# Patient Record
Sex: Male | Born: 1975 | Race: White | Hispanic: No | Marital: Married | Smoking: Current every day smoker
Health system: Southern US, Community
[De-identification: ages and names within clinical notes are randomized; demographics above are authoritative.]

## PROBLEM LIST (undated history)

## (undated) DIAGNOSIS — I509 Heart failure, unspecified: Secondary | ICD-10-CM

## (undated) DIAGNOSIS — G4730 Sleep apnea, unspecified: Secondary | ICD-10-CM

## (undated) DIAGNOSIS — I429 Cardiomyopathy, unspecified: Secondary | ICD-10-CM

## (undated) DIAGNOSIS — E890 Postprocedural hypothyroidism: Secondary | ICD-10-CM

## (undated) DIAGNOSIS — E7800 Pure hypercholesterolemia, unspecified: Secondary | ICD-10-CM

## (undated) DIAGNOSIS — I2510 Atherosclerotic heart disease of native coronary artery without angina pectoris: Secondary | ICD-10-CM

## (undated) DIAGNOSIS — E119 Type 2 diabetes mellitus without complications: Secondary | ICD-10-CM

## (undated) DIAGNOSIS — M109 Gout, unspecified: Secondary | ICD-10-CM

## (undated) DIAGNOSIS — E559 Vitamin D deficiency, unspecified: Secondary | ICD-10-CM

## (undated) DIAGNOSIS — J189 Pneumonia, unspecified organism: Secondary | ICD-10-CM

## (undated) DIAGNOSIS — T8859XA Other complications of anesthesia, initial encounter: Secondary | ICD-10-CM

## (undated) DIAGNOSIS — M1990 Unspecified osteoarthritis, unspecified site: Secondary | ICD-10-CM

## (undated) DIAGNOSIS — I10 Essential (primary) hypertension: Secondary | ICD-10-CM

## (undated) DIAGNOSIS — C801 Malignant (primary) neoplasm, unspecified: Secondary | ICD-10-CM

## (undated) DIAGNOSIS — E079 Disorder of thyroid, unspecified: Secondary | ICD-10-CM

## (undated) HISTORY — DX: Postprocedural hypothyroidism: E89.0

## (undated) HISTORY — DX: Cardiomyopathy, unspecified: I42.9

## (undated) HISTORY — DX: Disorder of thyroid, unspecified: E07.9

## (undated) HISTORY — PX: EYE SURGERY: SHX253

## (undated) HISTORY — DX: Vitamin D deficiency, unspecified: E55.9

## (undated) HISTORY — DX: Type 2 diabetes mellitus without complications: E11.9

## (undated) HISTORY — PX: TONSILLECTOMY: SUR1361

---
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
## 2012-11-25 ENCOUNTER — Ambulatory Visit: Payer: Self-pay | Admitting: Family Medicine

## 2012-11-29 ENCOUNTER — Other Ambulatory Visit: Payer: Self-pay | Admitting: *Deleted

## 2012-11-29 MED ORDER — ALLOPURINOL 300 MG PO TABS: 300.0000 mg | ORAL_TABLET | Freq: Every day | ORAL | Status: DC

## 2012-11-29 MED ORDER — ATORVASTATIN CALCIUM 20 MG PO TABS: 20.0000 mg | ORAL_TABLET | Freq: Every day | ORAL | Status: DC

## 2012-12-20 ENCOUNTER — Inpatient Hospital Stay (HOSPITAL_COMMUNITY): Payer: BC Managed Care – PPO

## 2012-12-20 ENCOUNTER — Inpatient Hospital Stay (HOSPITAL_COMMUNITY)
Admission: EM | Admit: 2012-12-20 | Discharge: 2012-12-22 | DRG: 316 | Disposition: A | Discharge status: Home / Self Care | Payer: BC Managed Care – PPO | Attending: Internal Medicine | Admitting: Internal Medicine

## 2012-12-20 ENCOUNTER — Encounter (HOSPITAL_COMMUNITY): Payer: Self-pay | Admitting: *Deleted

## 2012-12-20 DIAGNOSIS — J398 Other specified diseases of upper respiratory tract: Secondary | ICD-10-CM | POA: Diagnosis present

## 2012-12-20 DIAGNOSIS — R7309 Other abnormal glucose: Secondary | ICD-10-CM | POA: Diagnosis present

## 2012-12-20 DIAGNOSIS — Z6841 Body Mass Index (BMI) 40.0 and over, adult: Secondary | ICD-10-CM

## 2012-12-20 DIAGNOSIS — M109 Gout, unspecified: Secondary | ICD-10-CM | POA: Diagnosis present

## 2012-12-20 DIAGNOSIS — R739 Hyperglycemia, unspecified: Secondary | ICD-10-CM | POA: Diagnosis present

## 2012-12-20 DIAGNOSIS — E872 Acidosis, unspecified: Secondary | ICD-10-CM | POA: Diagnosis present

## 2012-12-20 DIAGNOSIS — IMO0001 Reserved for inherently not codable concepts without codable children: Secondary | ICD-10-CM | POA: Diagnosis present

## 2012-12-20 DIAGNOSIS — E041 Nontoxic single thyroid nodule: Secondary | ICD-10-CM | POA: Diagnosis present

## 2012-12-20 DIAGNOSIS — E0789 Other specified disorders of thyroid: Secondary | ICD-10-CM | POA: Diagnosis present

## 2012-12-20 DIAGNOSIS — E860 Dehydration: Secondary | ICD-10-CM | POA: Diagnosis present

## 2012-12-20 DIAGNOSIS — Z720 Tobacco use: Secondary | ICD-10-CM

## 2012-12-20 DIAGNOSIS — F17200 Nicotine dependence, unspecified, uncomplicated: Secondary | ICD-10-CM | POA: Diagnosis present

## 2012-12-20 DIAGNOSIS — I959 Hypotension, unspecified: Secondary | ICD-10-CM | POA: Diagnosis present

## 2012-12-20 DIAGNOSIS — D72829 Elevated white blood cell count, unspecified: Secondary | ICD-10-CM | POA: Diagnosis present

## 2012-12-20 DIAGNOSIS — N179 Acute kidney failure, unspecified: Principal | ICD-10-CM | POA: Diagnosis present

## 2012-12-20 DIAGNOSIS — E7800 Pure hypercholesterolemia, unspecified: Secondary | ICD-10-CM | POA: Diagnosis present

## 2012-12-20 DIAGNOSIS — E042 Nontoxic multinodular goiter: Secondary | ICD-10-CM

## 2012-12-20 DIAGNOSIS — Z808 Family history of malignant neoplasm of other organs or systems: Secondary | ICD-10-CM

## 2012-12-20 HISTORY — DX: Pure hypercholesterolemia, unspecified: E78.00

## 2012-12-20 HISTORY — DX: Gout, unspecified: M10.9

## 2012-12-20 LAB — COMPREHENSIVE METABOLIC PANEL
AST: 42 U/L — ABNORMAL HIGH (ref 0–37)
BUN: 21 mg/dL (ref 6–23)
Calcium: 13.4 mg/dL (ref 8.4–10.5)
Creatinine, Ser: 3.07 mg/dL — ABNORMAL HIGH (ref 0.50–1.35)
GFR calc Af Amer: 29 mL/min — ABNORMAL LOW (ref >90)
GFR calc non Af Amer: 25 mL/min — ABNORMAL LOW (ref >90)
Potassium: 4.2 mEq/L (ref 3.5–5.1)
Sodium: 138 mEq/L (ref 135–145)
Total Bilirubin: 1.1 mg/dL (ref 0.3–1.2)

## 2012-12-20 LAB — URINE MICROSCOPIC-ADD ON

## 2012-12-20 LAB — URINALYSIS, ROUTINE W REFLEX MICROSCOPIC
Bilirubin Urine: NEGATIVE
Nitrite: NEGATIVE
Protein, ur: 100 mg/dL — AB
pH: 5.5 (ref 5.0–8.0)

## 2012-12-20 LAB — CBC WITH DIFFERENTIAL/PLATELET
Basophils Relative: 0 % (ref 0–1)
Eosinophils Absolute: 0 10*3/uL (ref 0.0–0.7)
MCH: 33 pg (ref 26.0–34.0)
MCHC: 37 g/dL — ABNORMAL HIGH (ref 30.0–36.0)
Neutrophils Relative %: 78 % — ABNORMAL HIGH (ref 43–77)
Platelets: 316 10*3/uL (ref 150–400)

## 2012-12-20 LAB — CK: Total CK: 164 U/L (ref 7–232)

## 2012-12-20 LAB — PRO B NATRIURETIC PEPTIDE: Pro B Natriuretic peptide (BNP): 176.8 pg/mL — ABNORMAL HIGH (ref 0–125)

## 2012-12-20 IMAGING — CR DG CHEST 1V PORT
1 series · 1 of 1 positions shown · non-contrast
Comparison: None

CLINICAL DATA: Shortness of breath

PORTABLE CHEST - 1 VIEW

[portable]
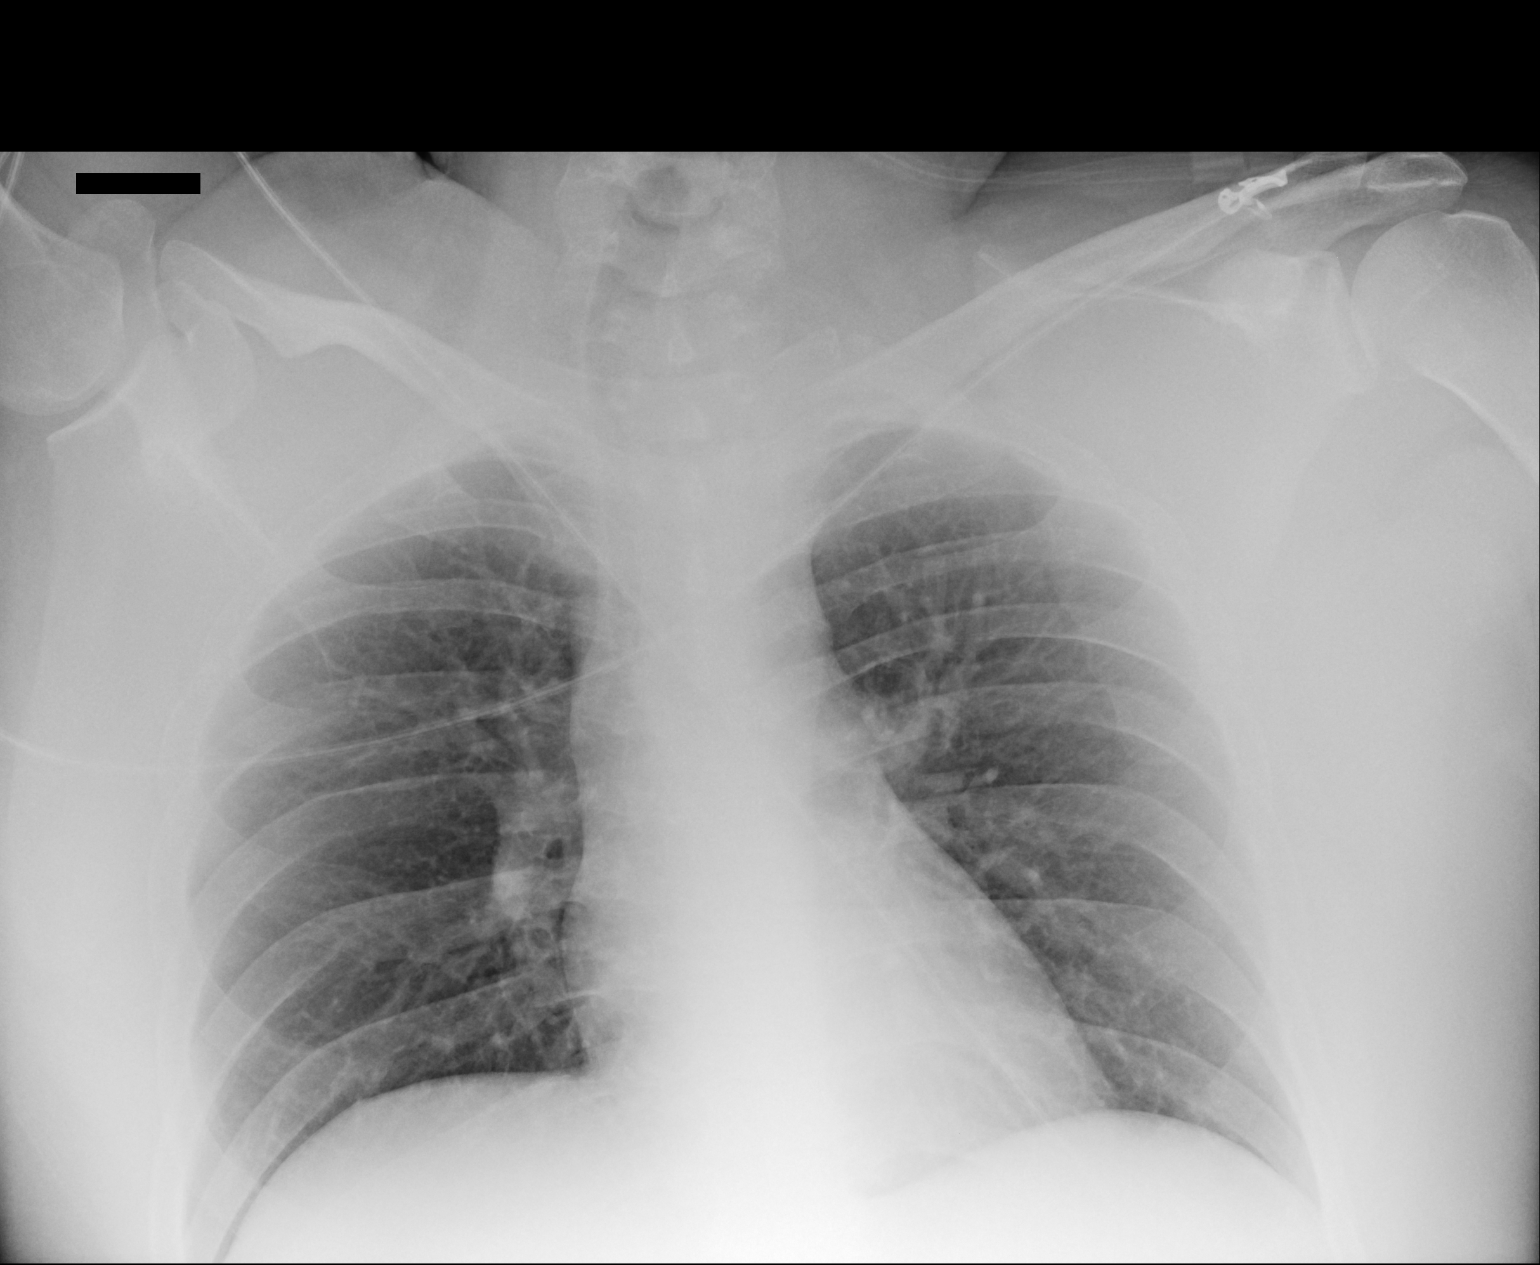

[1 of 1 positions shown; findings below may reference images not displayed]

FINDINGS: The heart, mediastinal, and hilar contours and pulmonary
vascularity are normal.  The right costophrenic angle is excluded
from the image.  The lungs are clear.  No visible pleural effusion.
Negative for pneumothorax.

The trachea is deviated to the right of midline.  Tracheal column
does not appear narrowed.
IMPRESSION: 1.  Deviation of the trachea to the right of midline.  There are no
prior studies for comparison.  Question the possibility of an
enlarged left thyroid lobe or other neck mass.
2.  No acute cardiopulmonary disease identified.

## 2012-12-20 MED ORDER — SODIUM CHLORIDE 0.9 % IV BOLUS (SEPSIS): 1000.0000 mL | Freq: Once | INTRAVENOUS | Status: DC

## 2012-12-20 MED ORDER — SODIUM CHLORIDE 0.9 % IV SOLN
1000.0000 mL | Freq: Once | INTRAVENOUS | Status: AC
  Administered 2012-12-20: 1000 mL via INTRAVENOUS

## 2012-12-20 MED ORDER — SODIUM CHLORIDE 0.9 % IV SOLN: INTRAVENOUS | Status: DC

## 2012-12-20 MED ORDER — SODIUM CHLORIDE 0.9 % IV BOLUS (SEPSIS)
1000.0000 mL | Freq: Once | INTRAVENOUS | Status: AC
  Administered 2012-12-20: 1000 mL via INTRAVENOUS

## 2012-12-20 MED ORDER — KETOROLAC TROMETHAMINE 30 MG/ML IJ SOLN
30.0000 mg | Freq: Once | INTRAMUSCULAR | Status: AC
  Administered 2012-12-20: 30 mg via INTRAVENOUS
  Filled 2012-12-20: qty 1

## 2012-12-20 MED ORDER — SODIUM BICARBONATE 8.4 % IV SOLN
INTRAVENOUS | Status: DC
  Administered 2012-12-20 – 2012-12-21 (×2): via INTRAVENOUS
  Filled 2012-12-20 (×9): qty 1000

## 2012-12-20 MED ORDER — SODIUM BICARBONATE 8.4 % IV SOLN
INTRAVENOUS | Status: AC
  Filled 2012-12-20: qty 150

## 2012-12-20 MED ORDER — SODIUM CHLORIDE 0.9 % IV SOLN
Freq: Once | INTRAVENOUS | Status: AC
  Administered 2012-12-20: 22:00:00 via INTRAVENOUS

## 2012-12-20 NOTE — ED Notes (Signed)
Pt c/o sob, cramping, emesis, and sob. Pt works for [employer]

## 2012-12-20 NOTE — ED Provider Notes (Signed)
History     This chart was scribed for Benny Lennert, MD, MD by Smitty Pluck, ED Scribe. The patient was seen in room APA06/APA06 and the patient's care was started at 6:07 PM.  CSN: [id number] Arrival date & time 12/20/12  1714    Chief Complaint  Patient presents with  . Shortness of Breath  . Abdominal Cramping  . Dizziness  . Emesis    Patient is a 37 y.o. male presenting with shortness of breath, cramps, and vomiting. The history is provided by the patient and medical records. No language interpreter was used.  Shortness of Breath Severity:  Moderate Duration:  3 hours Timing:  Constant Progression:  Unchanged Chronicity:  New Relieved by:  None tried Worsened by:  Nothing tried Ineffective treatments:  None tried Associated symptoms: vomiting   Associated symptoms: no abdominal pain, no chest pain, no cough, no headaches and no rash   Abdominal Cramping Associated symptoms include shortness of breath. Pertinent negatives include no chest pain, no abdominal pain and no headaches.  Emesis Associated symptoms: myalgias   Associated symptoms: no abdominal pain, no diarrhea and no headaches    HPI Comments: Tyler [name] is a 37 y.o. male who presents to the Emergency Department complaining of constant, moderate SOB with sudden onset 3 hours ago. He states that he was at work (working outside in 90 degree weather) when symptoms started. Pt reports having associated nausea, emesis and muscle cramping in right leg. Pt denies fever, chills, diarrhea, weakness, cough and any other pain.   PCP is Dr. Christell Constant   Past Medical History  Diagnosis Date  . High cholesterol   . Gout    Past Surgical History  Procedure Laterality Date  . Eye surgery     History reviewed. No pertinent family history. History  Substance Use Topics  . Smoking status: Current Every Day Smoker  . Smokeless tobacco: Not on file  . Alcohol Use: Yes    Review of Systems  Constitutional: Negative  for appetite change and fatigue.  HENT: Negative for congestion, sinus pressure and ear discharge.   Eyes: Negative for discharge.  Respiratory: Positive for shortness of breath. Negative for cough.   Cardiovascular: Negative for chest pain.  Gastrointestinal: Positive for nausea and vomiting. Negative for abdominal pain and diarrhea.  Genitourinary: Negative for frequency and hematuria.  Musculoskeletal: Positive for myalgias. Negative for back pain.  Skin: Negative for rash.  Neurological: Negative for seizures and headaches.  Psychiatric/Behavioral: Negative for hallucinations.    Allergies  Review of patient's allergies indicates no known allergies.  Home Medications   Current Outpatient Rx  Name  Route  Sig  Dispense  Refill  . allopurinol (ZYLOPRIM) 300 MG tablet   Oral   Take 1 tablet (300 mg total) by mouth daily.   30 tablet   0     Needs to be seen for 3 month follow up last seen 2 ...   . atorvastatin (LIPITOR) 20 MG tablet   Oral   Take 1 tablet (20 mg total) by mouth daily.   30 tablet   0     Needs to be seen for 3 month follow up last seen 2 ...    BP 79/49  Pulse 102  Temp(Src) 97.5 F (36.4 C)  Resp 21  Ht 5' 9.5" (1.765 m)  Wt 275 lb (124.739 kg)  BMI 40.04 kg/m2  SpO2 100% Physical Exam  Nursing note and vitals reviewed. Constitutional: He is oriented  to person, place, and time. He appears well-developed.  HENT:  Head: Normocephalic.  Eyes: Conjunctivae and EOM are normal. No scleral icterus.  Neck: Neck supple. No thyromegaly present.  Cardiovascular: Normal rate and regular rhythm.  Exam reveals no gallop and no friction rub.   No murmur heard. Pulmonary/Chest: No stridor. He has no wheezes. He has no rales. He exhibits no tenderness.  Abdominal: He exhibits no distension. There is no tenderness. There is no rebound.  Musculoskeletal: Normal range of motion. He exhibits no edema.  Lymphadenopathy:    He has no cervical adenopathy.   Neurological: He is oriented to person, place, and time. Coordination normal.  Skin: No rash noted. No erythema.  Psychiatric: He has a normal mood and affect. His behavior is normal.    ED Course  Procedures (including critical care time) DIAGNOSTIC STUDIES: Oxygen Saturation is 100% on [place], normal by my interpretation.    COORDINATION OF CARE: 6:10 PM Discussed ED treatment with pt and pt agrees.  Medications  0.9 %  sodium chloride infusion (0 mLs Intravenous Stopped 12/20/12 2013)    Followed by  0.9 %  sodium chloride infusion (0 mLs Intravenous Stopped 12/20/12 1935)  sodium chloride 0.9 % bolus 1,000 mL (0 mLs Intravenous Stopped 12/20/12 1935)  ketorolac (TORADOL) 30 MG/ML injection 30 mg (30 mg Intravenous Given 12/20/12 1819)    8:07 PM Recheck: Discussed lab results and treatment course with pt. Pt reports that he is feeling better. Will order CT neck w/o contrast due to chest xray results.    Labs Reviewed  CBC WITH DIFFERENTIAL - Abnormal; Notable for the following:    WBC 21.1 (*)    Hemoglobin 18.4 (*)    MCHC 37.0 (*)    Neutrophils Relative % 78 (*)    Neutro Abs 16.5 (*)    All other components within normal limits  COMPREHENSIVE METABOLIC PANEL - Abnormal; Notable for the following:    Chloride 91 (*)    Glucose, Bld 165 (*)    Creatinine, Ser 3.07 (*)    Calcium 13.4 (*)    Total Protein 10.6 (*)    Albumin 5.8 (*)    AST 42 (*)    ALT 74 (*)    Alkaline Phosphatase 121 (*)    GFR calc non Af Amer 25 (*)    GFR calc Af Amer 29 (*)    All other components within normal limits  PRO B NATRIURETIC PEPTIDE - Abnormal; Notable for the following:    Pro B Natriuretic peptide (BNP) 176.8 (*)    All other components within normal limits  GLUCOSE, CAPILLARY - Abnormal; Notable for the following:    Glucose-Capillary 160 (*)    All other components within normal limits  LACTIC ACID, PLASMA - Abnormal; Notable for the following:    Lactic Acid, Venous 4.6 (*)     All other components within normal limits  TROPONIN I  URINALYSIS, ROUTINE W REFLEX MICROSCOPIC   Ct Soft Tissue Neck Wo Contrast  12/20/2012   *RADIOLOGY REPORT*  Clinical Data: Shortness of breath.  Dehydration.  Deviation of the upper trachea to the right on a portable chest radiograph obtained earlier today, raising the possibility of an enlarged left thyroid lobe or other neck mass.  CT NECK WITHOUT CONTRAST  Technique:  Multidetector CT imaging of the neck was performed without intravenous contrast.  Comparison: Portable chest obtained earlier today.  Findings: Diffusely enlarged thyroid gland containing a small number of coarse calcifications.  The left lobe is significantly larger than the right lobe, measuring 7.0 x 5.3 cm on image number 77.  There is mild substernal extension on the left with tracheal deviation to the right.  There is also associated flattening of the trachea with mild airway narrowing.  No enlarged lymph nodes. Clear lung apices.  Unremarkable bones.  IMPRESSION: Large thyroid goiter with substernal extension on the left and tracheal deviation to the right with mild tracheal compression.   Original Report Authenticated By: Beckie Salts, M.D.   Dg Chest Portable 1 View  12/20/2012   *RADIOLOGY REPORT*  Clinical Data: Shortness of breath  PORTABLE CHEST - 1 VIEW  Comparison: None  Findings: The heart, mediastinal, and hilar contours and pulmonary vascularity are normal.  The right costophrenic angle is excluded from the image.  The lungs are clear.  No visible pleural effusion. Negative for pneumothorax.  The trachea is deviated to the right of midline.  Tracheal column does not appear narrowed.  IMPRESSION:  1.  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  There are no prior studies for comparison.  Question the possibility of an enlarged left thyroid lobe or other neck mass. 2.  No acute cardiopulmonary disease identified.   Original Report Authenticated By: Britta Mccreedy, M.D.    No diagnosis found.  Date: 12/20/2012  Rate:118  Rhythm: sinus tachycardia  QRS Axis: normal  Intervals: normal  ST/T Wave abnormalities: normal  Conduction Disutrbances:none  Narrative Interpretation:   Old EKG Reviewed: none available CRITICAL CARE Performed by: Shaka Zech L Total critical care time: 35  Critical care time was exclusive of separately billable procedures and treating other patients. Critical care was necessary to treat or prevent imminent or life-threatening deterioration. Critical care was time spent personally by me on the following activities: development of treatment plan with patient and/or surrogate as well as nursing, discussions with consultants, evaluation of patient's response to treatment, examination of patient, obtaining history from patient or surrogate, ordering and performing treatments and interventions, ordering and review of laboratory studies, ordering and review of radiographic studies, pulse oximetry and re-evaluation of patient's condition.    MDM   The chart was scribed for me under my direct supervision.  I personally performed the history, physical, and medical decision making and all procedures in the evaluation of this patient..'   Benny Lennert, MD 12/20/12 2153

## 2012-12-20 NOTE — ED Notes (Signed)
Lung fields clear prior to fluid administration.

## 2012-12-20 NOTE — H&P (Signed)
[facility] History and Physical  Tyler [name]  ZOX:[id number]  DOB: 1976/02/10   DOA: 12/20/2012   PCP:   [facility]  Chief Complaint:  Acute dizziness   HPI: Tyler [name] is a 37 y.o. male.  Obese Caucasian gentleman, works as a Copywriter, advertising for [employer], has been working out in the sun since 7 AM this morning at about 3 PM got suddenly very dizzy and had nausea and vomiting and was transported to the and the pain emergency room was found to be hypotensive extremely dehydrated and blood work confirmed extreme dehydration and acute renal failure. He received 4 L of IV fluids and the hospitalist service was called to assist with management   Rewiew of Systems:   All systems negative except as marked bold or noted in the HPI;  Constitutional:    malaise, fever and chills. ;  Eyes:   eye pain, redness and discharge. ;  ENMT:   ear pain, hoarseness, nasal congestion, sinus pressure and sore throat. ;  Cardiovascular:    chest pain, palpitations, diaphoresis, dyspnea and peripheral edema.  Respiratory:   cough, hemoptysis, wheezing and stridor. ;  Gastrointestinal:  nausea, vomiting, diarrhea, constipation, abdominal pain, melena, blood in stool, hematemesis, jaundice and rectal bleeding. unusual weight loss..   Genitourinary:    frequency, dysuria, incontinence,flank pain and hematuria; Musculoskeletal:   back pain and neck pain.  swelling and trauma.;  Skin: .  pruritus, rash, abrasions, bruising and skin lesion.; ulcerations Neuro:    headache, lightheadedness and neck stiffness.  weakness, altered level of consciousness, altered mental status, extremity weakness, burning feet, involuntary movement, seizure and syncope.  Psych:    anxiety, depression, insomnia, tearfulness, panic attacks, hallucinations, paranoia, suicidal or homicidal ideation    Past Medical History  Diagnosis Date  . High cholesterol   . Gout     Past Surgical History   Procedure Laterality Date  . Eye surgery      Medications:  HOME MEDS: Prior to Admission medications   Medication Sig Start Date End Date Taking? Authorizing Provider  allopurinol (ZYLOPRIM) 300 MG tablet Take 300 mg by mouth every morning. 11/29/12  Yes Ileana Ladd, MD  atorvastatin (LIPITOR) 20 MG tablet Take 20 mg by mouth every morning. 11/29/12  Yes Ileana Ladd, MD  fish oil-omega-3 fatty acids 1000 MG capsule Take 1 g by mouth daily.   Yes Historical Provider, MD     Allergies:  No Known Allergies  Social History:   reports that he has been smoking.  He does not have any smokeless tobacco history on file. He reports that  drinks alcohol. He reports that he does not use illicit drugs.  Family History: History reviewed. No pertinent family history.   Physical Exam: Filed Vitals:   12/20/12 2010 12/20/12 2015 12/20/12 2030 12/20/12 2045  BP: 136/97 133/90 130/78 141/93  Pulse: 86 88 85 89  Temp:      Resp: 16 14 17 17   Height:      Weight:      SpO2: 99% 99% 98% 98%   Blood pressure 141/93, pulse 89, temperature 97.5 F (36.4 C), resp. rate 17, height 5' 9.5" (1.765 m), weight 124.739 kg (275 lb), SpO2 98.00%. Body mass index is 40.04 kg/(m^2).   GEN:  Pleasant obese Caucasian gentleman lying bed in no acute distress; cooperative with exam PSYCH:  alert and oriented x4; ; affect is appropriate. HEENT: Mucous membranes pink and anicteric; PERRLA;  EOM intact;r large goiter thyromegaly Breasts:: Not examined CHEST WALL: No tenderness CHEST: Normal respiration, clear to auscultation bilaterally HEART: Regular rate and rhythm; no murmurs rubs or gallops BACK: Buffalo hump  no CVA tenderness ABDOMEN: Obese, soft non-tender; no masses, s; no intertriginous candida. Rectal Exam: Not done EXTREMITIES: No bone or joint deformity;  no edema; no ulcerations. Genitalia: not examined PULSES: 2+ and symmetric SKIN: Almost Sunburned skin CNS: Cranial nerves 2-12 grossly  intact no focal lateralizing neurologic deficit   Labs on Admission:  Basic Metabolic Panel:  Recent Labs Lab 12/20/12 1750  NA 138  K 4.2  CL 91*  CO2 19  GLUCOSE 165*  BUN 21  CREATININE 3.07*  CALCIUM 13.4*   Liver Function Tests:  Recent Labs Lab 12/20/12 1750  AST 42*  ALT 74*  ALKPHOS 121*  BILITOT 1.1  PROT 10.6*  ALBUMIN 5.8*   No results found for this basename: LIPASE, AMYLASE,  in the last 168 hours No results found for this basename: AMMONIA,  in the last 168 hours CBC:  Recent Labs Lab 12/20/12 1750  WBC 21.1*  NEUTROABS 16.5*  HGB 18.4*  HCT 49.7  MCV 89.1  PLT 316   Cardiac Enzymes:  Recent Labs Lab 12/20/12 1750  TROPONINI <0.30   BNP: No components found with this basename: POCBNP,  D-dimer: No components found with this basename: D-DIMER,  CBG:  Recent Labs Lab 12/20/12 1750  GLUCAP 160*    Radiological Exams on Admission: Ct Soft Tissue Neck Wo Contrast  12/20/2012   *RADIOLOGY REPORT*  Clinical Data: Shortness of breath.  Dehydration.  Deviation of the upper trachea to the right on a portable chest radiograph obtained earlier today, raising the possibility of an enlarged left thyroid lobe or other neck mass.  CT NECK WITHOUT CONTRAST  Technique:  Multidetector CT imaging of the neck was performed without intravenous contrast.  Comparison: Portable chest obtained earlier today.  Findings: Diffusely enlarged thyroid gland containing a small number of coarse calcifications.  The left lobe is significantly larger than the right lobe, measuring 7.0 x 5.3 cm on image number 77.  There is mild substernal extension on the left with tracheal deviation to the right.  There is also associated flattening of the trachea with mild airway narrowing.  No enlarged lymph nodes. Clear lung apices.  Unremarkable bones.  IMPRESSION: Large thyroid goiter with substernal extension on the left and tracheal deviation to the right with mild tracheal  compression.   Original Report Authenticated By: Beckie Salts, M.D.   Dg Chest Portable 1 View  12/20/2012   *RADIOLOGY REPORT*  Clinical Data: Shortness of breath  PORTABLE CHEST - 1 VIEW  Comparison: None  Findings: The heart, mediastinal, and hilar contours and pulmonary vascularity are normal.  The right costophrenic angle is excluded from the image.  The lungs are clear.  No visible pleural effusion. Negative for pneumothorax.  The trachea is deviated to the right of midline.  Tracheal column does not appear narrowed.  IMPRESSION:  1.  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  There are no prior studies for comparison.  Question the possibility of an enlarged left thyroid lobe or other neck mass. 2.  No acute cardiopulmonary disease identified.   Original Report Authenticated By: Britta Mccreedy, M.D.       Assessment/Plan  Principal Problem:   ARF (acute renal failure) Active Problems:   Dehydration   Hypotension   Severe obesity (BMI >= 40)  Hyperglycemia   Lactic acid acidosis   Tobacco abuse Thyroid nodules   Motivation and readiness for weight loss intervention Body mass index is 40.04 kg/(m^2). Not actively planning any intervention total lose weight; because you his work involves a lot of traveling he eats out a lot and says he therefore cannot diet; Exercise on weekend ; too tiredto exercise after the work day. We discussed with him the importance of preferring fruits and vegetables over protein and starch on the plate 40/98/11. We suggested like to exercise for 5 or 10 minutes each evening after work, which can be gradually increased to improve his exercise tolerance, and he may then not be so fatigued at the end of the day   PLAN: We'll admit this gentleman for vigorous hydration, but will initially start with a bicarbonate fluid, reevaluate in the morning. He did receive a dose of Toradol in the emergency room so we will be particularly aggressive with his  hydration Will monitor for hypo kalemia as we hydrate. Repeat lactic acid in a few hours  Other plans as per orders.  Code Status: Full Family Communication: Plans discuss with patient  Disposition Plan: Likely home in a day or 2    Victoriana Aziz Nocturnist [facility] Pager [phone]   12/20/2012, 9:53 PM

## 2012-12-20 NOTE — ED Notes (Signed)
Admission order error. Corrected by EDP.

## 2012-12-20 NOTE — ED Notes (Signed)
CRITICAL VALUE ALERT  Critical value received:  Calcium 13.4  Date of notification:  12/20/12  Time of notification:  1846  Critical value read back:yes  Nurse who received alert:  Lawernce Ion  MD notified (1st page):  [phone]  Time of first page:  1847  MD notified (2nd page):  Time of second page:  Responding MD:  Estell Harpin  Time MD responded:  [phone]

## 2012-12-20 NOTE — ED Notes (Signed)
Pt cold and clammy with elevated HR 120, BP 72/40, pt cramping all over, pt states he has been in the sun all day, has drank 3l water and 24 oz Gatorade and only urinated once.

## 2012-12-21 ENCOUNTER — Inpatient Hospital Stay (HOSPITAL_COMMUNITY): Payer: BC Managed Care – PPO

## 2012-12-21 ENCOUNTER — Encounter (HOSPITAL_COMMUNITY): Payer: Self-pay | Admitting: Internal Medicine

## 2012-12-21 DIAGNOSIS — F17200 Nicotine dependence, unspecified, uncomplicated: Secondary | ICD-10-CM | POA: Diagnosis present

## 2012-12-21 DIAGNOSIS — E872 Acidosis, unspecified: Secondary | ICD-10-CM | POA: Diagnosis present

## 2012-12-21 DIAGNOSIS — I959 Hypotension, unspecified: Secondary | ICD-10-CM

## 2012-12-21 DIAGNOSIS — Z720 Tobacco use: Secondary | ICD-10-CM | POA: Diagnosis present

## 2012-12-21 LAB — LACTIC ACID, PLASMA: Lactic Acid, Venous: 1.1 mmol/L (ref 0.5–2.2)

## 2012-12-21 LAB — T4, FREE: Free T4: 1.94 ng/dL — ABNORMAL HIGH (ref 0.80–1.80)

## 2012-12-21 LAB — CBC
MCH: 31.8 pg (ref 26.0–34.0)
MCV: 91.4 fL (ref 78.0–100.0)
Platelets: 209 10*3/uL (ref 150–400)
RDW: 13 % (ref 11.5–15.5)

## 2012-12-21 LAB — COMPREHENSIVE METABOLIC PANEL
Albumin: 3.4 g/dL — ABNORMAL LOW (ref 3.5–5.2)
BUN: 23 mg/dL (ref 6–23)
Creatinine, Ser: 1.65 mg/dL — ABNORMAL HIGH (ref 0.50–1.35)
GFR calc Af Amer: 61 mL/min — ABNORMAL LOW (ref >90)
Glucose, Bld: 121 mg/dL — ABNORMAL HIGH (ref 70–99)
Total Bilirubin: 0.6 mg/dL (ref 0.3–1.2)
Total Protein: 6.6 g/dL (ref 6.0–8.3)

## 2012-12-21 MED ORDER — ACETAMINOPHEN 650 MG RE SUPP: 650.0000 mg | Freq: Four times a day (QID) | RECTAL | Status: DC | PRN

## 2012-12-21 MED ORDER — BISACODYL 10 MG RE SUPP: 10.0000 mg | Freq: Every day | RECTAL | Status: DC | PRN

## 2012-12-21 MED ORDER — OXYCODONE HCL 5 MG PO TABS: 5.0000 mg | ORAL_TABLET | ORAL | Status: DC | PRN

## 2012-12-21 MED ORDER — POTASSIUM CHLORIDE IN NACL 20-0.9 MEQ/L-% IV SOLN
INTRAVENOUS | Status: DC
  Administered 2012-12-21 – 2012-12-22 (×4): via INTRAVENOUS

## 2012-12-21 MED ORDER — DOCUSATE SODIUM 100 MG PO CAPS
100.0000 mg | ORAL_CAPSULE | Freq: Two times a day (BID) | ORAL | Status: DC
  Administered 2012-12-21 – 2012-12-22 (×3): 100 mg via ORAL
  Filled 2012-12-21 (×3): qty 1

## 2012-12-21 MED ORDER — ATORVASTATIN CALCIUM 20 MG PO TABS
20.0000 mg | ORAL_TABLET | Freq: Every morning | ORAL | Status: DC
  Administered 2012-12-21 – 2012-12-22 (×2): 20 mg via ORAL
  Filled 2012-12-21 (×2): qty 1

## 2012-12-21 MED ORDER — ACETAMINOPHEN 325 MG PO TABS: 650.0000 mg | ORAL_TABLET | Freq: Four times a day (QID) | ORAL | Status: DC | PRN

## 2012-12-21 MED ORDER — SODIUM CHLORIDE 0.9 % IJ SOLN
3.0000 mL | Freq: Two times a day (BID) | INTRAMUSCULAR | Status: DC
  Administered 2012-12-21: 3 mL via INTRAVENOUS

## 2012-12-21 MED ORDER — OMEGA-3-ACID ETHYL ESTERS 1 G PO CAPS
1.0000 g | ORAL_CAPSULE | Freq: Every day | ORAL | Status: DC
  Administered 2012-12-21 – 2012-12-22 (×2): 1 g via ORAL
  Filled 2012-12-21 (×2): qty 1

## 2012-12-21 MED ORDER — POTASSIUM CHLORIDE IN NACL 20-0.9 MEQ/L-% IV SOLN: INTRAVENOUS | Status: DC

## 2012-12-21 MED ORDER — SODIUM BICARBONATE 8.4 % IV SOLN
INTRAVENOUS | Status: AC
  Filled 2012-12-21: qty 150

## 2012-12-21 MED ORDER — HEPARIN SODIUM (PORCINE) 5000 UNIT/ML IJ SOLN
5000.0000 [IU] | Freq: Three times a day (TID) | INTRAMUSCULAR | Status: DC
  Administered 2012-12-21 (×3): 5000 [IU] via SUBCUTANEOUS
  Filled 2012-12-21 (×3): qty 1

## 2012-12-21 MED ORDER — FLEET ENEMA 7-19 GM/118ML RE ENEM: 1.0000 | ENEMA | Freq: Once | RECTAL | Status: AC | PRN

## 2012-12-21 NOTE — Progress Notes (Signed)
UR chart review completed.  

## 2012-12-21 NOTE — Progress Notes (Signed)
Received a call from Dr. Orvan Falconer about midnight labs not being done.  Contacted the lab.  They stated they discontinued the labs thinking it was a duplicate order.  MD notified.

## 2012-12-21 NOTE — Consult Note (Signed)
Reason for Consult: Thyromegaly Referring Physician: [facility]  Tyler [name] is an 37 y.o. male.  HPI: Patient is a 37 year old white male who was admitted through the emergency room for treatment of renal insufficiency and dehydration who was found on physical examination to have thyromegaly. CT scan of the neck was performed which revealed a significantly enlarged thyroid gland, especially on the left side with tracheal deviation to the right and mild tracheal compression. The patient states he has had no voice changes. He denies any air hunger while lying flat. He denies any radiation to the neck. He denies any heart palpitations. He states he does have a grandmother who had thyroid cancer. No other family members have been diagnosed with thyroid disease.  Past Medical History  Diagnosis Date  . High cholesterol   . Gout     Past Surgical History  Procedure Laterality Date  . Eye surgery      History reviewed. No pertinent family history.  Social History:  reports that he has been smoking.  He does not have any smokeless tobacco history on file. He reports that  drinks alcohol. He reports that he does not use illicit drugs.  Allergies: No Known Allergies  Medications: I have reviewed the patient's current medications.  Results for orders placed during the hospital encounter of 12/20/12 (from the past 48 hour(s))  CBC WITH DIFFERENTIAL     Status: Abnormal   Collection Time    12/20/12  5:50 PM      Result Value Range   WBC 21.1 (*) 4.0 - 10.5 K/uL   RBC 5.58  4.22 - 5.81 MIL/uL   Hemoglobin 18.4 (*) 13.0 - 17.0 g/dL   HCT 13.0  86.5 - 78.4 %   MCV 89.1  78.0 - 100.0 fL   MCH 33.0  26.0 - 34.0 pg   MCHC 37.0 (*) 30.0 - 36.0 g/dL   RDW 69.6  29.5 - 28.4 %   Platelets 316  150 - 400 K/uL   Neutrophils Relative % 78 (*) 43 - 77 %   Neutro Abs 16.5 (*) 1.7 - 7.7 K/uL   Lymphocytes Relative 17  12 - 46 %   Lymphs Abs 3.5  0.7 - 4.0 K/uL   Monocytes Relative 5  3 -  12 %   Monocytes Absolute 1.0  0.1 - 1.0 K/uL   Eosinophils Relative 0  0 - 5 %   Eosinophils Absolute 0.0  0.0 - 0.7 K/uL   Basophils Relative 0  0 - 1 %   Basophils Absolute 0.0  0.0 - 0.1 K/uL  COMPREHENSIVE METABOLIC PANEL     Status: Abnormal   Collection Time    12/20/12  5:50 PM      Result Value Range   Sodium 138  135 - 145 mEq/L   Potassium 4.2  3.5 - 5.1 mEq/L   Chloride 91 (*) 96 - 112 mEq/L   CO2 19  19 - 32 mEq/L   Glucose, Bld 165 (*) 70 - 99 mg/dL   BUN 21  6 - 23 mg/dL   Creatinine, Ser 1.32 (*) 0.50 - 1.35 mg/dL   Calcium 44.0 (*) 8.4 - 10.5 mg/dL   Comment: CRITICAL RESULT CALLED TO, READ BACK BY AND VERIFIED WITH:     G. MEADOWS AT 1847 ON 12/20/12 BY S. VANHOORNE   Total Protein 10.6 (*) 6.0 - 8.3 g/dL   Albumin 5.8 (*) 3.5 - 5.2 g/dL   AST 42 (*) 0 -  37 U/L   ALT 74 (*) 0 - 53 U/L   Alkaline Phosphatase 121 (*) 39 - 117 U/L   Total Bilirubin 1.1  0.3 - 1.2 mg/dL   GFR calc non Af Amer 25 (*) >90 mL/min   GFR calc Af Amer 29 (*) >90 mL/min   Comment:            The eGFR has been calculated     using the CKD EPI equation.     This calculation has not been     validated in all clinical     situations.     eGFR's persistently     <90 mL/min signify     possible Chronic Kidney Disease.  TROPONIN I     Status: None   Collection Time    12/20/12  5:50 PM      Result Value Range   Troponin I <0.30  <0.30 ng/mL   Comment:            Due to the release kinetics of cTnI,     a negative result within the first hours     of the onset of symptoms does not rule out     myocardial infarction with certainty.     If myocardial infarction is still suspected,     repeat the test at appropriate intervals.  PRO B NATRIURETIC PEPTIDE     Status: Abnormal   Collection Time    12/20/12  5:50 PM      Result Value Range   Pro B Natriuretic peptide (BNP) 176.8 (*) 0 - 125 pg/mL  GLUCOSE, CAPILLARY     Status: Abnormal   Collection Time    12/20/12  5:50 PM      Result  Value Range   Glucose-Capillary 160 (*) 70 - 99 mg/dL  LACTIC ACID, PLASMA     Status: Abnormal   Collection Time    12/20/12  6:20 PM      Result Value Range   Lactic Acid, Venous 4.6 (*) 0.5 - 2.2 mmol/L  CK     Status: None   Collection Time    12/20/12  9:49 PM      Result Value Range   Total CK 164  7 - 232 U/L  URINALYSIS, ROUTINE W REFLEX MICROSCOPIC     Status: Abnormal   Collection Time    12/20/12  9:59 PM      Result Value Range   Color, Urine BROWN (*) YELLOW   Comment: BIOCHEMICALS MAY BE AFFECTED BY COLOR   APPearance HAZY (*) CLEAR   Specific Gravity, Urine >1.030 (*) 1.005 - 1.030   pH 5.5  5.0 - 8.0   Glucose, UA NEGATIVE  NEGATIVE mg/dL   Hgb urine dipstick NEGATIVE  NEGATIVE   Bilirubin Urine NEGATIVE  NEGATIVE   Ketones, ur TRACE (*) NEGATIVE mg/dL   Protein, ur 161 (*) NEGATIVE mg/dL   Urobilinogen, UA 0.2  0.0 - 1.0 mg/dL   Nitrite NEGATIVE  NEGATIVE   Leukocytes, UA NEGATIVE  NEGATIVE  URINE MICROSCOPIC-ADD ON     Status: Abnormal   Collection Time    12/20/12  9:59 PM      Result Value Range   WBC, UA 0-2  <3 WBC/hpf   RBC / HPF 0-2  <3 RBC/hpf   Bacteria, UA MANY (*) RARE   Casts HYALINE CASTS (*) NEGATIVE   Comment: WAXY CAST  LACTIC ACID, PLASMA     Status: None  Collection Time    12/21/12 12:22 AM      Result Value Range   Lactic Acid, Venous 1.1  0.5 - 2.2 mmol/L  CBC     Status: Abnormal   Collection Time    12/21/12  5:15 AM      Result Value Range   WBC 13.1 (*) 4.0 - 10.5 K/uL   RBC 4.09 (*) 4.22 - 5.81 MIL/uL   Hemoglobin 13.0  13.0 - 17.0 g/dL   Comment: DELTA CHECK NOTED     RESULT REPEATED AND VERIFIED   HCT 37.4 (*) 39.0 - 52.0 %   MCV 91.4  78.0 - 100.0 fL   MCH 31.8  26.0 - 34.0 pg   MCHC 34.8  30.0 - 36.0 g/dL   RDW 16.1  09.6 - 04.5 %   Platelets 209  150 - 400 K/uL   Comment: DELTA CHECK NOTED  COMPREHENSIVE METABOLIC PANEL     Status: Abnormal   Collection Time    12/21/12  5:15 AM      Result Value Range    Sodium 139  135 - 145 mEq/L   Potassium 3.5  3.5 - 5.1 mEq/L   Chloride 99  96 - 112 mEq/L   Comment: DELTA CHECK NOTED   CO2 30  19 - 32 mEq/L   Glucose, Bld 121 (*) 70 - 99 mg/dL   BUN 23  6 - 23 mg/dL   Creatinine, Ser 4.09 (*) 0.50 - 1.35 mg/dL   Calcium 9.0  8.4 - 81.1 mg/dL   Total Protein 6.6  6.0 - 8.3 g/dL   Albumin 3.4 (*) 3.5 - 5.2 g/dL   AST 24  0 - 37 U/L   ALT 41  0 - 53 U/L   Alkaline Phosphatase 68  39 - 117 U/L   Total Bilirubin 0.6  0.3 - 1.2 mg/dL   GFR calc non Af Amer 53 (*) >90 mL/min   GFR calc Af Amer 61 (*) >90 mL/min   Comment:            The eGFR has been calculated     using the CKD EPI equation.     This calculation has not been     validated in all clinical     situations.     eGFR's persistently     <90 mL/min signify     possible Chronic Kidney Disease.    Ct Soft Tissue Neck Wo Contrast  12/20/2012   *RADIOLOGY REPORT*  Clinical Data: Shortness of breath.  Dehydration.  Deviation of the upper trachea to the right on a portable chest radiograph obtained earlier today, raising the possibility of an enlarged left thyroid lobe or other neck mass.  CT NECK WITHOUT CONTRAST  Technique:  Multidetector CT imaging of the neck was performed without intravenous contrast.  Comparison: Portable chest obtained earlier today.  Findings: Diffusely enlarged thyroid gland containing a small number of coarse calcifications.  The left lobe is significantly larger than the right lobe, measuring 7.0 x 5.3 cm on image number 77.  There is mild substernal extension on the left with tracheal deviation to the right.  There is also associated flattening of the trachea with mild airway narrowing.  No enlarged lymph nodes. Clear lung apices.  Unremarkable bones.  IMPRESSION: Large thyroid goiter with substernal extension on the left and tracheal deviation to the right with mild tracheal compression.   Original Report Authenticated By: Beckie Salts, M.D.   Dg Chest Portable  1  View  12/20/2012   *RADIOLOGY REPORT*  Clinical Data: Shortness of breath  PORTABLE CHEST - 1 VIEW  Comparison: None  Findings: The heart, mediastinal, and hilar contours and pulmonary vascularity are normal.  The right costophrenic angle is excluded from the image.  The lungs are clear.  No visible pleural effusion. Negative for pneumothorax.  The trachea is deviated to the right of midline.  Tracheal column does not appear narrowed.  IMPRESSION:  1.  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  There are no prior studies for comparison.  Question the possibility of an enlarged left thyroid lobe or other neck mass. 2.  No acute cardiopulmonary disease identified.   Original Report Authenticated By: Britta Mccreedy, M.D.    ROS: See chart Blood pressure 116/72, pulse 70, temperature 98.1 F (36.7 C), temperature source Oral, resp. rate 18, height 5\' 10"  (1.778 m), weight 121.428 kg (267 lb 11.2 oz), SpO2 95.00%. Physical Exam: Pleasant white male no acute distress. Neck: Easily palpable thyroid gland with left lobe much larger than right lobe. It was difficult to a certain a dominant mass in the left thyroid gland. Tracheal deviation to the right is noted. I was able to feel the inferior pole of the left thyroid gland. No lymphadenopathy was noted.  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 Patient has been otherwise asymptomatic. Plan: Thyroid function test pending. Will get ultrasound of thyroid gland. Should a specific nodule is seen, a fine-needle aspiration will be performed. He will definitely need his thyroid gland removed, the extent of which is not known at this time. This can be done as an outpatient.  Dondre Catalfamo A 12/21/2012, 11:33 AM

## 2012-12-21 NOTE — Progress Notes (Signed)
[facility] PROGRESS NOTE  Tyler [name] HQI:[id number] DOB: 04-14-1976 DOA: 12/20/2012 PCP: No PCP Per Patient  HPI: Tyler [name] is a 37 y.o. male. Obese Caucasian gentleman, works as a Copywriter, advertising for [employer], has been working out in the sun since 7 AM this morning at about 3 PM got suddenly very dizzy and had nausea and vomiting and was transported to the and the pain emergency room was found to be hypotensive extremely dehydrated and blood work confirmed extreme dehydration and acute renal failure. He received 4 L of IV fluids and the hospitalist service was called to assist with management  Assessment/Plan:  Dehydration Acute renal failure Obesity Leukocytosis Lactic acidosis Thyroid mass  Multiple lab abnormalities due to severe dehydration. Lactic acid improved, AKI improved, still not normal. CT neck with significant thyroid enlargement in need of surgical management, surgery has been consulted and appreciate their input. Continue IV hydration today, obtain US thyroid, TFTs and HBA1C. Likely home tomorrow.   Code Status: Full Family Communication: none  Disposition Plan: home 1 day  Consultants:  Surgery  Procedures:  none  Antibiotics:  Anti-infectives   None     Antibiotics Given (last 72 hours)   None      HPI/Subjective: - feels well, at baseline. No shortness of breath; he was able to eat this morning without problems.   Objective: Filed Vitals:   12/21/12 0202 12/21/12 0409 12/21/12 0500 12/21/12 0519  BP: 112/67   116/72  Pulse: 78   70  Temp: 97.9 F (36.6 C)   98.1 F (36.7 C)  TempSrc: Oral   Oral  Resp: 18   18  Height:      Weight:  121.428 kg (267 lb 11.2 oz) 121.428 kg (267 lb 11.2 oz)   SpO2: 97%   95%    Intake/Output Summary (Last 24 hours) at 12/21/12 1136 Last data filed at 12/21/12 1029  Gross per 24 hour  Intake   1580 ml  Output    380 ml  Net   1200 ml   Filed Weights   12/20/12 2304 12/21/12 0409  12/21/12 0500  Weight: 120.158 kg (264 lb 14.4 oz) 121.428 kg (267 lb 11.2 oz) 121.428 kg (267 lb 11.2 oz)    Exam:   General:  NAD, obese caucasian male  ENT: significant bilateral thyroid enlargement easily palpable, L>R. Non tender.   Cardiovascular: regular rate and rhythm, without MRG  Respiratory: good air movement, clear to auscultation throughout, no wheezing, ronchi or rales  Abdomen: soft, not tender to palpation, positive bowel sounds  MSK: no peripheral edema  Neuro: CN 2-12 grossly intact, MS 5/5 in all 4  Data Reviewed: Basic Metabolic Panel:  Recent Labs Lab 12/20/12 1750 12/21/12 0515  NA 138 139  K 4.2 3.5  CL 91* 99  CO2 19 30  GLUCOSE 165* 121*  BUN 21 23  CREATININE 3.07* 1.65*  CALCIUM 13.4* 9.0   Liver Function Tests:  Recent Labs Lab 12/20/12 1750 12/21/12 0515  AST 42* 24  ALT 74* 41  ALKPHOS 121* 68  BILITOT 1.1 0.6  PROT 10.6* 6.6  ALBUMIN 5.8* 3.4*   CBC:  Recent Labs Lab 12/20/12 1750 12/21/12 0515  WBC 21.1* 13.1*  NEUTROABS 16.5*  --   HGB 18.4* 13.0  HCT 49.7 37.4*  MCV 89.1 91.4  PLT 316 209   Cardiac Enzymes:  Recent Labs Lab 12/20/12 1750 12/20/12 2149  CKTOTAL  --  164  TROPONINI <0.30  --  BNP (last 3 results)  Recent Labs  12/20/12 1750  PROBNP 176.8*   CBG:  Recent Labs Lab 12/20/12 1750  GLUCAP 160*   Studies: Ct Soft Tissue Neck Wo Contrast  12/20/2012   *RADIOLOGY REPORT*  Clinical Data: Shortness of breath.  Dehydration.  Deviation of the upper trachea to the right on a portable chest radiograph obtained earlier today, raising the possibility of an enlarged left thyroid lobe or other neck mass.  CT NECK WITHOUT CONTRAST  Technique:  Multidetector CT imaging of the neck was performed without intravenous contrast.  Comparison: Portable chest obtained earlier today.  Findings: Diffusely enlarged thyroid gland containing a small number of coarse calcifications.  The left lobe is  significantly larger than the right lobe, measuring 7.0 x 5.3 cm on image number 77.  There is mild substernal extension on the left with tracheal deviation to the right.  There is also associated flattening of the trachea with mild airway narrowing.  No enlarged lymph nodes. Clear lung apices.  Unremarkable bones.  IMPRESSION: Large thyroid goiter with substernal extension on the left and tracheal deviation to the right with mild tracheal compression.   Original Report Authenticated By: Beckie Salts, M.D.   Dg Chest Portable 1 View  12/20/2012   *RADIOLOGY REPORT*  Clinical Data: Shortness of breath  PORTABLE CHEST - 1 VIEW  Comparison: None  Findings: The heart, mediastinal, and hilar contours and pulmonary vascularity are normal.  The right costophrenic angle is excluded from the image.  The lungs are clear.  No visible pleural effusion. Negative for pneumothorax.  The trachea is deviated to the right of midline.  Tracheal column does not appear narrowed.  IMPRESSION:  1.  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  There are no prior studies for comparison.  Question the possibility of an enlarged left thyroid lobe or other neck mass. 2.  No acute cardiopulmonary disease identified.   Original Report Authenticated By: Britta Mccreedy, M.D.    Scheduled Meds: . atorvastatin  20 mg Oral q morning - 10a  . docusate sodium  100 mg Oral BID  . heparin  5,000 Units Subcutaneous Q8H  . omega-3 acid ethyl esters  1 g Oral Daily  . sodium chloride  3 mL Intravenous Q12H   Continuous Infusions: . 0.9 % NaCl with KCl 20 mEq / L 150 mL/hr at 12/21/12 9629    Principal Problem:   ARF (acute renal failure) Active Problems:   Dehydration   Hypotension   Severe obesity (BMI >= 40)   Hyperglycemia   Lactic acid acidosis   Tobacco abuse   Time spent: 52  Pamella Pert, MD [facility] Pager [phone]. If 7 PM - 7 AM, please contact night-coverage at [email or web address], password [facility] 12/21/2012,  11:36 AM  LOS: 1 day

## 2012-12-21 NOTE — Care Management Note (Signed)
    Page 1 of 1   12/21/2012     1:41:42 PM   CARE MANAGEMENT NOTE 12/21/2012  Patient:  Tyler [name], Tyler [name]   Account Number:  [id number]  Date Initiated:  12/21/2012  Documentation initiated by:  Sharrie Rothman  Subjective/Objective Assessment:   Pt admitted from home with dehydration. Pt lives with his grandmother and will return home at discharge. Pt is independent with ADL's.     Action/Plan:   No CM needs noted.   Anticipated DC Date:  12/22/2012   Anticipated DC Plan:  HOME/SELF CARE      DC Planning Services  CM consult      Choice offered to / List presented to:             Status of service:  Completed, signed off Medicare Important Message given?   (If response is "NO", the following Medicare IM given date fields will be blank) Date Medicare IM given:   Date Additional Medicare IM given:    Discharge Disposition:  HOME/SELF CARE  Per UR Regulation:    If discussed at Long Length of Stay Meetings, dates discussed:    Comments:  12/21/12 1340 Arlyss Queen, RN BSN CM

## 2012-12-22 ENCOUNTER — Inpatient Hospital Stay (HOSPITAL_COMMUNITY): Payer: BC Managed Care – PPO

## 2012-12-22 DIAGNOSIS — E042 Nontoxic multinodular goiter: Secondary | ICD-10-CM

## 2012-12-22 LAB — URINE CULTURE

## 2012-12-22 LAB — CBC
MCH: 31.4 pg (ref 26.0–34.0)
MCHC: 33.8 g/dL (ref 30.0–36.0)
MCV: 92.9 fL (ref 78.0–100.0)
Platelets: 187 10*3/uL (ref 150–400)
RDW: 13.1 % (ref 11.5–15.5)
WBC: 8.6 10*3/uL (ref 4.0–10.5)

## 2012-12-22 LAB — BASIC METABOLIC PANEL
BUN: 14 mg/dL (ref 6–23)
Calcium: 8.9 mg/dL (ref 8.4–10.5)
Creatinine, Ser: 0.93 mg/dL (ref 0.50–1.35)
GFR calc Af Amer: >90 mL/min (ref >90)

## 2012-12-22 MED ORDER — LIDOCAINE HCL (PF) 2 % IJ SOLN
INTRAMUSCULAR | Status: AC
  Filled 2012-12-22: qty 10

## 2012-12-22 NOTE — Progress Notes (Signed)
Lidocaine 2%         4mL injected                 Bilateral thyroid biopsies performed

## 2012-12-22 NOTE — Progress Notes (Signed)
Pt. D/c instructions reviewed, IV removed, medications reviewed, follow ups reviewed, pt. Advised to follow up with Dr. Lovell Sheehan within one week.

## 2012-12-22 NOTE — Progress Notes (Signed)
Ultrasound of thyroid report reviewed. To undergo fine-needle aspiration of the thyroid gland today. It is okay for him to be discharged from my standpoint. He should follow up in my office on 12/29/2012 to go over the results.

## 2012-12-22 NOTE — Discharge Summary (Signed)
Physician Discharge Summary  ADAMS HINCH ZOX:[id number] DOB: [birth year] DOA: 12/20/2012  PCP: No PCP Per Patient  Admit date: 12/20/2012 Discharge date: 12/22/2012  Time spent: 35 minutes  Recommendations for Outpatient Follow-up:  1. PCP in 1-2 weeks 2. Surgery next week   Recommendations for primary care physician for things to follow:  CBC, BMP, free T4.  Discharge Diagnoses:  Principal Problem:   ARF (acute renal failure) Active Problems:   Dehydration   Hypotension   Severe obesity (BMI >= 40)   Hyperglycemia   Lactic acid acidosis   Tobacco abuse  Discharge Condition: stable  Diet recommendation: regular  Filed Weights   12/21/12 0409 12/21/12 0500 12/22/12 0418  Weight: 121.428 kg (267 lb 11.2 oz) 121.428 kg (267 lb 11.2 oz) 124.376 kg (274 lb 3.2 oz)    History of present illness:  AVNER STRODER is a 37 y.o. male. Obese Caucasian gentleman, works as a Copywriter, advertising for [employer], has been working out in the sun since 7 AM this morning at about 3 PM got suddenly very dizzy and had nausea and vomiting and was transported to the and the pain emergency room was found to be hypotensive extremely dehydrated and blood work confirmed extreme dehydration and acute renal failure. He received 4 L of IV fluids and the hospitalist service was called to assist with management.  Hospital Course:  Dehydration  Acute renal failure  Obesity  Leukocytosis  Lactic acidosis  Thyroid mass   Multiple lab abnormalities due to severe dehydration. Lactic acid improved, AKI improved and returned to normal prior to discharge. CT neck with significant thyroid enlargement in need of surgical management, surgery has been consulted and appreciate their input. US thyroid showed large heterogeneous nodules within both thyroid lobes and a biopsy was performed on 7/3 prior to discharge. Patient is to follow up with his PCP as well as surgery next week (information has been provided to patient) for  resection. Thyroid pathology is pending at the time of discharge and would recommend that it be followed at his next appointment.   Procedures:  US thyroid biopsy   Consultations:  Surgery  Discharge Exam: Filed Vitals:   12/21/12 1845 12/21/12 2113 12/22/12 0204 12/22/12 0418  BP: 118/77 133/76 118/64 114/62  Pulse: 70 78  65  Temp: 98 F (36.7 C) 98.4 F (36.9 C) 97.6 F (36.4 C) 97.9 F (36.6 C)  TempSrc: Oral Oral Oral Oral  Resp: 18 18 18 18   Height:      Weight:    124.376 kg (274 lb 3.2 oz)  SpO2: 98% 97% 100% 100%   General: NAD Cardiovascular: RRR Respiratory: CTA biL  Discharge Instructions     Medication List         allopurinol 300 MG tablet  Commonly known as:  ZYLOPRIM  Take 300 mg by mouth every morning.     atorvastatin 20 MG tablet  Commonly known as:  LIPITOR  Take 20 mg by mouth every morning.     fish oil-omega-3 fatty acids 1000 MG capsule  Take 1 g by mouth daily.           Follow-up Information   Follow up with Dalia Heading, MD. Schedule an appointment as soon as possible for a visit on 12/29/2012.   Contact information:   [address] [address] [phone]       The results of significant diagnostics from this hospitalization (including imaging, microbiology, ancillary and laboratory) are listed  below for reference.    Significant Diagnostic Studies: Ct Soft Tissue Neck Wo Contrast  12/20/2012   *RADIOLOGY REPORT*  Clinical Data: Shortness of breath.  Dehydration.  Deviation of the upper trachea to the right on a portable chest radiograph obtained earlier today, raising the possibility of an enlarged left thyroid lobe or other neck mass.  CT NECK WITHOUT CONTRAST  Technique:  Multidetector CT imaging of the neck was performed without intravenous contrast.  Comparison: Portable chest obtained earlier today.  Findings: Diffusely enlarged thyroid gland containing a small number of coarse calcifications.  The  left lobe is significantly larger than the right lobe, measuring 7.0 x 5.3 cm on image number 77.  There is mild substernal extension on the left with tracheal deviation to the right.  There is also associated flattening of the trachea with mild airway narrowing.  No enlarged lymph nodes. Clear lung apices.  Unremarkable bones.  IMPRESSION: Large thyroid goiter with substernal extension on the left and tracheal deviation to the right with mild tracheal compression.   Original Report Authenticated By: Beckie Salts, M.D.   US Soft Tissue Head/neck  12/21/2012   *RADIOLOGY REPORT*  Clinical Data: Enlarged thyroid.  Abnormal CT scan.  THYROID ULTRASOUND  Technique: Ultrasound examination of the thyroid gland and adjacent soft tissues was performed.  Comparison:  CT neck with contrast 12/20/2012.  Next the  Findings:  Right thyroid lobe:  The right lobe measures 6.0 x 2.4 x 2.4 cm. Left thyroid lobe:  The left lobe is expanded, measuring 7.8 x 4.2 x 5.0 cm. Isthmus:  4 mm.  Focal nodules:  A heterogeneous nodule in the right lobe measures 3.5 x 2.3 x 3.2 cm.  A similar heterogeneous nodule in the left lobe measures 7.6 x 4.2 x 6.9 cm.  No definite microcalcifications are evident.  Lymphadenopathy:  None visualized  IMPRESSION: Large heterogeneous nodules within both thyroid lobes, left greater than right.  Findings meet consensus criteria for biopsy.  Ultrasound-guided fine needle aspiration should be considered, as per the consensus statement: Management of Thyroid Nodules Detected at [place]:  Society of Radiologists in Ultrasound Consensus Conference Statement. Radiology 2005; [id number].   Original Report Authenticated By: Marin Roberts, M.D.   Dg Chest Portable 1 View  12/20/2012   *RADIOLOGY REPORT*  Clinical Data: Shortness of breath  PORTABLE CHEST - 1 VIEW  Comparison: None  Findings: The heart, mediastinal, and hilar contours and pulmonary vascularity are normal.  The right costophrenic angle is excluded  from the image.  The lungs are clear.  No visible pleural effusion. Negative for pneumothorax.  The trachea is deviated to the right of midline.  Tracheal column does not appear narrowed.  IMPRESSION:  1.  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  There are no prior studies for comparison.  Question the possibility of an enlarged left thyroid lobe or other neck mass. 2.  No acute cardiopulmonary disease identified.   Original Report Authenticated By: Britta Mccreedy, M.D.   Labs: Basic Metabolic Panel:  Recent Labs Lab 12/20/12 1750 12/21/12 0515 12/22/12 0548  NA 138 139 139  K 4.2 3.5 4.5  CL 91* 99 107  CO2 19 30 26   GLUCOSE 165* 121* 94  BUN 21 23 14   CREATININE 3.07* 1.65* 0.93  CALCIUM 13.4* 9.0 8.9   Liver Function Tests:  Recent Labs Lab 12/20/12 1750 12/21/12 0515  AST 42* 24  ALT 74* 41  ALKPHOS 121* 68  BILITOT 1.1 0.6  PROT 10.6* 6.6  ALBUMIN 5.8* 3.4*   CBC:  Recent Labs Lab 12/20/12 1750 12/21/12 0515 12/22/12 0548  WBC 21.1* 13.1* 8.6  NEUTROABS 16.5*  --   --   HGB 18.4* 13.0 12.3*  HCT 49.7 37.4* 36.4*  MCV 89.1 91.4 92.9  PLT 316 209 187   Cardiac Enzymes:  Recent Labs Lab 12/20/12 1750 12/20/12 2149  CKTOTAL  --  164  TROPONINI <0.30  --    BNP: BNP (last 3 results)  Recent Labs  12/20/12 1750  PROBNP 176.8*   CBG:  Recent Labs Lab 12/20/12 1750  GLUCAP 160*   Signed:  Jayma Volpi  [facility] 12/22/2012, 12:18 PM

## 2012-12-22 NOTE — Procedures (Signed)
PreOperative Dx: Bilateral thyroid masses Postoperative Dx: Bilateral thyroid masses Procedure:   US guided FNA of bilateral thyroid masses Radiologist:  Tyron Russell Anesthesia:  1.5 ml of 2% lidocaine Specimen:  FNA x 3 of RT thyroid mass, FNA x 3 of LT thyroid mass  EBL:   None Complications: None

## 2013-01-11 ENCOUNTER — Other Ambulatory Visit: Payer: Self-pay | Admitting: Family Medicine

## 2013-02-15 NOTE — H&P (Signed)
Tyler [name] is an 37 y.o. male.   Chief Complaint: Thyromegaly HPI: Patient is a 37 year old white male who presented to [facility] hospital earlier this summer with dehydration. He was noted to have significant thyromegaly on CT scan of the chest. Ultrasound of the thyroid gland revealed in multinodular goiter. Fine-needle aspiration of one of the dominant nodules was negative for carcinoma. The patient now presents for a total thyroidectomy given the significant size. He does occasionally have some air hunger while lying flat.   Past Medical History  Diagnosis Date  . High cholesterol   . Gout     Past Surgical History  Procedure Laterality Date  . Eye surgery      No family history on file. Social History:  reports that he has been smoking.  He does not have any smokeless tobacco history on file. He reports that  drinks alcohol. He reports that he does not use illicit drugs.  Allergies: No Known Allergies  No prescriptions prior to admission    No results found for this or any previous visit (from the past 48 hour(s)). No results found.  Review of Systems  All other systems reviewed and are negative.    There were no vitals taken for this visit. Physical Exam  Constitutional: He is oriented to person, place, and time. He appears well-developed and well-nourished.  HENT:  Head: Normocephalic and atraumatic.  Neck: Normal range of motion. Neck supple. Tracheal deviation present. Thyromegaly present.  Cardiovascular: Normal rate, regular rhythm and normal heart sounds.   Respiratory: Effort normal and breath sounds normal.  GI: Soft. Bowel sounds are normal.  Lymphadenopathy:    He has no cervical adenopathy.  Neurological: He is alert and oriented to person, place, and time.  Skin: Skin is warm and dry.     Assessment/Plan Impression: Thyroid goiter, thyromegaly Plan: Patient scheduled for a total thyroidectomy on 03/03/2013. The risks and benefits of the procedure  including bleeding, infection, the possibly malignancy, and the possibility of nerve injury were fully explained to the patient, who gave informed consent.  Fajr Fife A 02/15/2013, 10:03 AM

## 2013-02-27 ENCOUNTER — Encounter (HOSPITAL_COMMUNITY)
Admission: RE | Admit: 2013-02-27 | Discharge: 2013-02-27 | Disposition: A | Discharge status: Home / Self Care | Payer: BC Managed Care – PPO | Source: Ambulatory Visit

## 2013-02-27 NOTE — Patient Instructions (Addendum)
Tyler [name]  02/27/2013   Your procedure is scheduled on:   03/03/2013   Report to [facility] at  615 AM.  Call this number if you have problems the morning of surgery: [phone]   Remember:   Do not eat food or drink liquids after midnight.   Take these medicines the morning of surgery with A SIP OF WATER: allopurinol   Do not wear jewelry, make-up or nail polish.  Do not wear lotions, powders, or perfumes.   Do not shave 48 hours prior to surgery. Men may shave face and neck.  Do not bring valuables to the hospital.  [facility] is not responsible for any belongings or valuables.  Contacts, dentures or bridgework may not be worn into surgery.  Leave suitcase in the car. After surgery it may be brought to your room.  For patients admitted to the hospital, checkout time is 11:00 AM the day of discharge.   Patients discharged the day of surgery will not be allowed to drive home.  Name and phone number of your driver: family  Special Instructions: Shower using CHG 2 nights before surgery and the night before surgery.  If you shower the day of surgery use CHG.  Use special wash - you have one bottle of CHG for all showers.  You should use approximately 1/3 of the bottle for each shower.   Please read over the following fact sheets that you were given: Pain Booklet, Coughing and Deep Breathing, Surgical Site Infection Prevention, Anesthesia Post-op Instructions and Care and Recovery After Surgery Thyroidectomy Thyroidectomy is the removal of part or all of your thyroid gland. Your thyroid gland is a butterfly-shaped gland at the base of your neck. It produces a substance called thyroid hormone, which regulates the physical and chemical processes that keep your body functioning and make energy available to your body (metabolism). The amount of thyroid gland tissue that is removed during a thyroidectomy depends on the reason for the procedure. Typically, if only a part of your gland is  removed, enough thyroid gland tissue remains to maintain normal function. If your entire thyroid gland is removed or if the amount of thyroid gland tissue remaining is inadequate to maintain normal function, you will need life-long treatment with thyroid hormone on a daily basis. Thyroidectomy maybe performed when you have the following conditions:  Thyroid nodules. These are small, abnormal collections of tissue that form inside the thyroid gland. If these nodules begin to enlarge at a rapid rate, a sample of tissue from the nodule is taken through a needle and examined (needle biopsy). This is done to determine if the nodules are cancerous. Depending on the outcome of this exam, thyroidectomy may be necessary.  Thyroid cancer.  Goiter, which is an enlarged thyroid gland. All or part of the thyroid gland may be removed if the gland has become so large that it causes difficulty breathing or swallowing.  Hyperthyroidism. This is when the thyroid gland produces too much thyroid hormone. Hypothyroidism can cause symptoms of fluctuating weight, intolerance to heat, irritability, shortness of breath, and chest pain. LET YOUR CAREGIVER KNOW ABOUT:   Allergies to food or medicine.  Medicines that you are taking, including vitamins, herbs, eyedrops, over-the-counter medicines, and creams.  Previous problems you have had with anesthetics or numbing medicines.  History of bleeding problems or blood clots.  Previous surgeries you have had.  Other health problems, including diabetes and kidney problems, you have had.  Possibility of pregnancy, if this applies. BEFORE THE PROCEDURE   Do not eat or drink anything, including water, for at least 6 hours before the procedure.  Ask your caregiver whether you should stop taking certain medicines before the day of the procedure. PROCEDURE  There are different ways that thyroidectomy is performed. For each type, you will be given a medicine to make you  sleep (general anesthetic). The three main types of thyroidectomy are listed as follows:  Conventional thyroidectomy A cut (incision) in the center portion of your lower neck is made with a scalpel. Muscles below your skin are separated to gain access to your thyroid gland. Your thyroid gland is dissected from your windpipe (trachea). Often a drain is placed at the incision site to drain any blood that accumulates under the skin after the procedure. This drain will be removed before you go home. The wound from the incision should heal within 2 weeks.  Endoscopic thyroidectomy Small incisions are made in your lower neck. A small instrument (endoscope) is inserted under your skin at the incision sites. The endoscope used for thyroidectomy consists of 2 flexible tubes. Inside one of the tubes is a video camera that is used to guide the Careers adviser. Tools to remove the thyroid gland, including a tool to cut the gland (dissectors) and a suction device, are inserted through the other tube. The surgeon uses the dissectors to dissect the thyroid gland from the trachea and remove it.  Robotic thyroidectomy This procedure allows your thyroid gland to be removed through incisions in your armpit, your chest, or high in your neck. Instruments similar to endoscopes provide a 3-dimensional picture of the surgical site. Dissecting instruments are controlled by devices similar to joysticks. These devices allow more accurate manipulation of the instruments. After the blood supply to the gland is removed, the gland is cut into several pieces and removed through the incisions. RISKS AND COMPLICATIONS Complications associated with thyroidectomy are rare, but they can occur. Possible complications include:  A decrease in parathyroid hormone levels (hypoparathyroidism) Your parathyroid glands are located close behind your thyroid gland. They are responsible for maintaining calcium levels inthe body. If they are damaged or removed,  levels of calcium in the blood become low and nerves become irritable, which can cause muscle spasms. Medicines are available to treat this.  Bacterial infection This can often be treated with medicines that kill bacteria (antibiotics).  Damage to your voice box nerves This could cause hoarseness or complete loss of voice.  Bleeding or airway obstruction. AFTER THE PROCEDURE   You will rest in the recovery room as you wake up.  When you first wake up, your throat may feel slightly sore.  You will not be allowed to eat or drink until instructed otherwise.  You will be taken to your hospital room. You will usually stay at the hospital for 1 or 2 nights.  If a drain is placed during the procedure, it usually is removed the next day.  You may have some mild neck pain.  Your voice may be weak. This usually is temporary. Document Released: 12/02/2000 Document Revised: 08/31/2011 Document Reviewed: 09/10/2010 [facility] Patient Information 2014 [place], [place]. PATIENT INSTRUCTIONS POST-ANESTHESIA  IMMEDIATELY FOLLOWING SURGERY:  Do not drive or operate machinery for the first twenty four hours after surgery.  Do not make any important decisions for twenty four hours after surgery or while taking narcotic pain medications or sedatives.  If you develop intractable nausea and vomiting or a severe headache  please notify your doctor immediately.  FOLLOW-UP:  Please make an appointment with your surgeon as instructed. You do not need to follow up with anesthesia unless specifically instructed to do so.  WOUND CARE INSTRUCTIONS (if applicable):  Keep a dry clean dressing on the anesthesia/puncture wound site if there is drainage.  Once the wound has quit draining you may leave it open to air.  Generally you should leave the bandage intact for twenty four hours unless there is drainage.  If the epidural site drains for more than 36-48 hours please call the anesthesia department.  QUESTIONS?:  Please  feel free to call your physician or the hospital operator if you have any questions, and they will be happy to assist you.

## 2013-03-01 ENCOUNTER — Encounter (HOSPITAL_COMMUNITY): Payer: Self-pay

## 2013-03-01 ENCOUNTER — Encounter (HOSPITAL_COMMUNITY)
Admission: RE | Admit: 2013-03-01 | Discharge: 2013-03-01 | Disposition: A | Discharge status: Home / Self Care | Payer: BC Managed Care – PPO | Source: Ambulatory Visit | Attending: General Surgery | Admitting: General Surgery

## 2013-03-01 ENCOUNTER — Encounter (HOSPITAL_COMMUNITY): Payer: Self-pay | Admitting: Pharmacy Technician

## 2013-03-01 LAB — CBC WITH DIFFERENTIAL/PLATELET
Basophils Absolute: 0 10*3/uL (ref 0.0–0.1)
Basophils Relative: 0 % (ref 0–1)
Eosinophils Relative: 2 % (ref 0–5)
Lymphocytes Relative: 31 % (ref 12–46)
MCHC: 34.4 g/dL (ref 30.0–36.0)
Neutro Abs: 5 10*3/uL (ref 1.7–7.7)
Platelets: 195 10*3/uL (ref 150–400)
RDW: 12.5 % (ref 11.5–15.5)
WBC: 8.3 10*3/uL (ref 4.0–10.5)

## 2013-03-01 LAB — COMPREHENSIVE METABOLIC PANEL
ALT: 43 U/L (ref 0–53)
AST: 22 U/L (ref 0–37)
Albumin: 3.7 g/dL (ref 3.5–5.2)
CO2: 27 mEq/L (ref 19–32)
Calcium: 9.8 mg/dL (ref 8.4–10.5)
Chloride: 105 mEq/L (ref 96–112)
GFR calc non Af Amer: >90 mL/min (ref >90)
Sodium: 141 mEq/L (ref 135–145)

## 2013-03-01 MED ORDER — CHLORHEXIDINE GLUCONATE 4 % EX LIQD: 1.0000 "application " | Freq: Once | CUTANEOUS | Status: DC

## 2013-03-01 NOTE — Patient Instructions (Addendum)
Tyler [name]  03/01/2013   Your procedure is scheduled on:  03/03/2013  Report to [facility] at 6:15 AM.  Call this number if you have problems the morning of surgery: [phone]   Remember:   Do not eat food or drink liquids after midnight.   Take these medicines the morning of surgery with A SIP OF WATER: Allopurinol   Do not wear jewelry, make-up or nail polish.  Do not wear lotions, powders, or perfumes. You may wear deodorant.  Do not shave 48 hours prior to surgery. Men may shave face and neck.  Do not bring valuables to the hospital.  [facility] is not responsible                   for any belongings or valuables.  Contacts, dentures or bridgework may not be worn into surgery.  Leave suitcase in the car. After surgery it may be brought to your room.  For patients admitted to the hospital, checkout time is 11:00 AM the day of  discharge.   Patients discharged the day of surgery will not be allowed to drive  home.  Name and phone number of your driver: aunt  Special Instructions: Shower using CHG 2 nights before surgery and the night before surgery.  If you shower the day of surgery use CHG.  Use special wash - you have one bottle of CHG for all showers.  You should use approximately 1/3 of the bottle for each shower.   Please read over the following fact sheets that you were given: Care and Recovery After Surgery  Thyroidectomy Thyroidectomy is the removal of part or all of your thyroid gland. Your thyroid gland is a butterfly-shaped gland at the base of your neck. It produces a substance called thyroid hormone, which regulates the physical and chemical processes that keep your body functioning and make energy available to your body (metabolism). The amount of thyroid gland tissue that is removed during a thyroidectomy depends on the reason for the procedure. Typically, if only a part of your gland is removed, enough thyroid gland tissue remains to maintain normal function. If your  entire thyroid gland is removed or if the amount of thyroid gland tissue remaining is inadequate to maintain normal function, you will need life-long treatment with thyroid hormone on a daily basis. Thyroidectomy maybe performed when you have the following conditions:  Thyroid nodules. These are small, abnormal collections of tissue that form inside the thyroid gland. If these nodules begin to enlarge at a rapid rate, a sample of tissue from the nodule is taken through a needle and examined (needle biopsy). This is done to determine if the nodules are cancerous. Depending on the outcome of this exam, thyroidectomy may be necessary.  Thyroid cancer.  Goiter, which is an enlarged thyroid gland. All or part of the thyroid gland may be removed if the gland has become so large that it causes difficulty breathing or swallowing.  Hyperthyroidism. This is when the thyroid gland produces too much thyroid hormone. Hypothyroidism can cause symptoms of fluctuating weight, intolerance to heat, irritability, shortness of breath, and chest pain. LET YOUR CAREGIVER KNOW ABOUT:   Allergies to food or medicine.  Medicines that you are taking, including vitamins, herbs, eyedrops, over-the-counter medicines, and creams.  Previous problems you have had with anesthetics or numbing medicines.  History of bleeding problems or blood clots.  Previous surgeries you have had.  Other health problems, including diabetes and kidney problems, you  have had.  Possibility of pregnancy, if this applies. BEFORE THE PROCEDURE   Do not eat or drink anything, including water, for at least 6 hours before the procedure.  Ask your caregiver whether you should stop taking certain medicines before the day of the procedure. PROCEDURE  There are different ways that thyroidectomy is performed. For each type, you will be given a medicine to make you sleep (general anesthetic). The three main types of thyroidectomy are listed as  follows:  Conventional thyroidectomy A cut (incision) in the center portion of your lower neck is made with a scalpel. Muscles below your skin are separated to gain access to your thyroid gland. Your thyroid gland is dissected from your windpipe (trachea). Often a drain is placed at the incision site to drain any blood that accumulates under the skin after the procedure. This drain will be removed before you go home. The wound from the incision should heal within 2 weeks.  Endoscopic thyroidectomy Small incisions are made in your lower neck. A small instrument (endoscope) is inserted under your skin at the incision sites. The endoscope used for thyroidectomy consists of 2 flexible tubes. Inside one of the tubes is a video camera that is used to guide the Careers adviser. Tools to remove the thyroid gland, including a tool to cut the gland (dissectors) and a suction device, are inserted through the other tube. The surgeon uses the dissectors to dissect the thyroid gland from the trachea and remove it.  Robotic thyroidectomy This procedure allows your thyroid gland to be removed through incisions in your armpit, your chest, or high in your neck. Instruments similar to endoscopes provide a 3-dimensional picture of the surgical site. Dissecting instruments are controlled by devices similar to joysticks. These devices allow more accurate manipulation of the instruments. After the blood supply to the gland is removed, the gland is cut into several pieces and removed through the incisions. RISKS AND COMPLICATIONS Complications associated with thyroidectomy are rare, but they can occur. Possible complications include:  A decrease in parathyroid hormone levels (hypoparathyroidism) Your parathyroid glands are located close behind your thyroid gland. They are responsible for maintaining calcium levels inthe body. If they are damaged or removed, levels of calcium in the blood become low and nerves become irritable, which can  cause muscle spasms. Medicines are available to treat this.  Bacterial infection This can often be treated with medicines that kill bacteria (antibiotics).  Damage to your voice box nerves This could cause hoarseness or complete loss of voice.  Bleeding or airway obstruction. AFTER THE PROCEDURE   You will rest in the recovery room as you wake up.  When you first wake up, your throat may feel slightly sore.  You will not be allowed to eat or drink until instructed otherwise.  You will be taken to your hospital room. You will usually stay at the hospital for 1 or 2 nights.  If a drain is placed during the procedure, it usually is removed the next day.  You may have some mild neck pain.  Your voice may be weak. This usually is temporary. Document Released: 12/02/2000 Document Revised: 08/31/2011 Document Reviewed: 09/10/2010 [facility] Patient Information 2014 [place], [place].

## 2013-03-03 ENCOUNTER — Encounter (HOSPITAL_COMMUNITY): Payer: Self-pay | Admitting: Anesthesiology

## 2013-03-03 ENCOUNTER — Encounter (HOSPITAL_COMMUNITY)
Admission: RE | Disposition: A | Discharge status: Home / Self Care | Payer: Self-pay | Source: Ambulatory Visit | Attending: General Surgery

## 2013-03-03 ENCOUNTER — Observation Stay (HOSPITAL_COMMUNITY)
Admission: RE | Admit: 2013-03-03 | Discharge: 2013-03-04 | Disposition: A | Discharge status: Home / Self Care | Payer: BC Managed Care – PPO | Source: Ambulatory Visit | Attending: General Surgery | Admitting: General Surgery

## 2013-03-03 ENCOUNTER — Ambulatory Visit (HOSPITAL_COMMUNITY): Payer: BC Managed Care – PPO | Admitting: Anesthesiology

## 2013-03-03 ENCOUNTER — Encounter (HOSPITAL_COMMUNITY): Payer: Self-pay

## 2013-03-03 DIAGNOSIS — Z01812 Encounter for preprocedural laboratory examination: Secondary | ICD-10-CM | POA: Insufficient documentation

## 2013-03-03 DIAGNOSIS — E042 Nontoxic multinodular goiter: Principal | ICD-10-CM | POA: Insufficient documentation

## 2013-03-03 DIAGNOSIS — Z79899 Other long term (current) drug therapy: Secondary | ICD-10-CM | POA: Insufficient documentation

## 2013-03-03 HISTORY — PX: THYROIDECTOMY: SHX17

## 2013-03-03 SURGERY — THYROIDECTOMY
Anesthesia: General | Site: Neck | Wound class: Clean

## 2013-03-03 MED ORDER — ENOXAPARIN SODIUM 40 MG/0.4ML ~~LOC~~ SOLN
40.0000 mg | Freq: Once | SUBCUTANEOUS | Status: AC
  Administered 2013-03-03: 40 mg via SUBCUTANEOUS

## 2013-03-03 MED ORDER — FENTANYL CITRATE 0.05 MG/ML IJ SOLN
INTRAMUSCULAR | Status: AC
  Filled 2013-03-03: qty 5

## 2013-03-03 MED ORDER — MIDAZOLAM HCL 2 MG/2ML IJ SOLN
INTRAMUSCULAR | Status: AC
  Filled 2013-03-03: qty 2

## 2013-03-03 MED ORDER — SODIUM CHLORIDE 0.9 % IR SOLN
Status: DC | PRN
  Administered 2013-03-03: 800 mL

## 2013-03-03 MED ORDER — DEXAMETHASONE SODIUM PHOSPHATE 4 MG/ML IJ SOLN
INTRAMUSCULAR | Status: AC
  Filled 2013-03-03: qty 1

## 2013-03-03 MED ORDER — SUCCINYLCHOLINE CHLORIDE 20 MG/ML IJ SOLN
INTRAMUSCULAR | Status: AC
  Filled 2013-03-03: qty 1

## 2013-03-03 MED ORDER — GLYCOPYRROLATE 0.2 MG/ML IJ SOLN
INTRAMUSCULAR | Status: AC
  Filled 2013-03-03: qty 1

## 2013-03-03 MED ORDER — ONDANSETRON HCL 4 MG/2ML IJ SOLN
4.0000 mg | Freq: Once | INTRAMUSCULAR | Status: AC
  Administered 2013-03-03: 4 mg via INTRAVENOUS

## 2013-03-03 MED ORDER — GLYCOPYRROLATE 0.2 MG/ML IJ SOLN
INTRAMUSCULAR | Status: AC
  Filled 2013-03-03: qty 3

## 2013-03-03 MED ORDER — KETOROLAC TROMETHAMINE 30 MG/ML IJ SOLN
INTRAMUSCULAR | Status: AC
  Filled 2013-03-03: qty 1

## 2013-03-03 MED ORDER — FENTANYL CITRATE 0.05 MG/ML IJ SOLN
INTRAMUSCULAR | Status: AC
  Filled 2013-03-03: qty 2

## 2013-03-03 MED ORDER — LACTATED RINGERS IV SOLN
INTRAVENOUS | Status: DC | PRN
  Administered 2013-03-03 (×2): via INTRAVENOUS

## 2013-03-03 MED ORDER — PROPOFOL 10 MG/ML IV EMUL
INTRAVENOUS | Status: AC
  Filled 2013-03-03: qty 20

## 2013-03-03 MED ORDER — FENTANYL CITRATE 0.05 MG/ML IJ SOLN
INTRAMUSCULAR | Status: DC | PRN
  Administered 2013-03-03 (×5): 50 ug via INTRAVENOUS
  Administered 2013-03-03: 100 ug via INTRAVENOUS
  Administered 2013-03-03 (×2): 50 ug via INTRAVENOUS
  Administered 2013-03-03: 100 ug via INTRAVENOUS

## 2013-03-03 MED ORDER — LACTATED RINGERS IV SOLN
INTRAVENOUS | Status: DC
  Administered 2013-03-03: 07:00:00 via INTRAVENOUS

## 2013-03-03 MED ORDER — ROCURONIUM BROMIDE 50 MG/5ML IV SOLN
INTRAVENOUS | Status: AC
  Filled 2013-03-03: qty 1

## 2013-03-03 MED ORDER — GLYCOPYRROLATE 0.2 MG/ML IJ SOLN
0.2000 mg | Freq: Once | INTRAMUSCULAR | Status: AC
  Administered 2013-03-03: 0.2 mg via INTRAVENOUS

## 2013-03-03 MED ORDER — MIDAZOLAM HCL 2 MG/2ML IJ SOLN
1.0000 mg | INTRAMUSCULAR | Status: DC | PRN
  Administered 2013-03-03: 2 mg via INTRAVENOUS

## 2013-03-03 MED ORDER — LIDOCAINE HCL (PF) 1 % IJ SOLN
INTRAMUSCULAR | Status: AC
  Filled 2013-03-03: qty 5

## 2013-03-03 MED ORDER — LACTATED RINGERS IV SOLN: INTRAVENOUS | Status: DC

## 2013-03-03 MED ORDER — ONDANSETRON HCL 4 MG/2ML IJ SOLN: 4.0000 mg | Freq: Four times a day (QID) | INTRAMUSCULAR | Status: DC | PRN

## 2013-03-03 MED ORDER — ONDANSETRON HCL 4 MG/2ML IJ SOLN: 4.0000 mg | Freq: Once | INTRAMUSCULAR | Status: DC | PRN

## 2013-03-03 MED ORDER — HYDROMORPHONE HCL PF 1 MG/ML IJ SOLN: 1.0000 mg | INTRAMUSCULAR | Status: DC | PRN

## 2013-03-03 MED ORDER — DEXAMETHASONE SODIUM PHOSPHATE 4 MG/ML IJ SOLN
4.0000 mg | Freq: Once | INTRAMUSCULAR | Status: AC
  Administered 2013-03-03: 4 mg via INTRAVENOUS

## 2013-03-03 MED ORDER — PROPOFOL 10 MG/ML IV BOLUS
INTRAVENOUS | Status: DC | PRN
  Administered 2013-03-03: 150 mg via INTRAVENOUS
  Administered 2013-03-03: 50 mg via INTRAVENOUS

## 2013-03-03 MED ORDER — ROCURONIUM BROMIDE 100 MG/10ML IV SOLN
INTRAVENOUS | Status: DC | PRN
  Administered 2013-03-03 (×4): 10 mg via INTRAVENOUS

## 2013-03-03 MED ORDER — GLYCOPYRROLATE 0.2 MG/ML IJ SOLN
INTRAMUSCULAR | Status: DC | PRN
  Administered 2013-03-03: 0.6 mg via INTRAVENOUS

## 2013-03-03 MED ORDER — LIDOCAINE HCL (CARDIAC) 20 MG/ML IV SOLN
INTRAVENOUS | Status: DC | PRN
  Administered 2013-03-03: 50 mg via INTRAVENOUS

## 2013-03-03 MED ORDER — ONDANSETRON HCL 4 MG/2ML IJ SOLN
INTRAMUSCULAR | Status: AC
  Filled 2013-03-03: qty 2

## 2013-03-03 MED ORDER — LABETALOL HCL 5 MG/ML IV SOLN
INTRAVENOUS | Status: DC | PRN
  Administered 2013-03-03 (×3): 5 mg via INTRAVENOUS

## 2013-03-03 MED ORDER — FENTANYL CITRATE 0.05 MG/ML IJ SOLN: 25.0000 ug | INTRAMUSCULAR | Status: DC | PRN

## 2013-03-03 MED ORDER — MENTHOL 3 MG MT LOZG: 1.0000 | LOZENGE | OROMUCOSAL | Status: DC | PRN

## 2013-03-03 MED ORDER — NEOSTIGMINE METHYLSULFATE 1 MG/ML IJ SOLN
INTRAMUSCULAR | Status: DC | PRN
  Administered 2013-03-03: 4 mg via INTRAVENOUS

## 2013-03-03 MED ORDER — ENOXAPARIN SODIUM 40 MG/0.4ML ~~LOC~~ SOLN
SUBCUTANEOUS | Status: AC
  Filled 2013-03-03: qty 0.4

## 2013-03-03 MED ORDER — KETOROLAC TROMETHAMINE 30 MG/ML IJ SOLN
30.0000 mg | Freq: Once | INTRAMUSCULAR | Status: AC
  Administered 2013-03-03: 30 mg via INTRAVENOUS

## 2013-03-03 MED ORDER — ARTIFICIAL TEARS OP OINT
TOPICAL_OINTMENT | OPHTHALMIC | Status: AC
  Filled 2013-03-03: qty 3.5

## 2013-03-03 MED ORDER — ONDANSETRON HCL 4 MG PO TABS: 4.0000 mg | ORAL_TABLET | Freq: Four times a day (QID) | ORAL | Status: DC | PRN

## 2013-03-03 MED ORDER — ENOXAPARIN SODIUM 40 MG/0.4ML ~~LOC~~ SOLN
40.0000 mg | SUBCUTANEOUS | Status: DC
  Administered 2013-03-04: 40 mg via SUBCUTANEOUS
  Filled 2013-03-03: qty 0.4

## 2013-03-03 MED ORDER — OXYCODONE-ACETAMINOPHEN 5-325 MG PO TABS: 1.0000 | ORAL_TABLET | ORAL | Status: DC | PRN

## 2013-03-03 MED ORDER — BUPIVACAINE HCL (PF) 0.5 % IJ SOLN
INTRAMUSCULAR | Status: DC | PRN
  Administered 2013-03-03: 8 mL

## 2013-03-03 MED ORDER — MIDAZOLAM HCL 5 MG/5ML IJ SOLN
INTRAMUSCULAR | Status: DC | PRN
  Administered 2013-03-03: 2 mg via INTRAVENOUS

## 2013-03-03 MED ORDER — BUPIVACAINE HCL (PF) 0.5 % IJ SOLN
INTRAMUSCULAR | Status: AC
  Filled 2013-03-03: qty 30

## 2013-03-03 SURGICAL SUPPLY — 56 items
APPLIER CLIP 11 MED OPEN (CLIP)
APPLIER CLIP 9.375 SM OPEN (CLIP) ×6
ATTRACTOMAT 16X20 MAGNETIC DRP (DRAPES) ×2 IMPLANT
BAG HAMPER (MISCELLANEOUS) ×2 IMPLANT
BLADE SURG 15 STRL LF DISP TIS (BLADE) ×1 IMPLANT
BLADE SURG 15 STRL SS (BLADE) ×1
BLADE SURG SZ10 CARB STEEL (BLADE) ×2 IMPLANT
CLIP APPLIE 11 MED OPEN (CLIP) IMPLANT
CLIP APPLIE 9.375 SM OPEN (CLIP) ×3 IMPLANT
CLOTH BEACON ORANGE TIMEOUT ST (SAFETY) ×2 IMPLANT
COVER LIGHT HANDLE STERIS (MISCELLANEOUS) ×4 IMPLANT
DERMABOND ADVANCED (GAUZE/BANDAGES/DRESSINGS) ×1
DERMABOND ADVANCED .7 DNX12 (GAUZE/BANDAGES/DRESSINGS) ×1 IMPLANT
DRAPE PED LAPAROTOMY (DRAPES) ×2 IMPLANT
DRAPE PROXIMA HALF (DRAPES) ×4 IMPLANT
DURAPREP 26ML APPLICATOR (WOUND CARE) ×2 IMPLANT
ELECT NEEDLE TIP 2.8 STRL (NEEDLE) ×2 IMPLANT
ELECT REM PT RETURN 9FT ADLT (ELECTROSURGICAL) ×2
ELECTRODE REM PT RTRN 9FT ADLT (ELECTROSURGICAL) ×1 IMPLANT
FORMALIN 10 PREFIL 120ML (MISCELLANEOUS) ×4 IMPLANT
GAUZE SPONGE 4X4 16PLY XRAY LF (GAUZE/BANDAGES/DRESSINGS) ×8 IMPLANT
GLOVE BIO SURGEON STRL SZ7.5 (GLOVE) ×2 IMPLANT
GLOVE ECLIPSE 6.5 STRL STRAW (GLOVE) ×2 IMPLANT
GLOVE ECLIPSE 7.0 STRL STRAW (GLOVE) ×4 IMPLANT
GLOVE INDICATOR 7.0 STRL GRN (GLOVE) ×2 IMPLANT
GLOVE INDICATOR 7.5 STRL GRN (GLOVE) ×4 IMPLANT
GOWN STRL REIN XL XLG (GOWN DISPOSABLE) ×6 IMPLANT
HEMOSTAT SURGICEL 2X3 (HEMOSTASIS) IMPLANT
KIT BLADEGUARD II DBL (SET/KITS/TRAYS/PACK) ×2 IMPLANT
KIT ROOM TURNOVER APOR (KITS) ×2 IMPLANT
MANIFOLD NEPTUNE II (INSTRUMENTS) ×2 IMPLANT
MARKER SKIN DUAL TIP RULER LAB (MISCELLANEOUS) ×2 IMPLANT
NEEDLE HYPO 25X1 1.5 SAFETY (NEEDLE) ×2 IMPLANT
NS IRRIG 1000ML POUR BTL (IV SOLUTION) ×2 IMPLANT
PACK BASIC III (CUSTOM PROCEDURE TRAY) ×1
PACK SRG BSC III STRL LF ECLPS (CUSTOM PROCEDURE TRAY) ×1 IMPLANT
PAD ARMBOARD 7.5X6 YLW CONV (MISCELLANEOUS) ×2 IMPLANT
PENCIL HANDSWITCHING (ELECTRODE) ×2 IMPLANT
SET BASIN LINEN APH (SET/KITS/TRAYS/PACK) ×2 IMPLANT
SHEARS HARMONIC 9CM CVD (BLADE) IMPLANT
SPONGE INTESTINAL PEANUT (DISPOSABLE) ×28 IMPLANT
STAPLER VISISTAT (STAPLE) ×2 IMPLANT
SUT ETHILON 3 0 FSL (SUTURE) ×2 IMPLANT
SUT ETHILON 4 0 PS 2 18 (SUTURE) ×2 IMPLANT
SUT SILK 2 0 (SUTURE) ×1
SUT SILK 2-0 18XBRD TIE 12 (SUTURE) ×1 IMPLANT
SUT SILK 3 0 (SUTURE) ×1
SUT SILK 3-0 18XBRD TIE 12 (SUTURE) ×1 IMPLANT
SUT VIC AB 2-0 CT2 27 (SUTURE) ×2 IMPLANT
SUT VIC AB 3-0 SH 27 (SUTURE) ×2
SUT VIC AB 3-0 SH 27X BRD (SUTURE) ×2 IMPLANT
SUT VIC AB 4-0 PS2 27 (SUTURE) ×2 IMPLANT
SYR CONTROL 10ML LL (SYRINGE) ×2 IMPLANT
SYSTEM CHEST DRAIN TLS 7FR (DRAIN) ×2 IMPLANT
TOWEL OR 17X26 4PK STRL BLUE (TOWEL DISPOSABLE) IMPLANT
YANKAUER SUCT BULB TIP 10FT TU (MISCELLANEOUS) ×2 IMPLANT

## 2013-03-03 NOTE — Preoperative (Signed)
Beta Blockers   Reason not to administer Beta Blockers:Not Applicable 

## 2013-03-03 NOTE — Anesthesia Preprocedure Evaluation (Signed)
Anesthesia Evaluation  Patient identified by MRN, date of birth, ID band Patient awake    Reviewed: Allergy & Precautions, H&P , NPO status , Patient's Chart, lab work & pertinent test results  Airway Mallampati: III TM Distance: >3 FB Neck ROM: Full  Mouth opening: Limited Mouth Opening Comment: Tracheal deviation to Right Dental  (+) Teeth Intact   Pulmonary Current Smoker,  breath sounds clear to auscultation        Cardiovascular hypertension (borderline), Rhythm:Regular Rate:Normal     Neuro/Psych    GI/Hepatic negative GI ROS,   Endo/Other    Renal/GU      Musculoskeletal   Abdominal   Peds  Hematology   Anesthesia Other Findings   Reproductive/Obstetrics                           Anesthesia Physical Anesthesia Plan  ASA: II  Anesthesia Plan: General   Post-op Pain Management:    Induction: Intravenous, Rapid sequence and Cricoid pressure planned  Airway Management Planned: Video Laryngoscope Planned  Additional Equipment:   Intra-op Plan:   Post-operative Plan: Extubation in OR  Informed Consent: I have reviewed the patients History and Physical, chart, labs and discussed the procedure including the risks, benefits and alternatives for the proposed anesthesia with the patient or authorized representative who has indicated his/her understanding and acceptance.     Plan Discussed with:   Anesthesia Plan Comments:         Anesthesia Quick Evaluation

## 2013-03-03 NOTE — Interval H&P Note (Signed)
History and Physical Interval Note:  03/03/2013 7:23 AM  Tyler [name]  has presented today for surgery, with the diagnosis of goiter  The various methods of treatment have been discussed with the patient and family. After consideration of risks, benefits and other options for treatment, the patient has consented to  Procedure(s): TOTAL THYROIDECTOMY (N/A) as a surgical intervention .  The patient's history has been reviewed, patient examined, no change in status, stable for surgery.  I have reviewed the patient's chart and labs.  Questions were answered to the patient's satisfaction.     Franky Macho A

## 2013-03-03 NOTE — Transfer of Care (Signed)
Immediate Anesthesia Transfer of Care Note  Patient: Tyler [name]  Procedure(s) Performed: Procedure(s): TOTAL THYROIDECTOMY (N/A)  Patient Location: PACU  Anesthesia Type:General  Level of Consciousness: awake, oriented and patient cooperative  Airway & Oxygen Therapy: Patient Spontanous Breathing and aerosol face mask  Post-op Assessment: Report given to PACU RN and Post -op Vital signs reviewed and stable  Post vital signs: Reviewed and stable  Complications: No apparent anesthesia complications

## 2013-03-03 NOTE — Anesthesia Postprocedure Evaluation (Addendum)
  Anesthesia Post-op Note  Patient: Tyler [name]  Procedure(s) Performed: Procedure(s): TOTAL THYROIDECTOMY (N/A)  Patient Location: PACU  Anesthesia Type:General  Level of Consciousness: awake, oriented and patient cooperative  Airway and Oxygen Therapy: Patient Spontanous Breathing and aerosol face mask  Post-op Pain: mild  Post-op Assessment: Post-op Vital signs reviewed, Patient's Cardiovascular Status Stable, Patent Airway, No signs of Nausea or vomiting and Pain level controlled  Post-op Vital Signs: Reviewed and stable  Complications: No apparent anesthesia complications Pt. Discharged 03/04/13.

## 2013-03-03 NOTE — Anesthesia Procedure Notes (Signed)
Procedure Name: Intubation Date/Time: 03/03/2013 7:41 AM Performed by: Carolyne Littles, Meghan Tiemann L Pre-anesthesia Checklist: Patient identified, Patient being monitored, Timeout performed, Emergency Drugs available and Suction available Patient Re-evaluated:Patient Re-evaluated prior to inductionOxygen Delivery Method: Circle System Utilized Preoxygenation: Pre-oxygenation with 100% oxygen Intubation Type: IV induction and Rapid sequence Grade View: Grade I Tube type: Oral Tube size: 7.0 mm Number of attempts: 1 Airway Equipment and Method: stylet and Video-laryngoscopy Placement Confirmation: ETT inserted through vocal cords under direct vision,  positive ETCO2 and breath sounds checked- equal and bilateral Secured at: 21 cm Tube secured with: Tape Dental Injury: Teeth and Oropharynx as per pre-operative assessment  Difficulty Due To: Difficulty was anticipated

## 2013-03-03 NOTE — Op Note (Signed)
Patient:  Tyler [name]  DOB:  1976/03/18  MRN:  [id number]   Preop Diagnosis:  Multinodular goiter  Postop Diagnosis:  Same  Procedure:  Total thyroidectomy  Surgeon:  Franky Macho, M.D.  Anes:  General endotracheal  Indications:   Patient is a 37 year old white male was found recently to have an enlarging multinodular goiter. He had significant enlargement of left lobe of the thyroid gland. Needle aspiration was negative for malignancy. Given the significant size of the multinodular goiter, the patient now comes the operating room for total thyroidectomy. The risks and benefits of the procedure including bleeding, infection, nerve injury, the possibly of finding a malignancy were fully explained to the patient, who gave informed consent.  Procedure note:  The patient is placed the supine position. After induction of general endotracheal anesthesia, the neck was prepped and draped using usual sterile technique with DuraPrep. Surgical site confirmation was performed.  A transverse incision was made just above the jugular notch. The platysma was divided using the Bovie electrocautery without difficulty. The dissection was taken down to the strap muscle. The strap muscle was then divided longitudinally across the midline. The trachea was noted to be deviated to the right. The strap muscle was then freed away bluntly from the left lobe of the thyroid gland. The left lobe of thyroid gland is significantly enlarged. A hard nodule was noted in the superior pole left lobe of thyroid gland. The middle thyroidal artery and vein as well as the inferior thyroidal artery and vein were both identified and ligated. Care was taken to avoid the parathyroid glands. The suspensory ligament was divided and ligated using 3-0 silk ties. The gland was then divided at the tracheal level in order to provide additional exposure. Care was taken to avoid the left recurrent laryngeal nerve. The left lobe of thyroid gland  was then removed from the operative field and sent separately to pathology. A smaller right lobe was then identified. This, along with the isthmus, was removed without difficulty. The middle thyroidal artery and vein as well as inferior thyroidal artery and veins were ligated and divided using small clips. The suspensory ligament was also ligated using clips. Care was taken to avoid the parathyroid glands and the right recurrent laryngeal nerve. The right lobe and isthmus were then sent to pathology further examination. The thyroid beds were inspected and any bleeding controlled using small clips. Surgicel was placed in both thyroidal bed. A #5 round THC drain was then placed and brought through separate stab wound to the left of the incision. It was secured in at the skin level using a 4-0 nylon suture. The strap muscles were reapproximated using 3-0 Vicryl running suture. The platysma was reapproximated using a 3-0 Vicryl running suture. The skin was closed using a 4 Vicryl subcuticular suture. 0.5% Sensorcaine was instilled the surrounding wound. Dermabond was then applied.  All tape and needle counts were correct at the end of the procedure. Patient was extubated in the operating room and transferred to PACU in stable condition. The patient was able to phonate E without difficulty.  Complications:  None  EBL:  300 cc  Specimen:  Left lobe of thyroid, right lobe and isthmus of thyroid  Drain: Thyroid beds

## 2013-03-04 LAB — COMPREHENSIVE METABOLIC PANEL
Alkaline Phosphatase: 71 U/L (ref 39–117)
BUN: 11 mg/dL (ref 6–23)
Chloride: 104 mEq/L (ref 96–112)
Creatinine, Ser: 0.76 mg/dL (ref 0.50–1.35)
GFR calc Af Amer: >90 mL/min (ref >90)
GFR calc non Af Amer: >90 mL/min (ref >90)
Glucose, Bld: 135 mg/dL — ABNORMAL HIGH (ref 70–99)
Potassium: 3.7 mEq/L (ref 3.5–5.1)
Total Bilirubin: 0.2 mg/dL — ABNORMAL LOW (ref 0.3–1.2)

## 2013-03-04 LAB — CBC
Platelets: 199 10*3/uL (ref 150–400)
RBC: 3.85 MIL/uL — ABNORMAL LOW (ref 4.22–5.81)
RDW: 12.4 % (ref 11.5–15.5)
WBC: 13.7 10*3/uL — ABNORMAL HIGH (ref 4.0–10.5)

## 2013-03-04 MED ORDER — CALCIUM CARBONATE-VITAMIN D 500-200 MG-UNIT PO TABS: 2.0000 | ORAL_TABLET | Freq: Two times a day (BID) | ORAL | Status: DC

## 2013-03-04 MED ORDER — LEVOTHYROXINE SODIUM 150 MCG PO TABS: 150.0000 ug | ORAL_TABLET | Freq: Every day | ORAL | Status: DC

## 2013-03-04 MED ORDER — HYDROCODONE-ACETAMINOPHEN 5-325 MG PO TABS: 1.0000 | ORAL_TABLET | ORAL | Status: DC | PRN

## 2013-03-04 NOTE — Discharge Summary (Signed)
Physician Discharge Summary  Patient ID: Tyler [name] MRN: [id number] DOB/AGE: 08-04-1975 36 y.o.  Admit date: 03/03/2013 Discharge date: 03/04/2013  Admission Diagnoses: Multinodular goiter  Discharge Diagnoses: Same Active Problems:   * No active hospital problems. *   Discharged Condition: good  Hospital Course: Patient is a 37 year old white male who was found incidentally during an admission earlier this year to have an enlarged thyroid gland. He ultimately was found to have a multinodular goiter. Initial fine-needle aspiration was negative for malignancy. Due to tracheal deviation, he underwent a total thyroidectomy on 03/03/2013. Tolerated the procedure well. His postoperative course has been unremarkable. His calcium level has remained within normal limits. He was able to phonate e without difficulty. He has no voice changes. He is being discharged home in good improving condition.  Treatments: surgery: Total thyroidectomy on 03/03/2013  Discharge Exam: Blood pressure 134/79, pulse 80, temperature 99.1 F (37.3 C), temperature source Oral, resp. rate 20, height 5\' 9"  (1.753 m), weight 126.2 kg (278 lb 3.5 oz), SpO2 99.00%. General appearance: alert, cooperative and no distress Neck: Incision healing well. Minimal serosanguineous drainage from JP drain which was removed. Resp: clear to auscultation bilaterally Cardio: regular rate and rhythm, S1, S2 normal, no murmur, click, rub or gallop  Disposition: 01-Home or Self Care     Medication List         allopurinol 300 MG tablet  Commonly known as:  ZYLOPRIM  Take 300 mg by mouth every morning.     atorvastatin 20 MG tablet  Commonly known as:  LIPITOR  Take 20 mg by mouth every morning.     calcium-vitamin D 500-200 MG-UNIT per tablet  Commonly known as:  OSCAL WITH D  Take 2 tablets by mouth 2 (two) times daily.     fish oil-omega-3 fatty acids 1000 MG capsule  Take 1 g by mouth daily.     HYDROcodone-acetaminophen 5-325 MG per tablet  Commonly known as:  NORCO/VICODIN  Take 1-2 tablets by mouth every 4 (four) hours as needed for pain.     levothyroxine 150 MCG tablet  Commonly known as:  SYNTHROID  Take 1 tablet (150 mcg total) by mouth daily before breakfast.           Follow-up Information   Follow up with Dalia Heading, MD. Schedule an appointment as soon as possible for a visit on 03/14/2013.   Specialty:  General Surgery   Contact information:   [address] [place] [address] [phone]       Signed: Franky Macho A 03/04/2013, 9:07 AM

## 2013-03-04 NOTE — Progress Notes (Signed)
AVS reviewed with patient.  Pt verbalized understanding of d/c instructions, physician follow-up, incision care and new medications.  RN asked Dr. Lovell Sheehan about wound care since drain was removed this morning.  Stated that patient may keep area covered with bandaid if he chooses too.  He may shower with soap and water.  Pt educated. Teach back method used.  IV removed.  Site WNL.  Pt reports all belongings intact and in possession at time of discharge.  Pt chose to walk to main entrance for discharge.  Pt stable at time of discharge.

## 2013-03-06 ENCOUNTER — Encounter (HOSPITAL_COMMUNITY): Payer: Self-pay | Admitting: General Surgery

## 2013-04-08 ENCOUNTER — Other Ambulatory Visit: Payer: Self-pay | Admitting: Family Medicine

## 2013-04-10 NOTE — Telephone Encounter (Signed)
Last seen 2/14  FPW

## 2013-04-24 ENCOUNTER — Other Ambulatory Visit (HOSPITAL_COMMUNITY): Payer: Self-pay | Admitting: "Endocrinology

## 2013-04-24 DIAGNOSIS — C73 Malignant neoplasm of thyroid gland: Secondary | ICD-10-CM

## 2013-05-03 ENCOUNTER — Encounter (HOSPITAL_COMMUNITY): Payer: Self-pay

## 2013-05-03 ENCOUNTER — Encounter (HOSPITAL_COMMUNITY)
Admission: RE | Admit: 2013-05-03 | Discharge: 2013-05-03 | Disposition: A | Discharge status: Home / Self Care | Payer: BC Managed Care – PPO | Source: Ambulatory Visit | Attending: "Endocrinology | Admitting: "Endocrinology

## 2013-05-03 DIAGNOSIS — C73 Malignant neoplasm of thyroid gland: Secondary | ICD-10-CM

## 2013-05-03 HISTORY — DX: Essential (primary) hypertension: I10

## 2013-05-03 HISTORY — DX: Malignant (primary) neoplasm, unspecified: C80.1

## 2013-05-03 MED ORDER — THYROTROPIN ALFA 1.1 MG IM SOLR
INTRAMUSCULAR | Status: AC
  Administered 2013-05-03: 0.9 mg via INTRAMUSCULAR
  Filled 2013-05-03: qty 0.9

## 2013-05-03 MED ORDER — THYROTROPIN ALFA 1.1 MG IM SOLR
0.9000 mg | INTRAMUSCULAR | Status: AC
  Administered 2013-05-03: 0.9 mg via INTRAMUSCULAR

## 2013-05-04 ENCOUNTER — Other Ambulatory Visit: Payer: Self-pay | Admitting: Family Medicine

## 2013-05-04 ENCOUNTER — Ambulatory Visit (HOSPITAL_COMMUNITY)
Admission: RE | Admit: 2013-05-04 | Discharge: 2013-05-04 | Disposition: A | Discharge status: Home / Self Care | Payer: BC Managed Care – PPO | Source: Ambulatory Visit | Attending: "Endocrinology | Admitting: "Endocrinology

## 2013-05-04 DIAGNOSIS — C73 Malignant neoplasm of thyroid gland: Secondary | ICD-10-CM

## 2013-05-04 MED ORDER — THYROTROPIN ALFA 1.1 MG IM SOLR
0.9000 mg | INTRAMUSCULAR | Status: AC
  Administered 2013-05-04: 0.9 mg via INTRAMUSCULAR

## 2013-05-05 ENCOUNTER — Ambulatory Visit (HOSPITAL_COMMUNITY)
Admission: RE | Admit: 2013-05-05 | Discharge: 2013-05-05 | Disposition: A | Discharge status: Home / Self Care | Payer: BC Managed Care – PPO | Source: Ambulatory Visit | Attending: "Endocrinology | Admitting: "Endocrinology

## 2013-05-05 ENCOUNTER — Encounter (HOSPITAL_COMMUNITY): Payer: Self-pay

## 2013-05-05 MED ORDER — SODIUM IODIDE I 131 CAPSULE
100.0000 | Freq: Once | INTRAVENOUS | Status: AC | PRN
  Administered 2013-05-05: 100 via ORAL

## 2013-05-05 NOTE — Telephone Encounter (Signed)
nTBS-Dr. Modesto Charon

## 2013-05-05 NOTE — Telephone Encounter (Signed)
Last seen 08/19/12 by Modesto Charon

## 2013-05-15 ENCOUNTER — Telehealth: Payer: Self-pay | Admitting: Family Medicine

## 2013-05-15 ENCOUNTER — Encounter (HOSPITAL_COMMUNITY): Payer: Self-pay

## 2013-05-15 ENCOUNTER — Encounter (HOSPITAL_COMMUNITY)
Admission: RE | Admit: 2013-05-15 | Discharge: 2013-05-15 | Disposition: A | Discharge status: Home / Self Care | Payer: BC Managed Care – PPO | Source: Ambulatory Visit | Attending: "Endocrinology | Admitting: "Endocrinology

## 2013-05-15 DIAGNOSIS — Z9889 Other specified postprocedural states: Secondary | ICD-10-CM | POA: Insufficient documentation

## 2013-05-15 DIAGNOSIS — C73 Malignant neoplasm of thyroid gland: Secondary | ICD-10-CM | POA: Insufficient documentation

## 2013-05-15 NOTE — Telephone Encounter (Signed)
APPT MADE

## 2013-05-17 ENCOUNTER — Encounter (INDEPENDENT_AMBULATORY_CARE_PROVIDER_SITE_OTHER): Payer: Self-pay

## 2013-05-17 ENCOUNTER — Encounter: Payer: Self-pay | Admitting: Family Medicine

## 2013-05-17 ENCOUNTER — Ambulatory Visit (INDEPENDENT_AMBULATORY_CARE_PROVIDER_SITE_OTHER): Payer: BC Managed Care – PPO | Admitting: Family Medicine

## 2013-05-17 VITALS — BP 167/114 | HR 86 | Temp 98.6°F | Ht 69.0 in | Wt 279.0 lb

## 2013-05-17 DIAGNOSIS — E039 Hypothyroidism, unspecified: Secondary | ICD-10-CM

## 2013-05-17 DIAGNOSIS — M109 Gout, unspecified: Secondary | ICD-10-CM

## 2013-05-17 DIAGNOSIS — I10 Essential (primary) hypertension: Secondary | ICD-10-CM

## 2013-05-17 DIAGNOSIS — M542 Cervicalgia: Secondary | ICD-10-CM

## 2013-05-17 LAB — POCT CBC
Granulocyte percent: 68.1 %G (ref 37–80)
HCT, POC: 45.2 % (ref 43.5–53.7)
Hemoglobin: 14.7 g/dL (ref 14.1–18.1)
Lymph, poc: 3.3 (ref 0.6–3.4)
MCH, POC: 29.1 pg (ref 27–31.2)
MCHC: 32.5 g/dL (ref 31.8–35.4)
MCV: 89.7 fL (ref 80–97)
MPV: 7.7 fL (ref 0–99.8)
POC Granulocyte: 7.5 — AB (ref 2–6.9)
POC LYMPH PERCENT: 29.7 %L (ref 10–50)
Platelet Count, POC: 252 10*3/uL (ref 142–424)
RBC: 5.1 M/uL (ref 4.69–6.13)
RDW, POC: 13.2 %
WBC: 11 10*3/uL — AB (ref 4.6–10.2)

## 2013-05-17 MED ORDER — ALLOPURINOL 300 MG PO TABS: 300.0000 mg | ORAL_TABLET | Freq: Every day | ORAL | Status: DC

## 2013-05-17 MED ORDER — CYCLOBENZAPRINE HCL 10 MG PO TABS: 10.0000 mg | ORAL_TABLET | Freq: Three times a day (TID) | ORAL | Status: DC | PRN

## 2013-05-17 MED ORDER — NAPROXEN 500 MG PO TABS: 500.0000 mg | ORAL_TABLET | Freq: Two times a day (BID) | ORAL | Status: DC

## 2013-05-17 MED ORDER — LISINOPRIL 10 MG PO TABS: 10.0000 mg | ORAL_TABLET | Freq: Every day | ORAL | Status: DC

## 2013-05-17 NOTE — Progress Notes (Signed)
   Subjective:    Patient ID: Tyler [name], male    DOB: Nov 05, 1975, 37 y.o.   MRN: [id number]  HPI This 37 y.o. male presents for evaluation of follow up on gout, hypothyroidism, and elevated blood pressure. He has hx of hyperuricemia and needs refills on gout medicine.  He has been having left neck pain after surgery Due strained cervical spine muscles due to thyroidectomy surgery for thyroid cancer in 9/14.  He has been Having persistent neck pain and was taking oxycodone rx'd by ENT surgeon in 9/14.  He wants refills.  He Has been having elevated blood pressure..   Review of Systems C/o neck discomfort and elevated blood pressure.   No chest pain, SOB, HA, dizziness, vision change, N/V, diarrhea, constipation, dysuria, urinary urgency or frequency or rash.  Objective:   Physical Exam  Vital signs noted  Well developed well nourished male.  HEENT - Head atraumatic Normocephalic                Eyes - PERRLA, Conjuctiva - clear Sclera- Clear EOMI                Ears - EAC's Wnl TM's Wnl Gross Hearing WNL                Nose - Nares patent                 Throat - oropharanx wnl Respiratory - Lungs CTA bilateral Cardiac - RRR S1 and S2 without murmur GI - Abdomen soft Nontender and bowel sounds active x 4 Extremities - No edema. Neuro - Grossly intact. MS - TTP left cervical paraspinous muscle     Assessment & Plan:  Gout - Plan: allopurinol (ZYLOPRIM) 300 MG tablet, POCT CBC, CMP14+EGFR, Uric acid  Essential hypertension, benign - Plan: lisinopril (PRINIVIL,ZESTRIL) 10 MG tablet  Cervicalgia - Plan: cyclobenzaprine (FLEXERIL) 10 MG tablet, naproxen (NAPROSYN) 500 MG tablet. Explained doesn't need the narcotic pain medicine and need possibly xrays and PT if not better but Want to take naprosyn and flexeril for neck discomfort first.  Follow up in one week.  Unspecified hypothyroidism - Plan: Thyroid Panel With TSH  Deatra Canter FNP

## 2013-05-18 LAB — CMP14+EGFR
ALT: 27 IU/L (ref 0–44)
AST: 13 IU/L (ref 0–40)
Albumin/Globulin Ratio: 1.7 (ref 1.1–2.5)
Albumin: 4.3 g/dL (ref 3.5–5.5)
Alkaline Phosphatase: 88 IU/L (ref 39–117)
BUN/Creatinine Ratio: 13 (ref 8–19)
BUN: 12 mg/dL (ref 6–20)
CO2: 23 mmol/L (ref 18–29)
Calcium: 9.9 mg/dL (ref 8.7–10.2)
Chloride: 104 mmol/L (ref 97–108)
Creatinine, Ser: 0.94 mg/dL (ref 0.76–1.27)
GFR calc Af Amer: 121 mL/min/{1.73_m2} (ref >59)
GFR calc non Af Amer: 105 mL/min/{1.73_m2} (ref >59)
Globulin, Total: 2.6 g/dL (ref 1.5–4.5)
Glucose: 88 mg/dL (ref 65–99)
Potassium: 4.6 mmol/L (ref 3.5–5.2)
Sodium: 146 mmol/L — ABNORMAL HIGH (ref 134–144)
Total Bilirubin: 0.3 mg/dL (ref 0.0–1.2)
Total Protein: 6.9 g/dL (ref 6.0–8.5)

## 2013-05-18 LAB — THYROID PANEL WITH TSH
Free Thyroxine Index: 3 (ref 1.2–4.9)
T3 Uptake Ratio: 32 % (ref 24–39)
T4, Total: 9.5 ug/dL (ref 4.5–12.0)
TSH: 6.06 u[IU]/mL — ABNORMAL HIGH (ref 0.450–4.500)

## 2013-05-18 LAB — URIC ACID: Uric Acid: 10.7 mg/dL — ABNORMAL HIGH (ref 3.7–8.6)

## 2013-05-22 ENCOUNTER — Ambulatory Visit: Payer: Self-pay | Admitting: Family Medicine

## 2013-05-23 ENCOUNTER — Other Ambulatory Visit: Payer: Self-pay | Admitting: Family Medicine

## 2013-05-23 DIAGNOSIS — M109 Gout, unspecified: Secondary | ICD-10-CM

## 2013-05-23 DIAGNOSIS — E039 Hypothyroidism, unspecified: Secondary | ICD-10-CM

## 2013-05-23 MED ORDER — LEVOTHYROXINE SODIUM 175 MCG PO TABS: 175.0000 ug | ORAL_TABLET | Freq: Every day | ORAL | Status: DC

## 2013-05-23 MED ORDER — ALLOPURINOL 300 MG PO TABS: 300.0000 mg | ORAL_TABLET | Freq: Two times a day (BID) | ORAL | Status: DC

## 2013-05-23 NOTE — Telephone Encounter (Signed)
Message copied by Azalee Course on Tue May 23, 2013 10:19 AM ------      Message from: Deatra Canter      Created: Tue May 23, 2013  8:19 AM       Recommend low purine diet, increase allopurinol up to bid.  Recommend taking allopurinol 300mg  tablet in am and 1/2 tablet in evening.  After a month recommend increasing allopurinol to 300mg  po bid.  Recommend rechecking uric acid in 3 months.  Increase levothyroxine to po qd and recheck thyroid panel in 3 months.  TSH elevated and uric acid elevated.  Wbc's mildly elevated and recommend repeat cbc in 3 months. ------

## 2013-05-23 NOTE — Telephone Encounter (Signed)
LMTCB ON LABS

## 2013-06-13 ENCOUNTER — Telehealth: Payer: Self-pay | Admitting: *Deleted

## 2013-06-27 ENCOUNTER — Encounter: Payer: Self-pay | Admitting: *Deleted

## 2013-08-25 ENCOUNTER — Other Ambulatory Visit: Payer: Self-pay | Admitting: Family Medicine

## 2013-12-19 ENCOUNTER — Encounter: Payer: Self-pay | Admitting: Physician Assistant

## 2013-12-19 ENCOUNTER — Ambulatory Visit (INDEPENDENT_AMBULATORY_CARE_PROVIDER_SITE_OTHER): Payer: BC Managed Care – PPO | Admitting: Physician Assistant

## 2013-12-19 VITALS — BP 150/110 | HR 66 | Temp 98.2°F | Ht 69.0 in | Wt 280.8 lb

## 2013-12-19 DIAGNOSIS — M109 Gout, unspecified: Secondary | ICD-10-CM

## 2013-12-19 DIAGNOSIS — M1009 Idiopathic gout, multiple sites: Secondary | ICD-10-CM

## 2013-12-19 MED ORDER — ALLOPURINOL 300 MG PO TABS: 300.0000 mg | ORAL_TABLET | Freq: Two times a day (BID) | ORAL | Status: DC

## 2013-12-19 NOTE — Patient Instructions (Signed)

## 2013-12-19 NOTE — Progress Notes (Signed)
Subjective:     Patient ID: Tyler [name], male   DOB: 02/01/1976, 38 y.o.   MRN: [id number]  HPI Pt with hx of Gout Taking Allopurinol on a daily basis States he cannot remember his last flare Doing well with hydration and daily meds Pt also had recent appt with his Endocrin  Review of Systems  Constitutional: Negative.   Respiratory: Negative.   Cardiovascular: Negative.   Musculoskeletal: Negative.        Objective:   Physical Exam  Nursing note and vitals reviewed. Constitutional: He appears well-developed and well-nourished.  Neck: No thyromegaly present.  Cardiovascular: Normal rate, regular rhythm, normal heart sounds and intact distal pulses.   Pulmonary/Chest: Effort normal and breath sounds normal.  Lymphadenopathy:    He has no cervical adenopathy.       Assessment:     Gout    Plan:     Stressed importance of hydration Labs done today - will inform of lab results Rf of med Keep regular f/u regarding HTN

## 2013-12-19 NOTE — Addendum Note (Signed)
Addended by: Lodema Pilot on: 12/19/2013 12:30 PM   Modules accepted: Orders

## 2013-12-20 ENCOUNTER — Telehealth: Payer: Self-pay | Admitting: Family Medicine

## 2013-12-20 LAB — CMP14+EGFR
ALBUMIN: 4.2 g/dL (ref 3.5–5.5)
ALK PHOS: 71 IU/L (ref 39–117)
ALT: 33 IU/L (ref 0–44)
AST: 21 IU/L (ref 0–40)
Albumin/Globulin Ratio: 1.7 (ref 1.1–2.5)
BUN / CREAT RATIO: 7 — AB (ref 8–19)
BUN: 7 mg/dL (ref 6–20)
CHLORIDE: 104 mmol/L (ref 97–108)
CO2: 18 mmol/L (ref 18–29)
Calcium: 9.3 mg/dL (ref 8.7–10.2)
Creatinine, Ser: 0.95 mg/dL (ref 0.76–1.27)
GFR calc Af Amer: 119 mL/min/{1.73_m2} (ref >59)
GFR calc non Af Amer: 103 mL/min/{1.73_m2} (ref >59)
Globulin, Total: 2.5 g/dL (ref 1.5–4.5)
Glucose: 98 mg/dL (ref 65–99)
POTASSIUM: 4.8 mmol/L (ref 3.5–5.2)
Sodium: 142 mmol/L (ref 134–144)
Total Bilirubin: <0.2 mg/dL (ref 0.0–1.2)
Total Protein: 6.7 g/dL (ref 6.0–8.5)

## 2013-12-20 NOTE — Telephone Encounter (Signed)
Message copied by Waverly Ferrari on Wed Dec 20, 2013  3:02 PM ------      Message from: Lodema Pilot      Created: Wed Dec 20, 2013 10:04 AM       Labs ok       Continue same      [place] well ------

## 2014-04-23 ENCOUNTER — Other Ambulatory Visit (HOSPITAL_COMMUNITY): Payer: Self-pay | Admitting: "Endocrinology

## 2014-04-23 DIAGNOSIS — C73 Malignant neoplasm of thyroid gland: Secondary | ICD-10-CM

## 2014-07-09 ENCOUNTER — Ambulatory Visit (HOSPITAL_COMMUNITY): Payer: Self-pay

## 2014-07-13 ENCOUNTER — Ambulatory Visit (HOSPITAL_COMMUNITY): Payer: Self-pay

## 2014-07-27 ENCOUNTER — Ambulatory Visit (HOSPITAL_COMMUNITY)
Admission: RE | Admit: 2014-07-27 | Discharge: 2014-07-27 | Disposition: A | Discharge status: Home / Self Care | Payer: BLUE CROSS/BLUE SHIELD | Source: Ambulatory Visit | Attending: "Endocrinology | Admitting: "Endocrinology

## 2014-07-27 DIAGNOSIS — E890 Postprocedural hypothyroidism: Secondary | ICD-10-CM | POA: Diagnosis not present

## 2014-07-27 DIAGNOSIS — C73 Malignant neoplasm of thyroid gland: Secondary | ICD-10-CM | POA: Insufficient documentation

## 2014-07-30 LAB — HEMOGLOBIN A1C: Hgb A1c MFr Bld: 5.9 % (ref 4.0–6.0)

## 2014-11-01 ENCOUNTER — Emergency Department (HOSPITAL_COMMUNITY)
Admission: EM | Admit: 2014-11-01 | Discharge: 2014-11-01 | Disposition: A | Discharge status: Home / Self Care | Payer: BLUE CROSS/BLUE SHIELD | Attending: Emergency Medicine | Admitting: Emergency Medicine

## 2014-11-01 ENCOUNTER — Encounter (HOSPITAL_COMMUNITY): Payer: Self-pay | Admitting: *Deleted

## 2014-11-01 DIAGNOSIS — M109 Gout, unspecified: Secondary | ICD-10-CM | POA: Diagnosis not present

## 2014-11-01 DIAGNOSIS — I10 Essential (primary) hypertension: Secondary | ICD-10-CM | POA: Insufficient documentation

## 2014-11-01 DIAGNOSIS — Z85850 Personal history of malignant neoplasm of thyroid: Secondary | ICD-10-CM | POA: Insufficient documentation

## 2014-11-01 DIAGNOSIS — M25561 Pain in right knee: Secondary | ICD-10-CM | POA: Insufficient documentation

## 2014-11-01 DIAGNOSIS — E780 Pure hypercholesterolemia: Secondary | ICD-10-CM | POA: Diagnosis not present

## 2014-11-01 DIAGNOSIS — Z720 Tobacco use: Secondary | ICD-10-CM | POA: Insufficient documentation

## 2014-11-01 DIAGNOSIS — Z79899 Other long term (current) drug therapy: Secondary | ICD-10-CM | POA: Diagnosis not present

## 2014-11-01 MED ORDER — DEXAMETHASONE SODIUM PHOSPHATE 4 MG/ML IJ SOLN
8.0000 mg | Freq: Once | INTRAMUSCULAR | Status: AC
  Administered 2014-11-01: 8 mg via INTRAMUSCULAR
  Filled 2014-11-01: qty 2

## 2014-11-01 MED ORDER — KETOROLAC TROMETHAMINE 60 MG/2ML IM SOLN
60.0000 mg | Freq: Once | INTRAMUSCULAR | Status: AC
  Administered 2014-11-01: 60 mg via INTRAMUSCULAR
  Filled 2014-11-01: qty 2

## 2014-11-01 NOTE — Discharge Instructions (Signed)
Please usually knee immobilizer and walker until you see Dr. Aline Brochure, or the orthopedic specialist of your choice. Please elevate your knee above your waist is much as possible. Knee Pain The knee is the complex joint between your thigh and your lower leg. It is made up of bones, tendons, ligaments, and cartilage. The bones that make up the knee are:  The femur in the thigh.  The tibia and fibula in the lower leg.  The patella or kneecap riding in the groove on the lower femur. CAUSES  Knee pain is a common complaint with many causes. A few of these causes are:  Injury, such as:  A ruptured ligament or tendon injury.  Torn cartilage.  Medical conditions, such as:  Gout  Arthritis  Infections  Overuse, over training, or overdoing a physical activity. Knee pain can be minor or severe. Knee pain can accompany debilitating injury. Minor knee problems often respond well to self-care measures or get well on their own. More serious injuries may need medical intervention or even surgery. SYMPTOMS The knee is complex. Symptoms of knee problems can vary widely. Some of the problems are:  Pain with movement and weight bearing.  Swelling and tenderness.  Buckling of the knee.  Inability to straighten or extend your knee.  Your knee locks and you cannot straighten it.  Warmth and redness with pain and fever.  Deformity or dislocation of the kneecap. DIAGNOSIS  Determining what is wrong may be very straight forward such as when there is an injury. It can also be challenging because of the complexity of the knee. Tests to make a diagnosis may include:  Your caregiver taking a history and doing a physical exam.  Routine X-rays can be used to rule out other problems. X-rays will not reveal a cartilage tear. Some injuries of the knee can be diagnosed by:  Arthroscopy a surgical technique by which a small video camera is inserted through tiny incisions on the sides of the knee. This  procedure is used to examine and repair internal knee joint problems. Tiny instruments can be used during arthroscopy to repair the torn knee cartilage (meniscus).  Arthrography is a radiology technique. A contrast liquid is directly injected into the knee joint. Internal structures of the knee joint then become visible on X-ray film.  An MRI scan is a non X-ray radiology procedure in which magnetic fields and a computer produce two- or three-dimensional images of the inside of the knee. Cartilage tears are often visible using an MRI scanner. MRI scans have largely replaced arthrography in diagnosing cartilage tears of the knee.  Blood work.  Examination of the fluid that helps to lubricate the knee joint (synovial fluid). This is done by taking a sample out using a needle and a syringe. TREATMENT The treatment of knee problems depends on the cause. Some of these treatments are:  Depending on the injury, proper casting, splinting, surgery, or physical therapy care will be needed.  Give yourself adequate recovery time. Do not overuse your joints. If you begin to get sore during workout routines, back off. Slow down or do fewer repetitions.  For repetitive activities such as cycling or running, maintain your strength and nutrition.  Alternate muscle groups. For example, if you are a weight lifter, work the upper body on one day and the lower body the next.  Either tight or weak muscles do not give the proper support for your knee. Tight or weak muscles do not absorb the stress placed  on the knee joint. Keep the muscles surrounding the knee strong.  Take care of mechanical problems.  If you have flat feet, orthotics or special shoes may help. See your caregiver if you need help.  Arch supports, sometimes with wedges on the inner or outer aspect of the heel, can help. These can shift pressure away from the side of the knee most bothered by osteoarthritis.  A brace called an "unloader" brace  also may be used to help ease the pressure on the most arthritic side of the knee.  If your caregiver has prescribed crutches, braces, wraps or ice, use as directed. The acronym for this is PRICE. This means protection, rest, ice, compression, and elevation.  Nonsteroidal anti-inflammatory drugs (NSAIDs), can help relieve pain. But if taken immediately after an injury, they may actually increase swelling. Take NSAIDs with food in your stomach. Stop them if you develop stomach problems. Do not take these if you have a history of ulcers, stomach pain, or bleeding from the bowel. Do not take without your caregiver's approval if you have problems with fluid retention, heart failure, or kidney problems.  For ongoing knee problems, physical therapy may be helpful.  Glucosamine and chondroitin are over-the-counter dietary supplements. Both may help relieve the pain of osteoarthritis in the knee. These medicines are different from the usual anti-inflammatory drugs. Glucosamine may decrease the rate of cartilage destruction.  Injections of a corticosteroid drug into your knee joint may help reduce the symptoms of an arthritis flare-up. They may provide pain relief that lasts a few months. You may have to wait a few months between injections. The injections do have a small increased risk of infection, water retention, and elevated blood sugar levels.  Hyaluronic acid injected into damaged joints may ease pain and provide lubrication. These injections may work by reducing inflammation. A series of shots may give relief for as long as 6 months.  Topical painkillers. Applying certain ointments to your skin may help relieve the pain and stiffness of osteoarthritis. Ask your pharmacist for suggestions. Many over the-counter products are approved for temporary relief of arthritis pain.  In some countries, doctors often prescribe topical NSAIDs for relief of chronic conditions such as arthritis and tendinitis. A  review of treatment with NSAID creams found that they worked as well as oral medications but without the serious side effects. PREVENTION  Maintain a healthy weight. Extra pounds put more strain on your joints.  Get strong, stay limber. Weak muscles are a common cause of knee injuries. Stretching is important. Include flexibility exercises in your workouts.  Be smart about exercise. If you have osteoarthritis, chronic knee pain or recurring injuries, you may need to change the way you exercise. This does not mean you have to stop being active. If your knees ache after jogging or playing basketball, consider switching to swimming, water aerobics, or other low-impact activities, at least for a few days a week. Sometimes limiting high-impact activities will provide relief.  Make sure your shoes fit well. Choose footwear that is right for your sport.  Protect your knees. Use the proper gear for knee-sensitive activities. Use kneepads when playing volleyball or laying carpet. Buckle your seat belt every time you drive. Most shattered kneecaps occur in car accidents.  Rest when you are tired. SEEK MEDICAL CARE IF:  You have knee pain that is continual and does not seem to be getting better.  SEEK IMMEDIATE MEDICAL CARE IF:  Your knee joint feels hot to the touch  and you have a high fever. MAKE SURE YOU:   Understand these instructions.  Will watch your condition.  Will get help right away if you are not doing well or get worse. Document Released: 04/05/2007 Document Revised: 08/31/2011 Document Reviewed: 04/05/2007 [facility] Patient Information 2015 [place], [place]. This information is not intended to replace advice given to you by your health care provider. Make sure you discuss any questions you have with your health care provider.

## 2014-11-01 NOTE — ED Notes (Signed)
Pt states right knee pain. Seen by Urgent care on Monday, meds prescribed there isn't working (hydrocodne, ibuprofen,  and HCTZ). Taking allopurinol with no relief. Pt states this does not feel like gout pain. Pain began Saturday, NKI.

## 2014-11-01 NOTE — ED Provider Notes (Signed)
CSN: [id number]     Arrival date & time 11/01/14  1259 History   First MD Initiated Contact with Patient 11/01/14 1422     Chief Complaint  Patient presents with  . Knee Pain     (Consider location/radiation/quality/duration/timing/severity/associated sxs/prior Treatment) HPI Comments:  Patient is a 39 year old male presents to the emergency department with complaint of right knee pain. The patient was seen in the local urgent care on Monday, May 9. At which time he was prescribed pain medication and anti-inflammatory medication. He was also given hydrochlorothiazide for his elevated blood pressure. The patient states that he is also taking allopurinol, but this does not seem to be helping his pain. He has had problems with gout in the past but states this does not feel like gout. Pain is worse when he is attempting to walk, bend or twist. Resting the leg does not seem to help much. He presents now for additional evaluation and management.  The history is provided by the patient.    Past Medical History  Diagnosis Date  . High cholesterol   . Gout   . Gout   . Hypertension   . Thyroid disease     thyroid cancer  . Cancer     thyroid   Past Surgical History  Procedure Laterality Date  . Fracture surgery Left 1989    ankle  . Eye surgery Left     cataract removal  . Thyroidectomy N/A 03/03/2013    Procedure: TOTAL THYROIDECTOMY;  Surgeon: Jamesetta So, MD;  Location: [facility];  Service: General;  Laterality: N/A;   No family history on file. History  Substance Use Topics  . Smoking status: Current Every Day Smoker -- 0.50 packs/day for 10 years    Types: Cigarettes  . Smokeless tobacco: Former Systems developer  . Alcohol Use: Yes     Comment: occasional    Review of Systems  Musculoskeletal: Positive for arthralgias.  All other systems reviewed and are negative.     Allergies  Review of patient's allergies indicates no known allergies.  Home Medications   Prior to Admission  medications   Medication Sig Start Date End Date Taking? Authorizing Provider  allopurinol (ZYLOPRIM) 300 MG tablet Take 1 tablet (300 mg total) by mouth 2 (two) times daily. 12/19/13   Lodema Pilot, PA-C  atorvastatin (LIPITOR) 20 MG tablet Take 20 mg by mouth every morning. 11/29/12   Vernie Shanks, MD  fish oil-omega-3 fatty acids 1000 MG capsule Take 1 g by mouth daily.    Historical Provider, MD  levothyroxine (SYNTHROID, LEVOTHROID) 175 MCG tablet Take 200 mcg by mouth daily before breakfast. 05/23/13   Lysbeth Penner, FNP  lisinopril (PRINIVIL,ZESTRIL) 10 MG tablet Take 1 tablet (10 mg total) by mouth daily. 05/17/13   Lysbeth Penner, FNP  naproxen (NAPROSYN) 500 MG tablet TAKE 1 TABLET (500 MG TOTAL) BY MOUTH 2 (TWO) TIMES DAILY WITH A MEAL.    Lysbeth Penner, FNP  oxyCODONE-acetaminophen (PERCOCET) 7.5-325 MG per tablet  03/19/13   Historical Provider, MD   BP 147/110 mmHg  Temp(Src) 98.7 F (37.1 C) (Oral)  Resp 16  Ht 5' 9.5" (1.765 m)  Wt 280 lb (127.007 kg)  BMI 40.77 kg/m2  SpO2 99% Physical Exam  Constitutional: He is oriented to person, place, and time. He appears well-developed and well-nourished.  Non-toxic appearance.  HENT:  Head: Normocephalic.  Right Ear: Tympanic membrane and external ear normal.  Left Ear: Tympanic membrane and  external ear normal.  Eyes: EOM and lids are normal. Pupils are equal, round, and reactive to light.  Neck: Normal range of motion. Neck supple. Carotid bruit is not present.  Cardiovascular: Normal rate, regular rhythm, normal heart sounds, intact distal pulses and normal pulses.   Pulmonary/Chest: Breath sounds normal. No respiratory distress.  Abdominal: Soft. Bowel sounds are normal. There is no tenderness. There is no guarding.  Musculoskeletal:       Right knee: He exhibits decreased range of motion. He exhibits no effusion, no deformity and no erythema. Tenderness found. No medial joint line and no patellar tendon tenderness  noted.  Lymphadenopathy:       Head (right side): No submandibular adenopathy present.       Head (left side): No submandibular adenopathy present.    He has no cervical adenopathy.  Neurological: He is alert and oriented to person, place, and time. He has normal strength. No cranial nerve deficit or sensory deficit.  Skin: Skin is warm and dry.  Psychiatric: He has a normal mood and affect. His speech is normal.  Nursing note and vitals reviewed.   ED Course  Procedures (including critical care time) Labs Review Labs Reviewed - No data to display  Imaging Review No results found.   EKG Interpretation None      MDM  I have reviewed the x-rays from urgent care on yesterday. There are degenerative changes present. No fracture or dislocation. And no effusion noted.   Joints appreciated. No high fever appreciated. This no swelling of the posterior knee.  I have instructed the patient that he possibly has some internal derangement involving the knee. I strongly advised that he see orthopedics as sone as possible. I placed the patient in a knee immobilizer. Crutches were offered, but the patient declined. The patient is currently on Norco and anti-inflammatory medication. He was given an injection of Decadron and an injection of Toradol in the emergency department. The patient will continue his current medications.    Final diagnoses:  Knee pain, right    **I have reviewed nursing notes, vital signs, and all appropriate lab and imaging results for this patient.Lily Kocher, PA-C 11/06/14 [place], DO 11/07/14 1109

## 2015-02-09 ENCOUNTER — Encounter: Payer: Self-pay | Admitting: "Endocrinology

## 2015-02-09 DIAGNOSIS — E890 Postprocedural hypothyroidism: Secondary | ICD-10-CM | POA: Insufficient documentation

## 2015-03-13 ENCOUNTER — Telehealth: Payer: Self-pay | Admitting: Family Medicine

## 2015-03-13 ENCOUNTER — Encounter: Payer: Self-pay | Admitting: Pediatrics

## 2015-03-13 ENCOUNTER — Ambulatory Visit (INDEPENDENT_AMBULATORY_CARE_PROVIDER_SITE_OTHER): Payer: Self-pay | Admitting: Pediatrics

## 2015-03-13 VITALS — BP 142/104 | HR 79 | Temp 97.9°F | Ht 70.0 in | Wt 290.2 lb

## 2015-03-13 DIAGNOSIS — R030 Elevated blood-pressure reading, without diagnosis of hypertension: Secondary | ICD-10-CM

## 2015-03-13 DIAGNOSIS — M109 Gout, unspecified: Secondary | ICD-10-CM | POA: Insufficient documentation

## 2015-03-13 DIAGNOSIS — M10072 Idiopathic gout, left ankle and foot: Secondary | ICD-10-CM

## 2015-03-13 DIAGNOSIS — IMO0001 Reserved for inherently not codable concepts without codable children: Secondary | ICD-10-CM

## 2015-03-13 DIAGNOSIS — E1159 Type 2 diabetes mellitus with other circulatory complications: Secondary | ICD-10-CM | POA: Insufficient documentation

## 2015-03-13 DIAGNOSIS — I10 Essential (primary) hypertension: Secondary | ICD-10-CM | POA: Insufficient documentation

## 2015-03-13 DIAGNOSIS — I152 Hypertension secondary to endocrine disorders: Secondary | ICD-10-CM | POA: Insufficient documentation

## 2015-03-13 MED ORDER — COLCHICINE 0.6 MG PO TABS: 0.6000 mg | ORAL_TABLET | Freq: Every day | ORAL | Status: DC

## 2015-03-13 NOTE — Patient Instructions (Addendum)
Recommend getting onto an alternate blood pressure medicine to HCTZ as this can increase your risk of gout flares.  Gout Gout is an inflammatory arthritis caused by a buildup of uric acid crystals in the joints. Uric acid is a chemical that is normally present in the blood. When the level of uric acid in the blood is too high it can form crystals that deposit in your joints and tissues. This causes joint redness, soreness, and swelling (inflammation). Repeat attacks are common. Over time, uric acid crystals can form into masses (tophi) near a joint, destroying bone and causing disfigurement. Gout is treatable and often preventable. CAUSES  The disease begins with elevated levels of uric acid in the blood. Uric acid is produced by your body when it breaks down a naturally found substance called purines. Certain foods you eat, such as meats and fish, contain high amounts of purines. Causes of an elevated uric acid level include:  Being passed down from parent to child (heredity).  Diseases that cause increased uric acid production (such as obesity, psoriasis, and certain cancers).  Excessive alcohol use.  Diet, especially diets rich in meat and seafood.  Medicines, including certain cancer-fighting medicines (chemotherapy), water pills (diuretics), and aspirin.  Chronic kidney disease. The kidneys are no longer able to remove uric acid well.  Problems with metabolism. Conditions strongly associated with gout include:  Obesity.  High blood pressure.  High cholesterol.  Diabetes. Not everyone with elevated uric acid levels gets gout. It is not understood why some people get gout and others do not. Surgery, joint injury, and eating too much of certain foods are some of the factors that can lead to gout attacks. SYMPTOMS   An attack of gout comes on quickly. It causes intense pain with redness, swelling, and warmth in a joint.  Fever can occur.  Often, only one joint is involved.  Certain joints are more commonly involved:  Base of the big toe.  Knee.  Ankle.  Wrist.  Finger. Without treatment, an attack usually goes away in a few days to weeks. Between attacks, you usually will not have symptoms, which is different from many other forms of arthritis. DIAGNOSIS  Your caregiver will suspect gout based on your symptoms and exam. In some cases, tests may be recommended. The tests may include:  Blood tests.  Urine tests.  X-rays.  Joint fluid exam. This exam requires a needle to remove fluid from the joint (arthrocentesis). Using a microscope, gout is confirmed when uric acid crystals are seen in the joint fluid. TREATMENT  There are two phases to gout treatment: treating the sudden onset (acute) attack and preventing attacks (prophylaxis).  Treatment of an Acute Attack.  Medicines are used. These include anti-inflammatory medicines or steroid medicines.  An injection of steroid medicine into the affected joint is sometimes necessary.  The painful joint is rested. Movement can worsen the arthritis.  You may use warm or cold treatments on painful joints, depending which works best for you.  Treatment to Prevent Attacks.  If you suffer from frequent gout attacks, your caregiver may advise preventive medicine. These medicines are started after the acute attack subsides. These medicines either help your kidneys eliminate uric acid from your body or decrease your uric acid production. You may need to stay on these medicines for a very long time.  The early phase of treatment with preventive medicine can be associated with an increase in acute gout attacks. For this reason, during the first few months  of treatment, your caregiver may also advise you to take medicines usually used for acute gout treatment. Be sure you understand your caregiver's directions. Your caregiver may make several adjustments to your medicine dose before these medicines are  effective.  Discuss dietary treatment with your caregiver or dietitian. Alcohol and drinks high in sugar and fructose and foods such as meat, poultry, and seafood can increase uric acid levels. Your caregiver or dietitian can advise you on drinks and foods that should be limited. HOME CARE INSTRUCTIONS   Do not take aspirin to relieve pain. This raises uric acid levels.  Only take over-the-counter or prescription medicines for pain, discomfort, or fever as directed by your caregiver.  Rest the joint as much as possible. When in bed, keep sheets and blankets off painful areas.  Keep the affected joint raised (elevated).  Apply warm or cold treatments to painful joints. Use of warm or cold treatments depends on which works best for you.  Use crutches if the painful joint is in your leg.  Drink enough fluids to keep your urine clear or pale yellow. This helps your body get rid of uric acid. Limit alcohol, sugary drinks, and fructose drinks.  Follow your dietary instructions. Pay careful attention to the amount of protein you eat. Your daily diet should emphasize fruits, vegetables, whole grains, and fat-free or low-fat milk products. Discuss the use of coffee, vitamin C, and cherries with your caregiver or dietitian. These may be helpful in lowering uric acid levels.  Maintain a healthy body weight. SEEK MEDICAL CARE IF:   You develop diarrhea, vomiting, or any side effects from medicines.  You do not feel better in 24 hours, or you are getting worse. SEEK IMMEDIATE MEDICAL CARE IF:   Your joint becomes suddenly more tender, and you have chills or a fever. MAKE SURE YOU:   Understand these instructions.  Will watch your condition.  Will get help right away if you are not doing well or get worse. Document Released: 06/05/2000 Document Revised: 10/23/2013 Document Reviewed: 01/20/2012 [facility] Patient Information 2015 [place], [place]. This information is not intended to replace  advice given to you by your health care provider. Make sure you discuss any questions you have with your health care provider.

## 2015-03-13 NOTE — Progress Notes (Signed)
Subjective:    Patient ID: Tyler [name], male    DOB: 1976-01-02, 39 y.o.   MRN: [id number]  HPI: Tyler [name] is a 39 y.o. male presenting on 03/13/2015 for Gout  Apprx 5 days ago started having pain in his R foot. Gets dehydrated at work. On allopurinol, has had problems with gout for past 10 years. Usually flares come over the summer when he is working more outside. Last flare at beginning of summer, a couple of months ago. Usually in one foot or the other, has not had other joint involvement. Has been on HCTZ for several years. Sheet on foot doesn't hurt, but any ROM at ankle, any weight bearing causes great pain. No fevers. Woke up one morning with pain in ankle. No recent trauma/injury to ankle.  Dr. Nori Riis in [place] prescribes his blood pressure medicines.   Relevant past medical, surgical, family and social history reviewed and updated as indicated. Interim medical history since our last visit reviewed. Allergies and medications reviewed and updated.   ROS: Per HPI unless specifically indicated above  Past Medical History Patient Active Problem List   Diagnosis Date Noted  . Essential hypertension 03/13/2015  . Acute gout 03/13/2015  . Postsurgical hypothyroidism 02/09/2015  . Lactic acid acidosis 12/21/2012  . Tobacco abuse 12/21/2012  . Dehydration 12/20/2012  . ARF (acute renal failure) 12/20/2012  . Severe obesity (BMI >= 40) 12/20/2012  . Hyperglycemia 12/20/2012    Current Outpatient Prescriptions  Medication Sig Dispense Refill  . allopurinol (ZYLOPRIM) 300 MG tablet Take 1 tablet (300 mg total) by mouth 2 (two) times daily. 30 tablet 11  . levothyroxine (SYNTHROID, LEVOTHROID) 200 MCG tablet Take 200 mcg by mouth daily before breakfast.    . lisinopril-hydrochlorothiazide (PRINZIDE,ZESTORETIC) 20-25 MG per tablet Take 1 tablet by mouth daily.    . colchicine 0.6 MG tablet Take 1 tablet (0.6 mg total) by mouth daily. 30 tablet 1   No current  facility-administered medications for this visit.       Objective:    BP 142/104 mmHg  Pulse 79  Temp(Src) 97.9 F (36.6 C) (Oral)  Ht 5\' 10"  (1.778 m)  Wt 290 lb 3.2 oz (131.634 kg)  BMI 41.64 kg/m2  Wt Readings from Last 3 Encounters:  03/13/15 290 lb 3.2 oz (131.634 kg)  09/21/14 284 lb (128.822 kg)  11/01/14 280 lb (127.007 kg)     Gen: NAD, alert, cooperative with exam, NCAT EYES: EOMI, no scleral injection or icterus CV: NRRR, normal S1/S2, no murmur, DP pulses 2+ b/l Resp: CTABL, no wheezes, normal WOB Abd: +BS, soft, NTND. no guarding or organomegaly Neuro: Alert and oriented MSK: L ankle red, hot, swollen compared with R. Pain with minimal ROM at ankle. Can wiggle toes fine.     Assessment & Plan:    Khriz was seen today for gout. Start colchicine today. After two weeks from when ankle heals can restart allopurinol. Pt BP elevated today, he is in some pain from ankle/gout. He remains on HCTZ which can increase incidence on gout flares in pts prone to gout. Pt says Dr. Nori Riis in [place] prescribes his blood pressure medicines and pt is not interested in [place] changing his meds at this time even if it might help prevent gout flares. Discussed he needs to see Dr. Nori Riis soon then for BP recheck. If he is not able to get in to be seen, he will come back here for BP recheck.  Diagnoses and all  orders for this visit:  Acute gout of left foot, unspecified cause -     colchicine 0.6 MG tablet; Take 1 tablet (0.6 mg total) by mouth daily.  Essential hypertension Ideally should not be on HCTZ for BP given h/o gout. He will f/u with Dr. Nori Riis.  Elevated blood pressure   Follow up plan: As needed. Need to see either [place] or his other doctor who prescribes his BP meds in next two weeks for BP recheck.  Assunta Found, MD [facility] 03/13/2015, 12:42 PM

## 2015-03-29 ENCOUNTER — Ambulatory Visit: Payer: Self-pay | Admitting: "Endocrinology

## 2015-05-24 ENCOUNTER — Ambulatory Visit (INDEPENDENT_AMBULATORY_CARE_PROVIDER_SITE_OTHER): Payer: BLUE CROSS/BLUE SHIELD | Admitting: *Deleted

## 2015-05-24 VITALS — BP 125/88

## 2015-05-24 DIAGNOSIS — I10 Essential (primary) hypertension: Secondary | ICD-10-CM

## 2015-05-24 NOTE — Progress Notes (Signed)
Pt here today for BP check. 

## 2015-06-07 ENCOUNTER — Other Ambulatory Visit (HOSPITAL_COMMUNITY): Payer: Self-pay

## 2015-06-07 ENCOUNTER — Encounter: Payer: Self-pay | Admitting: Pediatrics

## 2015-06-07 ENCOUNTER — Ambulatory Visit (INDEPENDENT_AMBULATORY_CARE_PROVIDER_SITE_OTHER): Payer: BLUE CROSS/BLUE SHIELD

## 2015-06-07 ENCOUNTER — Ambulatory Visit (INDEPENDENT_AMBULATORY_CARE_PROVIDER_SITE_OTHER): Payer: BLUE CROSS/BLUE SHIELD | Admitting: Pediatrics

## 2015-06-07 VITALS — BP 172/117 | HR 90 | Temp 97.8°F | Ht 70.0 in | Wt 303.2 lb

## 2015-06-07 DIAGNOSIS — M109 Gout, unspecified: Secondary | ICD-10-CM | POA: Insufficient documentation

## 2015-06-07 DIAGNOSIS — I10 Essential (primary) hypertension: Secondary | ICD-10-CM | POA: Diagnosis not present

## 2015-06-07 DIAGNOSIS — M25531 Pain in right wrist: Secondary | ICD-10-CM

## 2015-06-07 MED ORDER — MELOXICAM 15 MG PO TABS: 15.0000 mg | ORAL_TABLET | Freq: Every day | ORAL | Status: DC

## 2015-06-07 NOTE — Patient Instructions (Signed)
Check Blood pressure at home every day, write it down, bring to clinic in 4 weeks when you see either me or Dr Nori Riis  Ask for a copy of your labs next time you see Dr. Nori Riis so we can have a copy

## 2015-06-07 NOTE — Progress Notes (Signed)
Subjective:    Patient ID: Tyler [name], male    DOB: 03/14/1976, 39 y.o.   MRN: [id number]  CC: Wrist Pain   HPI: Tyler [name] is a 39 y.o. male presenting for Wrist Pain  Has had pain in R wrist for ten years from overuse. H/o breakin git at some point. Not anything he does hurts it At end of day it is hurting so much cant hold a glass Has to twist, hammer, push, pull, shove all makes it hurt. Pain worsens throughout the day. 2-4th finger swells at end of day, pain shoots from fingers to elbow Tried advil, takes 600mg  once a day at night  BPs at home 120s-130s/80s-90s  Has not been back to see Dr. Nori Riis for f/u thyroid/BP   Depression screen [facility] 2/9 06/07/2015 03/13/2015  Decreased Interest 0 0  Down, Depressed, Hopeless 0 0  PHQ - 2 Score 0 0     Relevant past medical, surgical, family and social history reviewed and updated as indicated. Interim medical history since our last visit reviewed. Allergies and medications reviewed and updated.    ROS: Per HPI unless specifically indicated above  History  Smoking status  . Current Every Day Smoker -- 0.50 packs/day for 10 years  . Types: Cigarettes  Smokeless tobacco  . Former Systems developer    Past Medical History Patient Active Problem List   Diagnosis Date Noted  . Essential hypertension 03/13/2015  . Acute gout 03/13/2015  . Postsurgical hypothyroidism 02/09/2015  . Lactic acid acidosis 12/21/2012  . Tobacco abuse 12/21/2012  . Dehydration 12/20/2012  . ARF (acute renal failure) ([place]) 12/20/2012  . Severe obesity (BMI >= 40) ([place]) 12/20/2012  . Hyperglycemia 12/20/2012    Current Outpatient Prescriptions  Medication Sig Dispense Refill  . allopurinol (ZYLOPRIM) 300 MG tablet Take 1 tablet (300 mg total) by mouth 2 (two) times daily. 30 tablet 11  . levothyroxine (SYNTHROID, LEVOTHROID) 200 MCG tablet Take 200 mcg by mouth daily before breakfast.    . lisinopril-hydrochlorothiazide (PRINZIDE,ZESTORETIC)  20-25 MG per tablet Take 1 tablet by mouth daily.    . meloxicam (MOBIC) 15 MG tablet Take 1 tablet (15 mg total) by mouth daily. 30 tablet 0   No current facility-administered medications for this visit.       Objective:    BP 172/117 mmHg  Pulse 90  Temp(Src) 97.8 F (36.6 C) (Oral)  Ht 5\' 10"  (1.778 m)  Wt 303 lb 3.2 oz (137.531 kg)  BMI 43.50 kg/m2  Wt Readings from Last 3 Encounters:  06/07/15 303 lb 3.2 oz (137.531 kg)  03/13/15 290 lb 3.2 oz (131.634 kg)  09/21/14 284 lb (128.822 kg)     Gen: NAD, alert, cooperative with exam, NCAT EYES: EOMI, no scleral injection or icterus ENT:  TMs pearly gray b/l, OP without erythema LYMPH: no cervical LAD CV: NRRR, normal S1/S2, no murmur, distal pulses 2+ b/l Resp: CTABL, no wheezes, normal WOB Abd: +BS, soft, NTND. no guarding or organomegaly Ext: No edema, warm Neuro: Alert and oriented, strength equal b/l UE and LE, coordination grossly normal MSK: R wrist with 10-20 degree ROM from flex to ext, both decreased compared to L wrist. Not able to deviate hand to radial side from neutral, minimal active ROM to ulnar side. Not limited by pain with ROM. No redness or swelling in wrist. Normal finger ext/flex. Some pain with flex against pressure.     Assessment & Plan:    Quintavis was seen  today for wrist pain, has injured wrist before. Has h/o gout but do not think this is gout flare, wrist is not red or inflamed, pain has been ongoing, hurts increasingly throughout the day when he is using it regularly. Has injured wrist in past, possible this is worsening arthritis. No acute fracture on Wrist film. Continue to wear splint.  Diagnoses and all orders for this visit:  Right wrist pain see above -     meloxicam (MOBIC) 15 MG tablet; Take 1 tablet (15 mg total) by mouth daily. -     DG Wrist Complete Right; Future -     Ambulatory referral to Sports Medicine  Elevated blood pressure  Gets his BP meds form his endocrinologist. Has not  recently seen his endocrinologist. BPs at home are always better controlled per pt than in the office, though continues to have very elevated numbers here. He is taking lisinopril/hctz every day. Asymptomatic. Pt is going to take his blood pressure daily, write it down, bring it back in 4 weeks to see me or Dr. Nori Riis to discuss his blood pressure. He has not had labs checked here in over a year. He says Dr. Nori Riis checks them every 6 months, pt to ask for copy of labs next time he is there.  Gout  No active flares. Stop daily colchicine. Continue allopurinol.   Follow up plan: Return in about 4 weeks (around 07/05/2015).  Assunta Found, MD [facility] 06/07/2015, 11:13 AM

## 2015-06-21 ENCOUNTER — Other Ambulatory Visit: Payer: Self-pay | Admitting: "Endocrinology

## 2015-06-21 DIAGNOSIS — C73 Malignant neoplasm of thyroid gland: Secondary | ICD-10-CM

## 2015-06-28 ENCOUNTER — Other Ambulatory Visit: Payer: Self-pay | Admitting: "Endocrinology

## 2015-06-28 ENCOUNTER — Ambulatory Visit: Payer: Self-pay | Admitting: "Endocrinology

## 2015-06-28 DIAGNOSIS — C73 Malignant neoplasm of thyroid gland: Secondary | ICD-10-CM

## 2015-07-01 ENCOUNTER — Encounter (HOSPITAL_COMMUNITY): Payer: Self-pay

## 2015-07-01 ENCOUNTER — Inpatient Hospital Stay (HOSPITAL_COMMUNITY)
Admission: RE | Admit: 2015-07-01 | Discharge: 2015-07-01 | Disposition: A | Discharge status: Home / Self Care | Payer: BLUE CROSS/BLUE SHIELD | Source: Ambulatory Visit | Attending: "Endocrinology | Admitting: "Endocrinology

## 2015-07-01 ENCOUNTER — Encounter (HOSPITAL_COMMUNITY): Admission: RE | Admit: 2015-07-01 | Payer: BLUE CROSS/BLUE SHIELD | Source: Ambulatory Visit

## 2015-07-01 ENCOUNTER — Encounter (HOSPITAL_COMMUNITY)
Admission: RE | Admit: 2015-07-01 | Discharge: 2015-07-01 | Disposition: A | Discharge status: Home / Self Care | Payer: BLUE CROSS/BLUE SHIELD | Source: Ambulatory Visit | Attending: "Endocrinology | Admitting: "Endocrinology

## 2015-07-01 DIAGNOSIS — C73 Malignant neoplasm of thyroid gland: Secondary | ICD-10-CM | POA: Insufficient documentation

## 2015-07-01 MED ORDER — THYROTROPIN ALFA 1.1 MG IM SOLR: 0.9000 mg | INTRAMUSCULAR | Status: AC

## 2015-07-01 MED ORDER — STERILE WATER FOR INJECTION IJ SOLN
INTRAMUSCULAR | Status: AC
  Filled 2015-07-01: qty 10

## 2015-07-01 MED ORDER — THYROTROPIN ALFA 1.1 MG IM SOLR
INTRAMUSCULAR | Status: AC
  Administered 2015-07-01: 12:00:00 via INTRAMUSCULAR
  Filled 2015-07-01: qty 0.9

## 2015-07-02 ENCOUNTER — Encounter (HOSPITAL_COMMUNITY): Payer: BLUE CROSS/BLUE SHIELD

## 2015-07-02 ENCOUNTER — Encounter (HOSPITAL_COMMUNITY)
Admission: RE | Admit: 2015-07-02 | Discharge: 2015-07-02 | Disposition: A | Discharge status: Home / Self Care | Payer: BLUE CROSS/BLUE SHIELD | Source: Ambulatory Visit | Attending: "Endocrinology | Admitting: "Endocrinology

## 2015-07-02 ENCOUNTER — Ambulatory Visit (HOSPITAL_COMMUNITY): Payer: Self-pay

## 2015-07-02 DIAGNOSIS — C73 Malignant neoplasm of thyroid gland: Secondary | ICD-10-CM | POA: Diagnosis not present

## 2015-07-02 MED ORDER — THYROTROPIN ALFA 1.1 MG IM SOLR
INTRAMUSCULAR | Status: AC
  Administered 2015-07-02: 0.9 mg via INTRAMUSCULAR
  Filled 2015-07-02: qty 0.9

## 2015-07-02 MED ORDER — THYROTROPIN ALFA 1.1 MG IM SOLR
0.9000 mg | INTRAMUSCULAR | Status: AC
  Administered 2015-07-02: 0.9 mg via INTRAMUSCULAR

## 2015-07-03 ENCOUNTER — Encounter (HOSPITAL_COMMUNITY): Payer: BLUE CROSS/BLUE SHIELD

## 2015-07-03 ENCOUNTER — Encounter (HOSPITAL_COMMUNITY)
Admission: RE | Admit: 2015-07-03 | Discharge: 2015-07-03 | Disposition: A | Discharge status: Home / Self Care | Payer: BLUE CROSS/BLUE SHIELD | Source: Ambulatory Visit | Attending: "Endocrinology | Admitting: "Endocrinology

## 2015-07-03 ENCOUNTER — Ambulatory Visit (HOSPITAL_COMMUNITY): Payer: Self-pay

## 2015-07-03 MED ORDER — SODIUM IODIDE I 131 CAPSULE
4.0000 | Freq: Once | INTRAVENOUS | Status: AC | PRN
  Administered 2015-07-03: 4 via ORAL

## 2015-07-05 ENCOUNTER — Encounter (HOSPITAL_COMMUNITY): Payer: Self-pay

## 2015-07-05 ENCOUNTER — Ambulatory Visit: Payer: BLUE CROSS/BLUE SHIELD | Admitting: Pediatrics

## 2015-07-05 ENCOUNTER — Encounter (HOSPITAL_COMMUNITY)
Admission: RE | Admit: 2015-07-05 | Discharge: 2015-07-05 | Disposition: A | Discharge status: Home / Self Care | Payer: BLUE CROSS/BLUE SHIELD | Source: Ambulatory Visit | Attending: "Endocrinology | Admitting: "Endocrinology

## 2015-07-05 DIAGNOSIS — C73 Malignant neoplasm of thyroid gland: Secondary | ICD-10-CM | POA: Diagnosis not present

## 2015-07-06 LAB — THYROGLOBULIN ANTIBODY: Thyroglobulin Ab: 1 IU/mL (ref <2)

## 2015-07-06 LAB — THYROGLOBULIN LEVEL: THYROGLOBULIN: 0.3 ng/mL — AB (ref 2.8–40.9)

## 2015-07-12 ENCOUNTER — Ambulatory Visit: Payer: Self-pay | Admitting: Family Medicine

## 2015-08-26 ENCOUNTER — Other Ambulatory Visit: Payer: Self-pay | Admitting: "Endocrinology

## 2015-09-01 ENCOUNTER — Other Ambulatory Visit: Payer: Self-pay | Admitting: "Endocrinology

## 2015-09-24 ENCOUNTER — Encounter: Payer: Self-pay | Admitting: Family Medicine

## 2015-09-24 ENCOUNTER — Encounter (INDEPENDENT_AMBULATORY_CARE_PROVIDER_SITE_OTHER): Payer: Self-pay

## 2015-09-24 ENCOUNTER — Ambulatory Visit (INDEPENDENT_AMBULATORY_CARE_PROVIDER_SITE_OTHER): Payer: BLUE CROSS/BLUE SHIELD | Admitting: Family Medicine

## 2015-09-24 ENCOUNTER — Encounter: Payer: Self-pay | Admitting: *Deleted

## 2015-09-24 VITALS — BP 158/98 | HR 95 | Temp 97.8°F | Ht 70.0 in | Wt 290.0 lb

## 2015-09-24 DIAGNOSIS — J029 Acute pharyngitis, unspecified: Secondary | ICD-10-CM

## 2015-09-24 LAB — VERITOR FLU A/B WAIVED
INFLUENZA A: NEGATIVE
Influenza B: NEGATIVE

## 2015-09-24 LAB — CULTURE, GROUP A STREP

## 2015-09-24 LAB — RAPID STREP SCREEN (MED CTR MEBANE ONLY): STREP GP A AG, IA W/REFLEX: NEGATIVE

## 2015-09-24 NOTE — Progress Notes (Signed)
   Subjective:    Patient ID: Tyler [name], male    DOB: 1976/04/05, 40 y.o.   MRN: [id number]  HPI Patient here today for sore throat and cough that started over the weekend. He has had no fever. Cough is nonproductive. He has had some headache more on the left side. Denies myalgias.     Patient Active Problem List   Diagnosis Date Noted  . Gout 06/07/2015  . Right wrist pain 06/07/2015  . Essential hypertension 03/13/2015  . Acute gout 03/13/2015  . Postsurgical hypothyroidism 02/09/2015  . Lactic acid acidosis 12/21/2012  . Tobacco abuse 12/21/2012  . Severe obesity (BMI >= 40) ([place]) 12/20/2012  . Hyperglycemia 12/20/2012   Outpatient Encounter Prescriptions as of 09/24/2015  Medication Sig  . levothyroxine (SYNTHROID, LEVOTHROID) 200 MCG tablet TAKE 1 TABLET BY MOUTH EVERY MORNING  . lisinopril-hydrochlorothiazide (PRINZIDE,ZESTORETIC) 20-25 MG tablet TAKE 1 TABLET EVERY DAY  . [DISCONTINUED] allopurinol (ZYLOPRIM) 300 MG tablet Take 1 tablet (300 mg total) by mouth 2 (two) times daily. (Patient not taking: Reported on 09/24/2015)  . [DISCONTINUED] meloxicam (MOBIC) 15 MG tablet Take 1 tablet (15 mg total) by mouth daily.   No facility-administered encounter medications on file as of 09/24/2015.      Review of Systems  Constitutional: Negative.   HENT: Positive for congestion, sinus pressure and sore throat.   Eyes: Negative.   Respiratory: Positive for cough.   Cardiovascular: Negative.   Gastrointestinal: Negative.   Endocrine: Negative.   Genitourinary: Negative.   Musculoskeletal: Negative.   Skin: Negative.   Allergic/Immunologic: Negative.   Neurological: Negative.   Hematological: Negative.   Psychiatric/Behavioral: Negative.        Objective:   Physical Exam  Constitutional: He is oriented to person, place, and time. He appears well-developed and well-nourished.  HENT:  Head: Normocephalic.  Mouth/Throat: Oropharynx is clear and moist.  Cardiovascular:  Normal rate and regular rhythm.   Pulmonary/Chest: Effort normal and breath sounds normal.  Neurological: He is alert and oriented to person, place, and time.  Psychiatric: He has a normal mood and affect. His behavior is normal.   BP 158/98 mmHg  Pulse 95  Temp(Src) 97.8 F (36.6 C) (Oral)  Ht 5\' 10"  (1.778 m)  Wt 290 lb (131.543 kg)  BMI 41.61 kg/m2        Assessment & Plan:  1. Sore throat Strep screen and flu tests are negative. There is not enough on history or exam to treat for sinus infection. Recommend Flonase. Call us if mucus becomes darker or blood-tinged. - Rapid strep screen (not at [facility]) - Veritor Flu A/B Waived  Wardell Honour MD

## 2015-11-26 ENCOUNTER — Other Ambulatory Visit: Payer: Self-pay | Admitting: Pediatrics

## 2016-02-26 ENCOUNTER — Other Ambulatory Visit: Payer: Self-pay | Admitting: "Endocrinology

## 2016-02-26 ENCOUNTER — Other Ambulatory Visit: Payer: Self-pay

## 2016-02-26 ENCOUNTER — Telehealth: Payer: Self-pay | Admitting: "Endocrinology

## 2016-02-26 DIAGNOSIS — E559 Vitamin D deficiency, unspecified: Secondary | ICD-10-CM

## 2016-02-26 DIAGNOSIS — R739 Hyperglycemia, unspecified: Secondary | ICD-10-CM

## 2016-02-26 DIAGNOSIS — E039 Hypothyroidism, unspecified: Secondary | ICD-10-CM

## 2016-02-26 MED ORDER — LEVOTHYROXINE SODIUM 200 MCG PO TABS: 200.0000 ug | ORAL_TABLET | Freq: Every morning | ORAL | 1 refills | Status: DC

## 2016-02-26 NOTE — Telephone Encounter (Signed)
Returning pt's call. 

## 2016-02-28 LAB — TSH: TSH: 9.18 m[IU]/L — AB (ref 0.40–4.50)

## 2016-02-28 LAB — T4, FREE: Free T4: 1.3 ng/dL (ref 0.8–1.8)

## 2016-02-29 LAB — VITAMIN D 25 HYDROXY (VIT D DEFICIENCY, FRACTURES): Vit D, 25-Hydroxy: 31 ng/mL (ref 30–100)

## 2016-02-29 LAB — HEMOGLOBIN A1C
HEMOGLOBIN A1C: 6 % — AB (ref <5.7)
MEAN PLASMA GLUCOSE: 126 mg/dL

## 2016-03-02 ENCOUNTER — Other Ambulatory Visit: Payer: Self-pay | Admitting: "Endocrinology

## 2016-03-19 ENCOUNTER — Ambulatory Visit (INDEPENDENT_AMBULATORY_CARE_PROVIDER_SITE_OTHER): Payer: BLUE CROSS/BLUE SHIELD | Admitting: Family Medicine

## 2016-03-19 ENCOUNTER — Encounter: Payer: Self-pay | Admitting: Family Medicine

## 2016-03-19 ENCOUNTER — Ambulatory Visit (INDEPENDENT_AMBULATORY_CARE_PROVIDER_SITE_OTHER): Payer: BLUE CROSS/BLUE SHIELD

## 2016-03-19 VITALS — BP 166/106 | HR 84 | Temp 98.8°F | Ht 70.0 in | Wt 291.6 lb

## 2016-03-19 DIAGNOSIS — M79671 Pain in right foot: Secondary | ICD-10-CM

## 2016-03-19 DIAGNOSIS — I10 Essential (primary) hypertension: Secondary | ICD-10-CM | POA: Diagnosis not present

## 2016-03-19 DIAGNOSIS — M109 Gout, unspecified: Secondary | ICD-10-CM

## 2016-03-19 MED ORDER — LISINOPRIL 10 MG PO TABS: 10.0000 mg | ORAL_TABLET | Freq: Every day | ORAL | 3 refills | Status: DC

## 2016-03-19 MED ORDER — NAPROXEN 500 MG PO TABS: 500.0000 mg | ORAL_TABLET | Freq: Two times a day (BID) | ORAL | 0 refills | Status: DC

## 2016-03-19 NOTE — Patient Instructions (Addendum)
Great to meet you!  Naprosyn for pain, we will call you with x-ray results.  We will also call you with lab results. My suspicion is that you  need preventative gout medicine  Please come back in about 4 weeks

## 2016-03-19 NOTE — Progress Notes (Signed)
   HPI  Patient presents today here with right foot pain, hypertension, and discussed gout.  Right foot pain Has been going on about 2 months, worse over the last 2 weeks. Describes right lateral foot pain which is followed by swelling and tenderness. After his right foot begins to swell he also gets swelling of the left foot. It's worse when he is been on his feet all day.  He works for the [employer] on [employer] He's been using ibuprofen with mild improvement.  He does have history of gout, however this is a different type of pain than his previous gout flares, also usually is able to get by with only ibuprofen for gout.  Discrete injury, feels better and sHOES   PMH: Smoking status noted ROS: Per HPI  Objective: BP (!) 166/106   Pulse 84   Temp 98.8 F (37.1 C) (Oral)   Ht 5\' 10"  (1.778 m)   Wt 291 lb 9.6 oz (132.3 kg)   BMI 41.84 kg/m  Gen: NAD, alert, cooperative with exam HEENT: NCAT CV: RRR, good S1/S2, no murmur Resp: CTABL, no wheezes, non-labored Ext: No edema, warm Neuro: Alert and oriented Foot/musculoskeletal Tenderness to palpation along the right fifth metatarsal, no erythema or swelling apparent, no tenderness to palpation of the left foot or bony landmarks  Assessment and plan:  # Right foot pain Unclear etiology, consider fifth metatarsal fracture however x-ray today is clear Possible gout, treat with NSAIDs  # Gout Previous uric acid is been checked 3 years ago which was 10.4 Possibly this is a gout flare gout related pain. Treating flare with NSAIDs, return in one month to discuss possible ppx Tx  # Hypertension Blood pressure very elevated today, patient states he does not take blood pressure medication due to getting hypotensive in the heat of the summer. He would like to reduce the dose to make sure that he doesn't have low blood pressure. He would like to working out as soon as his foot gets better so that he loses weight and can get  off of all of his medications.  Patient reports home blood pressure is 1:30 to 145 over 90s He is concern for whitecoat hypertension  Orders Placed This Encounter  Procedures  . DG Foot Complete Right    Standing Status:   Future    Standing Expiration Date:   05/19/2017    Order Specific Question:   Reason for Exam (SYMPTOM  OR DIAGNOSIS REQUIRED)    Answer:   R foot pain, possible 5th metatarsal fx    Order Specific Question:   Preferred imaging location?    Answer:   External  . Uric acid  . COMPLETE METABOLIC PANEL WITH GFR    Meds ordered this encounter  Medications  . naproxen (NAPROSYN) 500 MG tablet    Sig: Take 1 tablet (500 mg total) by mouth 2 (two) times daily with a meal.    Dispense:  20 tablet    Refill:  0  . lisinopril (PRINIVIL) 10 MG tablet    Sig: Take 1 tablet (10 mg total) by mouth daily.    Dispense:  30 tablet    Refill:  [place], MD [facility] 03/19/2016, 2:42 PM

## 2016-03-20 ENCOUNTER — Other Ambulatory Visit: Payer: Self-pay | Admitting: *Deleted

## 2016-03-20 LAB — CMP14+EGFR
A/G RATIO: 1.4 (ref 1.2–2.2)
ALBUMIN: 4.3 g/dL (ref 3.5–5.5)
ALK PHOS: 89 IU/L (ref 39–117)
ALT: 38 IU/L (ref 0–44)
AST: 22 IU/L (ref 0–40)
BILIRUBIN TOTAL: 0.5 mg/dL (ref 0.0–1.2)
BUN/Creatinine Ratio: 11 (ref 9–20)
BUN: 10 mg/dL (ref 6–20)
CO2: 23 mmol/L (ref 18–29)
CREATININE: 0.91 mg/dL (ref 0.76–1.27)
Calcium: 9.4 mg/dL (ref 8.7–10.2)
Chloride: 103 mmol/L (ref 96–106)
GFR calc non Af Amer: 107 mL/min/{1.73_m2} (ref >59)
GFR, EST AFRICAN AMERICAN: 123 mL/min/{1.73_m2} (ref >59)
GLOBULIN, TOTAL: 3 g/dL (ref 1.5–4.5)
Glucose: 106 mg/dL — ABNORMAL HIGH (ref 65–99)
Potassium: 4.6 mmol/L (ref 3.5–5.2)
Sodium: 143 mmol/L (ref 134–144)
Total Protein: 7.3 g/dL (ref 6.0–8.5)

## 2016-03-20 LAB — URIC ACID: Uric Acid: 12 mg/dL — ABNORMAL HIGH (ref 3.7–8.6)

## 2016-03-20 MED ORDER — COLCHICINE 0.6 MG PO TABS: ORAL_TABLET | ORAL | 0 refills | Status: DC

## 2016-03-20 NOTE — Telephone Encounter (Signed)
Aware of results and medication sent to pharmacy

## 2016-04-17 ENCOUNTER — Encounter: Payer: Self-pay | Admitting: "Endocrinology

## 2016-04-17 ENCOUNTER — Ambulatory Visit (INDEPENDENT_AMBULATORY_CARE_PROVIDER_SITE_OTHER): Payer: BLUE CROSS/BLUE SHIELD | Admitting: "Endocrinology

## 2016-04-17 VITALS — BP 138/89 | HR 93 | Ht 70.0 in | Wt 302.0 lb

## 2016-04-17 DIAGNOSIS — I10 Essential (primary) hypertension: Secondary | ICD-10-CM

## 2016-04-17 DIAGNOSIS — R7303 Prediabetes: Secondary | ICD-10-CM | POA: Insufficient documentation

## 2016-04-17 DIAGNOSIS — E890 Postprocedural hypothyroidism: Secondary | ICD-10-CM | POA: Diagnosis not present

## 2016-04-17 DIAGNOSIS — C73 Malignant neoplasm of thyroid gland: Secondary | ICD-10-CM | POA: Diagnosis not present

## 2016-04-17 HISTORY — DX: Malignant neoplasm of thyroid gland: C73

## 2016-04-17 NOTE — Progress Notes (Signed)
Subjective:    Patient ID: Tyler [name], male    DOB: 01-16-1976, PCP Eustaquio Maize, MD   Past Medical History:  Diagnosis Date  . Cancer ([place])    thyroid  . Gout   . Gout   . High cholesterol   . Hypertension   . Thyroid disease    thyroid cancer   Past Surgical History:  Procedure Laterality Date  . EYE SURGERY Left    cataract removal  . FRACTURE SURGERY Left 1989   ankle  . THYROIDECTOMY N/A 03/03/2013   Procedure: TOTAL THYROIDECTOMY;  Surgeon: Jamesetta So, MD;  Location: [facility];  Service: General;  Laterality: N/A;   Social History   Social History  . Marital status: Single    Spouse name: N/A  . Number of children: N/A  . Years of education: N/A   Social History Main Topics  . Smoking status: Current Every Day Smoker    Packs/day: 0.50    Years: 10.00    Types: Cigarettes  . Smokeless tobacco: Former Systems developer  . Alcohol use Yes     Comment: occasional  . Drug use: No  . Sexual activity: Yes    Birth control/ protection: None   Other Topics Concern  . None   Social History Narrative  . None   Outpatient Encounter Prescriptions as of 04/17/2016  Medication Sig  . colchicine 0.6 MG tablet TAKE 1 TABLET (0.6 MG TOTAL) BY MOUTH DAILY.  Marland Kitchen levothyroxine (SYNTHROID, LEVOTHROID) 200 MCG tablet Take 1 tablet (200 mcg total) by mouth every morning.  Marland Kitchen lisinopril (PRINIVIL) 10 MG tablet Take 1 tablet (10 mg total) by mouth daily.  . naproxen (NAPROSYN) 500 MG tablet Take 1 tablet (500 mg total) by mouth 2 (two) times daily with a meal.  . [DISCONTINUED] levothyroxine (SYNTHROID, LEVOTHROID) 200 MCG tablet TAKE 1 TABLET BY MOUTH EVERY MORNING   No facility-administered encounter medications on file as of 04/17/2016.    ALLERGIES: No Known Allergies VACCINATION STATUS:  There is no immunization history on file for this patient.  HPI  40 yr old male with medical hx a s above. He underwent total thyroidectomy For thyroid cancer on 03/03/2013 by Dr.  Arnoldo Morale at [facility]. He is s/p thyrogen stimulated I -131 thyroid remnant ablation on 05/05/13 same.  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 no lymph nodes were identified. - he was supposed to be back for 6 months appointment when he was here last in September 2016, however he no showed.  - He showed up for his Thyrogen estimated on body scan in January 2017 which was reported to be unremarkable for tumor recurrence.   He is on LT4 200 mcg po qam. no new complaints.  His  Prior surveilance thyroid/neck ultrasound  on 07/27/2014 was negative for thyroid remnant. - He also has hypertension, prediabetes.   he did comply with BP medication I prescribed for him . he  2 to gain weight,  largely due to dietary indiscretion.  Review of Systems Constitutional: + weight gain, - fatigue, no subjective hyperthermia/ hypothermia.  Eyes: no blurry vision, no xerophthalmia ENT: no sore throat, no nodules palpated in throat, no dysphagia/odynophagia, no hoarseness Cardiovascular: no CP/SOB/palpitations/leg swelling Respiratory: no cough/SOB Gastrointestinal: no N/V/D/C Musculoskeletal: no muscle/joint aches Skin: no rashes Neurological: no tremors/numbness/tingling/dizziness Psychiatric: no depression/anxiety  Objective:    BP 138/89   Pulse 93   Ht 5\' 10"  (1.778  m)   Wt (!) 302 lb (137 kg)   BMI 43.33 kg/m   Wt Readings from Last 3 Encounters:  04/17/16 (!) 302 lb (137 kg)  03/19/16 291 lb 9.6 oz (132.3 kg)  09/24/15 290 lb (131.5 kg)    Physical Exam Constitutional: Obese with BMI of 43.3 ,  in NAD, reluctant affect. Eyes: PERRLA, EOMI, no exophthalmos ENT: moist mucous membranes, post thyroidectomy scar on anterior lower neck ,  no cervical lymphadenopathy Cardiovascular: RRR, No MRG Respiratory: CTA B Gastrointestinal: abdomen soft, NT, ND, BS+ Musculoskeletal: no deformities, strength intact in all 4 Skin: moist,  warm, no rashes Neurological: no tremor with outstretched hands, DTR normal in all 4  CMP ( most recent) CMP     Component Value Date/Time   NA 143 03/19/2016 1453   K 4.6 03/19/2016 1453   CL 103 03/19/2016 1453   CO2 23 03/19/2016 1453   GLUCOSE 106 (H) 03/19/2016 1453   GLUCOSE 135 (H) 03/04/2013 0545   BUN 10 03/19/2016 1453   CREATININE 0.91 03/19/2016 1453   CALCIUM 9.4 03/19/2016 1453   PROT 7.3 03/19/2016 1453   ALBUMIN 4.3 03/19/2016 1453   AST 22 03/19/2016 1453   ALT 38 03/19/2016 1453   ALKPHOS 89 03/19/2016 1453   BILITOT 0.5 03/19/2016 1453   GFRNONAA 107 03/19/2016 1453   GFRAA 123 03/19/2016 1453     Diabetic Labs (most recent): Lab Results  Component Value Date   HGBA1C 6.0 (H) 02/26/2016   HGBA1C 5.9 07/30/2014   HGBA1C 5.8 (H) 12/21/2012    Results for BENCOSME, Mychael M (MRN [id number]) as of 04/17/2016 10:02  Ref. Range 02/26/2016 14:43  TSH Latest Ref Range: 0.40 - 4.50 mIU/L 9.18 (H)  T4,Free(Direct) Latest Ref Range: 0.8 - 1.8 ng/dL 1.3    Assessment & Plan:   1. Postsurgical hypothyroidism  His TFTs are  not on target- showing high TSH of 9.18, free T4 reasonable at 1.3. I suspect inconsistency  or nonadherence to levothyroxine. I urged him to continue levothyroxine 200 g by mouth every morning.   - We discussed about correct intake of levothyroxine, at fasting, with water, separated by at least 30 minutes from breakfast, and separated by more than 4 hours from calcium, iron, multivitamins, acid reflux medications (PPIs). -Patient is made aware of the fact that thyroid hormone replacement is needed for life, dose to be adjusted by periodic monitoring of thyroid function tests. - TSH target for thyroid cancer patients is between 0.1-0.5.  2. Malignant neoplasm of thyroid gland ([place])  -  03/03/2013: s/p near total thyroidectomy for stage 1 multifocal FVPTC. pT1NxMx . 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 - After his surgery, he received  177mCi of I-131 remnant ablation on 05/05/13,  post therapy WBS showing no evidence of distant metastasis.  - surveillance ultrasound of the neck/thyroid in February 2016 was negative for thyroid remnants.  - whole-body scan with Thyrogen was negative for tumor recurrence on 07/05/2015.   - he is urged to maintain a high degree of compliance to f/u and surveillance studies to avoid delay in case he will have tumor recurrence. - I will plan to obtain thyroid/neck ultrasound for surveillance before his next visit.  3. Essential hypertension: - Controlled. He did comply with HTN therapy, I advised him to continue HCTZ-Lisinopril to avoid complications of untreated HTN.  4. Prediabetes -  For prediabetes, he is given detailed DM education, advised to exercise and limit Carbs. - He is extensively counseled for smoking cessation.  5. Severe obesity (BMI >= 40) (HCC) - Caloric restriction for weight loss is advised.   - 25 minutes of time was spent on the care of this patient , 50% of which was applied for counseling on diabetes complications and comprehensive thyroid cancer care plans.   - I advised patient to maintain close follow up with Eustaquio Maize, MD for primary care needs. Follow up plan: Return in about 8 weeks (around 06/12/2016) for Thyroid / Neck Ultrasound, follow up with pre-visit labs.  Glade Lloyd, MD Phone: [phone]  Fax: [phone]   04/17/2016, 11:53 AM

## 2016-04-20 ENCOUNTER — Other Ambulatory Visit: Payer: Self-pay

## 2016-04-20 MED ORDER — LEVOTHYROXINE SODIUM 200 MCG PO TABS: 200.0000 ug | ORAL_TABLET | Freq: Every morning | ORAL | 1 refills | Status: DC

## 2016-04-21 ENCOUNTER — Other Ambulatory Visit: Payer: Self-pay

## 2016-04-21 MED ORDER — COLCHICINE 0.6 MG PO TABS: ORAL_TABLET | ORAL | 0 refills | Status: DC

## 2016-05-01 ENCOUNTER — Ambulatory Visit (INDEPENDENT_AMBULATORY_CARE_PROVIDER_SITE_OTHER): Payer: BLUE CROSS/BLUE SHIELD | Admitting: Pediatrics

## 2016-05-01 ENCOUNTER — Encounter: Payer: Self-pay | Admitting: Pediatrics

## 2016-05-01 VITALS — BP 147/105 | HR 88 | Temp 97.8°F | Ht 70.0 in | Wt 294.0 lb

## 2016-05-01 DIAGNOSIS — I10 Essential (primary) hypertension: Secondary | ICD-10-CM | POA: Diagnosis not present

## 2016-05-01 DIAGNOSIS — M109 Gout, unspecified: Secondary | ICD-10-CM | POA: Diagnosis not present

## 2016-05-01 MED ORDER — ALLOPURINOL 100 MG PO TABS: 100.0000 mg | ORAL_TABLET | Freq: Every day | ORAL | 6 refills | Status: DC

## 2016-05-01 NOTE — Patient Instructions (Addendum)
Gout °Gout is when your joints become red, sore, and swell (inflamed). This is caused by the buildup of uric acid crystals in the joints. Uric acid is a chemical that is normally in the blood. If the level of uric acid gets too high in the blood, these crystals form in your joints and tissues. Over time, these crystals can form into masses near the joints and tissues. These masses can destroy bone and cause the bone to look misshapen (deformed). °HOME CARE  °· Do not take aspirin for pain. °· Only take medicine as told by your doctor. °· Rest the joint as much as you can. When in bed, keep sheets and blankets off painful areas. °· Keep the sore joints raised (elevated). °· Put warm or cold packs on painful joints. Use of warm or cold packs depends on which works best for you. °· Use crutches if the painful joint is in your leg. °· Drink enough fluids to keep your pee (urine) clear or pale yellow. Limit alcohol, sugary drinks, and drinks with fructose in them. °· Follow your diet instructions. Pay careful attention to how much protein you eat. Include fruits, vegetables, whole grains, and fat-free or low-fat milk products in your daily diet. Talk to your doctor or dietitian about the use of coffee, vitamin C, and cherries. These may help lower uric acid levels. °· Keep a healthy body weight. °GET HELP RIGHT AWAY IF:  °· You have watery poop (diarrhea), throw up (vomit), or have any side effects from medicines. °· You do not feel better in 24 hours, or you are getting worse. °· Your joint becomes suddenly more tender, and you have chills or a fever. °MAKE SURE YOU:  °· Understand these instructions. °· Will watch your condition. °· Will get help right away if you are not doing well or get worse. °  °This information is not intended to replace advice given to you by your health care provider. Make sure you discuss any questions you have with your health care provider. °  °Document Released: 03/17/2008 Document Revised:  06/29/2014 Document Reviewed: 01/20/2012 °Elsevier Interactive Patient Education ©2016 [employer] ° °

## 2016-05-01 NOTE — Progress Notes (Signed)
  Subjective:   Patient ID: [name], male    DOB: [birth year], 40 y.o.   MRN: [id number] CC: Follow-up (Gout)  HPI: TYION GHOBRIAL is a 40 y.o. male presenting for Follow-up (Gout)  HTN: tried on lisinopril 20mg  in the past, had dizziness, low BPs Now on 10mg  Takes at home, [id number] systolic BPs lower in summer when dehydrated at work from working in  heat  Gout: last flare about a month ago Went away with colchicine eventually Uric acid elevated to 12 No flares since then  Relevant past medical, surgical, family and social history reviewed. Allergies and medications reviewed and updated. History  Smoking Status  . Current Every Day Smoker  . Packs/day: 0.50  . Years: 10.00  . Types: Cigarettes  Smokeless Tobacco  . Former Systems developer   ROS: Per HPI   Objective:    BP (!) 147/105   Pulse 88   Temp 97.8 F (36.6 C) (Oral)   Ht 5\' 10"  (1.778 m)   Wt 294 lb (133.4 kg)   BMI 42.18 kg/m   Wt Readings from Last 3 Encounters:  05/01/16 294 lb (133.4 kg)  04/17/16 (!) 302 lb (137 kg)  03/19/16 291 lb 9.6 oz (132.3 kg)    Gen: NAD, alert, cooperative with exam, NCAT EYES: EOMI, no conjunctival injection, or no icterus CV: NRRR, normal S1/S2, no murmur, distal pulses 2+ b/l Resp: CTABL, no wheezes, normal WOB Ext: No edema, warm Neuro: Alert and oriented MSK: normal muscle bulk  Assessment & Plan:  Morton was seen today for follow-up.  Diagnoses and all orders for this visit:  Gout, unspecified cause, unspecified chronicity, unspecified site Uric acid level 12 Start below Recheck level in 3 months -     allopurinol (ZYLOPRIM) 100 MG tablet; Take 1 tablet (100 mg total) by mouth daily.  Essential hypertension Elevated here, improved at home Symptoms of hypotension with BP increase last time Cont 10mg  lisinopril, checking at home Let me know if elevated  Follow up plan: Return in about 3 months (around 08/01/2016) for med follow up. Assunta Found, MD [place]

## 2016-06-03 ENCOUNTER — Ambulatory Visit (INDEPENDENT_AMBULATORY_CARE_PROVIDER_SITE_OTHER): Payer: BLUE CROSS/BLUE SHIELD | Admitting: Pediatrics

## 2016-06-03 ENCOUNTER — Encounter: Payer: Self-pay | Admitting: Pediatrics

## 2016-06-03 ENCOUNTER — Ambulatory Visit (INDEPENDENT_AMBULATORY_CARE_PROVIDER_SITE_OTHER): Payer: BLUE CROSS/BLUE SHIELD

## 2016-06-03 VITALS — BP 165/102 | HR 87 | Temp 98.7°F | Ht 70.0 in | Wt 294.6 lb

## 2016-06-03 DIAGNOSIS — M25521 Pain in right elbow: Secondary | ICD-10-CM

## 2016-06-03 DIAGNOSIS — E890 Postprocedural hypothyroidism: Secondary | ICD-10-CM

## 2016-06-03 DIAGNOSIS — I10 Essential (primary) hypertension: Secondary | ICD-10-CM | POA: Diagnosis not present

## 2016-06-03 MED ORDER — LISINOPRIL 20 MG PO TABS: 20.0000 mg | ORAL_TABLET | Freq: Every day | ORAL | Status: DC

## 2016-06-03 MED ORDER — NAPROXEN 500 MG PO TABS: 500.0000 mg | ORAL_TABLET | Freq: Two times a day (BID) | ORAL | 0 refills | Status: DC

## 2016-06-03 NOTE — Progress Notes (Signed)
  Subjective:   Patient ID: Tyler [name], male    DOB: 03-27-1976, 40 y.o.   MRN: [id number] CC: Elbow Pain and Joint Swelling  HPI: Tyler [name] is a 40 y.o. male presenting for Elbow Pain and Joint Swelling  Started two days ago, got worse throughout the day R elbow  Feels different than gout which is usually warm to the touch  HTN: 130-140 at home  Hypothyroidism: has upcoming appt to see endocrine  Relevant past medical, surgical, family and social history reviewed. Allergies and medications reviewed and updated. History  Smoking Status  . Current Every Day Smoker  . Packs/day: 0.50  . Years: 10.00  . Types: Cigarettes  Smokeless Tobacco  . Former Systems developer   ROS: Per HPI   Objective:    BP (!) 165/102   Pulse 87   Temp 98.7 F (37.1 C) (Oral)   Ht 5\' 10"  (1.778 m)   Wt 294 lb 9.6 oz (133.6 kg)   BMI 42.27 kg/m   Wt Readings from Last 3 Encounters:  06/03/16 294 lb 9.6 oz (133.6 kg)  05/01/16 294 lb (133.4 kg)  04/17/16 (!) 302 lb (137 kg)    Gen: NAD, alert, cooperative with exam, NCAT EYES: EOMI, no conjunctival injection, or no icterus CV: NRRR, distal pulses 2+ b/l Resp:  normal WOB Neuro: Alert and oriented MSK: R elbow swollen, about 30 degrees total ROM in R elbow from apprx 110-140 degrees, no point tenderness over R elbow. ROM limited by pain primarily extensor surface, goes down forearm  Assessment & Plan:  Chinedu was seen today for elbow pain and joint swelling.  Diagnoses and all orders for this visit:  Elbow pain, right Feels different than gout attack Feels worse with use Xray with effusion, no fracture NSAIDs, Rest, ice If not improving within next week let me know -     naproxen (NAPROSYN) 500 MG tablet; Take 1 tablet (500 mg total) by mouth 2 (two) times daily with a meal. 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  Postsurgical hypothyroidism Has f/u with endocrine upcoming  Follow up plan: As needed Tyler Found, MD [place]

## 2016-06-05 ENCOUNTER — Other Ambulatory Visit: Payer: Self-pay | Admitting: "Endocrinology

## 2016-06-05 LAB — HEMOGLOBIN A1C
HEMOGLOBIN A1C: 5.6 % (ref <5.7)
MEAN PLASMA GLUCOSE: 114 mg/dL

## 2016-06-05 LAB — T4, FREE: FREE T4: 1.5 ng/dL (ref 0.8–1.8)

## 2016-06-05 LAB — TSH: TSH: 7.21 m[IU]/L — AB (ref 0.40–4.50)

## 2016-06-09 ENCOUNTER — Ambulatory Visit (HOSPITAL_COMMUNITY)
Admission: RE | Admit: 2016-06-09 | Discharge: 2016-06-09 | Disposition: A | Discharge status: Home / Self Care | Payer: BLUE CROSS/BLUE SHIELD | Source: Ambulatory Visit | Attending: "Endocrinology | Admitting: "Endocrinology

## 2016-06-09 DIAGNOSIS — E890 Postprocedural hypothyroidism: Secondary | ICD-10-CM | POA: Diagnosis not present

## 2016-06-09 DIAGNOSIS — C73 Malignant neoplasm of thyroid gland: Secondary | ICD-10-CM | POA: Diagnosis present

## 2016-06-12 ENCOUNTER — Encounter: Payer: Self-pay | Admitting: "Endocrinology

## 2016-06-12 ENCOUNTER — Ambulatory Visit (INDEPENDENT_AMBULATORY_CARE_PROVIDER_SITE_OTHER): Payer: BLUE CROSS/BLUE SHIELD | Admitting: "Endocrinology

## 2016-06-12 VITALS — BP 140/84 | HR 88 | Ht 70.0 in | Wt 295.0 lb

## 2016-06-12 DIAGNOSIS — Z720 Tobacco use: Secondary | ICD-10-CM

## 2016-06-12 DIAGNOSIS — I10 Essential (primary) hypertension: Secondary | ICD-10-CM | POA: Diagnosis not present

## 2016-06-12 DIAGNOSIS — C73 Malignant neoplasm of thyroid gland: Secondary | ICD-10-CM | POA: Diagnosis not present

## 2016-06-12 DIAGNOSIS — R7303 Prediabetes: Secondary | ICD-10-CM

## 2016-06-12 DIAGNOSIS — E890 Postprocedural hypothyroidism: Secondary | ICD-10-CM | POA: Diagnosis not present

## 2016-06-12 MED ORDER — LEVOTHYROXINE SODIUM 25 MCG PO TABS: 25.0000 ug | ORAL_TABLET | Freq: Every day | ORAL | 3 refills | Status: DC

## 2016-06-12 NOTE — Progress Notes (Signed)
Subjective:    Patient ID: Tyler [name], male    DOB: 03/31/1976, PCP Tyler Maize, MD   Past Medical History:  Diagnosis Date  . Cancer ([place])    thyroid  . Gout   . Gout   . High cholesterol   . Hypertension   . Thyroid disease    thyroid cancer   Past Surgical History:  Procedure Laterality Date  . EYE SURGERY Left    cataract removal  . FRACTURE SURGERY Left 1989   ankle  . THYROIDECTOMY N/A 03/03/2013   Procedure: TOTAL THYROIDECTOMY;  Surgeon: Tyler So, MD;  Location: [facility];  Service: General;  Laterality: N/A;   Social History   Social History  . Marital status: Single    Spouse name: N/A  . Number of children: N/A  . Years of education: N/A   Social History Main Topics  . Smoking status: Current Every Day Smoker    Packs/day: 0.50    Years: 10.00    Types: Cigarettes  . Smokeless tobacco: Former Systems developer  . Alcohol use Yes     Comment: occasional  . Drug use: No  . Sexual activity: Yes    Birth control/ protection: None   Other Topics Concern  . None   Social History Narrative  . None   Outpatient Encounter Prescriptions as of 06/12/2016  Medication Sig  . allopurinol (ZYLOPRIM) 100 MG tablet Take 1 tablet (100 mg total) by mouth daily.  . colchicine 0.6 MG tablet TAKE 1 TABLET (0.6 MG TOTAL) BY MOUTH DAILY.  Marland Kitchen levothyroxine (SYNTHROID, LEVOTHROID) 200 MCG tablet Take 1 tablet (200 mcg total) by mouth every morning.  Marland Kitchen levothyroxine (SYNTHROID, LEVOTHROID) 25 MCG tablet Take 1 tablet (25 mcg total) by mouth daily before breakfast.  . lisinopril (PRINIVIL,ZESTRIL) 20 MG tablet Take 1 tablet (20 mg total) by mouth daily.  . naproxen (NAPROSYN) 500 MG tablet Take 1 tablet (500 mg total) by mouth 2 (two) times daily with a meal.   No facility-administered encounter medications on file as of 06/12/2016.    ALLERGIES: No Known Allergies VACCINATION STATUS:  There is no immunization history on file for this patient.  HPI  40 yr old  male with medical hx a s above. He underwent total thyroidectomy For thyroid cancer on 03/03/2013 by Dr. Arnoldo [name] at [facility]. He is s/p thyrogen stimulated I -131 thyroid remnant ablation on 05/05/13 same.  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 no lymph nodes were identified. - he was supposed to be back for 6 months appointment when he was here last in September 2016, however he no showed.  - He showed up for his Thyrogen estimated on body scan in January 2017 which was reported to be unremarkable for tumor recurrence.   He is on LT4 200 mcg po qam. no new complaints.  His  Prior surveilance thyroid/neck ultrasound  on 07/27/2014 was negative for thyroid remnant. - He also has hypertension, prediabetes.   he did comply with BP medication I prescribed for him . he  2 to gain weight,  largely due to dietary indiscretion.  Review of Systems Constitutional: + weight gain, - fatigue, no subjective hyperthermia/ hypothermia.  Eyes: no blurry vision, no xerophthalmia ENT: no sore throat, no nodules palpated in throat, no dysphagia/odynophagia, no hoarseness Cardiovascular: no CP/SOB/palpitations/leg swelling Respiratory: no cough/SOB Gastrointestinal: no N/V/D/C Musculoskeletal: no muscle/joint aches Skin: no rashes Neurological: no tremors/numbness/tingling/dizziness Psychiatric:  no depression/anxiety  Objective:    BP 140/84   Pulse 88   Ht 5\' 10"  (1.778 m)   Wt 295 lb (133.8 kg)   BMI 42.33 kg/m   Wt Readings from Last 3 Encounters:  06/12/16 295 lb (133.8 kg)  06/03/16 294 lb 9.6 oz (133.6 kg)  05/01/16 294 lb (133.4 kg)    Physical Exam Constitutional: Obese with BMI of 43.3 ,  in NAD, reluctant affect. Eyes: PERRLA, EOMI, no exophthalmos ENT: moist mucous membranes, post thyroidectomy scar on anterior lower neck ,  no cervical lymphadenopathy Cardiovascular: RRR, No MRG Respiratory: CTA B Gastrointestinal:  abdomen soft, NT, ND, BS+ Musculoskeletal: no deformities, strength intact in all 4 Skin: moist, warm, no rashes Neurological: no tremor with outstretched hands, DTR normal in all 4  CMP ( most recent) CMP     Component Value Date/Time   NA 143 03/19/2016 1453   K 4.6 03/19/2016 1453   CL 103 03/19/2016 1453   CO2 23 03/19/2016 1453   GLUCOSE 106 (H) 03/19/2016 1453   GLUCOSE 135 (H) 03/04/2013 0545   BUN 10 03/19/2016 1453   CREATININE 0.91 03/19/2016 1453   CALCIUM 9.4 03/19/2016 1453   PROT 7.3 03/19/2016 1453   ALBUMIN 4.3 03/19/2016 1453   AST 22 03/19/2016 1453   ALT 38 03/19/2016 1453   ALKPHOS 89 03/19/2016 1453   BILITOT 0.5 03/19/2016 1453   GFRNONAA 107 03/19/2016 1453   GFRAA 123 03/19/2016 1453     Diabetic Labs (most recent): Lab Results  Component Value Date   HGBA1C 5.6 06/05/2016   HGBA1C 6.0 (H) 02/26/2016   HGBA1C 5.9 07/30/2014   Results for [name], [name] (MRN [id number]) as of 06/12/2016 09:23  Ref. Range 02/26/2016 14:43 06/05/2016 10:17  TSH Latest Ref Range: 0.40 - 4.50 mIU/L 9.18 (H) 7.21 (H)  T4,Free(Direct) Latest Ref Range: 0.8 - 1.8 ng/dL 1.3 1.5     Assessment & Plan:   1. Postsurgical hypothyroidism  His TFTs are  not on target- showing high TSH of  7.21 and free T4 1 0.5.   Tyler [name] he has significant  inconsistency  or nonadherence to levothyroxine. I urged him to continue levothyroxine 200 g by mouth every morning and add another 25 mcg to make it a total of 225 mcg po qam.  - We discussed about correct intake of levothyroxine, at fasting, with water, separated by at least 30 minutes from breakfast, and separated by more than 4 hours from calcium, iron, multivitamins, acid reflux medications (PPIs). -Patient is made aware of the fact that thyroid hormone replacement is needed for life, dose to be adjusted by periodic monitoring of thyroid function tests. - TSH target for thyroid cancer patients is between 0.1-0.5.  2. Malignant  neoplasm of thyroid gland (Tyler [name])  -  03/03/2013: s/p near total thyroidectomy for stage 1 multifocal FVPTC. pT1NxMx . 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 - After his surgery, he received  158mCi of I-131 remnant ablation on 05/05/13,  post therapy WBS showing no evidence of distant metastasis.  - surveillance ultrasound of the neck/thyroid in February 2016 was negative for thyroid remnants.  - whole-body scan with Thyrogen was negative for tumor recurrence on 07/05/2015.   - he is urged to maintain a high degree of compliance to f/u and surveillance studies to avoid delay in case he will have tumor recurrence. -  surveillance  thyroid/neck ultrasound is remarkable for surgically absent thyroid. - He will need at least 1 imaging study per year going forward.   3. Essential hypertension: - Controlled. He did comply with HTN therapy, I advised him to continue HCTZ-Lisinopril to avoid complications of untreated HTN.  4. Prediabetes - For prediabetes, he is given detailed DM education, advised to exercise and limit Carbs. His A1c is 5.6% now. He did not succeed in loss weight. - He is extensively counseled for smoking cessation.  5. Severe obesity (BMI >= 40) ([facility]) - Caloric restriction for weight loss is advised.   - 25 minutes of time was spent on the care of this patient , 50% of which was applied for counseling on diabetes complications and comprehensive thyroid cancer care plans.   - I advised patient to maintain close follow up with Tyler Maize, MD for primary care needs. Follow up plan: Return in about 3 months (around 09/10/2016) for follow up with pre-visit labs.  Glade Lloyd, MD Phone: [phone]  Fax: [phone]   06/12/2016, 9:29 AM

## 2016-06-25 ENCOUNTER — Encounter: Payer: Self-pay | Admitting: Pediatrics

## 2016-06-25 ENCOUNTER — Ambulatory Visit (INDEPENDENT_AMBULATORY_CARE_PROVIDER_SITE_OTHER): Payer: BLUE CROSS/BLUE SHIELD | Admitting: Pediatrics

## 2016-06-25 VITALS — BP 153/105 | HR 101 | Temp 98.0°F | Ht 70.0 in | Wt 290.6 lb

## 2016-06-25 DIAGNOSIS — H65111 Acute and subacute allergic otitis media (mucoid) (sanguinous) (serous), right ear: Secondary | ICD-10-CM | POA: Diagnosis not present

## 2016-06-25 DIAGNOSIS — J069 Acute upper respiratory infection, unspecified: Secondary | ICD-10-CM

## 2016-06-25 MED ORDER — CETIRIZINE HCL 10 MG PO TABS: 10.0000 mg | ORAL_TABLET | Freq: Every day | ORAL | 0 refills | Status: DC

## 2016-06-25 MED ORDER — FLUTICASONE PROPIONATE 50 MCG/ACT NA SUSP: 2.0000 | Freq: Every day | NASAL | 0 refills | Status: DC

## 2016-06-25 MED ORDER — AZITHROMYCIN 250 MG PO TABS: ORAL_TABLET | ORAL | 0 refills | Status: DC

## 2016-06-25 NOTE — Progress Notes (Addendum)
  Subjective:   Patient ID: Tyler [name], male    DOB: September 27, 1975, 41 y.o.   MRN: [id number] CC:(head congestion, drainage, watery eyes, ear pressure x 3 days, denies fever)  HPI: [name] is a 41 y.o. male presenting for  (head congestion, drainage, watery eyes, ear pressure x 3 days, denies fever)  Started as head cold Pressure behind his eyes Taking mucinex, dayquil, nyquil Some coughing/sneezing after blowing nose hard Has missed work x 3 days for symptoms Starting to get red/sore areas around nose from blowing  BPs at home 130s-140s/90s Taking lisinopril 10mg  BID  Relevant past medical, surgical, family and social history reviewed. Allergies and medications reviewed and updated. History  Smoking Status  . Current Every Day Smoker  . Packs/day: 0.50  . Years: 10.00  . Types: Cigarettes  Smokeless Tobacco  . Former User   ROS: Per HPI   Objective:    BP (!) 153/105 (BP Location: Left Arm, Patient Position: Sitting, Cuff Size: Large)   Pulse (!) 101   Temp 98 F (36.7 C) (Oral)   Ht 5\' 10"  (1.778 m)   Wt 290 lb 9.6 oz (131.8 kg)   BMI 41.70 kg/m   Wt Readings from Last 3 Encounters:  06/25/16 290 lb 9.6 oz (131.8 kg)  06/12/16 295 lb (133.8 kg)  06/03/16 294 lb 9.6 oz (133.6 kg)    Gen: NAD, alert, cooperative with exam, NCAT, congested EYES: EOMI, no conjunctival injection, or no icterus ENT:  R TM retracted, pink with effusion present, L TM pearly gray, OP with mild erythema LYMPH: no cervical LAD CV: NRRR, normal S1/S2, no murmur, distal pulses 2+ b/l Resp: CTABL, no wheezes, normal WOB Abd: +BS, soft, NTND. no guarding or organomegaly Ext: No edema, warm Neuro: Alert and oriented MSK: normal muscle bulk Skin: nares b/l slightly erythematous, no open sores  Assessment & Plan:  Liandro was seen today for allergic rhinitis .  Diagnoses and all orders for this visit:  Acute URI Discussed symptomatic care Sinus rinses BID -     fluticasone  (FLONASE) 50 MCG/ACT nasal spray; Place 2 sprays into both nostrils daily. -     cetirizine (ZYRTEC) 10 MG tablet; Take 1 tablet (10 mg total) by mouth daily.  Acute mucoid otitis media of right ear No pain in R ear, given exam, start below if starts to have pain -     azithromycin (ZITHROMAX) 250 MG tablet; Take 2 the first day and then one each day after.  HTN Take lisinopril 20mg  in the morning Cont to follow at home  Follow up plan: Return if symptoms worsen or fail to improve. Assunta Found, MD [place]

## 2016-06-25 NOTE — Patient Instructions (Addendum)
Netipot with distilled water 2-3 times a day to clear out sinuses Or Normal saline nasal spray Flonase steroid nasal spray Antihistamine daily such as cetirizine Ibuprofen 600mg  three times a day as needed Lots of fluids Use Vaseline or Aquaphor for nasal sores

## 2016-07-18 ENCOUNTER — Other Ambulatory Visit: Payer: Self-pay | Admitting: Family Medicine

## 2016-07-27 ENCOUNTER — Telehealth: Payer: Self-pay | Admitting: Pediatrics

## 2016-07-28 ENCOUNTER — Ambulatory Visit (INDEPENDENT_AMBULATORY_CARE_PROVIDER_SITE_OTHER): Payer: BLUE CROSS/BLUE SHIELD | Admitting: Family Medicine

## 2016-07-28 ENCOUNTER — Ambulatory Visit (INDEPENDENT_AMBULATORY_CARE_PROVIDER_SITE_OTHER): Payer: BLUE CROSS/BLUE SHIELD

## 2016-07-28 ENCOUNTER — Encounter: Payer: Self-pay | Admitting: Family Medicine

## 2016-07-28 VITALS — BP 103/75 | HR 106 | Temp 98.2°F | Ht 70.0 in | Wt 286.0 lb

## 2016-07-28 DIAGNOSIS — M79671 Pain in right foot: Secondary | ICD-10-CM

## 2016-07-28 DIAGNOSIS — M2550 Pain in unspecified joint: Secondary | ICD-10-CM

## 2016-07-28 DIAGNOSIS — M1A9XX0 Chronic gout, unspecified, without tophus (tophi): Secondary | ICD-10-CM | POA: Diagnosis not present

## 2016-07-28 DIAGNOSIS — M79672 Pain in left foot: Secondary | ICD-10-CM | POA: Diagnosis not present

## 2016-07-28 DIAGNOSIS — M25521 Pain in right elbow: Secondary | ICD-10-CM | POA: Diagnosis not present

## 2016-07-28 MED ORDER — PREDNISONE 10 MG PO TABS: ORAL_TABLET | ORAL | 0 refills | Status: DC

## 2016-07-28 NOTE — Progress Notes (Signed)
Subjective:  Patient ID: Tyler [name], male    DOB: 1976-01-29  Age: 41 y.o. MRN: [id number]  CC: Joint Pain (pt here today c/o joint pain in multiple joints.)   HPI Tyler [name] presents for Increasing pain in both feet. Primarily at the upper aspect dorsal foot bilaterally. There is puffy edema without erythema. There is a feeling of pressure and fullness. It is rated 9/10. It's been worse by ambulation. Flexing and extending the ankle worsens the pain. Additionally, right elbow has been very painful. Range of motion. Tender at the joint line, radial head. No fever, no sweats. NKI. Ongoing for over 4 mos. No improvement. Has been scheduled with rheumatology. Pt. Treated in the past for Ca thyroid. Taking synthroid. Recent elevated TSH. No recent nml. Level.  History Tyler [name] has a past medical history of Cancer ([place]); Gout; Gout; High cholesterol; Hypertension; and Thyroid disease.   He has a past surgical history that includes Fracture surgery (Left, 1989); Eye surgery (Left); and Thyroidectomy (N/A, 03/03/2013).   His family history includes Hyperlipidemia in his father; Hypertension in his father.He reports that he has been smoking Cigarettes.  He has a 5.00 pack-year smoking history. He has quit using smokeless tobacco. He reports that he drinks alcohol. He reports that he does not use drugs.  Current Outpatient Prescriptions on File Prior to Visit  Medication Sig Dispense Refill  . allopurinol (ZYLOPRIM) 100 MG tablet Take 1 tablet (100 mg total) by mouth daily. 30 tablet 6  . colchicine 0.6 MG tablet TAKE 1 TABLET (0.6 MG TOTAL) BY MOUTH DAILY. 90 tablet 0  . levothyroxine (SYNTHROID, LEVOTHROID) 200 MCG tablet Take 1 tablet (200 mcg total) by mouth every morning. 90 tablet 1  . lisinopril (PRINIVIL,ZESTRIL) 10 MG tablet TAKE 1 TABLET (10 MG TOTAL) BY MOUTH DAILY. 30 tablet 5   No current facility-administered medications on file prior to visit.     ROS Review of Systems    Constitutional: Negative for chills, diaphoresis, fever and unexpected weight change.  HENT: Negative for congestion, hearing loss, rhinorrhea and sore throat.   Eyes: Negative for visual disturbance.  Respiratory: Negative for cough and shortness of breath.   Cardiovascular: Negative for chest pain.  Gastrointestinal: Negative for abdominal pain, constipation and diarrhea.  Genitourinary: Negative for dysuria and flank pain.  Musculoskeletal: Negative for arthralgias and joint swelling.  Skin: Negative for rash.  Neurological: Negative for dizziness and headaches.  Psychiatric/Behavioral: Negative for dysphoric mood and sleep disturbance.    Objective:  BP 103/75   Pulse (!) 106   Temp 98.2 F (36.8 C) (Oral)   Ht 5\' 10"  (1.778 m)   Wt 286 lb (129.7 kg)   BMI 41.04 kg/m   Physical Exam  Constitutional: He is oriented to person, place, and time. He appears well-developed and well-nourished. No distress.  HENT:  Head: Normocephalic and atraumatic.  Right Ear: External ear normal.  Left Ear: External ear normal.  Nose: Nose normal.  Mouth/Throat: Oropharynx is clear and moist.  Eyes: Conjunctivae and EOM are normal. Pupils are equal, round, and reactive to light.  Neck: Normal range of motion. Neck supple. No thyromegaly present.  Cardiovascular: Normal rate, regular rhythm and normal heart sounds.   No murmur heard. Pulmonary/Chest: Effort normal and breath sounds normal. No respiratory distress. He has no wheezes. He has no rales.  Abdominal: Soft. Bowel sounds are normal. He exhibits no distension. There is no tenderness.  Musculoskeletal: He exhibits edema and tenderness.  Lymphadenopathy:    He has no cervical adenopathy.  Neurological: He is alert and oriented to person, place, and time. He has normal reflexes.  Skin: Skin is warm and dry.  Psychiatric: He has a normal mood and affect. His behavior is normal. Judgment and thought content normal.    Assessment &  Plan:   Tyler [name] was seen today for joint pain.  Diagnoses and all orders for this visit:  Chronic gout without tophus, unspecified cause, unspecified site -     Uric acid  Arthralgia, unspecified joint -     Arthritis Panel -     Sedimentation rate -     Uric acid -     DG Foot Complete Left; Future -     DG Foot Complete Right; Future -     DG Elbow 2 Views Right; Future  Other orders -     predniSONE (DELTASONE) 10 MG tablet; Take 5 daily for 3 days followed by 4,3,2 and 1 for 3 days each.   I have discontinued Tyler [name] naproxen, azithromycin, fluticasone, and cetirizine. I am also having him start on predniSONE. Additionally, I am having him maintain his levothyroxine, colchicine, allopurinol, lisinopril, and levothyroxine.  Meds ordered this encounter  Medications  . levothyroxine (SYNTHROID, LEVOTHROID) 25 MCG tablet    Sig: Take 25 mcg by mouth daily before breakfast.  . predniSONE (DELTASONE) 10 MG tablet    Sig: Take 5 daily for 3 days followed by 4,3,2 and 1 for 3 days each.    Dispense:  45 tablet    Refill:  0     Follow-up: Return in about 2 weeks (around 08/11/2016).  Claretta Fraise, M.D.

## 2016-07-29 ENCOUNTER — Other Ambulatory Visit: Payer: Self-pay | Admitting: "Endocrinology

## 2016-07-29 LAB — ARTHRITIS PANEL
BASOS ABS: 0 10*3/uL (ref 0.0–0.2)
Basos: 0 %
EOS (ABSOLUTE): 0.2 10*3/uL (ref 0.0–0.4)
EOS: 2 %
HEMATOCRIT: 42.5 % (ref 37.5–51.0)
Hemoglobin: 14.7 g/dL (ref 13.0–17.7)
Immature Grans (Abs): 0.2 10*3/uL — ABNORMAL HIGH (ref 0.0–0.1)
Immature Granulocytes: 2 %
LYMPHS ABS: 3.3 10*3/uL — AB (ref 0.7–3.1)
Lymphs: 27 %
MCH: 31 pg (ref 26.6–33.0)
MCHC: 34.6 g/dL (ref 31.5–35.7)
MCV: 90 fL (ref 79–97)
MONOS ABS: 0.9 10*3/uL (ref 0.1–0.9)
Monocytes: 7 %
NEUTROS PCT: 62 %
Neutrophils Absolute: 8 10*3/uL — ABNORMAL HIGH (ref 1.4–7.0)
PLATELETS: 244 10*3/uL (ref 150–379)
RBC: 4.74 x10E6/uL (ref 4.14–5.80)
RDW: 13 % (ref 12.3–15.4)
RHEUMATOID FACTOR: 10.7 [IU]/mL (ref 0.0–13.9)
Sed Rate: 46 mm/hr — ABNORMAL HIGH (ref 0–15)
Uric Acid: 9.5 mg/dL — ABNORMAL HIGH (ref 3.7–8.6)
WBC: 12.4 10*3/uL — AB (ref 3.4–10.8)

## 2016-07-29 LAB — T4, FREE: Free T4: 1.4 ng/dL (ref 0.8–1.8)

## 2016-07-29 LAB — TSH: TSH: 1.38 mIU/L (ref 0.40–4.50)

## 2016-07-30 NOTE — Telephone Encounter (Signed)
Can you ask what foot symptoms are? I believe he saw dr Tyler [name] some months ago for foot pain.

## 2016-07-30 NOTE — Telephone Encounter (Signed)
Left message for patient to call back to schedule an appt to be seen so we can do lab results and evaluate patient before making referral to ortho

## 2016-07-30 NOTE — Telephone Encounter (Signed)
Patient states that he has had painful swelling in  Bilateral feet. Was given medication for gout at last visit and medication has not help with pain or swelling.

## 2016-07-30 NOTE — Telephone Encounter (Signed)
Needs to be seen. Not sure if orthopedics is what he needs. May need lab work.

## 2016-07-31 ENCOUNTER — Telehealth: Payer: Self-pay | Admitting: Pediatrics

## 2016-08-07 ENCOUNTER — Ambulatory Visit: Payer: BLUE CROSS/BLUE SHIELD | Admitting: Pediatrics

## 2016-08-11 NOTE — Telephone Encounter (Signed)
Patient was seen for foot pain on 07/28/16

## 2016-08-13 ENCOUNTER — Other Ambulatory Visit: Payer: Self-pay | Admitting: Pediatrics

## 2016-08-13 DIAGNOSIS — J069 Acute upper respiratory infection, unspecified: Secondary | ICD-10-CM

## 2016-08-14 ENCOUNTER — Telehealth: Payer: Self-pay | Admitting: Pediatrics

## 2016-08-14 NOTE — Telephone Encounter (Signed)
Patient is inquiring about his lab results from 07/28/16, arthritis panel.  Please advise.

## 2016-08-14 NOTE — Telephone Encounter (Signed)
It confirms the presence of gout

## 2016-08-14 NOTE — Telephone Encounter (Signed)
Patient is aware of labs.  Reports while he was taking the Prednisone the pain improved but since finishing the medication 3 days ago, the pain has returned.  He is taking the Allopurinol daily to prevent.  Reports taking Colchicine while having a flare does not seem to help at all.  Would like to know what you recommend.

## 2016-08-17 ENCOUNTER — Other Ambulatory Visit: Payer: Self-pay | Admitting: Family Medicine

## 2016-08-17 MED ORDER — PREDNISONE 10 MG PO TABS: ORAL_TABLET | ORAL | 0 refills | Status: DC

## 2016-08-17 NOTE — Telephone Encounter (Signed)
Please contact the patient - I sent in refill of prednisone

## 2016-08-17 NOTE — Telephone Encounter (Signed)
LMOVM that refill has been sent to pharmacy

## 2016-08-18 ENCOUNTER — Other Ambulatory Visit: Payer: Self-pay | Admitting: *Deleted

## 2016-08-18 MED ORDER — CETIRIZINE HCL 10 MG PO TABS: 10.0000 mg | ORAL_TABLET | Freq: Every day | ORAL | 1 refills | Status: DC

## 2016-09-11 ENCOUNTER — Ambulatory Visit (INDEPENDENT_AMBULATORY_CARE_PROVIDER_SITE_OTHER): Payer: BLUE CROSS/BLUE SHIELD | Admitting: Pediatrics

## 2016-09-11 ENCOUNTER — Encounter: Payer: Self-pay | Admitting: Pediatrics

## 2016-09-11 ENCOUNTER — Encounter: Payer: Self-pay | Admitting: "Endocrinology

## 2016-09-11 ENCOUNTER — Ambulatory Visit (INDEPENDENT_AMBULATORY_CARE_PROVIDER_SITE_OTHER): Payer: BLUE CROSS/BLUE SHIELD | Admitting: "Endocrinology

## 2016-09-11 VITALS — BP 130/89 | HR 93 | Temp 98.7°F | Ht 70.0 in | Wt 286.2 lb

## 2016-09-11 VITALS — BP 141/85 | HR 98 | Ht 70.0 in | Wt 285.0 lb

## 2016-09-11 DIAGNOSIS — E890 Postprocedural hypothyroidism: Secondary | ICD-10-CM | POA: Diagnosis not present

## 2016-09-11 DIAGNOSIS — R7303 Prediabetes: Secondary | ICD-10-CM | POA: Diagnosis not present

## 2016-09-11 DIAGNOSIS — R739 Hyperglycemia, unspecified: Secondary | ICD-10-CM | POA: Diagnosis not present

## 2016-09-11 DIAGNOSIS — M109 Gout, unspecified: Secondary | ICD-10-CM

## 2016-09-11 DIAGNOSIS — I10 Essential (primary) hypertension: Secondary | ICD-10-CM

## 2016-09-11 DIAGNOSIS — Z720 Tobacco use: Secondary | ICD-10-CM

## 2016-09-11 DIAGNOSIS — C73 Malignant neoplasm of thyroid gland: Secondary | ICD-10-CM

## 2016-09-11 LAB — BAYER DCA HB A1C WAIVED: HB A1C: 6.3 % (ref <7.0)

## 2016-09-11 MED ORDER — FEBUXOSTAT 40 MG PO TABS: 40.0000 mg | ORAL_TABLET | Freq: Every day | ORAL | 2 refills | Status: DC

## 2016-09-11 MED ORDER — COLCHICINE 0.6 MG PO TABS: ORAL_TABLET | ORAL | 0 refills | Status: DC

## 2016-09-11 MED ORDER — PREDNISONE 10 MG PO TABS: ORAL_TABLET | ORAL | 0 refills | Status: DC

## 2016-09-11 NOTE — Progress Notes (Signed)
  Subjective:   Patient ID: Tyler [name], male    DOB: 1976/01/15, 41 y.o.   MRN: [id number] CC: Gout (Bilateral feet)  HPI: MACCOY HAUBNER is a 41 y.o. male presenting for Gout (Bilateral feet)  Both feet started hurting more again 2-3 days after finishing last prednisone taper about a week ago Affects ability to walk Has been taking allopurinol daily Stopped with flare Continues to have regular flares Has needed multiple courses of prednisone in addition to colchicine for gout control Avoiding exacerbating foods  Relevant past medical, surgical, family and social history reviewed. Allergies and medications reviewed and updated. History  Smoking Status  . Current Every Day Smoker  . Packs/day: 0.50  . Years: 10.00  . Types: Cigarettes  Smokeless Tobacco  . Former User   ROS: Per HPI   Objective:    BP 130/89   Pulse 93   Temp 98.7 F (37.1 C) (Oral)   Ht _0  (1.778 m)   Wt 286 lb 3.2 oz (129.8 kg)   BMI 41.07 kg/m   Wt Readings from Last 3 Encounters:  09/11/16 286 lb 3.2 oz (129.8 kg)  09/11/16 285 lb (129.3 kg)  07/28/16 286 lb (129.7 kg)    Gen: NAD, alert, cooperative with exam, NCAT EYES: EOMI, no conjunctival injection, or no icterus CV: NRRR, normal S1/S2, no murmur, distal pulses 2+ b/l Resp: CTABL, no wheezes, normal WOB Ext: No edema, warm Neuro: Alert and oriented MSK: minimal redness dorsum of feet b/l, slightly ttp b/l  Assessment & Plan:  L[place]mon was seen today for gout.  Diagnoses and all orders for this visit:  Gout, unspecified cause, unspecified chronicity, unspecified site Uric acid remains elevated Refer to rheum Stop allopurinol Start uloric Short pred taper, start with 49m for 3 days, then 20, then 10 -     Ambulatory referral to Rheumatology -     colchicine 0.6 MG tablet; TAKE 1 TABLET (0.6 MG TOTAL) BY MOUTH DAILY. -     Uric Acid -     febuxostat (ULORIC) 40 MG tablet; Take 1 tablet (40 mg total) by mouth daily. -      BMP8+EGFR -     Bayer DCA Hb A1c Waived  Hyperglycemia -     Bayer DCA Hb A1c Waived   Follow up plan: Return in about 4 weeks (around 10/09/2016) for repeat uric acid. CAssunta Found MD [facility]

## 2016-09-11 NOTE — Progress Notes (Signed)
Subjective:    Patient ID: Tyler [name], male    DOB: Jan 21, 1976, PCP Eustaquio Maize, MD   Past Medical History:  Diagnosis Date  . Cancer ([place])    thyroid  . Gout   . Gout   . High cholesterol   . Hypertension   . Thyroid disease    thyroid cancer   Past Surgical History:  Procedure Laterality Date  . EYE SURGERY Left    cataract removal  . FRACTURE SURGERY Left 1989   ankle  . THYROIDECTOMY N/A 03/03/2013   Procedure: TOTAL THYROIDECTOMY;  Surgeon: Jamesetta So, MD;  Location: [facility];  Service: General;  Laterality: N/A;   Social History   Social History  . Marital status: Single    Spouse name: N/A  . Number of children: N/A  . Years of education: N/A   Social History Main Topics  . Smoking status: Current Every Day Smoker    Packs/day: 0.50    Years: 10.00    Types: Cigarettes  . Smokeless tobacco: Former Systems developer  . Alcohol use Yes     Comment: occasional  . Drug use: No  . Sexual activity: Yes    Birth control/ protection: None   Other Topics Concern  . None   Social History Narrative  . None   Outpatient Encounter Prescriptions as of 09/11/2016  Medication Sig  . allopurinol (ZYLOPRIM) 100 MG tablet Take 1 tablet (100 mg total) by mouth daily.  . cetirizine (ZYRTEC) 10 MG tablet Take 1 tablet (10 mg total) by mouth daily.  . colchicine 0.6 MG tablet TAKE 1 TABLET (0.6 MG TOTAL) BY MOUTH DAILY.  Marland Kitchen levothyroxine (SYNTHROID, LEVOTHROID) 200 MCG tablet Take 1 tablet (200 mcg total) by mouth every morning.  Marland Kitchen levothyroxine (SYNTHROID, LEVOTHROID) 25 MCG tablet Take 25 mcg by mouth daily before breakfast.  . lisinopril (PRINIVIL,ZESTRIL) 10 MG tablet TAKE 1 TABLET (10 MG TOTAL) BY MOUTH DAILY.  Marland Kitchen predniSONE (DELTASONE) 10 MG tablet Take 5 daily for 3 days followed by 4,3,2 and 1 for 3 days each.   No facility-administered encounter medications on file as of 09/11/2016.    ALLERGIES: No Known Allergies VACCINATION STATUS:  There is no immunization  history on file for this patient.  HPI  41 yr old male with medical hx a s above. He underwent total thyroidectomy For thyroid cancer on 03/03/2013 by Dr. Arnoldo Morale at [facility]. He is s/p thyrogen stimulated I -131 thyroid remnant ablation on 05/05/13 same.  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 no lymph nodes were identified. - he was supposed to be back for 6 months appointment when he was here last in September 2016, however he no showed.  - He showed up for his Thyrogen estimated on body scan in January 2017 which was reported to be unremarkable for tumor recurrence.   He is on LT4 200 mcg po qam. no new complaints.  His  Prior surveilance thyroid/neck ultrasound  on 07/27/2014 was negative for thyroid remnant. - Another surveillance thyroid/neck ultrasound on 06/09/2016 was negative for thyroid remnant. - He also has hypertension, prediabetes.   he did comply with BP medication I prescribed for him . he  2 to gain weight,  largely due to dietary indiscretion.  Review of Systems Constitutional: + weight gain, - fatigue, no subjective hyperthermia/ hypothermia.  Eyes: no blurry vision, no xerophthalmia ENT: no sore throat, no nodules palpated in throat,  no dysphagia/odynophagia, no hoarseness Cardiovascular: no CP/SOB/palpitations/leg swelling Respiratory: no cough/SOB Gastrointestinal: no N/V/D/C Musculoskeletal: no muscle/joint aches Skin: no rashes Neurological: no tremors/numbness/tingling/dizziness Psychiatric: no depression/anxiety  Objective:    BP (!) 141/85   Pulse 98   Ht 5\' 10"  (1.778 m)   Wt 285 lb (129.3 kg)   BMI 40.89 kg/m   Wt Readings from Last 3 Encounters:  09/11/16 285 lb (129.3 kg)  07/28/16 286 lb (129.7 kg)  06/25/16 290 lb 9.6 oz (131.8 kg)    Physical Exam Constitutional: Obese with BMI of 43.3 ,  in NAD, reluctant affect. Eyes: PERRLA, EOMI, no exophthalmos ENT: moist mucous  membranes, post thyroidectomy scar on anterior lower neck ,  no cervical lymphadenopathy Cardiovascular: RRR, No MRG Respiratory: CTA B Gastrointestinal: abdomen soft, NT, ND, BS+ Musculoskeletal: no deformities, strength intact in all 4 Skin: moist, warm, no rashes Neurological: no tremor with outstretched hands, DTR normal in all 4  CMP ( most recent) CMP     Component Value Date/Time   NA 143 03/19/2016 1453   K 4.6 03/19/2016 1453   CL 103 03/19/2016 1453   CO2 23 03/19/2016 1453   GLUCOSE 106 (H) 03/19/2016 1453   GLUCOSE 135 (H) 03/04/2013 0545   BUN 10 03/19/2016 1453   CREATININE 0.91 03/19/2016 1453   CALCIUM 9.4 03/19/2016 1453   PROT 7.3 03/19/2016 1453   ALBUMIN 4.3 03/19/2016 1453   AST 22 03/19/2016 1453   ALT 38 03/19/2016 1453   ALKPHOS 89 03/19/2016 1453   BILITOT 0.5 03/19/2016 1453   GFRNONAA 107 03/19/2016 1453   GFRAA 123 03/19/2016 1453     Diabetic Labs (most recent): Lab Results  Component Value Date   HGBA1C 5.6 06/05/2016   HGBA1C 6.0 (H) 02/26/2016   HGBA1C 5.9 07/30/2014    Results for SISSEL, Dagon M (MRN [id number]) as of 09/11/2016 09:56  Ref. Range 02/26/2016 14:43 06/05/2016 10:17 07/29/2016 13:27  TSH Latest Ref Range: 0.40 - 4.50 mIU/L 9.18 (H) 7.21 (H) 1.38  T4,Free(Direct) Latest Ref Range: 0.8 - 1.8 ng/dL 1.3 1.5 1.4    Assessment & Plan:   1. Postsurgical hypothyroidism  -His thyroid function tests are consistent with appropriate replacement.  - He has been more consistent taking his levothyroxine since last time.  I urged him to continue levothyroxine 200 g by mouth every morning and add another 25 mcg to make it a total of 225 mcg po qam.  - We discussed about correct intake of levothyroxine, at fasting, with water, separated by at least 30 minutes from breakfast, and separated by more than 4 hours from calcium, iron, multivitamins, acid reflux medications (PPIs). -Patient is made aware of the fact that thyroid hormone  replacement is needed for life, dose to be adjusted by periodic monitoring of thyroid function tests. - TSH target for thyroid cancer patients is between 0.1-0.5.  2. Malignant neoplasm of thyroid gland ([place])  -  03/03/2013: s/p near total thyroidectomy for stage 1 multifocal FVPTC. pT1NxMx . 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 - After his surgery, he received  173mCi of I-131 remnant ablation on 05/05/13,  post therapy WBS showing no evidence of distant metastasis.  - surveillance ultrasound of the neck/thyroid in February 2016 was negative for thyroid remnants.  - whole-body scan with Thyrogen was negative for tumor recurrence on 07/05/2015.   - he is urged  to maintain a high degree of compliance to f/u and surveillance studies to avoid delay in case he will have tumor recurrence. - another surveillance  thyroid/neck ultrasound on 06/09/2016 is remarkable for surgically absent thyroid. - He will need at least 1 imaging study per year going forward.   3. Essential hypertension: - Controlled. He did comply with HTN therapy, I advised him to continue HCTZ-Lisinopril to avoid complications of untreated HTN.  4. Prediabetes - For prediabetes, he is given detailed DM education, advised to exercise and limit Carbs. His A1c is 5.6% now. - He has lost 10 pounds since last visit. - He is extensively counseled for smoking cessation.  5. Severe obesity (BMI >= 40) ([facility]) - Caloric restriction for weight loss is advised.   - 25 minutes of time was spent on the care of this patient , 50% of which was applied for counseling on diabetes complications and comprehensive thyroid cancer care plans.   - I advised patient to maintain close follow up with Eustaquio Maize, MD for primary care needs. Follow up plan: Return in about 6 months (around  03/14/2017) for follow up with pre-visit labs.  Glade Lloyd, MD Phone: [phone]  Fax: [phone]   09/11/2016, 10:10 AM

## 2016-09-11 NOTE — Patient Instructions (Signed)

## 2016-09-12 LAB — BMP8+EGFR
BUN / CREAT RATIO: 8 — AB (ref 9–20)
BUN: 8 mg/dL (ref 6–20)
CO2: 26 mmol/L (ref 18–29)
CREATININE: 1.02 mg/dL (ref 0.76–1.27)
Calcium: 9.4 mg/dL (ref 8.7–10.2)
Chloride: 99 mmol/L (ref 96–106)
GFR calc Af Amer: 107 mL/min/{1.73_m2} (ref >59)
GFR calc non Af Amer: 93 mL/min/{1.73_m2} (ref >59)
GLUCOSE: 95 mg/dL (ref 65–99)
Potassium: 4.5 mmol/L (ref 3.5–5.2)
Sodium: 140 mmol/L (ref 134–144)

## 2016-09-12 LAB — URIC ACID: Uric Acid: 11.8 mg/dL — ABNORMAL HIGH (ref 3.7–8.6)

## 2016-09-21 ENCOUNTER — Other Ambulatory Visit: Payer: Self-pay | Admitting: *Deleted

## 2016-09-21 MED ORDER — LISINOPRIL 10 MG PO TABS: 10.0000 mg | ORAL_TABLET | Freq: Every day | ORAL | 0 refills | Status: DC

## 2016-10-12 ENCOUNTER — Ambulatory Visit: Payer: BLUE CROSS/BLUE SHIELD | Admitting: Pediatrics

## 2016-10-14 ENCOUNTER — Other Ambulatory Visit: Payer: Self-pay | Admitting: "Endocrinology

## 2016-10-23 ENCOUNTER — Other Ambulatory Visit: Payer: Self-pay | Admitting: Pediatrics

## 2016-10-23 ENCOUNTER — Ambulatory Visit (INDEPENDENT_AMBULATORY_CARE_PROVIDER_SITE_OTHER): Payer: BLUE CROSS/BLUE SHIELD | Admitting: Pediatrics

## 2016-10-23 ENCOUNTER — Encounter: Payer: Self-pay | Admitting: Pediatrics

## 2016-10-23 VITALS — BP 108/67 | HR 87 | Temp 97.8°F | Ht 70.0 in | Wt 278.4 lb

## 2016-10-23 DIAGNOSIS — M109 Gout, unspecified: Secondary | ICD-10-CM | POA: Diagnosis not present

## 2016-10-23 DIAGNOSIS — Z6839 Body mass index (BMI) 39.0-39.9, adult: Secondary | ICD-10-CM | POA: Diagnosis not present

## 2016-10-23 DIAGNOSIS — Z720 Tobacco use: Secondary | ICD-10-CM | POA: Diagnosis not present

## 2016-10-23 DIAGNOSIS — I10 Essential (primary) hypertension: Secondary | ICD-10-CM

## 2016-10-23 MED ORDER — BUPROPION HCL ER (SR) 150 MG PO TB12: 150.0000 mg | ORAL_TABLET | Freq: Two times a day (BID) | ORAL | 5 refills | Status: DC

## 2016-10-23 NOTE — Progress Notes (Signed)
  Subjective:   Patient ID: Tyler [name], male    DOB: 1975-09-23, 41 y.o.   MRN: [id number] CC: Follow-up (4 week) multiple med problems HPI: Tyler [name] is a 41 y.o. 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 Allergies and medications reviewed and updated. History  Smoking Status  . Current Every Day Smoker  . Packs/day: 0.50  . Years: 10.00  . Types: Cigarettes  Smokeless Tobacco  . Former User   ROS: Per HPI   Objective:    BP 108/67   Pulse 87   Temp 97.8 F (36.6 C) (Oral)   Ht 5\' 10"  (1.778 m)   Wt 278 lb 6.4 oz (126.3 kg)   BMI 39.95 kg/m   Wt Readings from Last 3 Encounters:  10/23/16 278 lb 6.4 oz (126.3 kg)  09/11/16 286 lb 3.2 oz (129.8 kg)  09/11/16 285 lb (129.3 kg)    Gen: NAD, alert, cooperative with exam, NCAT EYES: EOMI, no conjunctival injection, or no icterus CV: NRRR, normal S1/S2, no murmur Resp: CTABL, no wheezes, normal WOB Abd: +BS, soft, NTND. no guarding or organomegaly Ext: No edema, warm Neuro: Alert and oriented MSK: nl ROM wrists, elbows, no swelling Psych: normal affect, mood has been fine  Assessment & Plan:  Tyler [name] was seen today for follow-up med problems.  Diagnoses and all orders for this visit:  Essential hypertension BP normal today, not on meds Stop lisinopril Cont to check at home Likely will need to restart when weather cools as BP tends to increase then  Tobacco  use Discussed cessation strategies Open to trying below -     buPROPion (WELLBUTRIN SR) 150 MG 12 hr tablet; Take 1 tablet (150 mg total) by mouth 2 (two) times daily.  Gout, unspecified cause, unspecified chronicity, unspecified site Improving symptoms on uloric, has f/u with rheum  BMI 39.0-39.9,adult Weight down, discussed lifestyle changes, avoiding sugary drinks, increasing physical activity  Follow up plan: Return in about 6 months (around 04/25/2017). Assunta Found, MD [place]

## 2016-10-23 NOTE — Patient Instructions (Signed)
Wellbutrin once a day for three days then twice a day

## 2016-11-04 ENCOUNTER — Ambulatory Visit: Payer: BLUE CROSS/BLUE SHIELD | Admitting: Rheumatology

## 2016-11-13 ENCOUNTER — Other Ambulatory Visit: Payer: Self-pay | Admitting: *Deleted

## 2016-11-13 DIAGNOSIS — M109 Gout, unspecified: Secondary | ICD-10-CM

## 2016-11-13 MED ORDER — FEBUXOSTAT 40 MG PO TABS: 40.0000 mg | ORAL_TABLET | Freq: Every day | ORAL | 2 refills | Status: DC

## 2016-12-04 ENCOUNTER — Ambulatory Visit: Payer: BLUE CROSS/BLUE SHIELD | Admitting: Rheumatology

## 2016-12-08 ENCOUNTER — Ambulatory Visit (INDEPENDENT_AMBULATORY_CARE_PROVIDER_SITE_OTHER): Payer: BLUE CROSS/BLUE SHIELD | Admitting: Physician Assistant

## 2016-12-08 ENCOUNTER — Encounter: Payer: Self-pay | Admitting: Physician Assistant

## 2016-12-08 VITALS — BP 132/112 | HR 99 | Temp 97.6°F | Ht 70.0 in | Wt 256.6 lb

## 2016-12-08 DIAGNOSIS — R112 Nausea with vomiting, unspecified: Secondary | ICD-10-CM

## 2016-12-08 MED ORDER — PROMETHAZINE HCL 25 MG PO TABS: 25.0000 mg | ORAL_TABLET | Freq: Three times a day (TID) | ORAL | 0 refills | Status: DC | PRN

## 2016-12-08 NOTE — Progress Notes (Signed)
Subjective:     Patient ID: Tyler [name], male   DOB: 02-05-1976, 41 y.o.   MRN: [id number]  HPI Pt with several day hx of intermit N/V and diarrhea No OTC meds fr sx he has been trying to push fluids Last episode of N/V/D last pm Pt with outdoor job  Review of Systems  Constitutional: Positive for activity change, appetite change and fatigue. Negative for chills, diaphoresis and fever.  HENT: Negative.   Respiratory: Negative.   Cardiovascular: Negative.   Gastrointestinal: Positive for diarrhea, nausea and vomiting. Negative for abdominal distention, abdominal pain and constipation.       Objective:   Physical Exam  Constitutional: He appears well-developed and well-nourished.  HENT:  Mouth/Throat: Oropharynx is clear and moist. No oropharyngeal exudate.  Neck: Neck supple.  Cardiovascular: Normal rate, regular rhythm and normal heart sounds.   Pulmonary/Chest: Effort normal and breath sounds normal.  Abdominal: Soft. He exhibits no distension and no mass. There is no tenderness. There is no rebound.  Lymphadenopathy:    He has no cervical adenopathy.  Nursing note and vitals reviewed.      Assessment:     1. Nausea and vomiting, intractability of vomiting not specified, unspecified vomiting type        Plan:     Fluids caff/dairy free Phenergan rx- SE reviewed Progress diet slowly OTC Imodium for sx Work note F/U prn

## 2016-12-08 NOTE — Patient Instructions (Signed)
Gastritis, Adult  Gastritis is swelling (inflammation) of the stomach. When you have this condition, you can have these problems (symptoms):  ? Pain in your stomach.  ? A burning feeling in your stomach.  ? Feeling sick to your stomach (nauseous).  ? Throwing up (vomiting).  ? Feeling too full after you eat.  It is important to get help for this condition. Without help, your stomach can bleed, and you can get sores (ulcers) in your stomach.  Follow these instructions at home:  ? Take over-the-counter and prescription medicines only as told by your doctor.  ? If you were prescribed an antibiotic medicine, take it as told by your doctor. Do not stop taking it even if you start to feel better.  ? Drink enough fluid to keep your pee (urine) clear or pale yellow.  ? Instead of eating big meals, eat small meals often.  Contact a health care provider if:  ? Your problems get worse.  ? Your problems go away and then come back.  Get help right away if:  ? You throw up blood or something that looks like coffee grounds.  ? You have black or dark red poop (stools).  ? You cannot keep fluids down.  ? Your stomach pain gets worse.  ? You have a fever.  ? You do not feel better after 1 week.  This information is not intended to replace advice given to you by your health care provider. Make sure you discuss any questions you have with your health care provider.  Document Released: 11/25/2007 Document Revised: 02/05/2016 Document Reviewed: 03/02/2015  Elsevier Interactive Patient Education ? 2018 [employer]

## 2017-01-22 ENCOUNTER — Ambulatory Visit (INDEPENDENT_AMBULATORY_CARE_PROVIDER_SITE_OTHER): Payer: BLUE CROSS/BLUE SHIELD | Admitting: Pediatrics

## 2017-01-22 ENCOUNTER — Encounter: Payer: Self-pay | Admitting: Pediatrics

## 2017-01-22 VITALS — BP 135/88 | HR 78 | Temp 97.7°F | Ht 70.0 in | Wt 271.0 lb

## 2017-01-22 DIAGNOSIS — M25531 Pain in right wrist: Secondary | ICD-10-CM | POA: Diagnosis not present

## 2017-01-22 MED ORDER — NAPROXEN 500 MG PO TABS: 500.0000 mg | ORAL_TABLET | Freq: Two times a day (BID) | ORAL | 1 refills | Status: DC

## 2017-01-22 MED ORDER — PREDNISONE 20 MG PO TABS: ORAL_TABLET | ORAL | 0 refills | Status: DC

## 2017-01-22 NOTE — Progress Notes (Signed)
  Subjective:   Patient ID: Tyler [name], male    DOB: 03-31-1976, 41 y.o.   MRN: [id number] CC: Wrist Pain (Right )  HPI: Tyler [name] is a 41 y.o. male presenting for Wrist Pain (Right )  Started earlier this week Has h/o wrist injury, had fracture that was never set Every once in a while has episodic pain in R wrist after he uses it too much Last time was a year ago No injury Was recommended to get wrist fusion years ago, bc would limit wrist mobility he has been putting off as long as he can Has needed NSAIDs/prednisone in past to help inflammation per pt  Relevant past medical, surgical, family and social history reviewed. Allergies and medications reviewed and updated. History  Smoking Status  . Former Smoker  . Packs/day: 0.50  . Years: 10.00  . Types: Cigarettes  . Quit date: 11/27/2016  Smokeless Tobacco  . Former Systems developer   ROS: Per HPI   Objective:    BP 135/88   Pulse 78   Temp 97.7 F (36.5 C) (Oral)   Ht 5\' 10"  (1.778 m)   Wt 271 lb (122.9 kg)   BMI 38.88 kg/m   Wt Readings from Last 3 Encounters:  01/22/17 271 lb (122.9 kg)  12/08/16 256 lb 9.6 oz (116.4 kg)  10/23/16 278 lb 6.4 oz (126.3 kg)    Gen: NAD, alert, cooperative with exam, NCAT EYES: EOMI, no conjunctival injection, or no icterus CV: distal pulses 2+ b/l Resp:  normal WOB Neuro: Alert and oriented MSK: slightly swollen R wrist compared with L, no point tenderness, pain with flexion and extension, decreased ROM compared with L  Assessment & Plan:  Tyler [name] was seen today for wrist pain.  Diagnoses and all orders for this visit:  Right wrist pain Start NSAIDs, ice, rest, use wrist splint, avoid activities that make pain worse If not improving can try prednisone, has helped in the past, will cause BGLs to go up -     naproxen (NAPROSYN) 500 MG tablet; Take 1 tablet (500 mg total) by mouth 2 (two) times daily with a meal. -     predniSONE (DELTASONE) 20 MG tablet; 2 po at same time daily for  3 days   Follow up plan: As scheduled Assunta Found, MD [place]

## 2017-02-12 ENCOUNTER — Other Ambulatory Visit: Payer: Self-pay | Admitting: Pediatrics

## 2017-02-12 DIAGNOSIS — M109 Gout, unspecified: Secondary | ICD-10-CM

## 2017-02-28 ENCOUNTER — Other Ambulatory Visit: Payer: Self-pay | Admitting: "Endocrinology

## 2017-03-01 ENCOUNTER — Telehealth: Payer: Self-pay | Admitting: "Endocrinology

## 2017-03-01 NOTE — Telephone Encounter (Signed)
RX sent

## 2017-03-01 NOTE — Telephone Encounter (Signed)
Tyler [name] called stating that he needs a refill on levothyroxine (SYNTHROID, LEVOTHROID) 200 MCG tablet please advise?

## 2017-03-03 LAB — COMPLETE METABOLIC PANEL WITH GFR
AG Ratio: 1.4 (calc) (ref 1.0–2.5)
ALBUMIN MSPROF: 3.7 g/dL (ref 3.6–5.1)
ALKALINE PHOSPHATASE (APISO): 78 U/L (ref 40–115)
ALT: 23 U/L (ref 9–46)
AST: 15 U/L (ref 10–40)
BILIRUBIN TOTAL: 0.3 mg/dL (ref 0.2–1.2)
BUN: 8 mg/dL (ref 7–25)
CHLORIDE: 108 mmol/L (ref 98–110)
CO2: 27 mmol/L (ref 20–32)
Calcium: 9.3 mg/dL (ref 8.6–10.3)
Creat: 0.87 mg/dL (ref 0.60–1.35)
GFR, EST AFRICAN AMERICAN: 127 mL/min/{1.73_m2} (ref >=60)
GFR, Est Non African American: 109 mL/min/{1.73_m2} (ref >=60)
GLOBULIN: 2.7 g/dL (ref 1.9–3.7)
GLUCOSE: 107 mg/dL — AB (ref 65–99)
POTASSIUM: 4.6 mmol/L (ref 3.5–5.3)
SODIUM: 140 mmol/L (ref 135–146)
TOTAL PROTEIN: 6.4 g/dL (ref 6.1–8.1)

## 2017-03-03 LAB — HEMOGLOBIN A1C
EAG (MMOL/L): 6.5 (calc)
Hgb A1c MFr Bld: 5.7 % of total Hgb — ABNORMAL HIGH (ref <5.7)
Mean Plasma Glucose: 117 (calc)

## 2017-03-03 LAB — T4, FREE: FREE T4: 1.4 ng/dL (ref 0.8–1.8)

## 2017-03-03 LAB — TSH: TSH: 1.82 mIU/L (ref 0.40–4.50)

## 2017-03-19 ENCOUNTER — Ambulatory Visit (INDEPENDENT_AMBULATORY_CARE_PROVIDER_SITE_OTHER): Payer: BLUE CROSS/BLUE SHIELD | Admitting: "Endocrinology

## 2017-03-19 ENCOUNTER — Encounter: Payer: Self-pay | Admitting: "Endocrinology

## 2017-03-19 VITALS — BP 138/80 | HR 64 | Ht 70.0 in | Wt 263.0 lb

## 2017-03-19 DIAGNOSIS — Z6837 Body mass index (BMI) 37.0-37.9, adult: Secondary | ICD-10-CM

## 2017-03-19 DIAGNOSIS — E890 Postprocedural hypothyroidism: Secondary | ICD-10-CM | POA: Diagnosis not present

## 2017-03-19 DIAGNOSIS — R7303 Prediabetes: Secondary | ICD-10-CM | POA: Diagnosis not present

## 2017-03-19 DIAGNOSIS — C73 Malignant neoplasm of thyroid gland: Secondary | ICD-10-CM | POA: Diagnosis not present

## 2017-03-19 MED ORDER — LEVOTHYROXINE SODIUM 200 MCG PO TABS: 200.0000 ug | ORAL_TABLET | Freq: Every morning | ORAL | 1 refills | Status: DC

## 2017-03-19 MED ORDER — LEVOTHYROXINE SODIUM 25 MCG PO TABS: 25.0000 ug | ORAL_TABLET | Freq: Every day | ORAL | 1 refills | Status: DC

## 2017-03-19 NOTE — Progress Notes (Signed)
Subjective:    Patient ID: Tyler [name], male    DOB: 04-27-1976, PCP Eustaquio Maize, MD   Past Medical History:  Diagnosis Date  . Cancer ([place])    thyroid  . Gout   . Gout   . High cholesterol   . Hypertension   . Thyroid disease    thyroid cancer   Past Surgical History:  Procedure Laterality Date  . EYE SURGERY Left    cataract removal  . FRACTURE SURGERY Left 1989   ankle  . THYROIDECTOMY N/A 03/03/2013   Procedure: TOTAL THYROIDECTOMY;  Surgeon: Jamesetta So, MD;  Location: [facility];  Service: General;  Laterality: N/A;   Social History   Social History  . Marital status: Single    Spouse name: N/A  . Number of children: N/A  . Years of education: N/A   Social History Main Topics  . Smoking status: Former Smoker    Packs/day: 0.50    Years: 10.00    Types: Cigarettes    Quit date: 11/27/2016  . Smokeless tobacco: Former Systems developer  . Alcohol use Yes     Comment: occasional  . Drug use: No  . Sexual activity: Yes    Birth control/ protection: None   Other Topics Concern  . None   Social History Narrative  . None   Outpatient Encounter Prescriptions as of 03/19/2017  Medication Sig  . cetirizine (ZYRTEC) 10 MG tablet Take 1 tablet (10 mg total) by mouth daily.  . colchicine 0.6 MG tablet TAKE 1 TABLET (0.6 MG TOTAL) BY MOUTH DAILY.  . febuxostat (ULORIC) 40 MG tablet Take 1 tablet (40 mg total) by mouth daily.  Marland Kitchen levothyroxine (SYNTHROID, LEVOTHROID) 200 MCG tablet Take 1 tablet (200 mcg total) by mouth every morning.  Marland Kitchen levothyroxine (SYNTHROID, LEVOTHROID) 25 MCG tablet Take 1 tablet (25 mcg total) by mouth daily before breakfast.  . naproxen (NAPROSYN) 500 MG tablet Take 1 tablet (500 mg total) by mouth 2 (two) times daily with a meal.  . promethazine (PHENERGAN) 25 MG tablet Take 1 tablet (25 mg total) by mouth every 8 (eight) hours as needed for nausea or vomiting.  . [DISCONTINUED] levothyroxine (SYNTHROID, LEVOTHROID) 200 MCG tablet TAKE 1 TABLET  (200 MCG TOTAL) BY MOUTH EVERY MORNING.  . [DISCONTINUED] levothyroxine (SYNTHROID, LEVOTHROID) 25 MCG tablet Take 25 mcg by mouth daily before breakfast.  . [DISCONTINUED] predniSONE (DELTASONE) 20 MG tablet 2 po at same time daily for 3 days   No facility-administered encounter medications on file as of 03/19/2017.    ALLERGIES: No Known Allergies VACCINATION STATUS:  There is no immunization history on file for this patient.  HPI  41 yr old male with medical hx a s above. He underwent total thyroidectomy for thyroid cancer on 03/03/2013 by Dr. Arnoldo Morale at [facility]. He is s/p thyrogen stimulated I -131 thyroid remnant ablation on 05/05/13 same.  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 no lymph nodes were identified.  -Subsequent Thyrogen Stimulated  whole body scan in January 2017  was reported to be unremarkable for tumor recurrence/distant metastasis.   He is on  levothyroxine 225 mcg po qam. reports compliance,  no new complaints.  -  thyroid/neck ultrasound  on 07/27/2014 was negative for thyroid remnant. - Another surveillance thyroid/neck ultrasound on 06/09/2016 was negative for thyroid remnant. - He also has hypertension, prediabetes.   he did comply with BP medication I prescribed  for him. He is lost significant amount of weight over the last year, largely due to dietary modifications.   Review of Systems Constitutional: + weight loss, - fatigue, no subjective hyperthermia/ hypothermia.  Eyes: no blurry vision, no xerophthalmia ENT: no sore throat, no nodules palpated in throat, no dysphagia/odynophagia, no hoarseness Cardiovascular: no CP/SOB/palpitations/leg swelling Respiratory: no cough/SOB Gastrointestinal: no N/V/D/C Musculoskeletal: no muscle/joint aches Skin: no rashes Neurological: no tremors/numbness/tingling/dizziness Psychiatric: no depression/anxiety  Objective:    BP 138/80   Pulse 64    Ht 5\' 10"  (1.778 m)   Wt 263 lb (119.3 kg)   BMI 37.74 kg/m   Wt Readings from Last 3 Encounters:  03/19/17 263 lb (119.3 kg)  01/22/17 271 lb (122.9 kg)  12/08/16 256 lb 9.6 oz (116.4 kg)    Physical Exam Constitutional: Obese with BMI of 37.7  improving from 43.3 ,  in NAD, reluctant affect. Eyes: PERRLA, EOMI, no exophthalmos ENT: moist mucous membranes, post thyroidectomy scar on anterior lower neck ,  no cervical lymphadenopathy Cardiovascular: RRR, No MRG Respiratory: CTA B Gastrointestinal: abdomen soft, NT, ND, BS+ Musculoskeletal: no deformities, strength intact in all 4 Skin: moist, warm, no rashes Neurological: no tremor with outstretched hands, DTR normal in all 4  CMP ( most recent) CMP     Component Value Date/Time   NA 140 03/02/2017 0856   NA 140 09/11/2016 1224   K 4.6 03/02/2017 0856   CL 108 03/02/2017 0856   CO2 27 03/02/2017 0856   GLUCOSE 107 (H) 03/02/2017 0856   BUN 8 03/02/2017 0856   BUN 8 09/11/2016 1224   CREATININE 0.87 03/02/2017 0856   CALCIUM 9.3 03/02/2017 0856   PROT 6.4 03/02/2017 0856   PROT 7.3 03/19/2016 1453   ALBUMIN 4.3 03/19/2016 1453   AST 15 03/02/2017 0856   ALT 23 03/02/2017 0856   ALKPHOS 89 03/19/2016 1453   BILITOT 0.3 03/02/2017 0856   BILITOT 0.5 03/19/2016 1453   GFRNONAA 109 03/02/2017 0856   GFRAA 127 03/02/2017 0856     Diabetic Labs (most recent): Lab Results  Component Value Date   HGBA1C 5.7 (H) 03/02/2017   HGBA1C 5.6 06/05/2016   HGBA1C 6.0 (H) 02/26/2016   Results for PIEPER, Caetano M (MRN [id number]) as of 03/19/2017 09:47  Ref. Range 03/02/2017 08:56  Glucose Latest Ref Range: 65 - 99 mg/dL 107 (H)  Hemoglobin A1C Latest Ref Range: <5.7 % of total Hgb 5.7 (H)  TSH Latest Ref Range: 0.40 - 4.50 mIU/L 1.82  T4,Free(Direct) Latest Ref Range: 0.8 - 1.8 ng/dL 1.4     Assessment & Plan:   1. Postsurgical hypothyroidism  -His thyroid function tests are consistent with appropriate replacement.  -  He has been more consistent taking his levothyroxine since last time.  I urged him to continue levothyroxine 225 g by mouth every morning .  - We discussed about correct intake of levothyroxine, at fasting, with water, separated by at least 30 minutes from breakfast, and separated by more than 4 hours from calcium, iron, multivitamins, acid reflux medications (PPIs). -Patient is made aware of the fact that thyroid hormone replacement is needed for life, dose to be adjusted by periodic monitoring of thyroid function tests. - TSH target for thyroid cancer patients is between 0.1-0.5.  2. Malignant neoplasm of thyroid gland ([place])  -  03/03/2013:  Status post  near total thyroidectomy for stage 1 multifocal FVPTC. pT1NxMx . 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 - After his surgery, he received  17mCi of I-131 remnant ablation on 05/05/13,  post therapy whole body scar showing no evidence of distant metastasis.  - surveillance ultrasound of the neck/thyroid in February 2016 was negative for thyroid remnants.  - whole-body scan with Thyrogen was negative for tumor recurrence on 07/05/2015.  - another surveillance  thyroid/neck ultrasound on 06/09/2016 is remarkable for surgically absent thyroid. - He will need Thyrogen stimulated whole body scan before his next visit in 6 month.  3. Essential hypertension: - Controlled. He stopped his medication. He has lost significant amount of weight which has helped. Was previously treated with HCTZ/lisinopril.   4. Prediabetes - For prediabetes, he is given detailed DM education, advised to exercise and limit Carbs. His A1c is 5.7% now. - He has lost 8 more  pounds since last visit, total of 40 pounds over the last year. He would not require medications at this time. - He is extensively counseled for  smoking cessation.  5. obesity was BMI 37.7 - Caloric restriction for weight loss is advised.  - Time spent with the patient: 25 min, of which >50% was spent in reviewing his  current and  Previous treatments for thyroid cancer, current and previous imaging studies, long-term plan for postsurgical hypothyroidism, prediabetes, hypertension.  - I advised patient to maintain close follow up with Eustaquio Maize, MD for primary care needs. Follow up plan: Return in about 6 months (around 09/16/2017) for follow up with pre-visit labs, follow up with Whole Body Scan w/Thyrogen.  Glade Lloyd, MD Phone: [phone]  Fax: [phone]  This note was partially dictated with voice recognition software. Similar sounding words can be transcribed inadequately or may not  be corrected upon review.  03/19/2017, 10:03 AM

## 2017-04-30 ENCOUNTER — Encounter: Payer: Self-pay | Admitting: Pediatrics

## 2017-04-30 ENCOUNTER — Ambulatory Visit: Payer: BLUE CROSS/BLUE SHIELD | Admitting: Pediatrics

## 2017-04-30 VITALS — BP 139/89 | HR 90 | Temp 97.8°F | Ht 70.0 in | Wt 276.6 lb

## 2017-04-30 DIAGNOSIS — E669 Obesity, unspecified: Secondary | ICD-10-CM

## 2017-04-30 DIAGNOSIS — I10 Essential (primary) hypertension: Secondary | ICD-10-CM | POA: Diagnosis not present

## 2017-04-30 DIAGNOSIS — M109 Gout, unspecified: Secondary | ICD-10-CM

## 2017-04-30 DIAGNOSIS — M25531 Pain in right wrist: Secondary | ICD-10-CM

## 2017-04-30 MED ORDER — FEBUXOSTAT 40 MG PO TABS: 40.0000 mg | ORAL_TABLET | Freq: Every day | ORAL | 2 refills | Status: DC

## 2017-04-30 MED ORDER — COLCHICINE 0.6 MG PO TABS: ORAL_TABLET | ORAL | 1 refills | Status: DC

## 2017-04-30 MED ORDER — NAPROXEN 500 MG PO TABS: 500.0000 mg | ORAL_TABLET | Freq: Two times a day (BID) | ORAL | 2 refills | Status: DC

## 2017-04-30 MED ORDER — LISINOPRIL 10 MG PO TABS: 10.0000 mg | ORAL_TABLET | Freq: Every day | ORAL | 3 refills | Status: DC
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
## 2017-07-13 ENCOUNTER — Other Ambulatory Visit: Payer: Self-pay | Admitting: *Deleted

## 2017-07-13 DIAGNOSIS — M25531 Pain in right wrist: Secondary | ICD-10-CM

## 2017-07-13 MED ORDER — NAPROXEN 500 MG PO TABS: 500.0000 mg | ORAL_TABLET | Freq: Two times a day (BID) | ORAL | 0 refills | Status: DC

## 2017-08-07 ENCOUNTER — Other Ambulatory Visit: Payer: Self-pay | Admitting: "Endocrinology

## 2017-09-06 ENCOUNTER — Encounter (HOSPITAL_COMMUNITY): Payer: BLUE CROSS/BLUE SHIELD

## 2017-09-06 ENCOUNTER — Encounter (HOSPITAL_COMMUNITY): Payer: Self-pay

## 2017-09-07 ENCOUNTER — Encounter (HOSPITAL_COMMUNITY): Payer: BLUE CROSS/BLUE SHIELD

## 2017-09-08 ENCOUNTER — Encounter (HOSPITAL_COMMUNITY): Payer: BLUE CROSS/BLUE SHIELD

## 2017-09-10 ENCOUNTER — Encounter (HOSPITAL_COMMUNITY): Payer: BLUE CROSS/BLUE SHIELD

## 2017-09-13 ENCOUNTER — Encounter (HOSPITAL_COMMUNITY): Payer: BLUE CROSS/BLUE SHIELD

## 2017-09-14 ENCOUNTER — Encounter (HOSPITAL_COMMUNITY): Payer: BLUE CROSS/BLUE SHIELD

## 2017-09-14 ENCOUNTER — Other Ambulatory Visit: Payer: Self-pay | Admitting: "Endocrinology

## 2017-09-14 DIAGNOSIS — C73 Malignant neoplasm of thyroid gland: Secondary | ICD-10-CM

## 2017-09-15 ENCOUNTER — Encounter (HOSPITAL_COMMUNITY): Payer: BLUE CROSS/BLUE SHIELD

## 2017-09-16 ENCOUNTER — Ambulatory Visit: Payer: BLUE CROSS/BLUE SHIELD | Admitting: "Endocrinology

## 2017-09-17 ENCOUNTER — Encounter (HOSPITAL_COMMUNITY): Payer: BLUE CROSS/BLUE SHIELD

## 2017-09-27 ENCOUNTER — Other Ambulatory Visit (HOSPITAL_COMMUNITY): Payer: BLUE CROSS/BLUE SHIELD

## 2017-09-27 ENCOUNTER — Encounter (HOSPITAL_COMMUNITY): Payer: BLUE CROSS/BLUE SHIELD

## 2017-09-28 ENCOUNTER — Encounter (HOSPITAL_COMMUNITY): Payer: BLUE CROSS/BLUE SHIELD

## 2017-09-29 ENCOUNTER — Encounter (HOSPITAL_COMMUNITY)
Admission: RE | Admit: 2017-09-29 | Discharge: 2017-09-29 | Disposition: A | Discharge status: Home / Self Care | Payer: BLUE CROSS/BLUE SHIELD | Source: Ambulatory Visit | Attending: "Endocrinology | Admitting: "Endocrinology

## 2017-09-29 ENCOUNTER — Encounter (HOSPITAL_COMMUNITY): Payer: Self-pay

## 2017-09-29 ENCOUNTER — Encounter (HOSPITAL_COMMUNITY): Payer: BLUE CROSS/BLUE SHIELD

## 2017-09-29 DIAGNOSIS — C73 Malignant neoplasm of thyroid gland: Secondary | ICD-10-CM | POA: Diagnosis present

## 2017-09-29 MED ORDER — STERILE WATER FOR INJECTION IJ SOLN
INTRAMUSCULAR | Status: AC
  Administered 2017-09-29: 5 mL
  Filled 2017-09-29: qty 10

## 2017-09-29 MED ORDER — THYROTROPIN ALFA 1.1 MG IM SOLR
INTRAMUSCULAR | Status: AC
  Administered 2017-09-29: 0.9 mg via INTRAMUSCULAR
  Filled 2017-09-29: qty 0.9

## 2017-09-29 MED ORDER — THYROTROPIN ALFA 1.1 MG IM SOLR
0.9000 mg | INTRAMUSCULAR | Status: AC
  Administered 2017-09-29: 0.9 mg via INTRAMUSCULAR

## 2017-09-30 ENCOUNTER — Encounter (HOSPITAL_COMMUNITY)
Admission: RE | Admit: 2017-09-30 | Discharge: 2017-09-30 | Disposition: A | Discharge status: Home / Self Care | Payer: BLUE CROSS/BLUE SHIELD | Source: Ambulatory Visit | Attending: "Endocrinology | Admitting: "Endocrinology

## 2017-09-30 DIAGNOSIS — C73 Malignant neoplasm of thyroid gland: Secondary | ICD-10-CM

## 2017-09-30 MED ORDER — STERILE WATER FOR INJECTION IJ SOLN
INTRAMUSCULAR | Status: AC
  Filled 2017-09-30: qty 10

## 2017-09-30 MED ORDER — THYROTROPIN ALFA 1.1 MG IM SOLR
INTRAMUSCULAR | Status: AC
  Filled 2017-09-30: qty 0.9

## 2017-09-30 MED ORDER — THYROTROPIN ALFA 1.1 MG IM SOLR
0.9000 mg | INTRAMUSCULAR | Status: AC
  Administered 2017-09-30: 0.9 mg via INTRAMUSCULAR

## 2017-10-01 ENCOUNTER — Encounter (HOSPITAL_COMMUNITY): Payer: BLUE CROSS/BLUE SHIELD

## 2017-10-01 ENCOUNTER — Other Ambulatory Visit (HOSPITAL_COMMUNITY)
Admission: RE | Admit: 2017-10-01 | Discharge: 2017-10-01 | Disposition: A | Discharge status: Home / Self Care | Payer: BLUE CROSS/BLUE SHIELD | Source: Ambulatory Visit | Attending: "Endocrinology | Admitting: "Endocrinology

## 2017-10-01 ENCOUNTER — Encounter (HOSPITAL_COMMUNITY)
Admission: RE | Admit: 2017-10-01 | Discharge: 2017-10-01 | Disposition: A | Discharge status: Home / Self Care | Payer: BLUE CROSS/BLUE SHIELD | Source: Ambulatory Visit | Attending: "Endocrinology | Admitting: "Endocrinology

## 2017-10-01 DIAGNOSIS — C73 Malignant neoplasm of thyroid gland: Secondary | ICD-10-CM | POA: Insufficient documentation

## 2017-10-01 LAB — TSH: TSH: 69.706 u[IU]/mL — AB (ref 0.350–4.500)

## 2017-10-01 LAB — T4, FREE: FREE T4: 1.36 ng/dL — AB (ref 0.61–1.12)

## 2017-10-01 MED ORDER — SODIUM IODIDE I 131 CAPSULE
4.0000 | Freq: Once | INTRAVENOUS | Status: AC | PRN
  Administered 2017-10-01: 4.1 via ORAL

## 2017-10-02 LAB — THYROGLOBULIN ANTIBODY: THYROGLOBULIN ANTIBODY: 1.5 [IU]/mL — AB (ref 0.0–0.9)

## 2017-10-04 ENCOUNTER — Encounter (HOSPITAL_COMMUNITY)
Admission: RE | Admit: 2017-10-04 | Discharge: 2017-10-04 | Disposition: A | Discharge status: Home / Self Care | Payer: BLUE CROSS/BLUE SHIELD | Source: Ambulatory Visit | Attending: "Endocrinology | Admitting: "Endocrinology

## 2017-10-04 DIAGNOSIS — C73 Malignant neoplasm of thyroid gland: Secondary | ICD-10-CM | POA: Diagnosis not present

## 2017-10-07 LAB — COMPREHENSIVE THYROGLOBULIN: Anti-Thyroglobulin Antibodies: 1.6 IU/mL — ABNORMAL HIGH

## 2017-10-15 ENCOUNTER — Encounter: Payer: Self-pay | Admitting: "Endocrinology

## 2017-10-15 ENCOUNTER — Ambulatory Visit (INDEPENDENT_AMBULATORY_CARE_PROVIDER_SITE_OTHER): Payer: BLUE CROSS/BLUE SHIELD | Admitting: "Endocrinology

## 2017-10-15 VITALS — BP 133/94 | HR 75 | Ht 70.0 in | Wt 261.0 lb

## 2017-10-15 DIAGNOSIS — E890 Postprocedural hypothyroidism: Secondary | ICD-10-CM | POA: Diagnosis not present

## 2017-10-15 DIAGNOSIS — R7303 Prediabetes: Secondary | ICD-10-CM | POA: Diagnosis not present

## 2017-10-15 DIAGNOSIS — C73 Malignant neoplasm of thyroid gland: Secondary | ICD-10-CM

## 2017-10-15 MED ORDER — LEVOTHYROXINE SODIUM 200 MCG PO TABS: 200.0000 ug | ORAL_TABLET | Freq: Every morning | ORAL | 1 refills | Status: DC

## 2017-10-15 MED ORDER — LEVOTHYROXINE SODIUM 25 MCG PO TABS: 25.0000 ug | ORAL_TABLET | Freq: Every day | ORAL | 1 refills | Status: DC

## 2017-10-15 NOTE — Progress Notes (Signed)
Subjective:    Patient ID: Tyler [name], male    DOB: [birth year], PCP Eustaquio Maize, MD   Past Medical History:  Diagnosis Date  . Cancer ([place])    thyroid  . Gout   . Gout   . High cholesterol   . Hypertension   . Thyroid disease    thyroid cancer   Past Surgical History:  Procedure Laterality Date  . EYE SURGERY Left    cataract removal  . FRACTURE SURGERY Left 1989   ankle  . THYROIDECTOMY N/A 03/03/2013   Procedure: TOTAL THYROIDECTOMY;  Surgeon: Jamesetta So, MD;  Location: [facility];  Service: General;  Laterality: N/A;   Social History   Socioeconomic History  . Marital status: Single    Spouse name: Not on file  . Number of children: Not on file  . Years of education: Not on file  . Highest education level: Not on file  Occupational History  . Not on file  Social Needs  . Financial resource strain: Not on file  . Food insecurity:    Worry: Not on file    Inability: Not on file  . Transportation needs:    Medical: Not on file    Non-medical: Not on file  Tobacco Use  . Smoking status: Former Smoker    Packs/day: 0.50    Years: 10.00    Pack years: 5.00    Types: Cigarettes    Last attempt to quit: 11/27/2016    Years since quitting: 0.8  . Smokeless tobacco: Former Network engineer and Sexual Activity  . Alcohol use: Yes    Comment: occasional  . Drug use: No  . Sexual activity: Yes    Birth control/protection: None  Lifestyle  . Physical activity:    Days per week: Not on file    Minutes per session: Not on file  . Stress: Not on file  Relationships  . Social connections:    Talks on phone: Not on file    Gets together: Not on file    Attends religious service: Not on file    Active member of club or organization: Not on file    Attends meetings of clubs or organizations: Not on file    Relationship status: Not on file  Other Topics Concern  . Not on file  Social History Narrative  . Not on file   Outpatient Encounter Medications  as of 10/15/2017  Medication Sig  . cetirizine (ZYRTEC) 10 MG tablet Take 1 tablet (10 mg total) by mouth daily.  . colchicine 0.6 MG tablet TAKE 1 TABLET (0.6 MG TOTAL) BY MOUTH DAILY.  . febuxostat (ULORIC) 40 MG tablet Take 1 tablet (40 mg total) daily by mouth.  . levothyroxine (SYNTHROID, LEVOTHROID) 200 MCG tablet Take 1 tablet (200 mcg total) by mouth every morning.  Marland Kitchen levothyroxine (SYNTHROID, LEVOTHROID) 25 MCG tablet Take 1 tablet (25 mcg total) by mouth daily before breakfast.  . lisinopril (PRINIVIL,ZESTRIL) 10 MG tablet Take 1 tablet (10 mg total) daily by mouth.  . naproxen (NAPROSYN) 500 MG tablet Take 1 tablet (500 mg total) by mouth 2 (two) times daily with a meal.  . promethazine (PHENERGAN) 25 MG tablet Take 1 tablet (25 mg total) by mouth every 8 (eight) hours as needed for nausea or vomiting.  . [DISCONTINUED] levothyroxine (SYNTHROID, LEVOTHROID) 200 MCG tablet Take 1 tablet (200 mcg total) by mouth every morning.  . [DISCONTINUED] levothyroxine (SYNTHROID, LEVOTHROID) 25 MCG tablet TAKE  1 TABLET (25 MCG TOTAL) BY MOUTH DAILY BEFORE BREAKFAST.   No facility-administered encounter medications on file as of 10/15/2017.    ALLERGIES: No Known Allergies VACCINATION STATUS:  There is no immunization history on file for this patient.  HPI  42 yr old male with medical history as above . He underwent total thyroidectomy for thyroid cancer on 03/03/2013 by Dr. Arnoldo Morale at [facility] in [place].   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 no lymph nodes were identified.  He received his first Thyrogen stimulated I -131 thyroid remnant ablation on 05/05/13 same, with no evidence of distant metastasis. -Subsequent Thyrogen Stimulated  whole body scan in January 2017  was reported to be unremarkable for tumor recurrence/distant metastasis.    He is on  levothyroxine 225 mcg po qam.  reports compliance,  no new complaints.  He reports better compliance this time. -  thyroid/neck ultrasound  on 07/27/2014 was negative for thyroid remnant. - Another surveillance thyroid/neck ultrasound on 06/09/2016 was negative for thyroid remnant.  -Most recently, on October 04, 2017 he underwent nausea surveillance thyroid-stimulating whole-body scan with negative findings for tumor recurrence or distant metastasis. - He also has hypertension, prediabetes.   he did comply with BP medication I prescribed for him. He is lost significant amount of weight over the last year, largely due to dietary modifications.   Review of Systems Constitutional: + weight loss, - fatigue, no subjective hyperthermia/ hypothermia.  Eyes: no blurry vision, no xerophthalmia ENT: no sore throat, no nodules palpated in throat, no dysphagia/odynophagia, no hoarseness Cardiovascular: No chest pain, no palpitations, no shortness of breath. Respiratory: no cough/SOB Gastrointestinal: no N/V/D/C Musculoskeletal: no muscle/joint aches Skin: no rashes Neurological: No tremors, no tingling.   Psychiatric: no depression/anxiety  Objective:    BP (!) 133/94   Pulse 75   Ht 5\' 10"  (1.778 m)   Wt 261 lb (118.4 kg)   BMI 37.45 kg/m   Wt Readings from Last 3 Encounters:  10/15/17 261 lb (118.4 kg)  04/30/17 276 lb 9.6 oz (125.5 kg)  03/19/17 263 lb (119.3 kg)    Physical Exam Constitutional: Obese with BMI of 37.4  improving from 43.3 , not in acute distress.  Eyes: PERRLA, EOMI, no exophthalmos ENT: moist mucous membranes, post thyroidectomy scar on anterior lower neck ,  no cervical lymphadenopathy Musculoskeletal: no deformities, strength intact in all 4 Skin: moist, warm, no rashes Neurological: no tremor with outstretched hands, DTR normal in all 4  CMP ( most recent) CMP     Component Value Date/Time   NA 140 03/02/2017 0856   NA 140 09/11/2016 1224   K 4.6 03/02/2017 0856   CL 108 03/02/2017 0856    CO2 27 03/02/2017 0856   GLUCOSE 107 (H) 03/02/2017 0856   BUN 8 03/02/2017 0856   BUN 8 09/11/2016 1224   CREATININE 0.87 03/02/2017 0856   CALCIUM 9.3 03/02/2017 0856   PROT 6.4 03/02/2017 0856   PROT 7.3 03/19/2016 1453   ALBUMIN 4.3 03/19/2016 1453   AST 15 03/02/2017 0856   ALT 23 03/02/2017 0856   ALKPHOS 89 03/19/2016 1453   BILITOT 0.3 03/02/2017 0856   BILITOT 0.5 03/19/2016 1453   GFRNONAA 109 03/02/2017 0856   GFRAA 127 03/02/2017 0856     Diabetic Labs (most recent): Lab Results  Component Value Date   HGBA1C 5.7 (H) 03/02/2017   HGBA1C 5.6 06/05/2016  HGBA1C 6.0 (H) 02/26/2016   Results for CHOU, BUSLER (MRN [id number]) as of 10/15/2017 11:39  Ref. Range 10/01/2017 08:35  TSH Latest Ref Range: 0.350 - 4.500 uIU/mL 69.706 (H)  T4,Free(Direct) Latest Ref Range: 0.61 - 1.12 ng/dL 1.36 (H)  Anti-Thyroglobulin Antibodies Latest Units: IU/mL 1.6 (H)  Thyroglobulin (ICMA) Latest Units: ng/mL Comment  Thyroglobulin Latest Units: ng/mL <2.0  Thyroglobulin Antibody Latest Ref Range: 0.0 - 0.9 IU/mL 1.5 (H)    Assessment & Plan:   1. Postsurgical hypothyroidism  -His thyroid function tests are consistent with appropriate replacement.  - He has been more consistent taking his levothyroxine since last time.  I urged him to continue levothyroxine 225 g by mouth every morning .  - We discussed about correct intake of levothyroxine, at fasting, with water, separated by at least 30 minutes from breakfast, and separated by more than 4 hours from calcium, iron, multivitamins, acid reflux medications (PPIs). -Patient is made aware of the fact that thyroid hormone replacement is needed for life, dose to be adjusted by periodic monitoring of thyroid function tests. - TSH target for thyroid cancer patients is between 0.1-0.5.  2. Malignant neoplasm of thyroid gland ([place])  -  03/03/2013:  Status post  near total thyroidectomy for stage 1 multifocal FVPTC. pT1NxMx . Left  lobe multinodular adenomatous goiter with foci of papillary microcarcinoma , follicular variant. 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 - After his surgery, he received  14mCi of I-131 remnant ablation on 05/05/13,  post therapy whole body scar showing no evidence of distant metastasis.  - surveillance ultrasound of the neck/thyroid in February 2016 was negative for thyroid remnants.  - whole-body scan with Thyrogen was negative for tumor recurrence on 07/05/2015.  - another surveillance  thyroid/neck ultrasound on 06/09/2016 is remarkable for surgically absent thyroid.  -Most recently, on October 04, 2017 he underwent nausea surveillance thyroid-stimulating whole-body scan with negative findings for tumor recurrence or distant metastasis. -His previous labs show thyroglobulin level undetectable (< 2)  -He will be considered for another ultrasound in 1 year.  3. Essential hypertension: -His blood pressure is controlled to target.  Is currently only on lisinopril 10 mg p.o. daily.    4. Prediabetes - For prediabetes, he is given detailed DM education, advised to exercise and limit Carbs. His recent A1c is 5.7% now. - He has lost 10 pounds over the last year, total of 50 pounds overall.  -He will not need medications at this time.   - He is extensively counseled for smoking cessation.  5. obesity was BMI 37.7 - Caloric restriction for weight loss is advised. - I advised patient to maintain close follow up with Eustaquio Maize, MD for primary care needs. - Time spent with the patient: 25 min, of which >50% was spent in reviewing his  current and  previous labs, previous treatments, and medications doses and developing a plan for long-term care.  Leighton Ruff Clover participated in the discussions, expressed understanding, and voiced agreement with the above plans.  All questions were answered to his satisfaction. he is encouraged to contact  clinic should he have any questions or concerns prior to his return visit.  Follow up plan: Return in about 6 months (around 04/16/2018) for follow up with pre-visit labs.  Glade Lloyd, MD Phone: [phone]  Fax: [phone]  This note was partially dictated with voice recognition software. Similar sounding words can be transcribed inadequately or may not  be corrected upon review.  10/15/2017, 11:48 AM

## 2017-11-05 ENCOUNTER — Ambulatory Visit: Payer: BLUE CROSS/BLUE SHIELD | Admitting: Pediatrics

## 2018-01-05 ENCOUNTER — Other Ambulatory Visit: Payer: Self-pay | Admitting: Pediatrics

## 2018-01-05 DIAGNOSIS — M109 Gout, unspecified: Secondary | ICD-10-CM

## 2018-01-07 ENCOUNTER — Ambulatory Visit: Payer: BLUE CROSS/BLUE SHIELD | Admitting: Family Medicine

## 2018-01-07 ENCOUNTER — Encounter: Payer: Self-pay | Admitting: Family Medicine

## 2018-01-07 VITALS — BP 143/113 | HR 93 | Temp 97.2°F | Ht 70.0 in | Wt 268.0 lb

## 2018-01-07 DIAGNOSIS — M25561 Pain in right knee: Secondary | ICD-10-CM | POA: Diagnosis not present

## 2018-01-07 DIAGNOSIS — I10 Essential (primary) hypertension: Secondary | ICD-10-CM | POA: Diagnosis not present

## 2018-01-07 DIAGNOSIS — M109 Gout, unspecified: Secondary | ICD-10-CM | POA: Diagnosis not present

## 2018-01-07 MED ORDER — COLCHICINE 0.6 MG PO TABS: ORAL_TABLET | ORAL | 0 refills | Status: DC

## 2018-01-07 MED ORDER — FEBUXOSTAT 40 MG PO TABS: 40.0000 mg | ORAL_TABLET | Freq: Every day | ORAL | 0 refills | Status: DC

## 2018-01-07 MED ORDER — METHYLPREDNISOLONE ACETATE 80 MG/ML IJ SUSP
80.0000 mg | Freq: Once | INTRAMUSCULAR | Status: AC
  Administered 2018-01-07: 80 mg via INTRAMUSCULAR

## 2018-01-07 MED ORDER — NAPROXEN 500 MG PO TABS: 500.0000 mg | ORAL_TABLET | Freq: Two times a day (BID) | ORAL | 0 refills | Status: DC

## 2018-01-07 NOTE — Patient Instructions (Signed)
You had labs performed today.  You will be contacted with the results of the labs once they are available, usually in the next 3 business days for routine lab work.  If you had a pap smear or biopsy performed, expect to be contacted in about 7-10 days.   Baker Cyst A Baker cyst, also called a popliteal cyst, is a sac-like growth that forms at the back of the knee. The cyst forms when the fluid-filled sac (bursa) that cushions the knee joint becomes enlarged. The bursa that becomes a Baker cyst is located at the back of the knee joint. What are the causes? In most cases, a Baker cyst results from another knee problem that causes swelling inside the knee. This makes the fluid inside the knee joint (synovial fluid) flow into the bursa behind the knee, causing the bursa to enlarge. What increases the risk? You may be more likely to develop a Baker cyst if you already have a knee problem, such as:  A tear in cartilage that cushions the knee joint (meniscal tear).  A tear in the tissues that connect the bones of the knee joint (ligament tear).  Knee swelling from osteoarthritis, rheumatoid arthritis, or gout.  What are the signs or symptoms? A Baker cyst does not always cause symptoms. A lump behind the knee may be the only sign of the condition. The lump may be painful, especially when the knee is straightened. If the lump is painful, the pain may come and go. The knee may also be stiff. Symptoms may quickly get more severe if the cyst breaks open (ruptures). If your cyst ruptures, signs and symptoms may affect the knee and the back of the lower leg (calf) and may include:  Sudden or worsening pain.  Swelling.  Bruising.  How is this diagnosed? This condition may be diagnosed based on your symptoms and medical history. Your health care provider will also do a physical exam. This may include:  Feeling the cyst to check whether it is tender.  Checking your knee for signs of another knee  condition that causes swelling.  You may have imaging tests, such as:  X-rays.  MRI.  Ultrasound.  How is this treated? A Baker cyst that is not painful may go away without treatment. If the cyst gets large or painful, it will likely get better if the underlying knee problem is treated. Treatment for a Baker cyst may include:  Resting.  Keeping weight off of the knee. This means not leaning on the knee to support your body weight.  NSAIDs to reduce pain and swelling.  A procedure to drain the fluid from the cyst with a needle (aspiration). You may also get an injection of a medicine that reduces swelling (steroid).  Surgery. This may be needed if other treatments do not work. This usually involves correcting knee damage and removing the cyst.  Follow these instructions at home:  Take over-the-counter and prescription medicines only as told by your health care provider.  Rest and return to your normal activities as told by your health care provider. Avoid activities that make pain or swelling worse. Ask your health care provider what activities are safe for you.  Keep all follow-up visits as told by your health care provider. This is important. Contact a health care provider if:  You have knee pain, stiffness, or swelling that does not get better. Get help right away if:  You have sudden or worsening pain and swelling in your calf area.  This information is not intended to replace advice given to you by your health care provider. Make sure you discuss any questions you have with your health care provider. Document Released: 06/08/2005 Document Revised: 02/27/2016 Document Reviewed: 02/27/2016 Elsevier Interactive Patient Education  2018 [employer].

## 2018-01-07 NOTE — Progress Notes (Signed)
Subjective: CC: Gout PCP: Eustaquio Maize, MD VQQ:[id number] M Steines is a 42 y.o. male presenting to clinic today for:  1. Left knee pain Patient reports onset of left-sided knee pain about 2 months ago.  It has been intermittent up into the last several days.  He notes that over the last 3 to 4 days, he is been unable to ambulate on left lower extremity and has required crutches.  He notes associated swelling of the left knee.  He thought that this was gout and therefore was using his home gout medications of Naprosyn, colchicine and Uloric.  It has not improved.  He notes that he had a similar episode a couple of years ago and was referred to orthopedics.  Orthopedics ultimately discovered that he had a cyst in the back of his knee that required drainage.  He denies any preceding trauma.  He reports that pain seems to radiate to the left hip over the last couple of days.  He thinks that this may be a result of him changing his gait.  He also had a couple of nodules along the right pinky which caused swelling but that has since resolved.  2.  Hypertension Patient reports that he does not take his blood pressure medication during the summer times because he "sweats and the blood pressure goes too low".  He notes that he has had issues with multiple blood pressure medications in the past, citing that his blood pressure has got as low as 60/40 and he required hospitalization.  He states that his blood pressure is always elevated when he comes to the doctor's office.  His blood pressure typically runs him 130s over 90s at home.  Denies any chest pain, shortness of breath, visual disturbance or dizziness.   ROS: Per HPI  No Known Allergies Past Medical History:  Diagnosis Date  . Cancer ([place])    thyroid  . Gout   . Gout   . High cholesterol   . Hypertension   . Thyroid disease    thyroid cancer    Current Outpatient Medications:  .  cetirizine (ZYRTEC) 10 MG tablet, Take 1 tablet (10 mg  total) by mouth daily., Disp: 90 tablet, Rfl: 1 .  colchicine 0.6 MG tablet, TAKE 1 TABLET (0.6 MG TOTAL) BY MOUTH DAILY., Disp: 90 tablet, Rfl: 1 .  febuxostat (ULORIC) 40 MG tablet, Take 1 tablet (40 mg total) daily by mouth., Disp: 30 tablet, Rfl: 2 .  levothyroxine (SYNTHROID, LEVOTHROID) 200 MCG tablet, Take 1 tablet (200 mcg total) by mouth every morning., Disp: 90 tablet, Rfl: 1 .  levothyroxine (SYNTHROID, LEVOTHROID) 25 MCG tablet, Take 1 tablet (25 mcg total) by mouth daily before breakfast., Disp: 90 tablet, Rfl: 1 .  lisinopril (PRINIVIL,ZESTRIL) 10 MG tablet, Take 1 tablet (10 mg total) daily by mouth., Disp: 90 tablet, Rfl: 3 .  naproxen (NAPROSYN) 500 MG tablet, Take 1 tablet (500 mg total) by mouth 2 (two) times daily with a meal., Disp: 180 tablet, Rfl: 0 .  promethazine (PHENERGAN) 25 MG tablet, Take 1 tablet (25 mg total) by mouth every 8 (eight) hours as needed for nausea or vomiting., Disp: 20 tablet, Rfl: 0 Social History   Socioeconomic History  . Marital status: Single    Spouse name: Not on file  . Number of children: Not on file  . Years of education: Not on file  . Highest education level: Not on file  Occupational History  . Not on file  Social Needs  . Financial resource strain: Not on file  . Food insecurity:    Worry: Not on file    Inability: Not on file  . Transportation needs:    Medical: Not on file    Non-medical: Not on file  Tobacco Use  . Smoking status: Former Smoker    Packs/day: 0.50    Years: 10.00    Pack years: 5.00    Types: Cigarettes    Last attempt to quit: 11/27/2016    Years since quitting: 1.1  . Smokeless tobacco: Former Network engineer and Sexual Activity  . Alcohol use: Yes    Comment: occasional  . Drug use: No  . Sexual activity: Yes    Birth control/protection: None  Lifestyle  . Physical activity:    Days per week: Not on file    Minutes per session: Not on file  . Stress: Not on file  Relationships  . Social  connections:    Talks on phone: Not on file    Gets together: Not on file    Attends religious service: Not on file    Active member of club or organization: Not on file    Attends meetings of clubs or organizations: Not on file    Relationship status: Not on file  . Intimate partner violence:    Fear of current or ex partner: Not on file    Emotionally abused: Not on file    Physically abused: Not on file    Forced sexual activity: Not on file  Other Topics Concern  . Not on file  Social History Narrative  . Not on file   Family History  Problem Relation Age of Onset  . Hypertension Father   . Hyperlipidemia Father     Objective: Office vital signs reviewed. BP (!) 143/113   Pulse 93   Temp (!) 97.2 F (36.2 C) (Oral)   Ht 5\' 10"  (1.778 m)   Wt 268 lb (121.6 kg)   BMI 38.45 kg/m   Physical Examination:  General: Awake, alert, obese, No acute distress HEENT: EOMI, sclera white, MMM Cardio: regular rate and rhythm, S1S2 heard, no murmurs appreciated Pulm: clear to auscultation bilaterally, no wheezes, rhonchi or rales; normal work of breathing on room air Extremities: warm, well perfused, No edema, cyanosis or clubbing; +2 pulses bilaterally MSK: antalgic gait and station; uses crutches for ambulation  Left knee: Mild soft tissue swelling appreciated.  No gross effusion.  No gross discoloration.  No tenderness to palpation to the patella, patellar tendon or quad tendon.  He does have mild tenderness to palpation to the tibial tuberosity.  No palpable posterior popliteal masses.  No tenderness.  No ligamentous laxity.  Thessaly not tested secondary to pain limiting exam.  Left hip: Exam was limited by pain in the knee.  No tenderness to palpation to the trochanteric bursa. Skin: dry; intact; no rashes or lesions Neuro: light touch sensation in tact  Assessment/ Plan: 42 y.o. male   1. Acute pain of right knee Does not appear to be a gout flare.  Will check labs for  completion however given his history.  I do question meniscal injury with possible Baker's cyst fluid collection.  Though palpable mass within the posterior popliteal fossa not appreciated during today's exam.  His symptoms are refractory to OTC medications and gout medication.  Therefore given a dose of Depo-Medrol IM here today.  Currently trying to coordinate care with orthopedics in [place].  Will defer  imaging studies to their office. - Basic Metabolic Panel - CBC with Differential - Uric Acid - Ambulatory referral to Orthopedic Surgery - methylPREDNISolone acetate (DEPO-MEDROL) injection 80 mg  2. Elevated blood pressure reading with diagnosis of hypertension Patient is asymptomatic.  Blood pressure likely worsened by white coat syndrome and pain.  We discussed at length that untreated blood pressure could result in cardiovascular and renal complications.  Patient was good understanding.  He will follow-up with his PCP in 2 weeks for blood pressure recheck.  Consider losartan given history of gout.  3. Gout, unspecified cause, unspecified chronicity, unspecified site - febuxostat (ULORIC) 40 MG tablet; Take 1 tablet (40 mg total) by mouth daily.  Dispense: 30 tablet; Refill: 0 - colchicine 0.6 MG tablet; TAKE 1 TABLET (0.6 MG TOTAL) BY MOUTH DAILY.  Dispense: 90 tablet; Refill: 0   Orders Placed This Encounter  Procedures  . Basic Metabolic Panel  . CBC with Differential  . Uric Acid  . Ambulatory referral to Orthopedic Surgery    Referral Priority:   Urgent    Referral Type:   Surgical    Referral Reason:   Specialty Services Required    Requested Specialty:   Orthopedic Surgery    Number of Visits Requested:   1   Meds ordered this encounter  Medications  . methylPREDNISolone acetate (DEPO-MEDROL) injection 80 mg  . febuxostat (ULORIC) 40 MG tablet    Sig: Take 1 tablet (40 mg total) by mouth daily.    Dispense:  30 tablet    Refill:  0  . naproxen (NAPROSYN) 500 MG  tablet    Sig: Take 1 tablet (500 mg total) by mouth 2 (two) times daily with a meal. (prn pain)    Dispense:  180 tablet    Refill:  0  . colchicine 0.6 MG tablet    Sig: TAKE 1 TABLET (0.6 MG TOTAL) BY MOUTH DAILY.    Dispense:  90 tablet    Refill:  [place], DO [place] [phone]

## 2018-01-08 LAB — CBC WITH DIFFERENTIAL/PLATELET
Basophils Absolute: 0.1 10*3/uL (ref 0.0–0.2)
Basos: 0 %
EOS (ABSOLUTE): 0.2 10*3/uL (ref 0.0–0.4)
Eos: 2 %
HEMATOCRIT: 45.4 % (ref 37.5–51.0)
HEMOGLOBIN: 14.9 g/dL (ref 13.0–17.7)
Immature Grans (Abs): 0.1 10*3/uL (ref 0.0–0.1)
Immature Granulocytes: 1 %
LYMPHS ABS: 2.6 10*3/uL (ref 0.7–3.1)
Lymphs: 22 %
MCH: 29.9 pg (ref 26.6–33.0)
MCHC: 32.8 g/dL (ref 31.5–35.7)
MCV: 91 fL (ref 79–97)
MONOCYTES: 6 %
Monocytes Absolute: 0.7 10*3/uL (ref 0.1–0.9)
NEUTROS ABS: 8 10*3/uL — AB (ref 1.4–7.0)
Neutrophils: 69 %
Platelets: 261 10*3/uL (ref 150–450)
RBC: 4.99 x10E6/uL (ref 4.14–5.80)
RDW: 12 % — ABNORMAL LOW (ref 12.3–15.4)
WBC: 11.7 10*3/uL — ABNORMAL HIGH (ref 3.4–10.8)

## 2018-01-08 LAB — BASIC METABOLIC PANEL
BUN/Creatinine Ratio: 14 (ref 9–20)
BUN: 13 mg/dL (ref 6–20)
CALCIUM: 10 mg/dL (ref 8.7–10.2)
CHLORIDE: 102 mmol/L (ref 96–106)
CO2: 22 mmol/L (ref 20–29)
Creatinine, Ser: 0.96 mg/dL (ref 0.76–1.27)
GFR calc Af Amer: 115 mL/min/{1.73_m2} (ref >59)
GFR calc non Af Amer: 99 mL/min/{1.73_m2} (ref >59)
Glucose: 101 mg/dL — ABNORMAL HIGH (ref 65–99)
Potassium: 5.1 mmol/L (ref 3.5–5.2)
SODIUM: 140 mmol/L (ref 134–144)

## 2018-01-08 LAB — URIC ACID: Uric Acid: 8.7 mg/dL — ABNORMAL HIGH (ref 3.7–8.6)

## 2018-01-19 ENCOUNTER — Ambulatory Visit: Payer: BLUE CROSS/BLUE SHIELD | Admitting: Pediatrics

## 2018-01-29 ENCOUNTER — Other Ambulatory Visit: Payer: Self-pay | Admitting: Family Medicine

## 2018-01-29 DIAGNOSIS — M109 Gout, unspecified: Secondary | ICD-10-CM

## 2018-02-17 ENCOUNTER — Ambulatory Visit (INDEPENDENT_AMBULATORY_CARE_PROVIDER_SITE_OTHER): Payer: BLUE CROSS/BLUE SHIELD | Admitting: Pediatrics

## 2018-02-17 ENCOUNTER — Encounter: Payer: Self-pay | Admitting: Pediatrics

## 2018-02-17 DIAGNOSIS — M109 Gout, unspecified: Secondary | ICD-10-CM | POA: Diagnosis not present

## 2018-02-17 DIAGNOSIS — I10 Essential (primary) hypertension: Secondary | ICD-10-CM | POA: Diagnosis not present

## 2018-02-17 MED ORDER — LISINOPRIL 10 MG PO TABS: 10.0000 mg | ORAL_TABLET | Freq: Every day | ORAL | 2 refills | Status: DC

## 2018-02-17 MED ORDER — FEBUXOSTAT 40 MG PO TABS: 40.0000 mg | ORAL_TABLET | Freq: Every day | ORAL | 2 refills | Status: DC

## 2018-02-17 NOTE — Progress Notes (Signed)
  Subjective:   Patient ID: [name], male    DOB: 02-27-1976, 42 y.o.   MRN: [id number] CC: Gout (follow up)  HPI: DONAVYN FECHER is a 42 y.o. male   Left knee has been bothering him.  Pain improved after getting a steroid injection.  Has upcoming follow-up with orthopedics.  Still does not feel entirely back to normal.  No swelling, no redness.  He does not think it was a gout flare.  He thinks last gout flare was over a year ago.  He has been taking colchicine or Uloric fairly regularly.  He ran out of the Uloric so had to stop a couple months ago, has had it refilled since then has been taking it regularly. Has upcoming f/u with orthopedics. Steroid injection helped the pain in the past.  Not taking blood pressure medicine.  He does not have it at home right now.  Blood pressures have been well controlled.  Blood pressure often improves during the summertime when he is outside working regularly.  Relevant past medical, surgical, family and social history reviewed. Allergies and medications reviewed and updated. Social History   Tobacco Use  Smoking Status Former Smoker  . Packs/day: 0.50  . Years: 10.00  . Pack years: 5.00  . Types: Cigarettes  . Last attempt to quit: 11/27/2016  . Years since quitting: 1.2  Smokeless Tobacco Former Systems developer   ROS: Per HPI   Objective:    BP 116/82   Pulse 95   Temp 99.5 F (37.5 C) (Oral)   Ht 5\' 10"  (1.778 m)   Wt 274 lb 6.4 oz (124.5 kg)   BMI 39.37 kg/m   Wt Readings from Last 3 Encounters:  02/17/18 274 lb 6.4 oz (124.5 kg)  01/07/18 268 lb (121.6 kg)  10/15/17 261 lb (118.4 kg)    Gen: NAD, alert, cooperative with exam, NCAT EYES: EOMI, no conjunctival injection, or no icterus ENT:OP without erythema LYMPH: no cervical LAD CV: NRRR, normal S1/S2, no murmur, distal pulses 2+ b/l Resp: CTABL, no wheezes, normal WOB Abd: +BS, soft, NTND. no guarding or organomegaly Ext: No edema, warm Neuro: Alert and oriented, strength equal  b/l UE and LE, coordination grossly normal  Assessment & Plan:  Latron was seen today for gout.  Diagnoses and all orders for this visit:  Gout, unspecified cause, unspecified chronicity, unspecified site Stable, continue below. -     febuxostat (ULORIC) 40 MG tablet; Take 1 tablet (40 mg total) by mouth daily.  Essential hypertension Adequate control now.  Continue to check blood pressures at home.  Restart lisinopril for blood pressures greater than 130 or greater than 80. -     lisinopril (PRINIVIL,ZESTRIL) 10 MG tablet; Take 1 tablet (10 mg total) by mouth daily.   Follow up plan: 6 months, sooner if needed Assunta Found, MD [place]

## 2018-03-19 ENCOUNTER — Other Ambulatory Visit: Payer: Self-pay | Admitting: "Endocrinology

## 2018-03-19 ENCOUNTER — Other Ambulatory Visit: Payer: Self-pay | Admitting: Family Medicine

## 2018-03-19 DIAGNOSIS — M109 Gout, unspecified: Secondary | ICD-10-CM

## 2018-04-15 ENCOUNTER — Ambulatory Visit: Payer: BLUE CROSS/BLUE SHIELD | Admitting: "Endocrinology

## 2018-05-24 ENCOUNTER — Other Ambulatory Visit: Payer: Self-pay | Admitting: "Endocrinology

## 2018-05-24 DIAGNOSIS — R7303 Prediabetes: Secondary | ICD-10-CM

## 2018-05-24 DIAGNOSIS — C73 Malignant neoplasm of thyroid gland: Secondary | ICD-10-CM | POA: Diagnosis not present

## 2018-05-25 LAB — COMPREHENSIVE METABOLIC PANEL
AG Ratio: 1.4 (calc) (ref 1.0–2.5)
ALKALINE PHOSPHATASE (APISO): 96 U/L (ref 40–115)
ALT: 27 U/L (ref 9–46)
AST: 15 U/L (ref 10–40)
Albumin: 4.2 g/dL (ref 3.6–5.1)
BILIRUBIN TOTAL: 0.5 mg/dL (ref 0.2–1.2)
BUN: 18 mg/dL (ref 7–25)
CALCIUM: 9.8 mg/dL (ref 8.6–10.3)
CHLORIDE: 106 mmol/L (ref 98–110)
CO2: 24 mmol/L (ref 20–32)
Creat: 1.02 mg/dL (ref 0.60–1.35)
GLOBULIN: 3.1 g/dL (ref 1.9–3.7)
Glucose, Bld: 104 mg/dL — ABNORMAL HIGH (ref 65–99)
Potassium: 4.8 mmol/L (ref 3.5–5.3)
Sodium: 140 mmol/L (ref 135–146)
Total Protein: 7.3 g/dL (ref 6.1–8.1)

## 2018-05-25 LAB — HEMOGLOBIN A1C
EAG (MMOL/L): 7 (calc)
HEMOGLOBIN A1C: 6 %{Hb} — AB (ref <5.7)
MEAN PLASMA GLUCOSE: 126 (calc)

## 2018-05-25 LAB — T4, FREE: Free T4: 1.5 ng/dL (ref 0.8–1.8)

## 2018-05-25 LAB — TSH: TSH: 0.2 mIU/L — ABNORMAL LOW (ref 0.40–4.50)

## 2018-06-03 ENCOUNTER — Encounter: Payer: Self-pay | Admitting: "Endocrinology

## 2018-06-03 ENCOUNTER — Ambulatory Visit (INDEPENDENT_AMBULATORY_CARE_PROVIDER_SITE_OTHER): Payer: BLUE CROSS/BLUE SHIELD | Admitting: "Endocrinology

## 2018-06-03 VITALS — BP 131/89 | HR 76 | Ht 70.0 in | Wt 282.0 lb

## 2018-06-03 DIAGNOSIS — R7303 Prediabetes: Secondary | ICD-10-CM | POA: Diagnosis not present

## 2018-06-03 DIAGNOSIS — E890 Postprocedural hypothyroidism: Secondary | ICD-10-CM

## 2018-06-03 DIAGNOSIS — C73 Malignant neoplasm of thyroid gland: Secondary | ICD-10-CM

## 2018-06-03 DIAGNOSIS — I10 Essential (primary) hypertension: Secondary | ICD-10-CM

## 2018-06-03 MED ORDER — LEVOTHYROXINE SODIUM 25 MCG PO TABS: 25.0000 ug | ORAL_TABLET | Freq: Every day | ORAL | 1 refills | Status: DC

## 2018-06-03 MED ORDER — LEVOTHYROXINE SODIUM 200 MCG PO TABS: 200.0000 ug | ORAL_TABLET | Freq: Every morning | ORAL | 1 refills | Status: DC

## 2018-06-03 NOTE — Progress Notes (Signed)
Endocrinology follow-up note   Subjective:    Patient ID: Tyler [name], male    DOB: 1976-05-13, PCP Eustaquio Maize, MD   Past Medical History:  Diagnosis Date  . Cancer ([place])    thyroid  . Gout   . Gout   . High cholesterol   . Hypertension   . Thyroid disease    thyroid cancer   Past Surgical History:  Procedure Laterality Date  . EYE SURGERY Left    cataract removal  . FRACTURE SURGERY Left 1989   ankle  . THYROIDECTOMY N/A 03/03/2013   Procedure: TOTAL THYROIDECTOMY;  Surgeon: Jamesetta So, MD;  Location: [facility];  Service: General;  Laterality: N/A;   Social History   Socioeconomic History  . Marital status: Single    Spouse name: Not on file  . Number of children: Not on file  . Years of education: Not on file  . Highest education level: Not on file  Occupational History  . Not on file  Social Needs  . Financial resource strain: Not on file  . Food insecurity:    Worry: Not on file    Inability: Not on file  . Transportation needs:    Medical: Not on file    Non-medical: Not on file  Tobacco Use  . Smoking status: Former Smoker    Packs/day: 0.50    Years: 10.00    Pack years: 5.00    Types: Cigarettes    Last attempt to quit: 11/27/2016    Years since quitting: 1.5  . Smokeless tobacco: Former Network engineer and Sexual Activity  . Alcohol use: Yes    Comment: occasional  . Drug use: No  . Sexual activity: Yes    Birth control/protection: None  Lifestyle  . Physical activity:    Days per week: Not on file    Minutes per session: Not on file  . Stress: Not on file  Relationships  . Social connections:    Talks on phone: Not on file    Gets together: Not on file    Attends religious service: Not on file    Active member of club or organization: Not on file    Attends meetings of clubs or organizations: Not on file    Relationship status: Not on file  Other Topics Concern  . Not on file  Social History Narrative  . Not on file    Outpatient Encounter Medications as of 06/03/2018  Medication Sig  . colchicine 0.6 MG tablet TAKE 1 TABLET (0.6 MG TOTAL) BY MOUTH DAILY.  . febuxostat (ULORIC) 40 MG tablet Take 1 tablet (40 mg total) by mouth daily.  Marland Kitchen levothyroxine (SYNTHROID, LEVOTHROID) 200 MCG tablet Take 1 tablet (200 mcg total) by mouth every morning.  Marland Kitchen levothyroxine (SYNTHROID, LEVOTHROID) 25 MCG tablet Take 1 tablet (25 mcg total) by mouth daily before breakfast.  . lisinopril (PRINIVIL,ZESTRIL) 10 MG tablet Take 1 tablet (10 mg total) by mouth daily.  . naproxen (NAPROSYN) 500 MG tablet TAKE 1 TABLET BY MOUTH 2 (TWO) TIMES DAILY WITH A MEAL. (AS NEEDED FOR PAIN)  . [DISCONTINUED] levothyroxine (SYNTHROID, LEVOTHROID) 200 MCG tablet TAKE 1 TABLET (200 MCG TOTAL) BY MOUTH EVERY MORNING.  . [DISCONTINUED] levothyroxine (SYNTHROID, LEVOTHROID) 25 MCG tablet Take 1 tablet (25 mcg total) by mouth daily before breakfast.   No facility-administered encounter medications on file as of 06/03/2018.    ALLERGIES: No Known Allergies VACCINATION STATUS:  There is no immunization history  on file for this patient.  HPI  42 yr old male with medical history as above . He underwent total thyroidectomy for thyroid cancer on 03/03/2013 by Dr. Arnoldo Morale at [facility] in [place].   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 no lymph nodes were identified.  He received his first Thyrogen stimulated I -131 thyroid remnant ablation on 05/05/13 same, with no evidence of distant metastasis. -Subsequent Thyrogen Stimulated  whole body scan in January 2017  was reported to be unremarkable for tumor recurrence/distant metastasis.    He is on  levothyroxine 225 mcg po qam. reports compliance,  no new complaints.  He reports better compliance this time. -  thyroid/neck ultrasound  on 07/27/2014 was negative for thyroid remnant. - Another  surveillance thyroid/neck ultrasound on 06/09/2016 was negative for thyroid remnant.  -Most recently, on October 04, 2017 he underwent nausea surveillance thyroid-stimulating whole-body scan with negative findings for tumor recurrence or distant metastasis. - He also has hypertension, prediabetes.   he did comply with BP medication I prescribed for him. He is gaining weight since last visit, admits to diet indiscretions.     Review of Systems Constitutional: + weight gain, - fatigue, no subjective hyperthermia/ hypothermia.  Eyes: no blurry vision, no xerophthalmia ENT: no sore throat, no nodules palpated in throat, no dysphagia/odynophagia, no hoarseness Cardiovascular: No chest pain, no palpitations, no shortness of breath.  Musculoskeletal: no muscle/joint aches Skin: no rashes Neurological: No tremors, no tingling.   Psychiatric: no depression/anxiety  Objective:    BP 131/89   Pulse 76   Ht 5\' 10"  (1.778 m)   Wt 282 lb (127.9 kg)   BMI 40.46 kg/m   Wt Readings from Last 3 Encounters:  06/03/18 282 lb (127.9 kg)  02/17/18 274 lb 6.4 oz (124.5 kg)  01/07/18 268 lb (121.6 kg)    Physical Exam Constitutional: Obese with BMI of 40.46,  not in acute distress.  Eyes: PERRLA, EOMI, no exophthalmos ENT: moist mucous membranes, post thyroidectomy scar on anterior lower neck ,  no cervical lymphadenopathy Musculoskeletal: no deformities, strength intact in all 4 Skin: moist, warm, no rashes Neurological: no tremor with outstretched hands, DTR normal in all 4   Recent Results (from the past 2160 hour(s))  Comprehensive metabolic panel     Status: Abnormal   Collection Time: 05/24/18 11:13 AM  Result Value Ref Range   Glucose, Bld 104 (H) 65 - 99 mg/dL    Comment: .            Fasting reference interval . For someone without known diabetes, a glucose value between 100 and 125 mg/dL is consistent with prediabetes and should be confirmed with a follow-up test. .    BUN 18 7  - 25 mg/dL   Creat 1.02 0.60 - 1.35 mg/dL   BUN/Creatinine Ratio NOT APPLICABLE 6 - 22 (calc)   Sodium 140 135 - 146 mmol/L   Potassium 4.8 3.5 - 5.3 mmol/L   Chloride 106 98 - 110 mmol/L   CO2 24 20 - 32 mmol/L   Calcium 9.8 8.6 - 10.3 mg/dL   Total Protein 7.3 6.1 - 8.1 g/dL   Albumin 4.2 3.6 - 5.1 g/dL   Globulin 3.1 1.9 - 3.7 g/dL (calc)   AG Ratio 1.4 1.0 - 2.5 (calc)   Total Bilirubin 0.5 0.2 - 1.2 mg/dL   Alkaline phosphatase (APISO) 96 40 - 115 U/L  AST 15 10 - 40 U/L   ALT 27 9 - 46 U/L  TSH     Status: Abnormal   Collection Time: 05/24/18 11:13 AM  Result Value Ref Range   TSH 0.20 (L) 0.40 - 4.50 mIU/L  T4, Free     Status: None   Collection Time: 05/24/18 11:13 AM  Result Value Ref Range   Free T4 1.5 0.8 - 1.8 ng/dL  Hemoglobin A1c     Status: Abnormal   Collection Time: 05/24/18 11:13 AM  Result Value Ref Range   Hgb A1c MFr Bld 6.0 (H) <5.7 % of total Hgb    Comment: For someone without known diabetes, a hemoglobin  A1c value between 5.7% and 6.4% is consistent with prediabetes and should be confirmed with a  follow-up test. . For someone with known diabetes, a value <7% indicates that their diabetes is well controlled. A1c targets should be individualized based on duration of diabetes, age, comorbid conditions, and other considerations. . This assay result is consistent with an increased risk of diabetes. . Currently, no consensus exists regarding use of hemoglobin A1c for diagnosis of diabetes for children. .    Mean Plasma Glucose 126 (calc)   eAG (mmol/L) 7.0 (calc)      Diabetic Labs (most recent): Lab Results  Component Value Date   HGBA1C 6.0 (H) 05/24/2018   HGBA1C 5.7 (H) 03/02/2017   HGBA1C 5.6 06/05/2016     Assessment & Plan:   1. Postsurgical hypothyroidism  -His previsit thyroid function tests are still consistent with appropriate replacement.    -He is advised to continue levothyroxine 225 mcg p.o. daily before  breakfast.     - We discussed about correct intake of levothyroxine, at fasting, with water, separated by at least 30 minutes from breakfast, and separated by more than 4 hours from calcium, iron, multivitamins, acid reflux medications (PPIs). -Patient is made aware of the fact that thyroid hormone replacement is needed for life, dose to be adjusted by periodic monitoring of thyroid function tests.    - TSH target for thyroid cancer patients is between 0.1-0.5.  2. Malignant neoplasm of thyroid gland ([place])  -  03/03/2013:  Status post  near total thyroidectomy for stage 1 multifocal FVPTC. pT1NxMx . 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 - After his surgery, he received  187mCi of I-131 remnant ablation on 05/05/13,  post therapy whole body scar showing no evidence of distant metastasis.  - surveillance ultrasound of the neck/thyroid in February 2016 was negative for thyroid remnants.  - whole-body scan with Thyrogen was negative for tumor recurrence on 07/05/2015.  -Another surveillance  thyroid/neck ultrasound on 06/09/2016 is remarkable for surgically absent thyroid.  -Most recently, on October 04, 2017 he underwent nausea surveillance thyroid-stimulating whole-body scan with negative findings for tumor recurrence or distant metastasis. -His previous labs show thyroglobulin level undetectable (< 2)  -He will be scheduled to have thyroid/neck ultrasound before his next visit in 6 months.    3. Essential hypertension: -His blood pressure is controlled to target.  He is advised to continue lisinopril 10 mg p.o. daily.     4. Prediabetes - For prediabetes, his A1c is higher at 6%.  He is gaining weight due to diet indiscretions.   -  Suggestion is made for him to avoid simple carbohydrates  from his diet including Cakes, Sweet Desserts /  Pastries, Ice Cream, Soda (diet and regular), Sweet Tea, Candies, Chips, Cookies, Store Bought Juices, Alcohol in Excess of  1-2 drinks a day, Artificial Sweeteners, and "Sugar-free" Products. This will help patient to have stable blood glucose profile and potentially avoid unintended weight gain.  -He will be considered for metformin therapy if A1c rises to above 6% by next visit.   - He is extensively counseled for smoking cessation.  5. obesity was BMI 40  - Caloric restriction for weight loss is advised.  See #4 above.   - I advised patient to maintain close follow up with Eustaquio Maize, MD for primary care needs.  - Time spent with the patient: 25 min, of which >50% was spent in reviewing his  current and  previous labs, previous treatments, and medications doses and developing a plan for long-term care.  Leighton Ruff Galluzzo participated in the discussions, expressed understanding, and voiced agreement with the above plans.  All questions were answered to his satisfaction. he is encouraged to contact clinic should he have any questions or concerns prior to his return visit.   Follow up plan: Return in about 6 months (around 12/03/2018) for Follow up with Pre-visit Labs, Follow up with Thyroid Uptake and Scan.  Glade Lloyd, MD Phone: [phone]  Fax: [phone]  This note was partially dictated with voice recognition software. Similar sounding words can be transcribed inadequately or may not  be corrected upon review.  06/03/2018, 11:45 AM

## 2018-06-16 ENCOUNTER — Other Ambulatory Visit: Payer: Self-pay | Admitting: Pediatrics

## 2018-06-16 DIAGNOSIS — M109 Gout, unspecified: Secondary | ICD-10-CM

## 2018-07-11 ENCOUNTER — Ambulatory Visit: Payer: BLUE CROSS/BLUE SHIELD | Admitting: Family

## 2018-07-11 ENCOUNTER — Encounter: Payer: Self-pay | Admitting: Family

## 2018-07-11 VITALS — BP 137/99 | HR 106 | Temp 97.8°F | Ht 70.0 in | Wt 280.0 lb

## 2018-07-11 DIAGNOSIS — J029 Acute pharyngitis, unspecified: Secondary | ICD-10-CM

## 2018-07-11 DIAGNOSIS — M109 Gout, unspecified: Secondary | ICD-10-CM | POA: Diagnosis not present

## 2018-07-11 DIAGNOSIS — J020 Streptococcal pharyngitis: Secondary | ICD-10-CM

## 2018-07-11 LAB — RAPID STREP SCREEN (MED CTR MEBANE ONLY): Strep Gp A Ag, IA W/Reflex: POSITIVE — AB

## 2018-07-11 MED ORDER — AMOXICILLIN 875 MG PO TABS: 875.0000 mg | ORAL_TABLET | Freq: Two times a day (BID) | ORAL | 0 refills | Status: DC

## 2018-07-11 MED ORDER — COLCHICINE 0.6 MG PO TABS: ORAL_TABLET | ORAL | 0 refills | Status: DC

## 2018-07-11 MED ORDER — PREDNISONE 10 MG (21) PO TBPK: ORAL_TABLET | ORAL | 0 refills | Status: DC

## 2018-07-11 MED ORDER — FEBUXOSTAT 40 MG PO TABS: 40.0000 mg | ORAL_TABLET | Freq: Every day | ORAL | 2 refills | Status: DC

## 2018-07-11 MED ORDER — METHYLPREDNISOLONE ACETATE 80 MG/ML IJ SUSP
80.0000 mg | Freq: Once | INTRAMUSCULAR | Status: AC
  Administered 2018-07-11: 80 mg via INTRAMUSCULAR

## 2018-07-11 NOTE — Patient Instructions (Signed)
Gout    Gout is a condition that causes painful swelling of the joints. Gout is a type of inflammation of the joints (arthritis). This condition is caused by having too much uric acid in the body. Uric acid is a chemical that forms when the body breaks down substances called purines. Purines are important for building body proteins.  When the body has too much uric acid, sharp crystals can form and build up inside the joints. This causes pain and swelling. Gout attacks can happen quickly and may be very painful (acute gout). Over time, the attacks can affect more joints and become more frequent (chronic gout). Gout can also cause uric acid to build up under the skin and inside the kidneys.  What are the causes?  This condition is caused by too much uric acid in your blood. This can happen because:   Your kidneys do not remove enough uric acid from your blood. This is the most common cause.   Your body makes too much uric acid. This can happen with some cancers and cancer treatments. It can also occur if your body is breaking down too many red blood cells (hemolytic anemia).   You eat too many foods that are high in purines. These foods include organ meats and some seafood. Alcohol, especially beer, is also high in purines.  A gout attack may be triggered by trauma or stress.  What increases the risk?  You are more likely to develop this condition if you:   Have a family history of gout.   Are male and middle-aged.   Are male and have gone through menopause.   Are obese.   Frequently drink alcohol, especially beer.   Are dehydrated.   Lose weight too quickly.   Have an organ transplant.   Have lead poisoning.   Take certain medicines, including aspirin, cyclosporine, diuretics, levodopa, and niacin.   Have kidney disease.   Have a skin condition called psoriasis.  What are the signs or symptoms?  An attack of acute gout happens quickly. It usually occurs in just one joint. The most common place is  the big toe. Attacks often start at night. Other joints that may be affected include joints of the feet, ankle, knee, fingers, wrist, or elbow. Symptoms of this condition may include:   Severe pain.   Warmth.   Swelling.   Stiffness.   Tenderness. The affected joint may be very painful to touch.   Shiny, red, or purple skin.   Chills and fever.  Chronic gout may cause symptoms more frequently. More joints may be involved. You may also have white or yellow lumps (tophi) on your hands or feet or in other areas near your joints.  How is this diagnosed?  This condition is diagnosed based on your symptoms, medical history, and physical exam. You may have tests, such as:   Blood tests to measure uric acid levels.   Removal of joint fluid with a thin needle (aspiration) to look for uric acid crystals.   X-rays to look for joint damage.  How is this treated?  Treatment for this condition has two phases: treating an acute attack and preventing future attacks. Acute gout treatment may include medicines to reduce pain and swelling, including:   NSAIDs.   Steroids. These are strong anti-inflammatory medicines that can be taken by mouth (orally) or injected into a joint.   Colchicine. This medicine relieves pain and swelling when it is taken soon after an attack. It   can be given by mouth or through an IV.  Preventive treatment may include:   Daily use of smaller doses of NSAIDs or colchicine.   Use of a medicine that reduces uric acid levels in your blood.   Changes to your diet. You may need to see a dietitian about what to eat and drink to prevent gout.  Follow these instructions at home:  During a gout attack     If directed, put ice on the affected area:  ? Put ice in a plastic bag.  ? Place a towel between your skin and the bag.  ? Leave the ice on for 20 minutes, 2-3 times a day.   Raise (elevate) the affected joint above the level of your heart as often as possible.   Rest the joint as much as possible.  If the affected joint is in your leg, you may be given crutches to use.   Follow instructions from your health care provider about eating or drinking restrictions.  Avoiding future gout attacks   Follow a low-purine diet as told by your dietitian or health care provider. Avoid foods and drinks that are high in purines, including liver, kidney, anchovies, asparagus, herring, mushrooms, mussels, and beer.   Maintain a healthy weight or lose weight if you are overweight. If you want to lose weight, talk with your health care provider. It is important that you do not lose weight too quickly.   Start or maintain an exercise program as told by your health care provider.  Eating and drinking   Drink enough fluids to keep your urine pale yellow.   If you drink alcohol:  ? Limit how much you use to:   0-1 drink a day for women.   0-2 drinks a day for men.  ? Be aware of how much alcohol is in your drink. In the [place] one drink equals one 12 oz bottle of beer (355 mL) one 5 oz glass of wine (148 mL), or one 1 oz glass of hard liquor (44 mL).  General instructions   Take over-the-counter and prescription medicines only as told by your health care provider.   Do not drive or use heavy machinery while taking prescription pain medicine.   Return to your normal activities as told by your health care provider. Ask your health care provider what activities are safe for you.   Keep all follow-up visits as told by your health care provider. This is important.  Contact a health care provider if you have:   Another gout attack.   Continuing symptoms of a gout attack after 10 days of treatment.   Side effects from your medicines.   Chills or a fever.   Burning pain when you urinate.   Pain in your lower back or belly.  Get help right away if you:   Have severe or uncontrolled pain.   Cannot urinate.  Summary   Gout is painful swelling of the joints caused by inflammation.   The most common site of pain is the big  toe, but it can affect other joints in the body.   Medicines and dietary changes can help to prevent and treat gout attacks.  This information is not intended to replace advice given to you by your health care provider. Make sure you discuss any questions you have with your health care provider.  Document Released: 06/05/2000 Document Revised: 12/29/2017 Document Reviewed: 12/29/2017  Elsevier Interactive Patient Education  2019 [employer]

## 2018-07-11 NOTE — Progress Notes (Signed)
Subjective:    Patient ID: Tyler [name], male    DOB: 10/16/1975, 43 y.o.   MRN: [id number]  Chief Complaint  Patient presents with  . gout in foot  . swollen right knee  . Fever  . Sore Throat    Fever   Associated symptoms include coughing (dry in the morning). Pertinent negatives include no congestion, ear pain or headaches.  Sore Throat   This is a new problem. The current episode started in the past 7 days. The problem has been gradually worsening. The maximum temperature recorded prior to his arrival was 100.4 - 100.9 F. The pain is at a severity of 10/10. The pain is moderate. Associated symptoms include coughing (dry in the morning), a plugged ear sensation and trouble swallowing. Pertinent negatives include no congestion, ear discharge, ear pain, headaches or hoarse voice. Associated symptoms comments: Night sweats . He has tried acetaminophen and NSAIDs for the symptoms. The treatment provided mild relief.  Knee Pain   The incident occurred 3 to 5 days ago. Injury mechanism: had gout in right great toe and has been "walking funny" The pain is present in the left foot and right knee. The pain is at a severity of 9/10. The pain is moderate. He reports no foreign bodies present. He has tried non-weight bearing and NSAIDs for the symptoms. The treatment provided no relief.  Gout Pt states he has had 6-8 gout flare ups in the last 3 months. He has only been taking the Uloric as needed for flare ups. He has colchicine but has not been using it.    Review of Systems  Constitutional: Positive for fever.  HENT: Positive for trouble swallowing. Negative for congestion, ear discharge, ear pain and hoarse voice.   Respiratory: Positive for cough (dry in the morning).   Neurological: Negative for headaches.  All other systems reviewed and are negative.      Objective:   Physical Exam Vitals signs reviewed.  Constitutional:      General: He is not in acute distress.  Appearance: He is well-developed.  HENT:     Right Ear: External ear normal.     Left Ear: External ear normal.     Mouth/Throat:     Pharynx: Posterior oropharyngeal erythema present.  Eyes:     General:        Right eye: No discharge.        Left eye: No discharge.     Pupils: Pupils are equal, round, and reactive to light.  Neck:     Musculoskeletal: Normal range of motion and neck supple.     Thyroid: No thyromegaly.  Cardiovascular:     Rate and Rhythm: Normal rate and regular rhythm.     Heart sounds: Normal heart sounds. No murmur.  Pulmonary:     Effort: Pulmonary effort is normal. No respiratory distress.     Breath sounds: Normal breath sounds. No wheezing.  Abdominal:     General: Bowel sounds are normal. There is no distension.     Palpations: Abdomen is soft.     Tenderness: There is no abdominal tenderness.  Musculoskeletal:        General: Swelling and tenderness present.     Comments: Tenderness in right great toe, base of foot, and lateral knee. Pain in right knee with flexion or extension. Mild swelling.   Skin:    General: Skin is warm and dry.     Findings: No erythema or rash.  Neurological:  Mental Status: He is alert and oriented to person, place, and time.     Cranial Nerves: No cranial nerve deficit.     Deep Tendon Reflexes: Reflexes are normal and symmetric.  Psychiatric:        Behavior: Behavior normal.        Thought Content: Thought content normal.        Judgment: Judgment normal.       BP (!) 137/99   Pulse (!) 106   Temp 97.8 F (36.6 C) (Oral)   Ht 5\' 10"  (1.778 m)   Wt 280 lb (127 kg)   BMI 40.18 kg/m      Assessment & Plan:  MERRITT MCCRAVY comes in today with chief complaint of gout in foot; swollen right knee; Fever; and Sore Throat   Diagnosis and orders addressed:  1. Sore throat - Rapid Strep Screen ([facility] ONLY)  2. Gout, unspecified cause, unspecified chronicity, unspecified site Low purine diet Long  discussion with patient that he should start taking Uloric daily and colchicine as needed. He had this reversed. Force fluids - colchicine 0.6 MG tablet; Take 1.2 mg then 0.6 mg. For a max of 1.8 mg/24 hour  Dispense: 90 tablet; Refill: 0 - febuxostat (ULORIC) 40 MG tablet; Take 1 tablet (40 mg total) by mouth daily.  Dispense: 90 tablet; Refill: 2 - methylPREDNISolone acetate (DEPO-MEDROL) injection 80 mg - predniSONE (STERAPRED UNI-PAK 21 TAB) 10 MG (21) TBPK tablet; Use as directed  Dispense: 21 tablet; Refill: 0  3. Strep throat - Take meds as prescribed - Use a cool mist humidifier  -Use saline nose sprays frequently -Force fluids -For any cough or congestion  Use plain Mucinex- regular strength or max strength is fine -For fever or aces or pains- take tylenol or ibuprofen. -Throat lozenges if help -New toothbrush in 3 days - amoxicillin (AMOXIL) 875 MG tablet; Take 1 tablet (875 mg total) by mouth 2 (two) times daily.  Dispense: 14 tablet; Refill: 0    Evelina Dun, FNP

## 2018-11-17 ENCOUNTER — Encounter: Payer: Self-pay | Admitting: "Endocrinology

## 2018-12-06 ENCOUNTER — Ambulatory Visit (HOSPITAL_COMMUNITY): Payer: BC Managed Care – PPO

## 2018-12-07 ENCOUNTER — Other Ambulatory Visit: Payer: Self-pay | Admitting: *Deleted

## 2018-12-07 DIAGNOSIS — I10 Essential (primary) hypertension: Secondary | ICD-10-CM

## 2018-12-07 MED ORDER — LISINOPRIL 10 MG PO TABS: 10.0000 mg | ORAL_TABLET | Freq: Every day | ORAL | 0 refills | Status: DC

## 2018-12-09 ENCOUNTER — Ambulatory Visit: Payer: BLUE CROSS/BLUE SHIELD | Admitting: "Endocrinology

## 2018-12-30 ENCOUNTER — Encounter: Payer: Self-pay | Admitting: Family Medicine

## 2018-12-30 ENCOUNTER — Other Ambulatory Visit: Payer: Self-pay

## 2018-12-30 ENCOUNTER — Ambulatory Visit (INDEPENDENT_AMBULATORY_CARE_PROVIDER_SITE_OTHER): Payer: Self-pay | Admitting: Family Medicine

## 2018-12-30 DIAGNOSIS — B001 Herpesviral vesicular dermatitis: Secondary | ICD-10-CM

## 2018-12-30 DIAGNOSIS — R590 Localized enlarged lymph nodes: Secondary | ICD-10-CM

## 2018-12-30 MED ORDER — AMOXICILLIN 875 MG PO TABS: 875.0000 mg | ORAL_TABLET | Freq: Two times a day (BID) | ORAL | 0 refills | Status: DC

## 2018-12-30 MED ORDER — VALACYCLOVIR HCL 1 G PO TABS: 2000.0000 mg | ORAL_TABLET | Freq: Two times a day (BID) | ORAL | 0 refills | Status: AC

## 2018-12-30 NOTE — Progress Notes (Signed)
Virtual Visit via telephone Note Due to COVID-19, visit is conducted virtually and was requested by patient. This visit type was conducted due to national recommendations for restrictions regarding the COVID-19 Pandemic (e.g. social distancing) in an effort to limit this patient's exposure and mitigate transmission in our community. All issues noted in this document were discussed and addressed.  A physical exam was not performed with this format.   I connected with Tyler [name] on 12/30/18 at 1150 by telephone and verified that I am speaking with the correct person using two identifiers. Tyler [name] is currently located at home and family is currently with them during visit. The provider, Monia Pouch, FNP is located in their office at time of visit.  I discussed the limitations, risks, security and privacy concerns of performing an evaluation and management service by telephone and the availability of in person appointments. I also discussed with the patient that there may be a patient responsible charge related to this service. The patient expressed understanding and agreed to proceed.  Subjective:  Patient ID: Tyler [name], male    DOB: February 24, 1976, 43 y.o.   MRN: [id number]  Chief Complaint:  Mouth Lesions and Lymphadenopathy (right anterior cervical)   HPI: EL PILE is a 43 y.o. male presenting on 12/30/2018 for Mouth Lesions and Lymphadenopathy (right anterior cervical)   Pt reports several fever blisters to his lower and upper lips. States this started several days ago after working outside in the sun all day. States he has had this in the past but not this severe.  Pt also complains of lymph node swelling on the right side of his neck below his ear. He states this started a few days ago. States the size changes. States today it is barely noticeable. No fever, chills, tenderness, fatigue, weight loss, or pain.   Mouth Lesions  The current episode started 5 to 7 days ago.  The onset was gradual. The problem has been gradually worsening. Nothing relieves the symptoms. Associated symptoms include mouth sores and swollen glands. Pertinent negatives include no orthopnea, no fever, no decreased vision, no double vision, no eye itching, no photophobia, no abdominal pain, no nausea, no vomiting, no congestion, no ear discharge, no ear pain, no headaches, no hearing loss, no rhinorrhea, no sore throat, no stridor, no muscle aches, no neck pain, no neck stiffness, no cough, no URI, no wheezing, no rash, no eye discharge, no eye pain and no eye redness. He has been eating and drinking normally. Urine output has been normal. There were no sick contacts. He has received no recent medical care.     Relevant past medical, surgical, family, and social history reviewed and updated as indicated.  Allergies and medications reviewed and updated.   Past Medical History:  Diagnosis Date  . Cancer ([place])    thyroid  . Gout   . Gout   . High cholesterol   . Hypertension   . Thyroid disease    thyroid cancer    Past Surgical History:  Procedure Laterality Date  . EYE SURGERY Left    cataract removal  . FRACTURE SURGERY Left 1989   ankle  . THYROIDECTOMY N/A 03/03/2013   Procedure: TOTAL THYROIDECTOMY;  Surgeon: Jamesetta So, MD;  Location: [facility];  Service: General;  Laterality: N/A;    Social History   Socioeconomic History  . Marital status: Single    Spouse name: Not on file  . Number of children: Not on file  .  Years of education: Not on file  . Highest education level: Not on file  Occupational History  . Not on file  Social Needs  . Financial resource strain: Not on file  . Food insecurity    Worry: Not on file    Inability: Not on file  . Transportation needs    Medical: Not on file    Non-medical: Not on file  Tobacco Use  . Smoking status: Former Smoker    Packs/day: 0.50    Years: 10.00    Pack years: 5.00    Types: Cigarettes    Quit date:  11/27/2016    Years since quitting: 2.0  . Smokeless tobacco: Former Network engineer and Sexual Activity  . Alcohol use: Yes    Comment: occasional  . Drug use: No  . Sexual activity: Yes    Birth control/protection: None  Lifestyle  . Physical activity    Days per week: Not on file    Minutes per session: Not on file  . Stress: Not on file  Relationships  . Social Herbalist on phone: Not on file    Gets together: Not on file    Attends religious service: Not on file    Active member of club or organization: Not on file    Attends meetings of clubs or organizations: Not on file    Relationship status: Not on file  . Intimate partner violence    Fear of current or ex partner: Not on file    Emotionally abused: Not on file    Physically abused: Not on file    Forced sexual activity: Not on file  Other Topics Concern  . Not on file  Social History Narrative  . Not on file    Outpatient Encounter Medications as of 12/30/2018  Medication Sig  . amoxicillin (AMOXIL) 875 MG tablet Take 1 tablet (875 mg total) by mouth 2 (two) times daily. 1 po BID  . colchicine 0.6 MG tablet Take 1.2 mg then 0.6 mg. For a max of 1.8 mg/24 hour  . febuxostat (ULORIC) 40 MG tablet Take 1 tablet (40 mg total) by mouth daily.  Marland Kitchen levothyroxine (SYNTHROID, LEVOTHROID) 200 MCG tablet Take 1 tablet (200 mcg total) by mouth every morning.  Marland Kitchen levothyroxine (SYNTHROID, LEVOTHROID) 25 MCG tablet Take 1 tablet (25 mcg total) by mouth daily before breakfast.  . lisinopril (ZESTRIL) 10 MG tablet Take 1 tablet (10 mg total) by mouth daily.  . naproxen (NAPROSYN) 500 MG tablet TAKE 1 TABLET BY MOUTH 2 (TWO) TIMES DAILY WITH A MEAL. (AS NEEDED FOR PAIN)  . predniSONE (STERAPRED UNI-PAK 21 TAB) 10 MG (21) TBPK tablet Use as directed  . valACYclovir (VALTREX) 1000 MG tablet Take 2 tablets (2,000 mg total) by mouth 2 (two) times daily for 1 day. May repeat in 2 days if still symptomatic  . [DISCONTINUED]  amoxicillin (AMOXIL) 875 MG tablet Take 1 tablet (875 mg total) by mouth 2 (two) times daily.   No facility-administered encounter medications on file as of 12/30/2018.     No Known Allergies  Review of Systems  Constitutional: Negative for activity change, appetite change, chills, fatigue, fever and unexpected weight change.  HENT: Positive for mouth sores. Negative for congestion, dental problem, drooling, ear discharge, ear pain, facial swelling, hearing loss, nosebleeds, postnasal drip, rhinorrhea, sinus pressure, sinus pain, sneezing, sore throat, tinnitus, trouble swallowing and voice change.   Eyes: Negative for double vision, photophobia, pain, discharge, redness  and itching.  Respiratory: Negative for cough, chest tightness, shortness of breath, wheezing and stridor.   Cardiovascular: Negative for orthopnea.  Gastrointestinal: Negative for abdominal pain, nausea and vomiting.  Genitourinary: Negative for decreased urine volume and difficulty urinating.  Musculoskeletal: Negative for arthralgias, joint swelling, myalgias and neck pain.  Skin: Negative for rash.  Neurological: Negative for dizziness, weakness, light-headedness and headaches.  Hematological: Positive for adenopathy. Does not bruise/bleed easily.  Psychiatric/Behavioral: Negative for confusion.  All other systems reviewed and are negative.        Observations/Objective: No vital signs or physical exam, this was a telephone or virtual health encounter.  Pt alert and oriented, answers all questions appropriately, and able to speak in full sentences.    Assessment and Plan: Bransyn was seen today for mouth lesions and lymphadenopathy.  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 Will treat with below. Can repeat in 2 days if symptoms have not resolved. Report any new or worsening symptoms. Use sunscreen lip protectant when working outside.  -      valACYclovir (VALTREX) 1000 MG tablet; Take 2 tablets (2,000 mg total) by mouth 2 (two) times daily for 1 day. May repeat in 2 days if still symptomatic  Lymphadenopathy, cervical Reported symptoms consistent with right anterior cervical lymphadenopathy. No red flag symptoms. Will treat empirically with amoxil for 10 days. Pt aware if persistent he will need evaluation and additional testing.  -     amoxicillin (AMOXIL) 875 MG tablet; Take 1 tablet (875 mg total) by mouth 2 (two) times daily. 1 po BID     Follow Up Instructions: Return in about 2 weeks (around 01/13/2019), or if symptoms worsen or fail to improve, for lymphadenopathy.    I discussed the assessment and treatment plan with the patient. The patient was provided an opportunity to ask questions and all were answered. The patient agreed with the plan and demonstrated an understanding of the instructions.   The patient was advised to call back or seek an in-person evaluation if the symptoms worsen or if the condition fails to improve as anticipated.  The above assessment and management plan was discussed with the patient. The patient verbalized understanding of and has agreed to the management plan. Patient is aware to call the clinic if symptoms persist or worsen. Patient is aware when to return to the clinic for a follow-up visit. Patient educated on when it is appropriate to go to the emergency department.    I provided 15 minutes of non-face-to-face time during this encounter. The call started at 1150. The call ended at 1205. The other time was used for coordination of care.    Monia Pouch, FNP-C [facility] [address] [place], [place] [address] [phone]

## 2019-01-02 ENCOUNTER — Telehealth: Payer: Self-pay

## 2019-01-02 DIAGNOSIS — R739 Hyperglycemia, unspecified: Secondary | ICD-10-CM

## 2019-01-02 DIAGNOSIS — E559 Vitamin D deficiency, unspecified: Secondary | ICD-10-CM

## 2019-01-02 DIAGNOSIS — E890 Postprocedural hypothyroidism: Secondary | ICD-10-CM

## 2019-01-02 NOTE — Telephone Encounter (Signed)
Tyler [name], CMA  

## 2019-01-06 ENCOUNTER — Other Ambulatory Visit: Payer: Self-pay | Admitting: Family

## 2019-01-06 DIAGNOSIS — M109 Gout, unspecified: Secondary | ICD-10-CM

## 2019-01-09 ENCOUNTER — Ambulatory Visit (HOSPITAL_COMMUNITY): Payer: BC Managed Care – PPO | Attending: "Endocrinology

## 2019-01-13 ENCOUNTER — Ambulatory Visit: Payer: Self-pay | Admitting: "Endocrinology

## 2019-01-20 DIAGNOSIS — E890 Postprocedural hypothyroidism: Secondary | ICD-10-CM | POA: Diagnosis not present

## 2019-01-20 DIAGNOSIS — R7303 Prediabetes: Secondary | ICD-10-CM | POA: Diagnosis not present

## 2019-01-20 DIAGNOSIS — R739 Hyperglycemia, unspecified: Secondary | ICD-10-CM | POA: Diagnosis not present

## 2019-01-20 DIAGNOSIS — E559 Vitamin D deficiency, unspecified: Secondary | ICD-10-CM | POA: Diagnosis not present

## 2019-01-23 LAB — VITAMIN D 25 HYDROXY (VIT D DEFICIENCY, FRACTURES): Vit D, 25-Hydroxy: 24 ng/mL — ABNORMAL LOW (ref 30–100)

## 2019-01-23 LAB — HEMOGLOBIN A1C
Hgb A1c MFr Bld: 6.5 % of total Hgb — ABNORMAL HIGH (ref <5.7)
Mean Plasma Glucose: 140 (calc)
eAG (mmol/L): 7.7 (calc)

## 2019-01-23 LAB — T4, FREE: Free T4: 1.5 ng/dL (ref 0.8–1.8)

## 2019-01-23 LAB — THYROGLOBULIN ANTIBODY: Thyroglobulin Ab: <1 IU/mL (ref <=1)

## 2019-01-23 LAB — TSH: TSH: 1.2 mIU/L (ref 0.40–4.50)

## 2019-01-25 ENCOUNTER — Encounter: Payer: Self-pay | Admitting: "Endocrinology

## 2019-01-25 ENCOUNTER — Other Ambulatory Visit: Payer: Self-pay

## 2019-01-25 ENCOUNTER — Ambulatory Visit (INDEPENDENT_AMBULATORY_CARE_PROVIDER_SITE_OTHER): Payer: BC Managed Care – PPO | Admitting: "Endocrinology

## 2019-01-25 DIAGNOSIS — I10 Essential (primary) hypertension: Secondary | ICD-10-CM

## 2019-01-25 DIAGNOSIS — C73 Malignant neoplasm of thyroid gland: Secondary | ICD-10-CM | POA: Diagnosis not present

## 2019-01-25 DIAGNOSIS — E890 Postprocedural hypothyroidism: Secondary | ICD-10-CM

## 2019-01-25 DIAGNOSIS — E119 Type 2 diabetes mellitus without complications: Secondary | ICD-10-CM

## 2019-01-25 MED ORDER — LEVOTHYROXINE SODIUM 25 MCG PO TABS: 25.0000 ug | ORAL_TABLET | Freq: Every day | ORAL | 1 refills | Status: DC

## 2019-01-25 MED ORDER — METFORMIN HCL ER 500 MG PO TB24: 500.0000 mg | ORAL_TABLET | Freq: Every day | ORAL | 3 refills | Status: DC

## 2019-01-25 MED ORDER — LEVOTHYROXINE SODIUM 200 MCG PO TABS: 200.0000 ug | ORAL_TABLET | Freq: Every morning | ORAL | 1 refills | Status: DC

## 2019-01-25 NOTE — Patient Instructions (Signed)

## 2019-01-25 NOTE — Progress Notes (Signed)
01/25/2019                                                    Endocrinology Telehealth Visit Follow up Note -During COVID -19 Pandemic  This visit type was conducted due to national recommendations for restrictions regarding the COVID-19 Pandemic  in an effort to limit this patient's exposure and mitigate transmission of the corona virus.  Due to his co-morbid illnesses, Tyler [name] is at  moderate to high risk for complications without adequate follow up.  This format is felt to be most appropriate for him at this time.  I connected with this patient on 01/25/2019   by telephone and verified that I am speaking with the correct person using two identifiers. Tyler [name], 1976/01/14. he has verbally consented to this visit. All issues noted in this document were discussed and addressed. The format was not optimal for physical exam.    Subjective:    Patient ID: Tyler [name], male    DOB: [birth year], PCP Rakes, Connye Burkitt, FNP   Past Medical History:  Diagnosis Date  . Cancer ([place])    thyroid  . Gout   . Gout   . High cholesterol   . Hypertension   . Thyroid disease    thyroid cancer   Past Surgical History:  Procedure Laterality Date  . EYE SURGERY Left    cataract removal  . FRACTURE SURGERY Left 1989   ankle  . THYROIDECTOMY N/A 03/03/2013   Procedure: TOTAL THYROIDECTOMY;  Surgeon: Jamesetta So, MD;  Location: [facility];  Service: General;  Laterality: N/A;   Social History   Socioeconomic History  . Marital status: Single    Spouse name: Not on file  . Number of children: Not on file  . Years of education: Not on file  . Highest education level: Not on file  Occupational History  . Not on file  Social Needs  . Financial resource strain: Not on file  . Food insecurity    Worry: Not on file    Inability: Not on file  . Transportation needs    Medical: Not on file    Non-medical: Not on file  Tobacco Use  . Smoking status: Former Smoker    Packs/day: 0.50    Years:  10.00    Pack years: 5.00    Types: Cigarettes    Quit date: 11/27/2016    Years since quitting: 2.1  . Smokeless tobacco: Former Network engineer and Sexual Activity  . Alcohol use: Yes    Comment: occasional  . Drug use: No  . Sexual activity: Yes    Birth control/protection: None  Lifestyle  . Physical activity    Days per week: Not on file    Minutes per session: Not on file  . Stress: Not on file  Relationships  . Social Herbalist on phone: Not on file    Gets together: Not on file    Attends religious service: Not on file    Active member of club or organization: Not on file    Attends meetings of clubs or organizations: Not on file    Relationship status: Not on file  Other Topics Concern  . Not on file  Social History Narrative  . Not on file   Outpatient Encounter  Medications as of 01/25/2019  Medication Sig  . amoxicillin (AMOXIL) 875 MG tablet Take 1 tablet (875 mg total) by mouth 2 (two) times daily. 1 po BID  . colchicine (COLCRYS) 0.6 MG tablet TAKE 1.2 MG (2 TABLETS) THEN 0.6 MG (1 TABLET) FOR A MAXIMUM OF 1.8 MG OR 3 TABLETS IN 24 HOURS  . febuxostat (ULORIC) 40 MG tablet Take 1 tablet (40 mg total) by mouth daily.  Marland Kitchen levothyroxine (SYNTHROID) 200 MCG tablet Take 1 tablet (200 mcg total) by mouth every morning.  Marland Kitchen levothyroxine (SYNTHROID) 25 MCG tablet Take 1 tablet (25 mcg total) by mouth daily before breakfast.  . lisinopril (ZESTRIL) 10 MG tablet Take 1 tablet (10 mg total) by mouth daily.  . metFORMIN (GLUCOPHAGE XR) 500 MG 24 hr tablet Take 1 tablet (500 mg total) by mouth daily with breakfast.  . naproxen (NAPROSYN) 500 MG tablet TAKE 1 TABLET BY MOUTH 2 (TWO) TIMES DAILY WITH A MEAL. (AS NEEDED FOR PAIN)  . predniSONE (STERAPRED UNI-PAK 21 TAB) 10 MG (21) TBPK tablet Use as directed  . [DISCONTINUED] levothyroxine (SYNTHROID, LEVOTHROID) 200 MCG tablet Take 1 tablet (200 mcg total) by mouth every morning.  . [DISCONTINUED] levothyroxine  (SYNTHROID, LEVOTHROID) 25 MCG tablet Take 1 tablet (25 mcg total) by mouth daily before breakfast.   No facility-administered encounter medications on file as of 01/25/2019.    ALLERGIES: No Known Allergies VACCINATION STATUS: Immunization History  Administered Date(s) Administered  . Influenza-Unspecified 05/20/2000    HPI  43 yr old male with medical history as above . He underwent total thyroidectomy for thyroid cancer on 03/03/2013 by Dr. Arnoldo Morale at [facility] in [place].   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 no lymph nodes were identified.  He received his first Thyrogen stimulated I -131 thyroid remnant ablation on 05/05/13 same, with no evidence of distant metastasis. -Subsequent Thyrogen Stimulated  whole body scan in January 2017  was reported to be unremarkable for tumor recurrence/distant metastasis.    He is on  levothyroxine 225 mcg po qam. reports compliance,  no new complaints.  He reports better compliance this time. -  thyroid/neck ultrasound  on 07/27/2014 was negative for thyroid remnant. - Another surveillance thyroid/neck ultrasound on 06/09/2016 was negative for thyroid remnant.  -Most recently, on October 04, 2017 he underwent nausea surveillance thyroid-stimulating whole-body scan with negative findings for tumor recurrence or distant metastasis. -He missed his appointment for previsit thyroid/neck surveillance ultrasound. - He also has hypertension, his previsit labs show new diagnosis of type 2 diabetes, previously had prediabetes.   he did comply with BP medication I prescribed for him.   Review of Systems Limited as above.  Objective:    There were no vitals taken for this visit.  Wt Readings from Last 3 Encounters:  07/11/18 280 lb (127 kg)  06/03/18 282 lb (127.9 kg)  02/17/18 274 lb 6.4 oz (124.5 kg)      Recent Results (from the past 2160  hour(s))  Thyroglobulin antibody     Status: None   Collection Time: 01/20/19  2:15 PM  Result Value Ref Range   Thyroglobulin Ab <1 < or = 1 IU/mL  TSH     Status: None   Collection Time: 01/20/19  2:15 PM  Result Value Ref Range   TSH 1.20 0.40 - 4.50 mIU/L  T4, Free     Status: None   Collection Time: 01/20/19  2:15 PM  Result Value Ref Range   Free T4 1.5 0.8 - 1.8 ng/dL  Hemoglobin A1c     Status: Abnormal   Collection Time: 01/20/19  2:15 PM  Result Value Ref Range   Hgb A1c MFr Bld 6.5 (H) <5.7 % of total Hgb    Comment: For someone without known diabetes, a hemoglobin A1c value of 6.5% or greater indicates that they may have  diabetes and this should be confirmed with a follow-up  test. . For someone with known diabetes, a value <7% indicates  that their diabetes is well controlled and a value  greater than or equal to 7% indicates suboptimal  control. A1c targets should be individualized based on  duration of diabetes, age, comorbid conditions, and  other considerations. . Currently, no consensus exists regarding use of hemoglobin A1c for diagnosis of diabetes for children. .    Mean Plasma Glucose 140 (calc)   eAG (mmol/L) 7.7 (calc)  Vitamin D, 25-hydroxy     Status: Abnormal   Collection Time: 01/20/19  2:15 PM  Result Value Ref Range   Vit D, 25-Hydroxy 24 (L) 30 - 100 ng/mL    Comment: Vitamin D Status         25-OH Vitamin D: . Deficiency:                    <20 ng/mL Insufficiency:             20 - 29 ng/mL Optimal:                 > or = 30 ng/mL . For 25-OH Vitamin D testing on patients on  D2-supplementation and patients for whom quantitation  of D2 and D3 fractions is required, the [facility](TM) 25-OH VIT D, (D2,D3), LC/MS/MS is recommended: order  code [phone] (patients >34yrs). See Note 1 . Note 1 . For additional information, please refer to  http://education.[facility].com/faq/FAQ199  (This link is being provided for  informational/ educational purposes only.)       Diabetic Labs (most recent): Lab Results  Component Value Date   HGBA1C 6.5 (H) 01/20/2019   HGBA1C 6.0 (H) 05/24/2018   HGBA1C 5.7 (H) 03/02/2017     Assessment & Plan:   1. Postsurgical hypothyroidism  -His previsit thyroid function tests are still consistent with appropriate replacement.    -He is advised to continue levothyroxine 225 mcg p.o. daily before breakfast.     - We discussed about the correct intake of his thyroid hormone, on empty stomach at fasting, with water, separated by at least 30 minutes from breakfast and other medications,  and separated by more than 4 hours from calcium, iron, multivitamins, acid reflux medications (PPIs). -Patient is made aware of the fact that thyroid hormone replacement is needed for life, dose to be adjusted by periodic monitoring of thyroid function tests.  - TSH target for thyroid cancer patients is between 0.1-0.5.  2. Malignant neoplasm of thyroid gland ([place])  -  03/03/2013:  Status post  near total thyroidectomy for stage 1 multifocal FVPTC. pT1NxMx . 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 - After his surgery, he received  16mCi of I-131 remnant ablation on 05/05/13,  post therapy whole body scar showing no evidence of distant metastasis.  - surveillance ultrasound of the neck/thyroid in February 2016 was negative for thyroid remnants.  - whole-body  scan with Thyrogen was negative for tumor recurrence on 07/05/2015.  -Another surveillance  thyroid/neck ultrasound on 06/09/2016 is remarkable for surgically absent thyroid.  -Most recently, on October 04, 2017 he underwent nausea surveillance thyroid-stimulating whole-body scan with negative findings for tumor recurrence or distant metastasis. -His previous labs show thyroglobulin  level undetectable (< 2)  -He missed his appointment for surveillance thyroid/neck ultrasound, urged to get this study done as soon as possible.  Will be discussed during his next visit if unremarkable.     2.  Type 2 diabetes: New diagnosis   - I had a long discussion with @HIM @ about the progressive nature of diabetes and the pathology behind its complications. -His diabetes is complicated by obesity/sedentary life remains at a high risk for more acute and chronic complications which include CAD, CVA, CKD, retinopathy, and neuropathy. These are all discussed in detail with him.  - I have counseled him on diet management and weight loss, by adopting a carbohydrate restricted/protein rich diet. -He  admits that there is a room for improvement in his food and drink choices. - Suggestion is made for him to avoid simple carbohydrates  from his diet including Cakes, Sweet Desserts, Ice Cream, Soda (diet and regular), Sweet Tea, Candies, Chips, Cookies, Store Bought Juices, Alcohol in Excess of  1-2 drinks a day, Artificial Sweeteners,  Coffee Creamer, and "Sugar-free" Products. This will help patient to have more stable blood glucose profile and potentially avoid unintended weight gain.  - I encouraged him to switch to  unprocessed or minimally processed complex starch and increased protein intake (animal or plant source), fruits, and vegetables.  -He will benefit from early initiation of metformin treatment I discussed and initiated metformin 500 mg ER daily after breakfast.  - He will be considered for incretin therapy as appropriate next visit.  - Patient specific target  A1c;  LDL, HDL, Triglycerides, and  Waist Circumference were discussed in detail.   3. Essential hypertension: . he is advised to home monitor blood pressure and report if > 140/90 on 2 separate readings.  He is advised to continue lisinopril 10 mg p.o. daily.     - He is extensively counseled for smoking  cessation.  4. obesity was BMI 40  - Caloric restriction for weight loss is advised.  See #2 above.   - I advised patient to maintain close follow up with Rakes, Connye Burkitt, FNP for primary care needs.  - Patient Care Time Today:  25 min, of which >50% was spent in  counseling and the rest reviewing his  current and  previous labs/studies, previous treatments, his blood glucose readings, and medications' doses and developing a plan for long-term care based on the latest recommendations for standards of care.   Tyler [name] participated in the discussions, expressed understanding, and voiced agreement with the above plans.  All questions were answered to his satisfaction. he is encouraged to contact clinic should he have any questions or concerns prior to his return visit.   Follow up plan: Return in about 3 months (around 04/27/2019) for Follow up with Pre-visit Labs, Thyroid / Neck Ultrasound.  Glade Lloyd, MD Phone: [phone]  Fax: [phone]  This note was partially dictated with voice recognition software. Similar sounding words can be transcribed inadequately or may not  be corrected upon review.  01/25/2019, 5:00 PM

## 2019-02-10 DIAGNOSIS — M79644 Pain in right finger(s): Secondary | ICD-10-CM | POA: Diagnosis not present

## 2019-02-10 DIAGNOSIS — M79645 Pain in left finger(s): Secondary | ICD-10-CM | POA: Insufficient documentation

## 2019-02-10 DIAGNOSIS — M25531 Pain in right wrist: Secondary | ICD-10-CM | POA: Diagnosis not present

## 2019-03-17 ENCOUNTER — Encounter: Payer: Self-pay | Admitting: Family Medicine

## 2019-03-17 ENCOUNTER — Ambulatory Visit (INDEPENDENT_AMBULATORY_CARE_PROVIDER_SITE_OTHER): Payer: BC Managed Care – PPO | Admitting: Family Medicine

## 2019-03-17 DIAGNOSIS — M25531 Pain in right wrist: Secondary | ICD-10-CM

## 2019-03-17 MED ORDER — PREDNISONE 10 MG (21) PO TBPK: ORAL_TABLET | ORAL | 0 refills | Status: DC

## 2019-03-17 NOTE — Progress Notes (Signed)
Virtual Visit via Telephone Note  I connected with Tyler [name] on 03/17/19 at 12:54 PM by telephone and verified that I am speaking with the correct person using two identifiers. Tyler [name] is currently located at home and his fiance is currently with him during this visit which he is okay with. The provider, [name], FNP is located in their home at time of visit.  I discussed the limitations, risks, security and privacy concerns of performing an evaluation and management service by telephone and the availability of in person appointments. I also discussed with the patient that there may be a patient responsible charge related to this service. The patient expressed understanding and agreed to proceed.  Subjective: PCP: Baruch Gouty, FNP  Chief Complaint  Patient presents with  . Wrist Pain   Patient reports right wrist pain that has flared up. He has seen an orthopedic and was referred to a wrist specialist, who he is scheduled to see on 04/05/2019. He has been given prednisone twice since this started and each time it calms it down. He has been taking Ibuprofen 600-800 every 6-8 hours for the pain. He stomach does get upset with the higher dosage. He is requesting a refill on the prednisone and pain medication to get him to his appointment next month.    ROS: Per HPI  Current Outpatient Medications:  .  colchicine (COLCRYS) 0.6 MG tablet, TAKE 1.2 MG (2 TABLETS) THEN 0.6 MG (1 TABLET) FOR A MAXIMUM OF 1.8 MG OR 3 TABLETS IN 24 HOURS, Disp: 90 tablet, Rfl: 0 .  febuxostat (ULORIC) 40 MG tablet, Take 1 tablet (40 mg total) by mouth daily., Disp: 90 tablet, Rfl: 2 .  levothyroxine (SYNTHROID) 200 MCG tablet, Take 1 tablet (200 mcg total) by mouth every morning., Disp: 90 tablet, Rfl: 1 .  levothyroxine (SYNTHROID) 25 MCG tablet, Take 1 tablet (25 mcg total) by mouth daily before breakfast., Disp: 90 tablet, Rfl: 1 .  lisinopril (ZESTRIL) 10 MG tablet, Take 1 tablet (10 mg  total) by mouth daily., Disp: 30 tablet, Rfl: 0 .  metFORMIN (GLUCOPHAGE XR) 500 MG 24 hr tablet, Take 1 tablet (500 mg total) by mouth daily with breakfast., Disp: 30 tablet, Rfl: 3 .  naproxen (NAPROSYN) 500 MG tablet, TAKE 1 TABLET BY MOUTH 2 (TWO) TIMES DAILY WITH A MEAL. (AS NEEDED FOR PAIN), Disp: 180 tablet, Rfl: 0 .  predniSONE (STERAPRED UNI-PAK 21 TAB) 10 MG (21) TBPK tablet, Use as directed on back of pill pack, Disp: 21 tablet, Rfl: 0  No Known Allergies Past Medical History:  Diagnosis Date  . Cancer ([place])    thyroid  . Gout   . High cholesterol   . Hypertension   . Thyroid disease    thyroid cancer    Observations/Objective: A&O  No respiratory distress or wheezing audible over the phone Mood, judgement, and thought processes all WNL  Assessment and Plan: 1. Right wrist pain - Patient to keep appointment next month with Dr. Amedeo Plenty. Prednisone taper sent today. Encouraged to try decreasing Ibuprofen to 400 or 600 mg due to upset stomach and take Tylenol 500 mg at the same time to help the pain. No narcotics are being given today.  - predniSONE (STERAPRED UNI-PAK 21 TAB) 10 MG (21) TBPK tablet; Use as directed on back of pill pack  Dispense: 21 tablet; Refill: 0   Follow Up Instructions:  I discussed the assessment and treatment plan with the patient. The  patient was provided an opportunity to ask questions and all were answered. The patient agreed with the plan and demonstrated an understanding of the instructions.   The patient was advised to call back or seek an in-person evaluation if the symptoms worsen or if the condition fails to improve as anticipated.  The above assessment and management plan was discussed with the patient. The patient verbalized understanding of and has agreed to the management plan. Patient is aware to call the clinic if symptoms persist or worsen. Patient is aware when to return to the clinic for a follow-up visit. Patient educated on when it  is appropriate to go to the emergency department.   Time call ended: 1:08 PM  I provided 18 minutes of non-face-to-face time during this encounter.  Hendricks Limes, MSN, APRN, FNP-C [facility] 03/17/19

## 2019-03-22 ENCOUNTER — Other Ambulatory Visit: Payer: Self-pay | Admitting: "Endocrinology

## 2019-03-22 DIAGNOSIS — C73 Malignant neoplasm of thyroid gland: Secondary | ICD-10-CM

## 2019-04-05 DIAGNOSIS — M79644 Pain in right finger(s): Secondary | ICD-10-CM | POA: Diagnosis not present

## 2019-04-05 DIAGNOSIS — M13839 Other specified arthritis, unspecified wrist: Secondary | ICD-10-CM | POA: Diagnosis not present

## 2019-04-05 DIAGNOSIS — M25531 Pain in right wrist: Secondary | ICD-10-CM | POA: Diagnosis not present

## 2019-04-07 ENCOUNTER — Ambulatory Visit (HOSPITAL_COMMUNITY)
Admission: RE | Admit: 2019-04-07 | Discharge: 2019-04-07 | Disposition: A | Discharge status: Home / Self Care | Payer: BC Managed Care – PPO | Source: Ambulatory Visit | Attending: "Endocrinology | Admitting: "Endocrinology

## 2019-04-07 ENCOUNTER — Other Ambulatory Visit: Payer: Self-pay

## 2019-04-07 DIAGNOSIS — Z85850 Personal history of malignant neoplasm of thyroid: Secondary | ICD-10-CM | POA: Diagnosis not present

## 2019-04-07 DIAGNOSIS — C73 Malignant neoplasm of thyroid gland: Secondary | ICD-10-CM | POA: Diagnosis not present

## 2019-04-20 ENCOUNTER — Other Ambulatory Visit: Payer: Self-pay | Admitting: Family Medicine

## 2019-04-20 DIAGNOSIS — M109 Gout, unspecified: Secondary | ICD-10-CM

## 2019-05-03 ENCOUNTER — Encounter: Payer: Self-pay | Admitting: "Endocrinology

## 2019-05-03 ENCOUNTER — Ambulatory Visit (INDEPENDENT_AMBULATORY_CARE_PROVIDER_SITE_OTHER): Payer: BC Managed Care – PPO | Admitting: "Endocrinology

## 2019-05-03 DIAGNOSIS — E119 Type 2 diabetes mellitus without complications: Secondary | ICD-10-CM

## 2019-05-03 DIAGNOSIS — C73 Malignant neoplasm of thyroid gland: Secondary | ICD-10-CM

## 2019-05-03 DIAGNOSIS — I10 Essential (primary) hypertension: Secondary | ICD-10-CM | POA: Diagnosis not present

## 2019-05-03 DIAGNOSIS — E890 Postprocedural hypothyroidism: Secondary | ICD-10-CM | POA: Diagnosis not present

## 2019-05-03 NOTE — Progress Notes (Signed)
05/03/2019                                 Endocrinology Telehealth Visit Follow up Note -During COVID -19 Pandemic  I connected with Tyler [name] on 05/03/2019   by telephone and verified that I am speaking with the correct person using two identifiers. Tyler [name], 07/20/1975. he has verbally consented to this visit. All issues noted in this document were discussed and addressed. The format was not optimal for physical exam.   Subjective:    Patient ID: Tyler [name], male    DOB: 08-05-1975, PCP Rakes, Connye Burkitt, FNP   Past Medical History:  Diagnosis Date  . Cancer ([place])    thyroid  . Gout   . High cholesterol   . Hypertension   . Thyroid disease    thyroid cancer   Past Surgical History:  Procedure Laterality Date  . EYE SURGERY Left    cataract removal  . FRACTURE SURGERY Left 1989   ankle  . THYROIDECTOMY N/A 03/03/2013   Procedure: TOTAL THYROIDECTOMY;  Surgeon: Jamesetta So, MD;  Location: [facility];  Service: General;  Laterality: N/A;   Social History   Socioeconomic History  . Marital status: Single    Spouse name: Not on file  . Number of children: Not on file  . Years of education: Not on file  . Highest education level: Not on file  Occupational History  . Not on file  Social Needs  . Financial resource strain: Not on file  . Food insecurity    Worry: Not on file    Inability: Not on file  . Transportation needs    Medical: Not on file    Non-medical: Not on file  Tobacco Use  . Smoking status: Former Smoker    Packs/day: 0.50    Years: 10.00    Pack years: 5.00    Types: Cigarettes    Quit date: 11/27/2016    Years since quitting: 2.4  . Smokeless tobacco: Former Network engineer and Sexual Activity  . Alcohol use: Yes    Comment: occasional  . Drug use: No  . Sexual activity: Yes    Birth control/protection: None  Lifestyle  . Physical activity    Days per week: Not on file    Minutes per session: Not on file  . Stress: Not on file   Relationships  . Social Herbalist on phone: Not on file    Gets together: Not on file    Attends religious service: Not on file    Active member of club or organization: Not on file    Attends meetings of clubs or organizations: Not on file    Relationship status: Not on file  Other Topics Concern  . Not on file  Social History Narrative  . Not on file   Outpatient Encounter Medications as of 05/03/2019  Medication Sig  . colchicine (COLCRYS) 0.6 MG tablet TAKE 1.2 MG (2 TABLETS) THEN 0.6 MG (1 TABLET) FOR A MAXIMUM OF 1.8 MG OR 3 TABLETS IN 24 HOURS  . febuxostat (ULORIC) 40 MG tablet Take 1 tablet (40 mg total) by mouth daily.  Marland Kitchen levothyroxine (SYNTHROID) 200 MCG tablet Take 1 tablet (200 mcg total) by mouth every morning.  Marland Kitchen levothyroxine (SYNTHROID) 25 MCG tablet Take 1 tablet (25 mcg total) by mouth daily before breakfast.  . lisinopril (ZESTRIL)  10 MG tablet Take 1 tablet (10 mg total) by mouth daily.  . metFORMIN (GLUCOPHAGE XR) 500 MG 24 hr tablet Take 1 tablet (500 mg total) by mouth daily with breakfast.  . naproxen (NAPROSYN) 500 MG tablet TAKE 1 TABLET BY MOUTH 2 (TWO) TIMES DAILY WITH A MEAL. (AS NEEDED FOR PAIN)  . predniSONE (STERAPRED UNI-PAK 21 TAB) 10 MG (21) TBPK tablet Use as directed on back of pill pack   No facility-administered encounter medications on file as of 05/03/2019.    ALLERGIES: No Known Allergies VACCINATION STATUS: Immunization History  Administered Date(s) Administered  . Influenza-Unspecified 05/20/2000    HPI  43 yr old male with medical history as above . He underwent total thyroidectomy for thyroid cancer on 03/03/2013 by Dr. Arnoldo Morale at [facility] in [place].   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 no lymph nodes were identified.  He received his first Thyrogen stimulated I -131 thyroid remnant ablation on 05/05/13 same, with  no evidence of distant metastasis. -Subsequent Thyrogen Stimulated  whole body scan in January 2017  was reported to be unremarkable for tumor recurrence/distant metastasis.    He is on  levothyroxine 225 mcg po qam. reports compliance,  no new complaints.  He reports better compliance this time. -  thyroid/neck ultrasound  on 07/27/2014 was negative for thyroid remnant. - Another surveillance thyroid/neck ultrasound on 06/09/2016 was negative for thyroid remnant.  -Most recently, on October 04, 2017 he underwent nausea surveillance thyroid-stimulating whole-body scan with negative findings for tumor recurrence or distant metastasis. -His most recent surveillance thyroid/neck ultrasound on April 07, 2019 was remarkable for surgical absence of thyroid lobes without evidence of  residual or recurrent thyroid tissue or lymphadenopathy.  - He also has hypertension, his previsit labs show new diagnosis of type 2 diabetes, previously had prediabetes.   he did comply with BP medication I prescribed for him.   Review of Systems Limited as above.  Objective:    There were no vitals taken for this visit.  Wt Readings from Last 3 Encounters:  07/11/18 280 lb (127 kg)  06/03/18 282 lb (127.9 kg)  02/17/18 274 lb 6.4 oz (124.5 kg)      No results found for this or any previous visit (from the past 2160 hour(s)).    Diabetic Labs (most recent): Lab Results  Component Value Date   HGBA1C 6.5 (H) 01/20/2019   HGBA1C 6.0 (H) 05/24/2018   HGBA1C 5.7 (H) 03/02/2017     Assessment & Plan:   1. Postsurgical hypothyroidism  -His previsit thyroid function tests are still consistent with appropriate replacement.    -He is advised to continue levothyroxine 225 mcg p.o. daily before breakfast.     - We discussed about the correct intake of his thyroid hormone, on empty stomach at fasting, with water, separated by at least 30 minutes from breakfast and other medications,  and separated by more  than 4 hours from calcium, iron, multivitamins, acid reflux medications (PPIs). -Patient is made aware of the fact that thyroid hormone replacement is needed for life, dose to be adjusted by periodic monitoring of thyroid function tests.  - TSH target for thyroid cancer patients is between 0.1-0.5.  2. Malignant neoplasm of thyroid gland ([place])  -  03/03/2013:  Status post  near total thyroidectomy for stage 1 multifocal FVPTC. pT1NxMx . 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 - After his surgery, he received  120mCi of I-131 remnant ablation on 05/05/13,  post therapy whole body scar showing no evidence of distant metastasis.  - surveillance ultrasound of the neck/thyroid in February 2016 was negative for thyroid remnants.  - whole-body scan with Thyrogen was negative for tumor recurrence on 07/05/2015.  -Another surveillance  thyroid/neck ultrasound on 06/09/2016 is remarkable for surgically absent thyroid.  -Most recently, on October 04, 2017 he underwent nausea surveillance thyroid-stimulating whole-body scan with negative findings for tumor recurrence or distant metastasis. -His previous labs show thyroglobulin level undetectable (< 2) -Previsit surveillance thyroid/neck ultrasound negative for  residual thyroid tissue or recurrence or lymphadenopathy.  -He will be considered for Thyrogen stimulated whole-body scan in 1 to 2 years time.  2.  Type 2 diabetes: New diagnosis   - I had a long discussion with @HIM @ about the progressive nature of diabetes and the pathology behind its complications. -His diabetes is complicated by obesity/sedentary life remains at a high risk for more acute and chronic complications which include CAD, CVA, CKD, retinopathy, and neuropathy. These are all discussed in detail with him.  - I have counseled him on  diet management and weight loss, by adopting a carbohydrate restricted/protein rich diet. -He  admits that there is a room for improvement in his food and drink choices. - Suggestion is made for him to avoid simple carbohydrates  from his diet including Cakes, Sweet Desserts, Ice Cream, Soda (diet and regular), Sweet Tea, Candies, Chips, Cookies, Store Bought Juices, Alcohol in Excess of  1-2 drinks a day, Artificial Sweeteners,  Coffee Creamer, and "Sugar-free" Products. This will help patient to have more stable blood glucose profile and potentially avoid unintended weight gain.  - I encouraged him to switch to  unprocessed or minimally processed complex starch and increased protein intake (animal or plant source), fruits, and vegetables.  -He is advised to continue Metformin 500 mg ER daily after breakfast.  - He will be considered for incretin therapy as appropriate next visit.  - Patient specific target  A1c;  LDL, HDL, Triglycerides, and  Waist Circumference were discussed in detail.   3. Essential hypertension: -he is advised to home monitor blood pressure and report if > 140/90 on 2 separate readings.  He is advised to continue lisinopril 10 mg p.o. daily.    - He is extensively counseled for smoking cessation.  4. obesity was BMI 40  - Caloric restriction for weight loss is advised.  See #2 above.   - I advised patient to maintain close follow up with Rakes, Connye Burkitt, FNP for primary care needs.   Time for this visit: 15 minutes. Tyler [name]  participated in the discussions, expressed understanding, and voiced agreement with the above plans.  All questions were answered to his satisfaction. he is encouraged to contact clinic should he have any questions or concerns prior to his return visit.   Follow up plan: Return for Follow up with Pre-visit Labs, Next Visit A1c in Office.  Glade Lloyd, MD Phone: [phone]  Fax: [phone]  This note was partially dictated with  voice recognition software. Similar sounding words can be transcribed inadequately or may not  be corrected upon review.  05/03/2019, 3:22 PM

## 2019-06-15 ENCOUNTER — Other Ambulatory Visit: Payer: Self-pay | Admitting: Family Medicine

## 2019-06-15 DIAGNOSIS — M109 Gout, unspecified: Secondary | ICD-10-CM

## 2019-06-28 ENCOUNTER — Other Ambulatory Visit: Payer: Self-pay | Admitting: Family Medicine

## 2019-06-28 DIAGNOSIS — I10 Essential (primary) hypertension: Secondary | ICD-10-CM

## 2019-08-14 ENCOUNTER — Other Ambulatory Visit: Payer: Self-pay | Admitting: "Endocrinology

## 2019-08-14 DIAGNOSIS — M109 Gout, unspecified: Secondary | ICD-10-CM

## 2019-08-14 DIAGNOSIS — I10 Essential (primary) hypertension: Secondary | ICD-10-CM

## 2019-08-31 ENCOUNTER — Ambulatory Visit: Payer: BC Managed Care – PPO | Admitting: "Endocrinology

## 2019-09-14 DIAGNOSIS — C73 Malignant neoplasm of thyroid gland: Secondary | ICD-10-CM | POA: Diagnosis not present

## 2019-09-14 DIAGNOSIS — E559 Vitamin D deficiency, unspecified: Secondary | ICD-10-CM | POA: Diagnosis not present

## 2019-09-14 DIAGNOSIS — E119 Type 2 diabetes mellitus without complications: Secondary | ICD-10-CM | POA: Diagnosis not present

## 2019-09-14 DIAGNOSIS — E890 Postprocedural hypothyroidism: Secondary | ICD-10-CM | POA: Diagnosis not present

## 2019-09-15 LAB — COMPLETE METABOLIC PANEL WITH GFR
AG Ratio: 1.5 (calc) (ref 1.0–2.5)
ALT: 26 U/L (ref 9–46)
AST: 16 U/L (ref 10–40)
Albumin: 4.2 g/dL (ref 3.6–5.1)
Alkaline phosphatase (APISO): 96 U/L (ref 36–130)
BUN: 11 mg/dL (ref 7–25)
CO2: 26 mmol/L (ref 20–32)
Calcium: 9.7 mg/dL (ref 8.6–10.3)
Chloride: 106 mmol/L (ref 98–110)
Creat: 1.02 mg/dL (ref 0.60–1.35)
GFR, Est African American: 105 mL/min/{1.73_m2} (ref >=60)
GFR, Est Non African American: 91 mL/min/{1.73_m2} (ref >=60)
Globulin: 2.8 g/dL (calc) (ref 1.9–3.7)
Glucose, Bld: 150 mg/dL — ABNORMAL HIGH (ref 65–99)
Potassium: 4.8 mmol/L (ref 3.5–5.3)
Sodium: 141 mmol/L (ref 135–146)
Total Bilirubin: 0.5 mg/dL (ref 0.2–1.2)
Total Protein: 7 g/dL (ref 6.1–8.1)

## 2019-09-15 LAB — THYROGLOBULIN LEVEL: Thyroglobulin: <0.1 ng/mL — ABNORMAL LOW

## 2019-09-15 LAB — T4, FREE: Free T4: 1.3 ng/dL (ref 0.8–1.8)

## 2019-09-15 LAB — VITAMIN D 25 HYDROXY (VIT D DEFICIENCY, FRACTURES): Vit D, 25-Hydroxy: 15 ng/mL — ABNORMAL LOW (ref 30–100)

## 2019-09-15 LAB — TSH: TSH: 12.48 mIU/L — ABNORMAL HIGH (ref 0.40–4.50)

## 2019-09-29 ENCOUNTER — Encounter: Payer: Self-pay | Admitting: "Endocrinology

## 2019-09-29 ENCOUNTER — Ambulatory Visit: Payer: BC Managed Care – PPO | Admitting: "Endocrinology

## 2019-09-29 ENCOUNTER — Other Ambulatory Visit: Payer: Self-pay

## 2019-09-29 VITALS — BP 120/87 | HR 96 | Ht 70.0 in | Wt 292.8 lb

## 2019-09-29 DIAGNOSIS — E890 Postprocedural hypothyroidism: Secondary | ICD-10-CM | POA: Diagnosis not present

## 2019-09-29 DIAGNOSIS — C73 Malignant neoplasm of thyroid gland: Secondary | ICD-10-CM | POA: Diagnosis not present

## 2019-09-29 DIAGNOSIS — I10 Essential (primary) hypertension: Secondary | ICD-10-CM

## 2019-09-29 DIAGNOSIS — E1165 Type 2 diabetes mellitus with hyperglycemia: Secondary | ICD-10-CM | POA: Insufficient documentation

## 2019-09-29 DIAGNOSIS — E119 Type 2 diabetes mellitus without complications: Secondary | ICD-10-CM | POA: Diagnosis not present

## 2019-09-29 LAB — POCT GLYCOSYLATED HEMOGLOBIN (HGB A1C): Hemoglobin A1C: 7.5 % — AB (ref 4.0–5.6)

## 2019-09-29 MED ORDER — METFORMIN HCL ER (OSM) 1000 MG PO TB24: 1000.0000 mg | ORAL_TABLET | Freq: Every day | ORAL | 1 refills | Status: DC

## 2019-09-29 MED ORDER — LEVOTHYROXINE SODIUM 25 MCG PO TABS: 25.0000 ug | ORAL_TABLET | Freq: Every day | ORAL | 1 refills | Status: DC

## 2019-09-29 MED ORDER — LEVOTHYROXINE SODIUM 200 MCG PO TABS: 200.0000 ug | ORAL_TABLET | Freq: Every morning | ORAL | 1 refills | Status: DC

## 2019-09-29 NOTE — Patient Instructions (Signed)

## 2019-09-29 NOTE — Progress Notes (Signed)
09/29/2019     Endocrinology follow-up note   Subjective:    Patient ID: Tyler [name], male    DOB: 01-Apr-1976, PCP Rakes, Connye Burkitt, FNP   Past Medical History:  Diagnosis Date  . Cancer ([place])    thyroid  . Gout   . High cholesterol   . Hypertension   . Thyroid disease    thyroid cancer   Past Surgical History:  Procedure Laterality Date  . EYE SURGERY Left    cataract removal  . FRACTURE SURGERY Left 1989   ankle  . THYROIDECTOMY N/A 03/03/2013   Procedure: TOTAL THYROIDECTOMY;  Surgeon: Jamesetta So, MD;  Location: [facility];  Service: General;  Laterality: N/A;   Social History   Socioeconomic History  . Marital status: Single    Spouse name: Not on file  . Number of children: Not on file  . Years of education: Not on file  . Highest education level: Not on file  Occupational History  . Not on file  Tobacco Use  . Smoking status: Former Smoker    Packs/day: 0.50    Years: 10.00    Pack years: 5.00    Types: Cigarettes    Quit date: 11/27/2016    Years since quitting: 2.8  . Smokeless tobacco: Former Network engineer and Sexual Activity  . Alcohol use: Yes    Comment: occasional  . Drug use: No  . Sexual activity: Yes    Birth control/protection: None  Other Topics Concern  . Not on file  Social History Narrative  . Not on file   Social Determinants of Health   Financial Resource Strain:   . Difficulty of Paying Living Expenses:   Food Insecurity:   . Worried About Charity fundraiser in the Last Year:   . Arboriculturist in the Last Year:   Transportation Needs:   . Film/video editor (Medical):   Marland Kitchen Lack of Transportation (Non-Medical):   Physical Activity:   . Days of Exercise per Week:   . Minutes of Exercise per Session:   Stress:   . Feeling of Stress :   Social Connections:   . Frequency of Communication with Friends and Family:   . Frequency of Social Gatherings with Friends and Family:   . Attends Religious Services:   . Active  Member of Clubs or Organizations:   . Attends Archivist Meetings:   Marland Kitchen Marital Status:    Outpatient Encounter Medications as of 09/29/2019  Medication Sig  . colchicine (COLCRYS) 0.6 MG tablet TAKE 1.2 MG (2 TABLETS) THEN 0.6 MG (1 TABLET) FOR A MAXIMUM OF 1.8 MG OR 3 TABLETS IN 24 HOURS  . levothyroxine (SYNTHROID) 200 MCG tablet Take 1 tablet (200 mcg total) by mouth every morning. He has a separate Rx for 25 mcg to make a total daily dose of 216mcg.  Marland Kitchen levothyroxine (SYNTHROID) 25 MCG tablet Take 1 tablet (25 mcg total) by mouth daily before breakfast. He has a separate Rx for 200 mcg to make a total daily dose of 231mcg.  Marland Kitchen lisinopril (ZESTRIL) 10 MG tablet TAKE 1 TABLET BY MOUTH EVERY DAY  . metformin (FORTAMET) 1000 MG (OSM) 24 hr tablet Take 1 tablet (1,000 mg total) by mouth daily after breakfast.  . [DISCONTINUED] febuxostat (ULORIC) 40 MG tablet Take 1 tablet (40 mg total) by mouth daily.  . [DISCONTINUED] levothyroxine (SYNTHROID) 200 MCG tablet TAKE 1 TABLET BY MOUTH EVERY MORNING  . [DISCONTINUED]  levothyroxine (SYNTHROID) 25 MCG tablet Take 1 tablet (25 mcg total) by mouth daily before breakfast.  . [DISCONTINUED] metFORMIN (GLUCOPHAGE XR) 500 MG 24 hr tablet Take 1 tablet (500 mg total) by mouth daily with breakfast.  . [DISCONTINUED] naproxen (NAPROSYN) 500 MG tablet TAKE 1 TABLET BY MOUTH 2 (TWO) TIMES DAILY WITH A MEAL. (AS NEEDED FOR PAIN)  . [DISCONTINUED] predniSONE (STERAPRED UNI-PAK 21 TAB) 10 MG (21) TBPK tablet Use as directed on back of pill pack   No facility-administered encounter medications on file as of 09/29/2019.   ALLERGIES: No Known Allergies VACCINATION STATUS: Immunization History  Administered Date(s) Administered  . Influenza-Unspecified 05/20/2000    HPI  44 yr old male with medical history as above . He underwent total thyroidectomy for thyroid cancer on 03/03/2013 by Dr. Arnoldo Morale at [facility] in [place].   The  pathologic diagnosis was TNM stage 1 (pT1a, NxMx ) multifocal follicular variant papillary thyroid cancer. no lymph nodes were identified.  He received his first Thyrogen stimulated I -131 thyroid remnant ablation on 05/05/13 same, with no evidence of distant metastasis. -Subsequent Thyrogen Stimulated  whole body scan in January 2017  was reported to be unremarkable for tumor recurrence/distant metastasis.    He is on  levothyroxine 225 mcg po qam. reports compliance,  no new complaints.  He has been forgetting his doses lately and when he remembers takes it in the evening with supper.  His previsit labs are consistent with inadequate replacement.    -  thyroid/neck ultrasound  on 07/27/2014 was negative for thyroid remnant. - Another surveillance thyroid/neck ultrasound on 06/09/2016 was negative for thyroid remnant.  -Most recently, on October 04, 2017 he underwent nausea surveillance thyroid-stimulating whole-body scan with negative findings for tumor recurrence or distant metastasis. -His most recent surveillance thyroid/neck ultrasound on April 07, 2019 was remarkable for surgical absence of thyroid lobes without evidence of  residual or recurrent thyroid tissue or lymphadenopathy.  - He also has hypertension, his previsit labs show new diagnosis of type 2 diabetes. He ran out of his Metformin, his point-of-care A1c today 7.5%, increasing from 6.5% during his last visit.   he did comply with BP medication I prescribed for him.   Review of Systems  Review of systems  Constitutional: + Minimally fluctuating body weight,  current  Body mass index is 42.01 kg/m. , no fatigue, no subjective hyperthermia, no subjective hypothermia Eyes: no blurry vision, no xerophthalmia ENT: no sore throat, no nodules palpated in throat, no dysphagia/odynophagia, no hoarseness Cardiovascular: no Chest Pain, no Shortness of Breath, no palpitations, no leg swelling Respiratory: no cough, no shortness of  breath Gastrointestinal: no Nausea/Vomiting/Diarhhea Musculoskeletal: no muscle/joint aches Skin: no rashes, no hyperemia Neurological: no tremors, no numbness, no tingling, no dizziness Psychiatric: no depression, no anxiety   Objective:    BP 120/87   Pulse 96   Ht 5\' 10"  (1.778 m)   Wt 292 lb 12.8 oz (132.8 kg)   BMI 42.01 kg/m   Wt Readings from Last 3 Encounters:  09/29/19 292 lb 12.8 oz (132.8 kg)  07/11/18 280 lb (127 kg)  06/03/18 282 lb (127.9 kg)     Physical Exam- Limited  Constitutional:  Body mass index is 42.01 kg/m. , not in acute distress, normal state of mind Eyes:  EOMI, no exophthalmos Neck: Supple Thyroid: No gross goiter Respiratory: Adequate breathing efforts Musculoskeletal: no gross deformities, strength intact in all four extremities, no gross restriction of joint movements  Skin:  no rashes, no hyperemia Neurological: no tremor with outstretched hands,    Recent Results (from the past 2160 hour(s))  TSH SOLSTAS     Status: Abnormal   Collection Time: 09/14/19 11:42 AM  Result Value Ref Range   TSH 12.48 (H) 0.40 - 4.50 mIU/L  T4, free SOLSTAS     Status: None   Collection Time: 09/14/19 11:42 AM  Result Value Ref Range   Free T4 1.3 0.8 - 1.8 ng/dL  COMPLETE METABOLIC PANEL WITH GFR     Status: Abnormal   Collection Time: 09/14/19 11:42 AM  Result Value Ref Range   Glucose, Bld 150 (H) 65 - 99 mg/dL    Comment: .            Fasting reference interval . For someone without known diabetes, a glucose value >125 mg/dL indicates that they may have diabetes and this should be confirmed with a follow-up test. .    BUN 11 7 - 25 mg/dL   Creat 1.02 0.60 - 1.35 mg/dL   GFR, Est Non African American 91 > OR = 60 mL/min/1.30m2   GFR, Est African American 105 > OR = 60 mL/min/1.10m2   BUN/Creatinine Ratio NOT APPLICABLE 6 - 22 (calc)   Sodium 141 135 - 146 mmol/L   Potassium 4.8 3.5 - 5.3 mmol/L   Chloride 106 98 - 110 mmol/L   CO2 26 20 -  32 mmol/L   Calcium 9.7 8.6 - 10.3 mg/dL   Total Protein 7.0 6.1 - 8.1 g/dL   Albumin 4.2 3.6 - 5.1 g/dL   Globulin 2.8 1.9 - 3.7 g/dL (calc)   AG Ratio 1.5 1.0 - 2.5 (calc)   Total Bilirubin 0.5 0.2 - 1.2 mg/dL   Alkaline phosphatase (APISO) 96 36 - 130 U/L   AST 16 10 - 40 U/L   ALT 26 9 - 46 U/L  Thyroglobulin Level     Status: Abnormal   Collection Time: 09/14/19 11:42 AM  Result Value Ref Range   Thyroglobulin <0.1 (L) ng/mL    Comment:       Reference Range:       Intact Thyroid   2.8-40.9       Athyrotic        <0.1 .       Note: Abnormal flagging is based       on the reference interval for        patients with intact thyroid. . . This test was performed using the Beckman Coulter  chemiluminescent method. Values obtained from different assay methods cannot be used interchangeably. Thyroglobulin levels, regardless of value, should not be interpreted as absolute evidence of the presence or absence of disease. .    Comment      Comment: . Thyroglobulin antibodies (TGAB) interfere with thyroglobulin (TG) assays; therefore, TGAB assay should always be performed in conjunction with a TG assay. . . For additional information, please refer to  http://education.questdiagnostics.com/faq/FAQ202  (This link is being provided for informational/ educational purposes only.) .   VITAMIN D 25 Hydroxy (Vit-D Deficiency, Fractures)     Status: Abnormal   Collection Time: 09/14/19 11:42 AM  Result Value Ref Range   Vit D, 25-Hydroxy 15 (L) 30 - 100 ng/mL    Comment: Vitamin D Status         25-OH Vitamin D: . Deficiency:                    <20  ng/mL Insufficiency:             20 - 29 ng/mL Optimal:                 > or = 30 ng/mL . For 25-OH Vitamin D testing on patients on  D2-supplementation and patients for whom quantitation  of D2 and D3 fractions is required, the QuestAssureD(TM) 25-OH VIT D, (D2,D3), LC/MS/MS is recommended: order  code [id number] (patients  >60yrs). See Note 1 . Note 1 . For additional information, please refer to  http://education.[facility].com/faq/FAQ199  (This link is being provided for informational/ educational purposes only.)   HgB A1c     Status: Abnormal   Collection Time: 09/29/19  9:11 AM  Result Value Ref Range   Hemoglobin A1C 7.5 (A) 4.0 - 5.6 %   HbA1c POC (<> result, manual entry)     HbA1c, POC (prediabetic range)     HbA1c, POC (controlled diabetic range)        Diabetic Labs (most recent): Lab Results  Component Value Date   HGBA1C 7.5 (A) 09/29/2019   HGBA1C 6.5 (H) 01/20/2019   HGBA1C 6.0 (H) 05/24/2018     Assessment & Plan:   1. Postsurgical hypothyroidism  -His previsit thyroid function tests are still consistent with inadequate replacement, however this is the result of his inconsistency taking his levothyroxine. -He will be continued on the same dose of levothyroxine which is already high dose,  levothyroxine 225 mcg p.o. daily before breakfast.     - We discussed about the correct intake of his thyroid hormone, on empty stomach at fasting, with water, separated by at least 30 minutes from breakfast and other medications,  and separated by more than 4 hours from calcium, iron, multivitamins, acid reflux medications (PPIs). -Patient is made aware of the fact that thyroid hormone replacement is needed for life, dose to be adjusted by periodic monitoring of thyroid function tests.   - TSH target for thyroid cancer patients is between 0.1-0.5.  2. Malignant neoplasm of thyroid gland ([place])  -  03/03/2013:  Status post  near total thyroidectomy for stage 1 multifocal FVPTC. pT1NxMx . 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 - After his surgery, he received  138mCi of I-131 remnant ablation on 05/05/13,  post therapy whole body scar  showing no evidence of distant metastasis.  - surveillance ultrasound of the neck/thyroid in February 2016 was negative for thyroid remnants.  - whole-body scan with Thyrogen was negative for tumor recurrence on 07/05/2015.  -Another surveillance  thyroid/neck ultrasound on 06/09/2016 is remarkable for surgically absent thyroid.  -Most recently, on October 04, 2017 he underwent nausea surveillance thyroid-stimulating whole-body scan with negative findings for tumor recurrence or distant metastasis. -His previous labs show thyroglobulin level undetectable (< 2) -Previsit surveillance thyroid/neck ultrasound negative for  residual thyroid tissue or recurrence or lymphadenopathy.  -He will be considered for Thyrogen stimulated whole-body scan in 1  years time.  2.  Type 2 diabetes: Recent diagnosis, worsening. -He is approached for higher dose of Metformin.  I discussed and reinitiated metformin 1000 g ER daily after breakfast.   - I had a long discussion with him about the progressive nature of diabetes and the pathology behind its complications. -His diabetes is complicated by obesity/sedentary life remains at a high risk for more acute and chronic complications which include CAD, CVA, CKD,  retinopathy, and neuropathy. These are all discussed in detail with him.  - I have counseled him on diet management and weight loss, by adopting a carbohydrate restricted/protein rich diet.  - he  admits there is a room for improvement in his diet and drink choices. -  Suggestion is made for him to avoid simple carbohydrates  from his diet including Cakes, Sweet Desserts / Pastries, Ice Cream, Soda (diet and regular), Sweet Tea, Candies, Chips, Cookies, Sweet Pastries,  Store Bought Juices, Alcohol in Excess of  1-2 drinks a day, Artificial Sweeteners, Coffee Creamer, and "Sugar-free" Products. This will help patient to have stable blood glucose profile and potentially avoid unintended weight gain.  - I  encouraged him to switch to  unprocessed or minimally processed complex starch and increased protein intake (animal or plant source), fruits, and vegetables.  - He will be considered for incretin therapy as appropriate next visit.  - Patient specific target  A1c;  LDL, HDL, Triglycerides, and  Waist Circumference were discussed in detail.   3. Essential hypertension: -His blood pressure is controlled to target.  He is advised to continue lisinopril 10 mg p.o. daily.   - He has quit smoking in 2018.  He is advised to stay away from smoking.     4. obesity -He is BMI is [id number] complicating his care.  He is a candidate for modest weight loss.  See #2 above.  I discussed with him the fact that loss of 5-10% of body weight will have the most impact on his diabetes, hypertension.    - I advised patient to maintain close follow up with Rakes, Connye Burkitt, FNP for primary care needs.  - Time spent on this patient care encounter:  40 min, of which > 50% was spent in  counseling and the rest  discussing his hypoglycemia and hyperglycemia episodes, reviewing his current and  previous labs / studies  ( including abstraction from other facilities) and medications  doses and developing a  long term treatment plan and documenting his care.   Please refer to Patient Instructions for Blood Glucose Monitoring and Insulin/Medications Dosing Guide"  in media tab for additional information. Please  also refer to " Patient Self Inventory" in the Media  tab for reviewed elements of pertinent patient history.  Leighton Ruff Mcwright participated in the discussions, expressed understanding, and voiced agreement with the above plans.  All questions were answered to his satisfaction. he is encouraged to contact clinic should he have any questions or concerns prior to his return visit.   Follow up plan: Return in about 4 months (around 01/29/2020) for Follow up with Pre-visit Labs, Next Visit A1c in Office.  Glade Lloyd,  MD Phone: [phone]  Fax: [phone]  This note was partially dictated with voice recognition software. Similar sounding words can be transcribed inadequately or may not  be corrected upon review.  09/29/2019, 12:33 PM

## 2019-10-02 ENCOUNTER — Telehealth: Payer: Self-pay

## 2019-10-02 MED ORDER — METFORMIN HCL 1000 MG PO TABS: 1000.0000 mg | ORAL_TABLET | Freq: Two times a day (BID) | ORAL | 0 refills | Status: DC

## 2019-10-02 NOTE — Telephone Encounter (Signed)
Sent a rx for metformin 1000mg  b.i.d. to [facility].

## 2019-10-02 NOTE — Telephone Encounter (Signed)
Please call Mickel Baas regarding Metformin Ex release, as it is not covered under the insurance, do you want to call in something else, or do a Auth on it

## 2019-10-10 ENCOUNTER — Other Ambulatory Visit: Payer: Self-pay | Admitting: "Endocrinology

## 2019-10-10 DIAGNOSIS — M109 Gout, unspecified: Secondary | ICD-10-CM

## 2019-11-30 DIAGNOSIS — E669 Obesity, unspecified: Secondary | ICD-10-CM | POA: Diagnosis not present

## 2019-11-30 DIAGNOSIS — M25561 Pain in right knee: Secondary | ICD-10-CM | POA: Diagnosis not present

## 2019-11-30 DIAGNOSIS — M170 Bilateral primary osteoarthritis of knee: Secondary | ICD-10-CM | POA: Diagnosis not present

## 2020-01-10 DIAGNOSIS — I10 Essential (primary) hypertension: Secondary | ICD-10-CM | POA: Diagnosis not present

## 2020-01-10 DIAGNOSIS — B356 Tinea cruris: Secondary | ICD-10-CM | POA: Diagnosis not present

## 2020-01-10 DIAGNOSIS — Z6841 Body Mass Index (BMI) 40.0 and over, adult: Secondary | ICD-10-CM | POA: Diagnosis not present

## 2020-01-11 ENCOUNTER — Encounter: Payer: Self-pay | Admitting: Family Medicine

## 2020-01-11 ENCOUNTER — Other Ambulatory Visit: Payer: Self-pay

## 2020-01-11 ENCOUNTER — Ambulatory Visit (INDEPENDENT_AMBULATORY_CARE_PROVIDER_SITE_OTHER): Payer: BC Managed Care – PPO | Admitting: Family Medicine

## 2020-01-11 VITALS — BP 170/112 | HR 65 | Temp 97.5°F | Ht 70.0 in | Wt 293.4 lb

## 2020-01-11 DIAGNOSIS — L02214 Cutaneous abscess of groin: Secondary | ICD-10-CM | POA: Diagnosis not present

## 2020-01-11 DIAGNOSIS — I10 Essential (primary) hypertension: Secondary | ICD-10-CM | POA: Diagnosis not present

## 2020-01-11 MED ORDER — SULFAMETHOXAZOLE-TRIMETHOPRIM 800-160 MG PO TABS: 1.0000 | ORAL_TABLET | Freq: Two times a day (BID) | ORAL | 0 refills | Status: AC

## 2020-01-11 NOTE — Patient Instructions (Signed)
Take 2 Lisinopril for a total of 20 mg daily while you are on the antibiotic. Monitor your BP. When it goes back down, you can decrease Lisinopril back to 10 mg. If it remains elevated, you need to let me know so we can address further.    Skin Abscess  A skin abscess is an infected area of your skin that contains pus and other material. An abscess can happen in any part of your body. Some abscesses break open (rupture) on their own. Most continue to get worse unless they are treated. The infection can spread deeper into the body and into your blood, which can make you feel sick. A skin abscess is caused by germs that enter the skin through a cut or scrape. It can also be caused by blocked oil and sweat glands or infected hair follicles. This condition is usually treated by:  Draining the pus.  Taking antibiotic medicines.  Placing a warm, wet washcloth over the abscess. Follow these instructions at home: Medicines   Take over-the-counter and prescription medicines only as told by your doctor.  If you were prescribed an antibiotic medicine, take it as told by your doctor. Do not stop taking the antibiotic even if you start to feel better. Abscess care   If you have an abscess that has not drained, place a warm, clean, wet washcloth over the abscess several times a day. Do this as told by your doctor.  Follow instructions from your doctor about how to take care of your abscess. Make sure you: ? Cover the abscess with a bandage (dressing). ? Change your bandage or gauze as told by your doctor. ? Wash your hands with soap and water before you change the bandage or gauze. If you cannot use soap and water, use hand sanitizer.  Check your abscess every day for signs that the infection is getting worse. Check for: ? More redness, swelling, or pain. ? More fluid or blood. ? Warmth. ? More pus or a bad smell. General instructions  To avoid spreading the infection: ? Do not share  personal care items, towels, or hot tubs with others. ? Avoid making skin-to-skin contact with other people.  Keep all follow-up visits as told by your doctor. This is important. Contact a doctor if:  You have more redness, swelling, or pain around your abscess.  You have more fluid or blood coming from your abscess.  Your abscess feels warm when you touch it.  You have more pus or a bad smell coming from your abscess.  You have a fever.  Your muscles ache.  You have chills.  You feel sick. Get help right away if:  You have very bad (severe) pain.  You see red streaks on your skin spreading away from the abscess. Summary  A skin abscess is an infected area of your skin that contains pus and other material.  The abscess is caused by germs that enter the skin through a cut or scrape. It can also be caused by blocked oil and sweat glands or infected hair follicles.  Follow your doctor's instructions on caring for your abscess, taking medicines, preventing infections, and keeping follow-up visits. This information is not intended to replace advice given to you by your health care provider. Make sure you discuss any questions you have with your health care provider. Document Revised: 01/12/2019 Document Reviewed: 07/22/2017 Elsevier Patient Education  2020 [employer].

## 2020-01-11 NOTE — Progress Notes (Signed)
Assessment & Plan:  1. Essential hypertension - Encouraged to take an extra lisinopril for a total of 20 mg once daily while he is on antibiotics.  He will continue to monitor his blood pressure and when it comes back down he will decrease back to 10 mg once daily of the lisinopril.  If his blood pressure remains elevated, he is to let me know so that adjustments can be made to his medication regimen.  2. Abscess of left groin - Education provided on abscesses. - sulfamethoxazole-trimethoprim (BACTRIM DS) 800-160 MG tablet; Take 1 tablet by mouth 2 (two) times daily for 10 days.  Dispense: 20 tablet; Refill: 0   Return in about 4 weeks (around 02/08/2020) for HTN.  Hendricks Limes, MSN, APRN, FNP-C [facility]  Subjective:    Patient ID: Tyler [name], male    DOB: 04-03-1976, 44 y.o.   MRN: [id number]  Patient Care Team: Baruch Gouty, FNP as PCP - General (Family Medicine)   Chief Complaint:  Chief Complaint  Patient presents with  . Hypertension    Patient states that his bp has been running high x 3 days.  Patient wenr to urgent care yesterday.    HPI: Tyler [name] is a 44 y.o. male presenting on 01/11/2020 for Hypertension (Patient states that his bp has been running high x 3 days.  Patient wenr to urgent care yesterday.)  Patient reports he started feeling funny two days ago.  He further describes this as dizziness and headaches.  When he checked his blood pressure it was 178/133 so he took an extra lisinopril that night.  Yesterday his blood pressure was 175/133 at urgent care.  He was given clonidine 0.2 mg and his blood pressure came down to 158/108.  He reports the headaches have resolved but he continues to feel dizzy.  Two days prior to the onset of all of this he developed an abscess in his left groin that has been draining.  He reports he checks his blood pressure twice weekly and he is normally 130s/80s, until the abscess appeared.  He denies any  chest pain or shortness of breath.   Social history:  Relevant past medical, surgical, family and social history reviewed and updated as indicated. Interim medical history since our last visit reviewed.  Allergies and medications reviewed and updated.  DATA REVIEWED: CHART IN EPIC  ROS: Negative unless specifically indicated above in HPI.    Current Outpatient Medications:  .  colchicine 0.6 MG tablet, TAKE 1.2 MG (2 TABLETS) THEN 0.6 MG (1 TABLET) FOR A MAXIMUM OF 1.8 MG OR 3 TABLETS IN 24 HOURS, Disp: 270 tablet, Rfl: 0 .  levothyroxine (SYNTHROID) 200 MCG tablet, Take 1 tablet (200 mcg total) by mouth every morning. He has a separate Rx for 25 mcg to make a total daily dose of 265mcg., Disp: 90 tablet, Rfl: 1 .  levothyroxine (SYNTHROID) 25 MCG tablet, Take 1 tablet (25 mcg total) by mouth daily before breakfast. He has a separate Rx for 200 mcg to make a total daily dose of 256mcg., Disp: 90 tablet, Rfl: 1 .  lisinopril (ZESTRIL) 10 MG tablet, TAKE 1 TABLET BY MOUTH EVERY DAY, Disp: 90 tablet, Rfl: 1 .  metFORMIN (GLUCOPHAGE) 1000 MG tablet, Take 1 tablet (1,000 mg total) by mouth 2 (two) times daily with a meal., Disp: 180 tablet, Rfl: 0   No Known Allergies Past Medical History:  Diagnosis Date  . Cancer ([place])  thyroid  . Gout   . High cholesterol   . Hypertension   . Thyroid disease    thyroid cancer    Past Surgical History:  Procedure Laterality Date  . EYE SURGERY Left    cataract removal  . FRACTURE SURGERY Left 1989   ankle  . THYROIDECTOMY N/A 03/03/2013   Procedure: TOTAL THYROIDECTOMY;  Surgeon: Jamesetta So, MD;  Location: [facility];  Service: General;  Laterality: N/A;    Social History   Socioeconomic History  . Marital status: Single    Spouse name: Not on file  . Number of children: Not on file  . Years of education: Not on file  . Highest education level: Not on file  Occupational History  . Not on file  Tobacco Use  . Smoking status: Former  Smoker    Packs/day: 0.50    Years: 10.00    Pack years: 5.00    Types: Cigarettes    Quit date: 11/27/2016    Years since quitting: 3.1  . Smokeless tobacco: Former Network engineer  . Vaping Use: Never used  Substance and Sexual Activity  . Alcohol use: Yes    Comment: occasional  . Drug use: No  . Sexual activity: Yes    Birth control/protection: None  Other Topics Concern  . Not on file  Social History Narrative  . Not on file   Social Determinants of Health   Financial Resource Strain:   . Difficulty of Paying Living Expenses:   Food Insecurity:   . Worried About Charity fundraiser in the Last Year:   . Arboriculturist in the Last Year:   Transportation Needs:   . Film/video editor (Medical):   Marland Kitchen Lack of Transportation (Non-Medical):   Physical Activity:   . Days of Exercise per Week:   . Minutes of Exercise per Session:   Stress:   . Feeling of Stress :   Social Connections:   . Frequency of Communication with Friends and Family:   . Frequency of Social Gatherings with Friends and Family:   . Attends Religious Services:   . Active Member of Clubs or Organizations:   . Attends Archivist Meetings:   Marland Kitchen Marital Status:   Intimate Partner Violence:   . Fear of Current or Ex-Partner:   . Emotionally Abused:   Marland Kitchen Physically Abused:   . Sexually Abused:         Objective:    BP (!) 170/112   Pulse 65   Temp (!) 97.5 F (36.4 C) (Temporal)   Ht 5\' 10"  (1.778 m)   Wt 293 lb 6.4 oz (133.1 kg)   SpO2 95%   BMI 42.10 kg/m   Wt Readings from Last 3 Encounters:  01/11/20 293 lb 6.4 oz (133.1 kg)  09/29/19 292 lb 12.8 oz (132.8 kg)  07/11/18 280 lb (127 kg)    Physical Exam Vitals reviewed.  Constitutional:      General: He is not in acute distress.    Appearance: Normal appearance. He is morbidly obese. He is not ill-appearing, toxic-appearing or diaphoretic.  HENT:     Head: Normocephalic and atraumatic.  Eyes:     General: No scleral  icterus.       Right eye: No discharge.        Left eye: No discharge.     Conjunctiva/sclera: Conjunctivae normal.  Cardiovascular:     Rate and Rhythm: Normal rate and regular rhythm.  Heart sounds: Normal heart sounds. No murmur heard.  No friction rub. No gallop.   Pulmonary:     Effort: Pulmonary effort is normal. No respiratory distress.     Breath sounds: Normal breath sounds. No stridor. No wheezing, rhonchi or rales.  Musculoskeletal:        General: Normal range of motion.     Cervical back: Normal range of motion.  Skin:    General: Skin is warm and dry.  Neurological:     Mental Status: He is alert and oriented to person, place, and time. Mental status is at baseline.  Psychiatric:        Mood and Affect: Mood normal.        Behavior: Behavior normal.        Thought Content: Thought content normal.        Judgment: Judgment normal.     Lab Results  Component Value Date   TSH 12.48 (H) 09/14/2019   Lab Results  Component Value Date   WBC 11.7 (H) 01/07/2018   HGB 14.9 01/07/2018   HCT 45.4 01/07/2018   MCV 91 01/07/2018   PLT 261 01/07/2018   Lab Results  Component Value Date   NA 141 09/14/2019   K 4.8 09/14/2019   CO2 26 09/14/2019   GLUCOSE 150 (H) 09/14/2019   BUN 11 09/14/2019   CREATININE 1.02 09/14/2019   BILITOT 0.5 09/14/2019   ALKPHOS 89 03/19/2016   AST 16 09/14/2019   ALT 26 09/14/2019   PROT 7.0 09/14/2019   ALBUMIN 4.3 03/19/2016   CALCIUM 9.7 09/14/2019   No results found for: CHOL No results found for: HDL No results found for: LDLCALC No results found for: TRIG No results found for: [facility] Lab Results  Component Value Date   HGBA1C 7.5 (A) 09/29/2019

## 2020-02-02 ENCOUNTER — Other Ambulatory Visit: Payer: Self-pay

## 2020-02-02 ENCOUNTER — Ambulatory Visit: Payer: BC Managed Care – PPO | Admitting: "Endocrinology

## 2020-02-02 DIAGNOSIS — E119 Type 2 diabetes mellitus without complications: Secondary | ICD-10-CM

## 2020-02-10 ENCOUNTER — Other Ambulatory Visit: Payer: Self-pay | Admitting: "Endocrinology

## 2020-02-10 DIAGNOSIS — M109 Gout, unspecified: Secondary | ICD-10-CM

## 2020-02-12 DIAGNOSIS — E890 Postprocedural hypothyroidism: Secondary | ICD-10-CM | POA: Diagnosis not present

## 2020-02-12 DIAGNOSIS — E559 Vitamin D deficiency, unspecified: Secondary | ICD-10-CM | POA: Diagnosis not present

## 2020-02-12 DIAGNOSIS — C73 Malignant neoplasm of thyroid gland: Secondary | ICD-10-CM | POA: Diagnosis not present

## 2020-02-12 DIAGNOSIS — E119 Type 2 diabetes mellitus without complications: Secondary | ICD-10-CM | POA: Diagnosis not present

## 2020-02-13 LAB — COMPLETE METABOLIC PANEL WITH GFR
AG Ratio: 1.5 (calc) (ref 1.0–2.5)
ALT: 33 U/L (ref 9–46)
AST: 22 U/L (ref 10–40)
Albumin: 4 g/dL (ref 3.6–5.1)
Alkaline phosphatase (APISO): 80 U/L (ref 36–130)
BUN: 10 mg/dL (ref 7–25)
CO2: 25 mmol/L (ref 20–32)
Calcium: 9.7 mg/dL (ref 8.6–10.3)
Chloride: 105 mmol/L (ref 98–110)
Creat: 0.99 mg/dL (ref 0.60–1.35)
GFR, Est African American: 109 mL/min/{1.73_m2} (ref >=60)
GFR, Est Non African American: 94 mL/min/{1.73_m2} (ref >=60)
Globulin: 2.7 g/dL (calc) (ref 1.9–3.7)
Glucose, Bld: 120 mg/dL (ref 65–139)
Potassium: 4.5 mmol/L (ref 3.5–5.3)
Sodium: 140 mmol/L (ref 135–146)
Total Bilirubin: 0.4 mg/dL (ref 0.2–1.2)
Total Protein: 6.7 g/dL (ref 6.1–8.1)

## 2020-02-13 LAB — THYROGLOBULIN LEVEL: Thyroglobulin: <0.1 ng/mL — ABNORMAL LOW

## 2020-02-13 LAB — TSH: TSH: 1.34 mIU/L (ref 0.40–4.50)

## 2020-02-13 LAB — VITAMIN D 25 HYDROXY (VIT D DEFICIENCY, FRACTURES): Vit D, 25-Hydroxy: 19 ng/mL — ABNORMAL LOW (ref 30–100)

## 2020-02-13 LAB — MICROALBUMIN / CREATININE URINE RATIO
Creatinine, Urine: 114 mg/dL (ref 20–320)
Microalb Creat Ratio: 259 mcg/mg creat — ABNORMAL HIGH (ref <30)
Microalb, Ur: 29.5 mg/dL

## 2020-02-13 LAB — THYROGLOBULIN ANTIBODY: Thyroglobulin Ab: <1 IU/mL (ref <=1)

## 2020-02-13 LAB — T4, FREE: Free T4: 1.7 ng/dL (ref 0.8–1.8)

## 2020-02-14 ENCOUNTER — Telehealth (INDEPENDENT_AMBULATORY_CARE_PROVIDER_SITE_OTHER): Payer: BC Managed Care – PPO | Admitting: "Endocrinology

## 2020-02-14 ENCOUNTER — Other Ambulatory Visit: Payer: Self-pay

## 2020-02-14 ENCOUNTER — Encounter: Payer: Self-pay | Admitting: "Endocrinology

## 2020-02-14 VITALS — BP 138/87 | Ht 70.0 in | Wt 270.0 lb

## 2020-02-14 DIAGNOSIS — E119 Type 2 diabetes mellitus without complications: Secondary | ICD-10-CM | POA: Diagnosis not present

## 2020-02-14 DIAGNOSIS — E890 Postprocedural hypothyroidism: Secondary | ICD-10-CM | POA: Diagnosis not present

## 2020-02-14 DIAGNOSIS — I10 Essential (primary) hypertension: Secondary | ICD-10-CM | POA: Diagnosis not present

## 2020-02-14 DIAGNOSIS — C73 Malignant neoplasm of thyroid gland: Secondary | ICD-10-CM

## 2020-02-14 DIAGNOSIS — E559 Vitamin D deficiency, unspecified: Secondary | ICD-10-CM | POA: Insufficient documentation

## 2020-02-14 MED ORDER — VITAMIN D3 125 MCG (5000 UT) PO CAPS: 5000.0000 [IU] | ORAL_CAPSULE | Freq: Every day | ORAL | 0 refills | Status: DC

## 2020-02-14 NOTE — Progress Notes (Signed)
02/14/2020                                    Endocrinology Telehealth Visit Follow up Note -During COVID -19 Pandemic  This visit type was conducted  via telephone due to national recommendations for restrictions regarding the COVID-19 Pandemic  in an effort to limit this patient's exposure and mitigate transmission of the corona virus.   I connected with Tyler [name] on 02/14/2020   by telephone and verified that I am speaking with the correct person using two identifiers. Tyler [name], Oct 27, 1975. he has verbally consented to this visit.  I was in my office and patient was in his residence. No other persons were with me during the encounter. All issues noted in this document were discussed and addressed. The format was not optimal for physical exam.   Subjective:    Patient ID: Tyler [name], male    DOB: 1976/02/28, PCP Rakes, Connye Burkitt, FNP   Past Medical History:  Diagnosis Date  . Cancer ([place])    thyroid  . Gout   . High cholesterol   . Hypertension   . Thyroid disease    thyroid cancer   Past Surgical History:  Procedure Laterality Date  . EYE SURGERY Left    cataract removal  . FRACTURE SURGERY Left 1989   ankle  . THYROIDECTOMY N/A 03/03/2013   Procedure: TOTAL THYROIDECTOMY;  Surgeon: Jamesetta So, MD;  Location: [facility];  Service: General;  Laterality: N/A;   Social History   Socioeconomic History  . Marital status: Single    Spouse name: Not on file  . Number of children: Not on file  . Years of education: Not on file  . Highest education level: Not on file  Occupational History  . Not on file  Tobacco Use  . Smoking status: Former Smoker    Packs/day: 0.50    Years: 10.00    Pack years: 5.00    Types: Cigarettes    Quit date: 11/27/2016    Years since quitting: 3.2  . Smokeless tobacco: Former Network engineer  . Vaping Use: Never used  Substance and Sexual Activity  . Alcohol use: Yes    Comment: occasional  . Drug use: No  . Sexual activity:  Yes    Birth control/protection: None  Other Topics Concern  . Not on file  Social History Narrative  . Not on file   Social Determinants of Health   Financial Resource Strain:   . Difficulty of Paying Living Expenses: Not on file  Food Insecurity:   . Worried About Charity fundraiser in the Last Year: Not on file  . Ran Out of Food in the Last Year: Not on file  Transportation Needs:   . Lack of Transportation (Medical): Not on file  . Lack of Transportation (Non-Medical): Not on file  Physical Activity:   . Days of Exercise per Week: Not on file  . Minutes of Exercise per Session: Not on file  Stress:   . Feeling of Stress : Not on file  Social Connections:   . Frequency of Communication with Friends and Family: Not on file  . Frequency of Social Gatherings with Friends and Family: Not on file  . Attends Religious Services: Not on file  . Active Member of Clubs or Organizations: Not on file  . Attends Archivist Meetings:  Not on file  . Marital Status: Not on file   Outpatient Encounter Medications as of 02/14/2020  Medication Sig  . Cholecalciferol (VITAMIN D3) 125 MCG (5000 UT) CAPS Take 1 capsule (5,000 Units total) by mouth daily.  . colchicine 0.6 MG tablet TAKE 2 TABLETS AT ONSET, THEN 1 TABLET 2 HOURS LATER- A MAXIMUM OF 3 TABLETS IN 24 HOURS  . levothyroxine (SYNTHROID) 200 MCG tablet Take 1 tablet (200 mcg total) by mouth every morning. He has a separate Rx for 25 mcg to make a total daily dose of 271mcg.  Marland Kitchen levothyroxine (SYNTHROID) 25 MCG tablet Take 1 tablet (25 mcg total) by mouth daily before breakfast. He has a separate Rx for 200 mcg to make a total daily dose of 230mcg.  Marland Kitchen lisinopril (ZESTRIL) 10 MG tablet TAKE 1 TABLET BY MOUTH EVERY DAY  . metFORMIN (GLUCOPHAGE) 1000 MG tablet Take 1 tablet (1,000 mg total) by mouth 2 (two) times daily with a meal.   No facility-administered encounter medications on file as of 02/14/2020.   ALLERGIES: No Known  Allergies VACCINATION STATUS: Immunization History  Administered Date(s) Administered  . Influenza-Unspecified 05/20/2000    HPI  44 yr old male with medical history as above . He underwent total thyroidectomy for thyroid cancer on 03/03/2013 by Dr. Arnoldo Morale at [facility] in [place].   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 no lymph nodes were identified.  He received his first Thyrogen stimulated I -131 thyroid remnant ablation on 05/05/13 same, with no evidence of distant metastasis. -Subsequent Thyrogen Stimulated  whole body scan in January 2017  was reported to be unremarkable for tumor recurrence/distant metastases.   He is on levothyroxine 225 mcg p.o. daily before breakfast.  He reports compliance.   no new complaints.  He has been forgetting his doses lately and when he remembers takes it in the evening with supper.  His previsit labs are consistent with adequate replacement.    -  thyroid/neck ultrasound  on 07/27/2014 was negative for thyroid remnant. - Another surveillance thyroid/neck ultrasound on 06/09/2016 was negative for thyroid remnant.  -- on October 04, 2017 he underwent another surveillance thyrogen stimulated  whole-body scan with negative findings for tumor recurrence or distant metastasis. -His most recent surveillance thyroid/neck ultrasound on April 07, 2019 was remarkable for surgical absence of thyroid lobes without evidence of  residual or recurrent thyroid tissue or lymphadenopathy.  - He also has hypertension, his previsit labs show new diagnosis of type 2 diabetes. He did stay on his Metformin.  A1c was 7.5% prior to his last visit.    he did comply with BP medication I prescribed for him.   Review of Systems  Review of systems  Constitutional: + Minimally fluctuating body weight,  current  Body mass index is 38.74 kg/m. , no fatigue, no subjective  hyperthermia, no subjective hypothermia Eyes: no blurry vision, no xerophthalmia ENT: no sore throat, no nodules palpated in throat, no dysphagia/odynophagia, no hoarseness Cardiovascular: no Chest Pain, no Shortness of Breath, no palpitations, no leg swelling Respiratory: no cough, no shortness of breath Gastrointestinal: no Nausea/Vomiting/Diarhhea Musculoskeletal: no muscle/joint aches Skin: no rashes, no hyperemia Neurological: no tremors, no numbness, no tingling, no dizziness Psychiatric: no depression, no anxiety   Objective:    BP 138/87   Ht 5\' 10"  (1.778 m)   Wt 270 lb (122.5 kg)   BMI 38.74 kg/m   Wt  Readings from Last 3 Encounters:  02/14/20 270 lb (122.5 kg)  01/11/20 293 lb 6.4 oz (133.1 kg)  09/29/19 292 lb 12.8 oz (132.8 kg)      Recent Results (from the past 2160 hour(s))  COMPLETE METABOLIC PANEL WITH GFR     Status: None   Collection Time: 02/12/20  9:33 AM  Result Value Ref Range   Glucose, Bld 120 65 - 139 mg/dL    Comment: .        Non-fasting reference interval .    BUN 10 7 - 25 mg/dL   Creat 0.99 0.60 - 1.35 mg/dL   GFR, Est Non African American 94 > OR = 60 mL/min/1.64m2   GFR, Est African American 109 > OR = 60 mL/min/1.45m2   BUN/Creatinine Ratio NOT APPLICABLE 6 - 22 (calc)   Sodium 140 135 - 146 mmol/L   Potassium 4.5 3.5 - 5.3 mmol/L   Chloride 105 98 - 110 mmol/L   CO2 25 20 - 32 mmol/L   Calcium 9.7 8.6 - 10.3 mg/dL   Total Protein 6.7 6.1 - 8.1 g/dL   Albumin 4.0 3.6 - 5.1 g/dL   Globulin 2.7 1.9 - 3.7 g/dL (calc)   AG Ratio 1.5 1.0 - 2.5 (calc)   Total Bilirubin 0.4 0.2 - 1.2 mg/dL   Alkaline phosphatase (APISO) 80 36 - 130 U/L   AST 22 10 - 40 U/L   ALT 33 9 - 46 U/L  Microalbumin / creatinine urine ratio     Status: Abnormal   Collection Time: 02/12/20  9:33 AM  Result Value Ref Range   Creatinine, Urine 114 20 - 320 mg/dL   Microalb, Ur 29.5 mg/dL    Comment: Verified by repeat analysis. Marland Kitchen Reference Range Not  established    Microalb Creat Ratio 259 (H) <30 mcg/mg creat    Comment: . The ADA defines abnormalities in albumin excretion as follows: Marland Kitchen Category         Result (mcg/mg creatinine) . Normal                    <30 Microalbuminuria         30-299  Clinical albuminuria   > OR = 300 . The ADA recommends that at least two of three specimens collected within a 3-6 month period be abnormal before considering a patient to be within a diagnostic category.   TSH     Status: None   Collection Time: 02/12/20  9:33 AM  Result Value Ref Range   TSH 1.34 0.40 - 4.50 mIU/L  T4, free     Status: None   Collection Time: 02/12/20  9:33 AM  Result Value Ref Range   Free T4 1.7 0.8 - 1.8 ng/dL  Thyroglobulin antibody     Status: None   Collection Time: 02/12/20  9:33 AM  Result Value Ref Range   Thyroglobulin Ab <1 < or = 1 IU/mL  Thyroglobulin Level     Status: Abnormal   Collection Time: 02/12/20  9:33 AM  Result Value Ref Range   Thyroglobulin <0.1 (L) ng/mL    Comment:       Reference Range:       Intact Thyroid   2.8-40.9       Athyrotic        <0.1 .       Note: Abnormal flagging is based       on the reference interval for  patients with intact thyroid. . . This test was performed using the Beckman Coulter  chemiluminescent method. Values obtained from different assay methods cannot be used interchangeably. Thyroglobulin levels, regardless of value, should not be interpreted as absolute evidence of the presence or absence of disease. .    Comment      Comment: . Thyroglobulin antibodies (TGAB) interfere with thyroglobulin (TG) assays; therefore, TGAB assay should always be performed in conjunction with a TG assay. . . For additional information, please refer to  http://education.quest[facility].com/faq/FAQ202  (This link is being provided for informational/ educational purposes only.) .   VITAMIN D 25 Hydroxy (Vit-D Deficiency, Fractures)     Status:  Abnormal   Collection Time: 02/12/20  9:33 AM  Result Value Ref Range   Vit D, 25-Hydroxy 19 (L) 30 - 100 ng/mL    Comment: Vitamin D Status         25-OH Vitamin D: . Deficiency:                    <20 ng/mL Insufficiency:             20 - 29 ng/mL Optimal:                 > or = 30 ng/mL . For 25-OH Vitamin D testing on patients on  D2-supplementation and patients for whom quantitation  of D2 and D3 fractions is required, the QuestAssureD(TM) 25-OH VIT D, (D2,D3), LC/MS/MS is recommended: order  code [id number] (patients >2yrs). See Note 1 . Note 1 . For additional information, please refer to  http://education.[facility].com/faq/FAQ199  (This link is being provided for informational/ educational purposes only.)       Diabetic Labs (most recent): Lab Results  Component Value Date   HGBA1C 7.5 (A) 09/29/2019   HGBA1C 6.5 (H) 01/20/2019   HGBA1C 6.0 (H) 05/24/2018     Assessment & Plan:   1. Postsurgical hypothyroidism  -His previsit thyroid function tests are consistent with appropriate replacement.  He is advised to continue levothyroxine 225 mcg p.o. daily before breakfast.     - We discussed about the correct intake of his thyroid hormone, on empty stomach at fasting, with water, separated by at least 30 minutes from breakfast and other medications,  and separated by more than 4 hours from calcium, iron, multivitamins, acid reflux medications (PPIs). -Patient is made aware of the fact that thyroid hormone replacement is needed for life, dose to be adjusted by periodic monitoring of thyroid function tests.  - TSH target for thyroid cancer patients is between 0.1-0.5.  2. Malignant neoplasm of thyroid gland ([place])  -  03/03/2013:  Status post  near total thyroidectomy for stage 1 multifocal FVPTC. pT1NxMx . 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 right lobe : follicular adenoma with foci of papillary  microcarcinoma , follicular variant. - After his surgery, he received  169mCi of I-131 remnant ablation on 05/05/13,  post therapy whole body scar showing no evidence of distant metastasis.  - surveillance ultrasound of the neck/thyroid in February 2016 was negative for thyroid remnants.  - whole-body scan with Thyrogen was negative for tumor recurrence on 07/05/2015.  -Another surveillance  thyroid/neck ultrasound on 06/09/2016 is remarkable for surgically absent thyroid.  - on October 04, 2017 he underwent another surveillance Thyrogen stimulated  whole-body scan with negative findings for tumor recurrence or distant metastasis. -His previous labs show thyroglobulin level undetectable (< 2) -Previsit surveillance thyroid/neck ultrasound  negative for  residual thyroid tissue or recurrence or lymphadenopathy.  -He will be considered for Thyrogen stimulated whole-body scan before his next visit in 6 months.   2.  Type 2 diabetes: Recent diagnosis, worsening. -He is approached to continue on  metformin 1000 g ER daily after breakfast.   - I had a long discussion with him about the progressive nature of diabetes and the pathology behind its complications. -His diabetes is complicated by obesity/sedentary life remains at a high risk for more acute and chronic complications which include CAD, CVA, CKD, retinopathy, and neuropathy. These are all discussed in detail with him.  - I have counseled him on diet management and weight loss, by adopting a carbohydrate restricted/protein rich diet.  - he  admits there is a room for improvement in his diet and drink choices. -  Suggestion is made for him to avoid simple carbohydrates  from his diet including Cakes, Sweet Desserts / Pastries, Ice Cream, Soda (diet and regular), Sweet Tea, Candies, Chips, Cookies, Sweet Pastries,  Store Bought Juices, Alcohol in Excess of  1-2 drinks a day, Artificial Sweeteners, Coffee Creamer, and "Sugar-free" Products. This  will help patient to have stable blood glucose profile and potentially avoid unintended weight gain.  - I encouraged him to switch to  unprocessed or minimally processed complex starch and increased protein intake (animal or plant source), fruits, and vegetables.  - He will be considered for incretin therapy as appropriate next visit.  - Patient specific target  A1c;  LDL, HDL, Triglycerides, and  Waist Circumference were discussed in detail.   3. Essential hypertension: he is advised to home monitor blood pressure and report if > 140/90 on 2 separate readings.  He is advised to continue lisinopril 10 mg p.o. daily.   - He has quit smoking in 2018.  He is advised to stay away from smoking.     4. obesity -He is BMI is [id number] complicating his care.  He is a candidate for modest weight loss.  See #2 above.  I discussed with him the fact that loss of 5-10% of body weight will have the most impact on his diabetes, hypertension.  5) vitamin D deficiency: I discussed initiated vitamin D3 5000 units daily for 90 days.   - I advised patient to maintain close follow up with Rakes, Connye Burkitt, FNP for primary care needs.     - Time spent on this patient care encounter:  30 minutes of which 50% was spent in  counseling and the rest reviewing  his current and  previous labs / studies and medications  doses and developing a plan for long term care. Leighton Ruff Wirsing  participated in the discussions, expressed understanding, and voiced agreement with the above plans.  All questions were answered to his satisfaction. he is encouraged to contact clinic should he have any questions or concerns prior to his return visit.   Follow up plan: Return in about 6 months (around 08/16/2020) for F/U with Pre-visit Labs, F/U with Whole Body Scan w/Thyrogen, NV A1c in Office.  Glade Lloyd, MD Phone: [phone]  Fax: [phone]  This note was partially dictated with voice recognition software. Similar sounding  words can be transcribed inadequately or may not  be corrected upon review.  02/14/2020, 5:24 PM

## 2020-02-15 ENCOUNTER — Ambulatory Visit: Payer: BC Managed Care – PPO | Admitting: Family Medicine

## 2020-02-22 ENCOUNTER — Other Ambulatory Visit: Payer: Self-pay

## 2020-02-22 MED ORDER — VALACYCLOVIR HCL 1 G PO TABS
1000.0000 mg | ORAL_TABLET | Freq: Two times a day (BID) | ORAL | 0 refills | Status: DC
Start: 2020-02-22 — End: 2020-04-26

## 2020-03-02 ENCOUNTER — Other Ambulatory Visit: Payer: Self-pay | Admitting: "Endocrinology

## 2020-03-02 DIAGNOSIS — I10 Essential (primary) hypertension: Secondary | ICD-10-CM

## 2020-04-02 DIAGNOSIS — I10 Essential (primary) hypertension: Secondary | ICD-10-CM | POA: Diagnosis not present

## 2020-04-02 DIAGNOSIS — I498 Other specified cardiac arrhythmias: Secondary | ICD-10-CM | POA: Diagnosis not present

## 2020-04-02 DIAGNOSIS — R0789 Other chest pain: Secondary | ICD-10-CM | POA: Diagnosis not present

## 2020-04-02 DIAGNOSIS — R079 Chest pain, unspecified: Secondary | ICD-10-CM | POA: Diagnosis not present

## 2020-04-02 DIAGNOSIS — Z8249 Family history of ischemic heart disease and other diseases of the circulatory system: Secondary | ICD-10-CM | POA: Diagnosis not present

## 2020-04-02 DIAGNOSIS — Z23 Encounter for immunization: Secondary | ICD-10-CM | POA: Diagnosis not present

## 2020-04-03 DIAGNOSIS — Z8249 Family history of ischemic heart disease and other diseases of the circulatory system: Secondary | ICD-10-CM | POA: Diagnosis not present

## 2020-04-03 DIAGNOSIS — I517 Cardiomegaly: Secondary | ICD-10-CM | POA: Diagnosis not present

## 2020-04-03 DIAGNOSIS — I10 Essential (primary) hypertension: Secondary | ICD-10-CM | POA: Diagnosis not present

## 2020-04-03 DIAGNOSIS — Z23 Encounter for immunization: Secondary | ICD-10-CM | POA: Diagnosis not present

## 2020-04-03 DIAGNOSIS — I498 Other specified cardiac arrhythmias: Secondary | ICD-10-CM | POA: Diagnosis not present

## 2020-04-03 DIAGNOSIS — R0789 Other chest pain: Secondary | ICD-10-CM | POA: Diagnosis not present

## 2020-04-04 DIAGNOSIS — I517 Cardiomegaly: Secondary | ICD-10-CM | POA: Diagnosis not present

## 2020-04-09 DIAGNOSIS — R9431 Abnormal electrocardiogram [ECG] [EKG]: Secondary | ICD-10-CM | POA: Diagnosis not present

## 2020-04-09 DIAGNOSIS — I10 Essential (primary) hypertension: Secondary | ICD-10-CM | POA: Diagnosis not present

## 2020-04-26 ENCOUNTER — Other Ambulatory Visit: Payer: Self-pay | Admitting: "Endocrinology

## 2020-04-26 ENCOUNTER — Other Ambulatory Visit: Payer: Self-pay | Admitting: Family Medicine

## 2020-05-21 ENCOUNTER — Other Ambulatory Visit: Payer: Self-pay | Admitting: Nurse Practitioner

## 2020-05-21 ENCOUNTER — Other Ambulatory Visit: Payer: Self-pay | Admitting: "Endocrinology

## 2020-05-21 DIAGNOSIS — M109 Gout, unspecified: Secondary | ICD-10-CM

## 2020-05-22 MED ORDER — MELOXICAM 15 MG PO TABS: 15.0000 mg | ORAL_TABLET | Freq: Every day | ORAL | 1 refills | Status: DC

## 2020-05-22 NOTE — Addendum Note (Signed)
Addended by: Caryl Pina on: 05/22/2020 12:02 PM   Modules accepted: Orders

## 2020-05-22 NOTE — Telephone Encounter (Signed)
Sent in a refill for meloxicam for the patient.

## 2020-05-28 DIAGNOSIS — I10 Essential (primary) hypertension: Secondary | ICD-10-CM | POA: Diagnosis not present

## 2020-05-28 DIAGNOSIS — Z6841 Body Mass Index (BMI) 40.0 and over, adult: Secondary | ICD-10-CM | POA: Diagnosis not present

## 2020-05-30 ENCOUNTER — Ambulatory Visit: Payer: BC Managed Care – PPO | Admitting: Family Medicine

## 2020-05-30 ENCOUNTER — Encounter: Payer: Self-pay | Admitting: Family Medicine

## 2020-06-05 DIAGNOSIS — I517 Cardiomegaly: Secondary | ICD-10-CM | POA: Diagnosis not present

## 2020-06-05 DIAGNOSIS — I5189 Other ill-defined heart diseases: Secondary | ICD-10-CM | POA: Diagnosis not present

## 2020-06-17 DIAGNOSIS — R42 Dizziness and giddiness: Secondary | ICD-10-CM | POA: Diagnosis not present

## 2020-06-17 DIAGNOSIS — R531 Weakness: Secondary | ICD-10-CM | POA: Diagnosis not present

## 2020-06-17 DIAGNOSIS — I959 Hypotension, unspecified: Secondary | ICD-10-CM | POA: Diagnosis not present

## 2020-07-02 DIAGNOSIS — R8290 Unspecified abnormal findings in urine: Secondary | ICD-10-CM | POA: Diagnosis not present

## 2020-07-02 DIAGNOSIS — I428 Other cardiomyopathies: Secondary | ICD-10-CM | POA: Insufficient documentation

## 2020-07-02 DIAGNOSIS — I10 Essential (primary) hypertension: Secondary | ICD-10-CM | POA: Diagnosis not present

## 2020-07-02 DIAGNOSIS — I429 Cardiomyopathy, unspecified: Secondary | ICD-10-CM | POA: Diagnosis not present

## 2020-07-02 DIAGNOSIS — I517 Cardiomegaly: Secondary | ICD-10-CM | POA: Diagnosis not present

## 2020-07-02 DIAGNOSIS — Z6841 Body Mass Index (BMI) 40.0 and over, adult: Secondary | ICD-10-CM | POA: Diagnosis not present

## 2020-07-05 ENCOUNTER — Telehealth: Payer: Self-pay

## 2020-07-05 MED ORDER — BENZONATATE 100 MG PO CAPS: 100.0000 mg | ORAL_CAPSULE | Freq: Three times a day (TID) | ORAL | 0 refills | Status: DC | PRN

## 2020-07-05 MED ORDER — ONDANSETRON HCL 4 MG PO TABS: 4.0000 mg | ORAL_TABLET | Freq: Three times a day (TID) | ORAL | 0 refills | Status: DC | PRN

## 2020-07-05 MED ORDER — ALBUTEROL SULFATE HFA 108 (90 BASE) MCG/ACT IN AERS: 2.0000 | INHALATION_SPRAY | Freq: Four times a day (QID) | RESPIRATORY_TRACT | 0 refills | Status: DC | PRN

## 2020-07-05 NOTE — Telephone Encounter (Signed)
I have sent in : -zofran for nausea -tessalon perles - for cough -Albuterol inhaler ofr -sob

## 2020-07-05 NOTE — Telephone Encounter (Signed)
Spoke with pt wife they called is cardiologist who told him to contact us. He is taking his lisiopril 10mg  once a day and just added carevedilol 3.125mg  bid and isosorbide 30mg  once a day he started those 2 Tuesday. Patient denies vision changes or chest pain. He is having bad headache. Please advise covering pcp.

## 2020-07-05 NOTE — Telephone Encounter (Signed)
Patient aware.

## 2020-07-05 NOTE — Telephone Encounter (Signed)
Double up on lisinopril and keel diay of blood pressure. Let us know Monday how it is. If he continues to have issues, he will need to go  to the ER.

## 2020-07-05 NOTE — Telephone Encounter (Signed)
Wife is aware of instruction.  She has questions about treating him for Covid.  She reports he is having nausea, vomiting, diarrhea, cough and chest congestion.  She is concerned about giving him OTC medication for the cough and congestion because of his blood pressure and would like to know what you recommend.  She also would like to know if Zofran can be sent in for nausea and vomiting.  Their pharmacy is [facility].  Please advise.

## 2020-07-10 ENCOUNTER — Encounter: Payer: Self-pay | Admitting: Family Medicine

## 2020-07-10 ENCOUNTER — Ambulatory Visit: Payer: BC Managed Care – PPO | Admitting: Family Medicine

## 2020-07-10 ENCOUNTER — Other Ambulatory Visit: Payer: Self-pay

## 2020-07-10 VITALS — BP 97/66 | HR 98 | Temp 98.2°F | Ht 70.0 in | Wt 288.4 lb

## 2020-07-10 DIAGNOSIS — M546 Pain in thoracic spine: Secondary | ICD-10-CM

## 2020-07-10 MED ORDER — CYCLOBENZAPRINE HCL 10 MG PO TABS: 10.0000 mg | ORAL_TABLET | Freq: Three times a day (TID) | ORAL | 0 refills | Status: DC | PRN

## 2020-07-10 MED ORDER — METHYLPREDNISOLONE 4 MG PO TBPK: ORAL_TABLET | ORAL | 0 refills | Status: DC

## 2020-07-10 NOTE — Patient Instructions (Signed)
Costochondritis  Costochondritis is irritation and swelling (inflammation) of the tissue that connects the ribs to the breastbone (sternum). This tissue is called cartilage. Costochondritis causes pain in the front of the chest. Usually, the pain:  Starts slowly.  Is in more than one rib. What are the causes? The exact cause of this condition is not always known. It results from stress on the tissue in the affected area. The cause of this stress could be:  Chest injury.  Exercise or activity, such as lifting.  Very bad coughing. What increases the risk? You are more likely to develop this condition if you:  Are male.  Are 30-40 years old.  Recently started a new exercise or work activity.  Have low levels of vitamin D.  Have a condition that makes you cough often. What are the signs or symptoms? The main symptom of this condition is chest pain. The pain:  Usually starts slowly and can be sharp or dull.  Gets worse with deep breathing, coughing, or exercise.  Gets better with rest.  May be worse when you press on the affected area of your ribs and breastbone. How is this treated? This condition usually goes away on its own over time. Your doctor may prescribe an NSAID, such as ibuprofen. This can help reduce pain and inflammation. Treatment may also include:  Resting and avoiding activities that make pain worse.  Putting heat or ice on the painful area.  Doing exercises to stretch your chest muscles. If these treatments do not help, your doctor may inject a numbing medicine to help relieve the pain. Follow these instructions at home: Managing pain, stiffness, and swelling  If told, put ice on the painful area. To do this: ? Put ice in a plastic bag. ? Place a towel between your skin and the bag. ? Leave the ice on for 20 minutes, 2-3 times a day.  If told, put heat on the affected area. Do this as often as told by your doctor. Use the heat source that your  doctor recommends, such as a moist heat pack or a heating pad. ? Place a towel between your skin and the heat source. ? Leave the heat on for 20-30 minutes. ? Take off the heat if your skin turns bright red. This is very important if you cannot feel pain, heat, or cold. You may have a greater risk of getting burned.      Activity  Rest as told by your doctor.  Do not do anything that makes your pain worse. This includes any activities that use chest, belly (abdomen), and side muscles.  Do not lift anything that is heavier than 10 lb (4.5 kg), or the limit that you are told, until your doctor says that it is safe.  Return to your normal activities as told by your doctor. Ask your doctor what activities are safe for you. General instructions  Take over-the-counter and prescription medicines only as told by your doctor.  Keep all follow-up visits as told by your doctor. This is important. Contact a doctor if:  You have chills or a fever.  Your pain does not go away or it gets worse.  You have a cough that does not go away. Get help right away if:  You are short of breath.  You have very bad chest pain that is not helped by medicines, heat, or ice. These symptoms may be an emergency. Do not wait to see if the symptoms will go away. Get   medical help right away. Call your local emergency services (911 in the [place] Do not drive yourself to the hospital. Summary  Costochondritis is irritation and swelling (inflammation) of the tissue that connects the ribs to the breastbone (sternum).  This condition causes pain in the front of the chest.  Treatment may include medicines, rest, heat or ice, and exercises. This information is not intended to replace advice given to you by your health care provider. Make sure you discuss any questions you have with your health care provider. Document Revised: 04/21/2019 Document Reviewed: 04/21/2019 Elsevier Patient Education  2021 Elsevier Inc.  

## 2020-07-10 NOTE — Progress Notes (Signed)
Acute Office Visit  Subjective:    Patient ID: Tyler [name], male    DOB: 20-Apr-1976, 45 y.o.   MRN: [id number]  Chief Complaint  Patient presents with  . Back Pain    HPI Patient is in today for back pain.  [name] reports back pain for a few weeks. This pain is located thoracic, just left of midline. He recently had Covid on 07/04/20. His Covid symptoms have improved, however his back pain worsened 2 days ago. The pain is worse with a deep breath, sneezing, coughing, or certain movements. The pain feels like someone hit him with a ball bat. The pain is at a constant tolerable level then with severe with deep breath or movement. The pain is 4-7/10. He does feel "stretching and tightening" at times. He denies fever, chest pain, nausea, vomiting, dysuria, urinary frequency, urinary odor, or abdominal pain. He has been staying well hydrated. He takes mobic 15 mg daily without improvement. He has also tried tylenol without improvement.   Past Medical History:  Diagnosis Date  . Cancer ([place])    thyroid  . Gout   . High cholesterol   . Hypertension   . Thyroid disease    thyroid cancer    Past Surgical History:  Procedure Laterality Date  . EYE SURGERY Left    cataract removal  . FRACTURE SURGERY Left 1989   ankle  . THYROIDECTOMY N/A 03/03/2013   Procedure: TOTAL THYROIDECTOMY;  Surgeon: Jamesetta So, MD;  Location: [facility];  Service: General;  Laterality: N/A;    Family History  Problem Relation Age of Onset  . Hypertension Father   . Hyperlipidemia Father     Social History   Socioeconomic History  . Marital status: Single    Spouse name: Not on file  . Number of children: Not on file  . Years of education: Not on file  . Highest education level: Not on file  Occupational History  . Not on file  Tobacco Use  . Smoking status: Former Smoker    Packs/day: 0.50    Years: 10.00    Pack years: 5.00    Types: Cigarettes    Quit date: 11/27/2016    Years since  quitting: 3.6  . Smokeless tobacco: Former Network engineer  . Vaping Use: Never used  Substance and Sexual Activity  . Alcohol use: Yes    Comment: occasional  . Drug use: No  . Sexual activity: Yes    Birth control/protection: None  Other Topics Concern  . Not on file  Social History Narrative  . Not on file   Social Determinants of Health   Financial Resource Strain: Not on file  Food Insecurity: Not on file  Transportation Needs: Not on file  Physical Activity: Not on file  Stress: Not on file  Social Connections: Not on file  Intimate Partner Violence: Not on file    Outpatient Medications Prior to Visit  Medication Sig Dispense Refill  . albuterol (VENTOLIN HFA) 108 (90 Base) MCG/ACT inhaler Inhale 2 puffs into the lungs every 6 (six) hours as needed for wheezing or shortness of breath. 8 g 0  . Cholecalciferol (VITAMIN D3) 125 MCG (5000 UT) CAPS Take 1 capsule (5,000 Units total) by mouth daily. 90 capsule 0  . colchicine 0.6 MG tablet TAKE 2 TABLETS AT ONSET, THEN 1 TABLET 2 HOURS LATER- A MAXIMUM OF 3 TABLETS IN 24 HOURS 270 tablet 0  . levothyroxine (SYNTHROID) 200 MCG tablet  TAKE 1 TABLET EVERY MORNING. HE HAS A SEPARATE RX FOR 25 MCG TO MAKE A TOTAL DAILY DOSE OF 225MCG. 90 tablet 1  . levothyroxine (SYNTHROID) 25 MCG tablet Take 1 tablet (25 mcg total) by mouth daily before breakfast. He has a separate Rx for 200 mcg to make a total daily dose of 212mcg. 90 tablet 1  . lisinopril (ZESTRIL) 10 MG tablet TAKE 1 TABLET BY MOUTH EVERY DAY 90 tablet 1  . meloxicam (MOBIC) 15 MG tablet Take 1 tablet (15 mg total) by mouth daily. 30 tablet 1  . metFORMIN (GLUCOPHAGE) 1000 MG tablet TAKE 1 TABLET (1,000 MG TOTAL) BY MOUTH 2 (TWO) TIMES DAILY WITH A MEAL. 180 tablet 0  . valACYclovir (VALTREX) 1000 MG tablet TAKE 1 TABLET BY MOUTH TWICE A DAY 20 tablet 0  . amLODipine (NORVASC) 5 MG tablet Take 5 mg by mouth 2 (two) times daily.    . carvedilol (COREG) 3.125 MG tablet Take  3.125 mg by mouth 2 (two) times daily.    . benzonatate (TESSALON) 100 MG capsule Take 1 capsule (100 mg total) by mouth 3 (three) times daily as needed for cough. 30 capsule 0  . ondansetron (ZOFRAN) 4 MG tablet Take 1 tablet (4 mg total) by mouth every 8 (eight) hours as needed for nausea or vomiting. 20 tablet 0   No facility-administered medications prior to visit.    No Known Allergies  Review of Systems Negative unless specially indicated above in HPI.     Objective:    Physical Exam Vitals and nursing note reviewed.  Constitutional:      Appearance: Normal appearance. He is not ill-appearing, toxic-appearing or diaphoretic.  Cardiovascular:     Rate and Rhythm: Normal rate and regular rhythm.     Heart sounds: Normal heart sounds. No murmur heard.   Pulmonary:     Effort: Pulmonary effort is normal. No respiratory distress.     Breath sounds: Normal breath sounds.  Abdominal:     General: Bowel sounds are normal. There is no distension.     Palpations: Abdomen is soft.     Tenderness: There is no abdominal tenderness. There is no right CVA tenderness, left CVA tenderness or guarding.  Musculoskeletal:        General: Normal range of motion.       Back:  Skin:    General: Skin is warm and dry.     Coloration: Skin is not jaundiced.     Findings: No erythema.  Neurological:     Mental Status: He is alert and oriented to person, place, and time.     Motor: No weakness.     Gait: Gait normal.  Psychiatric:        Mood and Affect: Mood normal.        Behavior: Behavior normal.     BP 97/66   Pulse 98   Temp 98.2 F (36.8 C) (Temporal)   Ht 5\' 10"  (1.778 m)   Wt 288 lb 6 oz (130.8 kg)   SpO2 95%   BMI 41.38 kg/m  Wt Readings from Last 3 Encounters:  07/10/20 288 lb 6 oz (130.8 kg)  02/14/20 270 lb (122.5 kg)  01/11/20 293 lb 6.4 oz (133.1 kg)    Health Maintenance Due  Topic Date Due  . Hepatitis C Screening  Never done  . PNEUMOCOCCAL  POLYSACCHARIDE VACCINE AGE 70-64 HIGH RISK  Never done  . FOOT EXAM  Never done  . OPHTHALMOLOGY EXAM  Never done  . HIV Screening  Never done  . HEMOGLOBIN A1C  03/30/2020  . COVID-19 Vaccine (2 - Booster for Janssen series) 05/29/2020    There are no preventive care reminders to display for this patient.   Lab Results  Component Value Date   TSH 1.34 02/12/2020   Lab Results  Component Value Date   WBC 11.7 (H) 01/07/2018   HGB 14.9 01/07/2018   HCT 45.4 01/07/2018   MCV 91 01/07/2018   PLT 261 01/07/2018   Lab Results  Component Value Date   NA 140 02/12/2020   K 4.5 02/12/2020   CO2 25 02/12/2020   GLUCOSE 120 02/12/2020   BUN 10 02/12/2020   CREATININE 0.99 02/12/2020   BILITOT 0.4 02/12/2020   ALKPHOS 89 03/19/2016   AST 22 02/12/2020   ALT 33 02/12/2020   PROT 6.7 02/12/2020   ALBUMIN 4.3 03/19/2016   CALCIUM 9.7 02/12/2020   No results found for: CHOL No results found for: HDL No results found for: LDLCALC No results found for: TRIG No results found for: [place] Lab Results  Component Value Date   HGBA1C 7.5 (A) 09/29/2019       Assessment & Plan:   Kaelan was seen today for back pain.  Diagnoses and all orders for this visit:  Acute left-sided thoracic back pain Discussed pain likely costochondritis, especially given recent viral infection. Continue mobic. Tylenol as needed. Steroid dosepak with flexeril for spasms. Return to office for new or worsening symptoms, or if symptoms persist.  -     methylPREDNISolone (MEDROL DOSEPAK) 4 MG TBPK tablet; Follow direction on dosepak. -     cyclobenzaprine (FLEXERIL) 10 MG tablet; Take 1 tablet (10 mg total) by mouth 3 (three) times daily as needed for muscle spasms.   Follow up as needed for new or worsening symptoms, or if symptoms persist.   The patient indicates understanding of these issues and agrees with the plan.  Gwenlyn Perking, FNP

## 2020-07-30 DIAGNOSIS — I10 Essential (primary) hypertension: Secondary | ICD-10-CM | POA: Diagnosis not present

## 2020-07-30 DIAGNOSIS — Z6841 Body Mass Index (BMI) 40.0 and over, adult: Secondary | ICD-10-CM | POA: Diagnosis not present

## 2020-07-30 DIAGNOSIS — I428 Other cardiomyopathies: Secondary | ICD-10-CM | POA: Diagnosis not present

## 2020-08-07 ENCOUNTER — Ambulatory Visit (HOSPITAL_COMMUNITY)
Admission: RE | Admit: 2020-08-07 | Discharge: 2020-08-07 | Disposition: A | Discharge status: Home / Self Care | Payer: BC Managed Care – PPO | Source: Ambulatory Visit | Attending: "Endocrinology | Admitting: "Endocrinology

## 2020-08-08 ENCOUNTER — Ambulatory Visit (HOSPITAL_COMMUNITY): Payer: BC Managed Care – PPO

## 2020-08-09 ENCOUNTER — Ambulatory Visit (HOSPITAL_COMMUNITY): Payer: BC Managed Care – PPO

## 2020-08-12 ENCOUNTER — Ambulatory Visit (HOSPITAL_COMMUNITY): Payer: BC Managed Care – PPO

## 2020-08-13 ENCOUNTER — Other Ambulatory Visit: Payer: Self-pay | Admitting: "Endocrinology

## 2020-08-13 DIAGNOSIS — I10 Essential (primary) hypertension: Secondary | ICD-10-CM

## 2020-08-19 ENCOUNTER — Ambulatory Visit: Payer: BC Managed Care – PPO | Admitting: "Endocrinology

## 2020-08-29 ENCOUNTER — Other Ambulatory Visit: Payer: Self-pay | Admitting: Family Medicine

## 2020-09-03 DIAGNOSIS — R0781 Pleurodynia: Secondary | ICD-10-CM | POA: Diagnosis not present

## 2020-09-03 DIAGNOSIS — M546 Pain in thoracic spine: Secondary | ICD-10-CM | POA: Diagnosis not present

## 2020-09-28 ENCOUNTER — Other Ambulatory Visit: Payer: Self-pay | Admitting: Family Medicine

## 2020-10-30 ENCOUNTER — Other Ambulatory Visit: Payer: Self-pay | Admitting: Family Medicine

## 2020-11-23 ENCOUNTER — Other Ambulatory Visit: Payer: Self-pay | Admitting: "Endocrinology

## 2020-11-29 ENCOUNTER — Other Ambulatory Visit: Payer: Self-pay | Admitting: Family Medicine

## 2020-12-03 DIAGNOSIS — R0981 Nasal congestion: Secondary | ICD-10-CM | POA: Diagnosis not present

## 2020-12-03 DIAGNOSIS — J329 Chronic sinusitis, unspecified: Secondary | ICD-10-CM | POA: Diagnosis not present

## 2020-12-03 DIAGNOSIS — Z6839 Body mass index (BMI) 39.0-39.9, adult: Secondary | ICD-10-CM | POA: Diagnosis not present

## 2020-12-09 ENCOUNTER — Other Ambulatory Visit: Payer: Self-pay | Admitting: "Endocrinology

## 2020-12-12 DIAGNOSIS — L0100 Impetigo, unspecified: Secondary | ICD-10-CM | POA: Diagnosis not present

## 2020-12-12 DIAGNOSIS — M1A9XX1 Chronic gout, unspecified, with tophus (tophi): Secondary | ICD-10-CM | POA: Diagnosis not present

## 2020-12-12 DIAGNOSIS — B351 Tinea unguium: Secondary | ICD-10-CM | POA: Diagnosis not present

## 2020-12-23 ENCOUNTER — Other Ambulatory Visit: Payer: Self-pay | Admitting: "Endocrinology

## 2020-12-26 ENCOUNTER — Other Ambulatory Visit: Payer: Self-pay | Admitting: "Endocrinology

## 2020-12-26 DIAGNOSIS — C73 Malignant neoplasm of thyroid gland: Secondary | ICD-10-CM

## 2020-12-26 DIAGNOSIS — E559 Vitamin D deficiency, unspecified: Secondary | ICD-10-CM

## 2020-12-26 DIAGNOSIS — R9431 Abnormal electrocardiogram [ECG] [EKG]: Secondary | ICD-10-CM | POA: Diagnosis not present

## 2020-12-26 DIAGNOSIS — R0683 Snoring: Secondary | ICD-10-CM | POA: Insufficient documentation

## 2020-12-26 DIAGNOSIS — I10 Essential (primary) hypertension: Secondary | ICD-10-CM | POA: Diagnosis not present

## 2020-12-26 DIAGNOSIS — R0789 Other chest pain: Secondary | ICD-10-CM | POA: Diagnosis not present

## 2020-12-26 DIAGNOSIS — G4730 Sleep apnea, unspecified: Secondary | ICD-10-CM | POA: Insufficient documentation

## 2020-12-26 DIAGNOSIS — R5382 Chronic fatigue, unspecified: Secondary | ICD-10-CM | POA: Insufficient documentation

## 2020-12-26 DIAGNOSIS — I428 Other cardiomyopathies: Secondary | ICD-10-CM | POA: Diagnosis not present

## 2020-12-28 ENCOUNTER — Other Ambulatory Visit: Payer: Self-pay | Admitting: "Endocrinology

## 2020-12-30 ENCOUNTER — Other Ambulatory Visit: Payer: Self-pay | Admitting: Family Medicine

## 2021-01-01 DIAGNOSIS — M25521 Pain in right elbow: Secondary | ICD-10-CM | POA: Diagnosis not present

## 2021-01-01 DIAGNOSIS — M25522 Pain in left elbow: Secondary | ICD-10-CM | POA: Diagnosis not present

## 2021-01-10 DIAGNOSIS — I10 Essential (primary) hypertension: Secondary | ICD-10-CM | POA: Diagnosis not present

## 2021-01-10 DIAGNOSIS — R9431 Abnormal electrocardiogram [ECG] [EKG]: Secondary | ICD-10-CM | POA: Diagnosis not present

## 2021-01-17 DIAGNOSIS — I428 Other cardiomyopathies: Secondary | ICD-10-CM | POA: Diagnosis not present

## 2021-01-17 DIAGNOSIS — R9431 Abnormal electrocardiogram [ECG] [EKG]: Secondary | ICD-10-CM | POA: Diagnosis not present

## 2021-01-17 DIAGNOSIS — I10 Essential (primary) hypertension: Secondary | ICD-10-CM | POA: Diagnosis not present

## 2021-01-22 ENCOUNTER — Encounter (HOSPITAL_COMMUNITY): Payer: Self-pay

## 2021-01-22 ENCOUNTER — Ambulatory Visit (HOSPITAL_COMMUNITY)
Admission: RE | Admit: 2021-01-22 | Discharge: 2021-01-22 | Disposition: A | Discharge status: Home / Self Care | Payer: BC Managed Care – PPO | Source: Ambulatory Visit | Attending: "Endocrinology | Admitting: "Endocrinology

## 2021-01-22 ENCOUNTER — Other Ambulatory Visit: Payer: Self-pay

## 2021-01-22 DIAGNOSIS — C73 Malignant neoplasm of thyroid gland: Secondary | ICD-10-CM | POA: Insufficient documentation

## 2021-01-22 MED ORDER — THYROTROPIN ALFA 0.9 MG IM SOLR
INTRAMUSCULAR | Status: AC
  Filled 2021-01-22: qty 0.9

## 2021-01-22 MED ORDER — THYROTROPIN ALFA 0.9 MG IM SOLR: 0.9000 mg | INTRAMUSCULAR | Status: DC

## 2021-01-22 MED ORDER — STERILE WATER FOR INJECTION IJ SOLN
INTRAMUSCULAR | Status: AC
  Filled 2021-01-22: qty 10

## 2021-01-23 ENCOUNTER — Encounter (HOSPITAL_COMMUNITY): Payer: BC Managed Care – PPO

## 2021-01-24 ENCOUNTER — Encounter (HOSPITAL_COMMUNITY): Payer: BC Managed Care – PPO

## 2021-01-27 ENCOUNTER — Other Ambulatory Visit (HOSPITAL_COMMUNITY): Payer: BC Managed Care – PPO

## 2021-01-31 ENCOUNTER — Other Ambulatory Visit: Payer: Self-pay | Admitting: "Endocrinology

## 2021-02-03 DIAGNOSIS — I10 Essential (primary) hypertension: Secondary | ICD-10-CM | POA: Diagnosis not present

## 2021-02-03 DIAGNOSIS — R9431 Abnormal electrocardiogram [ECG] [EKG]: Secondary | ICD-10-CM | POA: Diagnosis not present

## 2021-02-03 DIAGNOSIS — I428 Other cardiomyopathies: Secondary | ICD-10-CM | POA: Diagnosis not present

## 2021-02-04 ENCOUNTER — Other Ambulatory Visit: Payer: Self-pay

## 2021-02-04 DIAGNOSIS — E119 Type 2 diabetes mellitus without complications: Secondary | ICD-10-CM

## 2021-02-04 DIAGNOSIS — C73 Malignant neoplasm of thyroid gland: Secondary | ICD-10-CM

## 2021-02-04 MED ORDER — LEVOTHYROXINE SODIUM 25 MCG PO TABS
25.0000 ug | ORAL_TABLET | Freq: Every day | ORAL | 0 refills | Status: DC
Start: 2021-02-04 — End: 2021-03-24

## 2021-02-04 MED ORDER — METFORMIN HCL 1000 MG PO TABS: 1000.0000 mg | ORAL_TABLET | Freq: Two times a day (BID) | ORAL | 0 refills | Status: DC

## 2021-02-04 MED ORDER — LEVOTHYROXINE SODIUM 200 MCG PO TABS
ORAL_TABLET | ORAL | 0 refills | Status: DC
Start: 2021-02-04 — End: 2021-03-24

## 2021-02-25 DIAGNOSIS — I10 Essential (primary) hypertension: Secondary | ICD-10-CM | POA: Diagnosis not present

## 2021-02-25 DIAGNOSIS — R29818 Other symptoms and signs involving the nervous system: Secondary | ICD-10-CM | POA: Diagnosis not present

## 2021-02-25 DIAGNOSIS — I428 Other cardiomyopathies: Secondary | ICD-10-CM | POA: Diagnosis not present

## 2021-02-25 DIAGNOSIS — R0681 Apnea, not elsewhere classified: Secondary | ICD-10-CM | POA: Diagnosis not present

## 2021-02-28 DIAGNOSIS — I428 Other cardiomyopathies: Secondary | ICD-10-CM | POA: Diagnosis not present

## 2021-02-28 DIAGNOSIS — R0789 Other chest pain: Secondary | ICD-10-CM | POA: Diagnosis not present

## 2021-02-28 DIAGNOSIS — I10 Essential (primary) hypertension: Secondary | ICD-10-CM | POA: Diagnosis not present

## 2021-02-28 DIAGNOSIS — R9431 Abnormal electrocardiogram [ECG] [EKG]: Secondary | ICD-10-CM | POA: Diagnosis not present

## 2021-03-03 DIAGNOSIS — E119 Type 2 diabetes mellitus without complications: Secondary | ICD-10-CM | POA: Diagnosis not present

## 2021-03-03 DIAGNOSIS — E890 Postprocedural hypothyroidism: Secondary | ICD-10-CM | POA: Diagnosis not present

## 2021-03-03 DIAGNOSIS — Z79899 Other long term (current) drug therapy: Secondary | ICD-10-CM | POA: Diagnosis not present

## 2021-03-03 DIAGNOSIS — Z85850 Personal history of malignant neoplasm of thyroid: Secondary | ICD-10-CM | POA: Diagnosis not present

## 2021-03-03 DIAGNOSIS — M109 Gout, unspecified: Secondary | ICD-10-CM | POA: Diagnosis not present

## 2021-03-03 DIAGNOSIS — I110 Hypertensive heart disease with heart failure: Secondary | ICD-10-CM | POA: Diagnosis not present

## 2021-03-03 DIAGNOSIS — I10 Essential (primary) hypertension: Secondary | ICD-10-CM | POA: Diagnosis not present

## 2021-03-03 DIAGNOSIS — R519 Headache, unspecified: Secondary | ICD-10-CM | POA: Diagnosis not present

## 2021-03-03 DIAGNOSIS — R079 Chest pain, unspecified: Secondary | ICD-10-CM | POA: Diagnosis not present

## 2021-03-03 DIAGNOSIS — Z7982 Long term (current) use of aspirin: Secondary | ICD-10-CM | POA: Diagnosis not present

## 2021-03-03 DIAGNOSIS — F17210 Nicotine dependence, cigarettes, uncomplicated: Secondary | ICD-10-CM | POA: Diagnosis not present

## 2021-03-03 DIAGNOSIS — I509 Heart failure, unspecified: Secondary | ICD-10-CM | POA: Diagnosis not present

## 2021-03-03 DIAGNOSIS — R9431 Abnormal electrocardiogram [ECG] [EKG]: Secondary | ICD-10-CM | POA: Diagnosis not present

## 2021-03-12 ENCOUNTER — Encounter (HOSPITAL_COMMUNITY): Payer: Self-pay

## 2021-03-12 ENCOUNTER — Other Ambulatory Visit: Payer: Self-pay

## 2021-03-12 ENCOUNTER — Ambulatory Visit (HOSPITAL_COMMUNITY)
Admission: RE | Admit: 2021-03-12 | Discharge: 2021-03-12 | Disposition: A | Discharge status: Home / Self Care | Payer: BC Managed Care – PPO | Source: Ambulatory Visit | Attending: "Endocrinology | Admitting: "Endocrinology

## 2021-03-12 ENCOUNTER — Other Ambulatory Visit: Payer: Self-pay | Admitting: "Endocrinology

## 2021-03-12 ENCOUNTER — Telehealth: Payer: Self-pay

## 2021-03-12 DIAGNOSIS — E890 Postprocedural hypothyroidism: Secondary | ICD-10-CM

## 2021-03-12 DIAGNOSIS — C73 Malignant neoplasm of thyroid gland: Secondary | ICD-10-CM | POA: Insufficient documentation

## 2021-03-12 MED ORDER — THYROTROPIN ALFA 0.9 MG IM SOLR: 0.9000 mg | INTRAMUSCULAR | Status: AC

## 2021-03-12 MED ORDER — STERILE WATER FOR INJECTION IJ SOLN
INTRAMUSCULAR | Status: AC
  Administered 2021-03-12: 1.2 mL
  Filled 2021-03-12: qty 10

## 2021-03-12 MED ORDER — THYROTROPIN ALFA 0.9 MG IM SOLR
INTRAMUSCULAR | Status: AC
  Administered 2021-03-12: 0.9 mg via INTRAMUSCULAR
  Filled 2021-03-12: qty 0.9

## 2021-03-12 NOTE — Telephone Encounter (Signed)
Tyler [name] with [facility] called and said he has him on the schedule this Friday for a whole body scan. He said you normally order a Thyroglobulin Level/ Antibody to be drawn on the same day but they had to cancel his scan before because he had a CT with contrast and they could not do it. It does look like the patient did do those labs but it was August of 2021 and the last time he seen you was a phone visit 02/14/20. Would you like him to have this drawn Friday as well?  [name] can be reached at [phone].  I will call pt to sch a follow up as well.

## 2021-03-12 NOTE — Written Directive (Signed)
MOLECULAR IMAGING AND THERAPEUTICS WRITTEN DIRECTIVE   PATIENT NAME: Tyler [name]  PT DOB:   June 10, 1976                                              MRN: [id number]  ---------------------------------------------------------------------------------------------------------------------  I-131 WHOLE BODY SCAN    RADIOPHARMACEUTICAL: Iodine-131 Capsule for Diagnostic Imaging   PRESCRIBED DOSE FOR ADMINISTRATION: 4 mCi   ROUTE OFADMINISTRATION: PO   DIAGNOSIS: Thyroid Cancer   REFERRING PHYSICIAN: Dr. Donia Ast STIMULATION OR HORMONE WITHDRAW: Thyrogen Stimulation   DATE OF THYROIDECTOMY:03/03/2013   SURGEON:Dr. Arnoldo Morale   TSH:   Lab Results  Component Value Date   TSH 1.34 02/12/2020   TSH 12.48 (H) 09/14/2019   TSH 1.20 01/20/2019     PRIOR I-131 THERAPY (Date and Dose): 05/05/2013  100 mCi   ADDITIONAL PHYSICIAN COMMENTS/NOTES   AUTHORIZED USER SIGNATURE & TIME STAMP:

## 2021-03-12 NOTE — Telephone Encounter (Signed)
Can you place a new lab order

## 2021-03-13 ENCOUNTER — Ambulatory Visit (HOSPITAL_COMMUNITY)
Admission: RE | Admit: 2021-03-13 | Discharge: 2021-03-13 | Disposition: A | Discharge status: Home / Self Care | Payer: BC Managed Care – PPO | Source: Ambulatory Visit | Attending: "Endocrinology | Admitting: "Endocrinology

## 2021-03-13 ENCOUNTER — Encounter (HOSPITAL_COMMUNITY): Payer: Self-pay

## 2021-03-13 DIAGNOSIS — C73 Malignant neoplasm of thyroid gland: Secondary | ICD-10-CM

## 2021-03-13 MED ORDER — THYROTROPIN ALFA 0.9 MG IM SOLR
0.9000 mg | INTRAMUSCULAR | Status: AC
  Administered 2021-03-13: 0.9 mg via INTRAMUSCULAR

## 2021-03-13 MED ORDER — STERILE WATER FOR INJECTION IJ SOLN
1.2000 mL | Freq: Once | INTRAMUSCULAR | Status: AC
  Administered 2021-03-13: 1.2 mL via INTRAMUSCULAR

## 2021-03-13 MED ORDER — STERILE WATER FOR INJECTION IJ SOLN
INTRAMUSCULAR | Status: AC
  Filled 2021-03-13: qty 10

## 2021-03-13 MED ORDER — THYROTROPIN ALFA 0.9 MG IM SOLR
INTRAMUSCULAR | Status: AC
  Filled 2021-03-13: qty 0.9

## 2021-03-14 ENCOUNTER — Ambulatory Visit (HOSPITAL_COMMUNITY)
Admission: RE | Admit: 2021-03-14 | Discharge: 2021-03-14 | Disposition: A | Discharge status: Home / Self Care | Payer: BC Managed Care – PPO | Source: Ambulatory Visit | Attending: "Endocrinology | Admitting: "Endocrinology

## 2021-03-14 ENCOUNTER — Other Ambulatory Visit (HOSPITAL_COMMUNITY)
Admission: RE | Admit: 2021-03-14 | Discharge: 2021-03-14 | Disposition: A | Discharge status: Home / Self Care | Payer: BC Managed Care – PPO | Source: Ambulatory Visit | Attending: "Endocrinology | Admitting: "Endocrinology

## 2021-03-14 ENCOUNTER — Other Ambulatory Visit: Payer: Self-pay

## 2021-03-14 DIAGNOSIS — C73 Malignant neoplasm of thyroid gland: Secondary | ICD-10-CM | POA: Diagnosis not present

## 2021-03-14 DIAGNOSIS — E890 Postprocedural hypothyroidism: Secondary | ICD-10-CM | POA: Insufficient documentation

## 2021-03-14 LAB — TSH: TSH: 96.227 u[IU]/mL — ABNORMAL HIGH (ref 0.350–4.500)

## 2021-03-14 LAB — T4, FREE: Free T4: 1.51 ng/dL — ABNORMAL HIGH (ref 0.61–1.12)

## 2021-03-14 MED ORDER — SODIUM IODIDE I 131 CAPSULE
4.0000 | Freq: Once | INTRAVENOUS | Status: AC | PRN
  Administered 2021-03-14: 4.25 via ORAL

## 2021-03-17 ENCOUNTER — Other Ambulatory Visit: Payer: Self-pay

## 2021-03-17 ENCOUNTER — Ambulatory Visit (HOSPITAL_COMMUNITY)
Admission: RE | Admit: 2021-03-17 | Discharge: 2021-03-17 | Disposition: A | Discharge status: Home / Self Care | Payer: BC Managed Care – PPO | Source: Ambulatory Visit | Attending: "Endocrinology | Admitting: "Endocrinology

## 2021-03-17 DIAGNOSIS — C73 Malignant neoplasm of thyroid gland: Secondary | ICD-10-CM | POA: Diagnosis not present

## 2021-03-17 LAB — THYROGLOBULIN ANTIBODY: Thyroglobulin Antibody: <1 IU/mL (ref 0.0–0.9)

## 2021-03-19 DIAGNOSIS — I428 Other cardiomyopathies: Secondary | ICD-10-CM | POA: Diagnosis not present

## 2021-03-19 DIAGNOSIS — I10 Essential (primary) hypertension: Secondary | ICD-10-CM | POA: Diagnosis not present

## 2021-03-22 LAB — THYROGLOBULIN LEVEL: Thyroglobulin: <2 ng/mL

## 2021-03-24 ENCOUNTER — Other Ambulatory Visit: Payer: Self-pay

## 2021-03-24 ENCOUNTER — Encounter: Payer: Self-pay | Admitting: "Endocrinology

## 2021-03-24 ENCOUNTER — Ambulatory Visit (INDEPENDENT_AMBULATORY_CARE_PROVIDER_SITE_OTHER): Payer: BC Managed Care – PPO | Admitting: "Endocrinology

## 2021-03-24 VITALS — BP 138/98 | HR 80 | Ht 70.0 in | Wt 281.8 lb

## 2021-03-24 DIAGNOSIS — E119 Type 2 diabetes mellitus without complications: Secondary | ICD-10-CM

## 2021-03-24 DIAGNOSIS — C73 Malignant neoplasm of thyroid gland: Secondary | ICD-10-CM | POA: Diagnosis not present

## 2021-03-24 DIAGNOSIS — E890 Postprocedural hypothyroidism: Secondary | ICD-10-CM

## 2021-03-24 LAB — POCT GLYCOSYLATED HEMOGLOBIN (HGB A1C): HbA1c, POC (controlled diabetic range): 6.6 % (ref 0.0–7.0)

## 2021-03-24 MED ORDER — LEVOTHYROXINE SODIUM 200 MCG PO TABS: ORAL_TABLET | ORAL | 1 refills | Status: DC

## 2021-03-24 MED ORDER — METFORMIN HCL 1000 MG PO TABS: 1000.0000 mg | ORAL_TABLET | Freq: Two times a day (BID) | ORAL | 1 refills | Status: DC

## 2021-03-24 NOTE — Progress Notes (Signed)
03/24/2021      Endocrinology follow-up note    Subjective:    Patient ID: Tyler [name], male    DOB: [birth year], PCP [name], FNP   Past Medical History:  Diagnosis Date   Cancer ([place])    thyroid   Gout    High cholesterol    Hypertension    Thyroid disease    thyroid cancer   Past Surgical History:  Procedure Laterality Date   EYE SURGERY Left    cataract removal   FRACTURE SURGERY Left 1989   ankle   THYROIDECTOMY N/A 03/03/2013   Procedure: TOTAL THYROIDECTOMY;  Surgeon: Jamesetta So, MD;  Location: [facility];  Service: General;  Laterality: N/A;   Social History   Socioeconomic History   Marital status: Single    Spouse name: Not on file   Number of children: Not on file   Years of education: Not on file   Highest education level: Not on file  Occupational History   Not on file  Tobacco Use   Smoking status: Some Days    Packs/day: 0.50    Years: 10.00    Pack years: 5.00    Types: Cigarettes    Last attempt to quit: 11/27/2016    Years since quitting: 4.3   Smokeless tobacco: Former  Scientific laboratory technician Use: Never used  Substance and Sexual Activity   Alcohol use: Yes    Comment: occasional   Drug use: No   Sexual activity: Yes    Birth control/protection: None  Other Topics Concern   Not on file  Social History Narrative   Not on file   Social Determinants of Health   Financial Resource Strain: Not on file  Food Insecurity: Not on file  Transportation Needs: Not on file  Physical Activity: Not on file  Stress: Not on file  Social Connections: Not on file   Outpatient Encounter Medications as of 03/24/2021  Medication Sig   aspirin 81 MG EC tablet Take 1 tablet by mouth daily.   pravastatin (PRAVACHOL) 20 MG tablet Take 1 tablet by mouth daily.   terbinafine (LAMISIL) 250 MG tablet Take 1 tablet by mouth daily.   albuterol (VENTOLIN HFA) 108 (90 Base) MCG/ACT inhaler Inhale 2 puffs into the lungs every 6 (six) hours as needed  for wheezing or shortness of breath. (Patient not taking: Reported on 03/24/2021)   Cholecalciferol (VITAMIN D3) 125 MCG (5000 UT) CAPS Take 1 capsule (5,000 Units total) by mouth daily. (Patient not taking: Reported on 03/24/2021)   colchicine 0.6 MG tablet TAKE 2 TABLETS AT ONSET, THEN 1 TABLET 2 HOURS LATER- A MAXIMUM OF 3 TABLETS IN 24 HOURS   cyclobenzaprine (FLEXERIL) 10 MG tablet Take 1 tablet (10 mg total) by mouth 3 (three) times daily as needed for muscle spasms. (Patient not taking: Reported on 03/24/2021)   ENTRESTO 49-51 MG Take 1 tablet by mouth 2 (two) times daily.   isosorbide mononitrate (IMDUR) 30 MG 24 hr tablet Take 30 mg by mouth 3 (three) times daily.   levothyroxine (SYNTHROID) 200 MCG tablet TAKE 1 TABLET EVERY MORNING. HE HAS A SEPARATE RX FOR 25 MCG TO MAKE A TOTAL DAILY DOSE OF 225MCG.   meloxicam (MOBIC) 15 MG tablet TAKE 1 TABLET BY MOUTH EVERY DAY   metFORMIN (GLUCOPHAGE) 1000 MG tablet Take 1 tablet (1,000 mg total) by mouth 2 (two) times daily with a meal.   methylPREDNISolone (MEDROL DOSEPAK) 4 MG TBPK tablet Follow  direction on dosepak. (Patient not taking: Reported on 03/24/2021)   metoprolol succinate (TOPROL-XL) 25 MG 24 hr tablet Take 1 tablet by mouth every 12 (twelve) hours.   valACYclovir (VALTREX) 1000 MG tablet TAKE 1 TABLET BY MOUTH TWICE A DAY (Patient not taking: Reported on 03/24/2021)   [DISCONTINUED] amLODipine (NORVASC) 5 MG tablet Take 5 mg by mouth 2 (two) times daily. (Patient not taking: Reported on 03/24/2021)   [DISCONTINUED] carvedilol (COREG) 3.125 MG tablet Take 3.125 mg by mouth 2 (two) times daily. (Patient not taking: Reported on 03/24/2021)   [DISCONTINUED] levothyroxine (SYNTHROID) 200 MCG tablet TAKE 1 TABLET EVERY MORNING. HE HAS A SEPARATE RX FOR 25 MCG TO MAKE A TOTAL DAILY DOSE OF 225MCG.   [DISCONTINUED] levothyroxine (SYNTHROID) 25 MCG tablet Take 1 tablet (25 mcg total) by mouth daily before breakfast. He has a separate Rx for 200 mcg to  make a total daily dose of 277mcg. (Patient not taking: Reported on 03/24/2021)   [DISCONTINUED] lisinopril (ZESTRIL) 10 MG tablet TAKE 1 TABLET BY MOUTH EVERY DAY (Patient not taking: Reported on 03/24/2021)   [DISCONTINUED] metFORMIN (GLUCOPHAGE) 1000 MG tablet Take 1 tablet (1,000 mg total) by mouth 2 (two) times daily with a meal.   No facility-administered encounter medications on file as of 03/24/2021.   ALLERGIES: No Known Allergies VACCINATION STATUS: Immunization History  Administered Date(s) Administered   Influenza-Unspecified 05/20/2000   Janssen (J&J) SARS-COV-2 Vaccination 04/03/2020    HPI  45 yr old male with medical history as above . He underwent total thyroidectomy for thyroid cancer on 03/03/2013 by Dr. Arnoldo Morale at [facility] in [place].   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 no lymph nodes were identified.  He received his first Thyrogen stimulated I -131 thyroid remnant ablation on 05/05/13 same, with no evidence of distant metastasis. -Subsequent Thyrogen Stimulated  whole body scan in January 2017  was reported to be unremarkable for tumor recurrence/distant metastases.    -Recently, he underwent Thyrogen stimulated whole-body scan which was unremarkable. He ran out of his levothyroxine 25 mcg, and stayed on only 200 mcg for the last month.  His previsit labs are consistent with appropriate replacement.   See notes from his previous visits. - He also has hypertension, type 2 diabetes, currently on metformin.  His point-of-care A1c is 6.6%, improving from 7.5%  Review of Systems  Review of systems  Constitutional: + Minimally fluctuating body weight,  current  Body mass index is 40.43 kg/m. , no fatigue, no subjective hyperthermia, no subjective hypothermia    Objective:    BP (!) 138/98   Pulse 80   Ht 5\' 10"  (1.778 m)   Wt 281 lb 12.8 oz (127.8 kg)   BMI  40.43 kg/m   Wt Readings from Last 3 Encounters:  03/24/21 281 lb 12.8 oz (127.8 kg)  07/10/20 288 lb 6 oz (130.8 kg)  02/14/20 270 lb (122.5 kg)      Recent Results (from the past 2160 hour(s))  TSH     Status: Abnormal   Collection Time: 03/14/21  9:08 AM  Result Value Ref Range   TSH 96.227 (H) 0.350 - 4.500 uIU/mL    Comment: Performed by a 3rd Generation assay with a functional sensitivity of <=0.01 uIU/mL. Performed at [facility], [address], [place], [place] [address]   T4, free     Status: Abnormal   Collection Time: 03/14/21  9:08 AM  Result Value Ref Range   Free T4 1.51 (H) 0.61 - 1.12 ng/dL    Comment: (NOTE) Biotin ingestion may interfere with free T4 tests. If the results are inconsistent with the TSH level, previous test results, or the clinical presentation, then consider biotin interference. If needed, order repeat testing after stopping biotin. Performed at [facility], [place] [address] [place], [place] [address]   Thyroglobulin     Status: None   Collection Time: 03/14/21  9:08 AM  Result Value Ref Range   Thyroglobulin <2.0 ng/mL    Comment: (NOTE) This test was developed and its performance characteristics determined by [facility]. It has not been cleared or approved by the Food and Drug Administration. Reference Range: Pubertal Children and Adults: <40 According to the [facility] of Clinical Biochemistry, the reference interval for Thyroglobulin (TG) should be related to euthyroid patients and not for patients who underwent thyroidectomy.  TG reference intervals for these patients depend on the residual mass of the thyroid tissue left after surgery.  Establishing a post-operative baseline is recommended.  The assay quantitation limit is 2.0 ng/mL. Performed At: [facility] [place], [place] [id number] Jake Bathe F MD [id number]   Thyroglobulin antibody     Status: None   Collection Time: 03/14/21   9:08 AM  Result Value Ref Range   Thyroglobulin Antibody <1.0 0.0 - 0.9 IU/mL    Comment: (NOTE) Thyroglobulin Antibody measured by [facility] Methodology Performed At: [facility] [place], [place] [id number] Rush Farmer MD [id number]   HgB A1c     Status: None   Collection Time: 03/24/21  4:14 PM  Result Value Ref Range   Hemoglobin A1C     HbA1c POC (<> result, manual entry)     HbA1c, POC (prediabetic range)     HbA1c, POC (controlled diabetic range) 6.6 0.0 - 7.0 %      Diabetic Labs (most recent): Lab Results  Component Value Date   HGBA1C 6.6 03/24/2021   HGBA1C 7.5 (A) 09/29/2019   HGBA1C 6.5 (H) 01/20/2019     Assessment & Plan:   1. Postsurgical hypothyroidism  -His previsit thyroid function tests are consistent with appropriate replacement, while he stayed only on levothyroxine 200 mcg p.o. daily for the last month.  He is advised to remain on the same dose.   - We discussed about the correct intake of his thyroid hormone, on empty stomach at fasting, with water, separated by at least 30 minutes from breakfast and other medications,  and separated by more than 4 hours from calcium, iron, multivitamins, acid reflux medications (PPIs). -Patient is made aware of the fact that thyroid hormone replacement is needed for life, dose to be adjusted by periodic monitoring of thyroid function tests.   - TSH target for thyroid cancer patients is between 0.1-0.5.  2. Malignant neoplasm of thyroid gland ([place])  -  03/03/2013:  Status post  near total thyroidectomy for stage 1 multifocal FVPTC. pT1NxMx . 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 - After his surgery, he received  177mCi of I-131 remnant ablation on 05/05/13,  post therapy whole body scar showing no evidence of distant  metastasis.  - surveillance ultrasound of the neck/thyroid in February 2016 was negative for thyroid remnants.  - whole-body scan with Thyrogen was negative for tumor recurrence on 07/05/2015.  -  Another surveillance  thyroid/neck ultrasound on 06/09/2016 is remarkable for surgically absent thyroid.  - on October 04, 2017 he underwent another surveillance Thyrogen stimulated  whole-body scan with negative findings for tumor recurrence or distant metastasis. -His previous labs show thyroglobulin level undetectable (< 2) -Previsit surveillance thyroid/neck ultrasound negative for  residual thyroid tissue or recurrence or lymphadenopathy.  -His recent Thyrogen stimulated whole-body scan is unremarkable and favorable for tumor remission.    2.  Type 2 diabetes: Recent diagnosis, worsening. -His point-of-care A1c is 6.6%, improving from 7.5%.  He is advised to continue metformin 1000 mg p.o. daily after breakfast and after supper.    - I had a long discussion with him about the progressive nature of diabetes and the pathology behind its complications. -His diabetes is complicated by obesity/sedentary life remains at a high risk for more acute and chronic complications which include CAD, CVA, CKD, retinopathy, and neuropathy. These are all discussed in detail with him.  - I have counseled him on diet management and weight loss, by adopting a carbohydrate restricted/protein rich diet.  - he acknowledges that there is a room for improvement in his food and drink choices. - Suggestion is made for him to avoid simple carbohydrates  from his diet including Cakes, Sweet Desserts, Ice Cream, Soda (diet and regular), Sweet Tea, Candies, Chips, Cookies, Store Bought Juices, Alcohol in Excess of  1-2 drinks a day, Artificial Sweeteners,  Coffee Creamer, and "Sugar-free" Products, Lemonade. This will help patient to have more stable blood glucose profile and potentially avoid unintended weight gain.   - I  encouraged him to switch to  unprocessed or minimally processed complex starch and increased protein intake (animal or plant source), fruits, and vegetables.  - He will be considered for incretin therapy as appropriate next visit.  - Patient specific target  A1c;  LDL, HDL, Triglycerides, and  Waist Circumference were discussed in detail.   - I advised patient to maintain close follow up with [name], FNP for primary care needs.   I spent 31 minutes in the care of the patient today including review of labs from Thyroid Function, CMP, and other relevant labs ; imaging/biopsy records (current and previous including abstractions from other facilities); face-to-face time discussing  his lab results and symptoms, medications doses, his options of short and long term treatment based on the latest standards of care / guidelines;   and documenting the encounter.  Leighton Ruff Deloria  participated in the discussions, expressed understanding, and voiced agreement with the above plans.  All questions were answered to his satisfaction. he is encouraged to contact clinic should he have any questions or concerns prior to his return visit.   Follow up plan: Return in about 6 months (around 09/22/2021) for F/U with Pre-visit Labs.  Glade Lloyd, MD Phone: [phone]  Fax: [phone]  This note was partially dictated with voice recognition software. Similar sounding words can be transcribed inadequately or may not  be corrected upon review.  03/24/2021, 6:12 PM

## 2021-03-24 NOTE — Patient Instructions (Signed)

## 2021-04-05 ENCOUNTER — Other Ambulatory Visit: Payer: Self-pay | Admitting: Family Medicine

## 2021-04-11 DIAGNOSIS — I428 Other cardiomyopathies: Secondary | ICD-10-CM | POA: Diagnosis not present

## 2021-04-11 DIAGNOSIS — R0681 Apnea, not elsewhere classified: Secondary | ICD-10-CM | POA: Diagnosis not present

## 2021-04-11 DIAGNOSIS — R29818 Other symptoms and signs involving the nervous system: Secondary | ICD-10-CM | POA: Diagnosis not present

## 2021-04-11 DIAGNOSIS — I10 Essential (primary) hypertension: Secondary | ICD-10-CM | POA: Diagnosis not present

## 2021-04-11 DIAGNOSIS — G4733 Obstructive sleep apnea (adult) (pediatric): Secondary | ICD-10-CM | POA: Diagnosis not present

## 2021-04-11 DIAGNOSIS — R0683 Snoring: Secondary | ICD-10-CM | POA: Diagnosis not present

## 2021-04-14 ENCOUNTER — Other Ambulatory Visit: Payer: BC Managed Care – PPO

## 2021-04-14 DIAGNOSIS — G7249 Other inflammatory and immune myopathies, not elsewhere classified: Secondary | ICD-10-CM | POA: Diagnosis not present

## 2021-04-14 DIAGNOSIS — M25562 Pain in left knee: Secondary | ICD-10-CM | POA: Diagnosis not present

## 2021-04-14 DIAGNOSIS — M25552 Pain in left hip: Secondary | ICD-10-CM | POA: Diagnosis not present

## 2021-04-14 DIAGNOSIS — B351 Tinea unguium: Secondary | ICD-10-CM | POA: Diagnosis not present

## 2021-04-14 DIAGNOSIS — M25551 Pain in right hip: Secondary | ICD-10-CM | POA: Diagnosis not present

## 2021-04-15 ENCOUNTER — Other Ambulatory Visit: Payer: Self-pay | Admitting: Family Medicine

## 2021-04-16 ENCOUNTER — Other Ambulatory Visit: Payer: Self-pay

## 2021-04-16 ENCOUNTER — Other Ambulatory Visit: Payer: Self-pay | Admitting: Orthopedic Surgery

## 2021-04-16 ENCOUNTER — Encounter: Payer: Self-pay | Admitting: Family Medicine

## 2021-04-16 ENCOUNTER — Ambulatory Visit: Payer: BC Managed Care – PPO | Admitting: Family Medicine

## 2021-04-16 ENCOUNTER — Other Ambulatory Visit (HOSPITAL_COMMUNITY): Payer: Self-pay | Admitting: Orthopedic Surgery

## 2021-04-16 VITALS — BP 114/74 | HR 90 | Temp 97.6°F | Ht 70.0 in | Wt 270.6 lb

## 2021-04-16 DIAGNOSIS — I10 Essential (primary) hypertension: Secondary | ICD-10-CM

## 2021-04-16 DIAGNOSIS — R0683 Snoring: Secondary | ICD-10-CM

## 2021-04-16 DIAGNOSIS — E890 Postprocedural hypothyroidism: Secondary | ICD-10-CM

## 2021-04-16 DIAGNOSIS — M109 Gout, unspecified: Secondary | ICD-10-CM

## 2021-04-16 DIAGNOSIS — M2550 Pain in unspecified joint: Secondary | ICD-10-CM

## 2021-04-16 DIAGNOSIS — E559 Vitamin D deficiency, unspecified: Secondary | ICD-10-CM | POA: Diagnosis not present

## 2021-04-16 DIAGNOSIS — M25551 Pain in right hip: Secondary | ICD-10-CM

## 2021-04-16 DIAGNOSIS — E1165 Type 2 diabetes mellitus with hyperglycemia: Secondary | ICD-10-CM

## 2021-04-16 DIAGNOSIS — R5382 Chronic fatigue, unspecified: Secondary | ICD-10-CM

## 2021-04-16 DIAGNOSIS — I428 Other cardiomyopathies: Secondary | ICD-10-CM

## 2021-04-16 MED ORDER — MELOXICAM 15 MG PO TABS: 15.0000 mg | ORAL_TABLET | Freq: Every day | ORAL | 1 refills | Status: DC

## 2021-04-17 ENCOUNTER — Encounter: Payer: Self-pay | Admitting: Family Medicine

## 2021-04-17 ENCOUNTER — Other Ambulatory Visit: Payer: Self-pay | Admitting: Family Medicine

## 2021-04-17 DIAGNOSIS — R7989 Other specified abnormal findings of blood chemistry: Secondary | ICD-10-CM | POA: Insufficient documentation

## 2021-04-17 HISTORY — DX: Other specified abnormal findings of blood chemistry: R79.89

## 2021-04-17 LAB — URIC ACID: Uric Acid: 8.8 mg/dL — ABNORMAL HIGH (ref 3.8–8.4)

## 2021-04-17 LAB — LIPID PANEL
Chol/HDL Ratio: 6.1 ratio — ABNORMAL HIGH (ref 0.0–5.0)
Cholesterol, Total: 177 mg/dL (ref 100–199)
HDL: 29 mg/dL — ABNORMAL LOW (ref >39)
LDL Chol Calc (NIH): 114 mg/dL — ABNORMAL HIGH (ref 0–99)
Triglycerides: 192 mg/dL — ABNORMAL HIGH (ref 0–149)
VLDL Cholesterol Cal: 34 mg/dL (ref 5–40)

## 2021-04-17 LAB — MICROALBUMIN / CREATININE URINE RATIO
Creatinine, Urine: 115.5 mg/dL
Microalb/Creat Ratio: 136 mg/g creat — ABNORMAL HIGH (ref 0–29)
Microalbumin, Urine: 157.5 ug/mL

## 2021-04-17 LAB — TESTOSTERONE,FREE AND TOTAL
Testosterone, Free: 1.4 pg/mL — ABNORMAL LOW (ref 6.8–21.5)
Testosterone: 77 ng/dL — ABNORMAL LOW (ref 264–916)

## 2021-04-17 LAB — VITAMIN D 25 HYDROXY (VIT D DEFICIENCY, FRACTURES): Vit D, 25-Hydroxy: 24.6 ng/mL — ABNORMAL LOW (ref 30.0–100.0)

## 2021-04-17 MED ORDER — PRAVASTATIN SODIUM 40 MG PO TABS: 40.0000 mg | ORAL_TABLET | Freq: Every day | ORAL | 2 refills | Status: DC

## 2021-04-18 ENCOUNTER — Encounter: Payer: Self-pay | Admitting: Family Medicine

## 2021-04-18 DIAGNOSIS — M1611 Unilateral primary osteoarthritis, right hip: Secondary | ICD-10-CM | POA: Diagnosis not present

## 2021-04-18 NOTE — Progress Notes (Signed)
Assessment & Plan:  1. Polyarthralgia Continue Meloxicam for pain control. Continue following with ortho. Keep upcoming appointment for MRI of right hip. - meloxicam (MOBIC) 15 MG tablet; Take 1 tablet (15 mg total) by mouth daily.  Dispense: 90 tablet; Refill: 1  2. Vitamin D deficiency Labs to assess. - VITAMIN D 25 Hydroxy (Vit-D Deficiency, Fractures)  3. Controlled gout Well controlled on current regimen.  - Uric acid  4. Type 2 diabetes mellitus with hyperglycemia, without long-term current use of insulin (HCC) WITH hypertension and hypothyroidism Lab Results  Component Value Date   HGBA1C 6.6 03/24/2021   HGBA1C 7.5 (A) 09/29/2019   HGBA1C 6.5 (H) 01/20/2019    - Diabetes is at goal of A1c < 7. - Managed by endocrinology. - Patient is currently taking a statin. Patient is taking an ACE-inhibitor/ARB.   Diabetes Health Maintenance Due  Topic Date Due   OPHTHALMOLOGY EXAM  Never done   HEMOGLOBIN A1C  09/22/2021   FOOT EXAM  Discontinued    Lab Results  Component Value Date   LABMICR 157.5 04/16/2021   MICROALBUR 29.5 02/12/2020   - Lipid panel - Testosterone,Free and Total - Microalbumin / creatinine urine ratio  5. Essential hypertension Well controlled on current regimen. Managed by cardiology. - sacubitril-valsartan (ENTRESTO) 49-51 MG; Take 1 tablet by mouth 2 (two) times daily. - Lipid panel - Testosterone,Free and Total  6. Nonischemic cardiomyopathy ([place]) Managed by cardiology.  - sacubitril-valsartan (ENTRESTO) 49-51 MG; Take 1 tablet by mouth 2 (two) times daily.  7. Postsurgical hypothyroidism Well controlled on current regimen. Managed by endocrinology.  8. Chronic fatigue - Testosterone,Free and Total  9. Snoring Patient is waiting on results from sleep study.   Return in about 6 months (around 10/15/2021) for annual physical.  Hendricks Limes, MSN, APRN, FNP-[facility]  Subjective:    Patient ID: Tyler [name], male    DOB: October 19, 1975, 45 y.o.   MRN: [id number]  Patient Care Team: [name], FNP as PCP - General (Family Medicine) Harlen Labs, MD as Referring Physician (Optometry)   Chief Complaint:  Chief Complaint  Patient presents with   Medication Refill    HPI: Tyler [name] is a 45 y.o. male presenting on 04/16/2021 for Medication Refill  Hypertension/Cardiomyopathy Patient is being managed by cardiologist, Dr. Geanie Berlin, who he last saw on 02/28/2021. He was started on Entresto and his metoprolol was increased at that visit. He also takes Imdur 30 mg daily. A cardiac echo was ordered and the plan is to refer to electrophysiologist for evaluation for defibrillator placement if there is still a significantly reduced heart function. Patient did have this echo completed on 03/19/2021 which showed an EF of 40-45%. Cardiology requested a lipid panel with his next lab work in our office. His next follow-up appointment is scheduled for 05/09/2021.   Diabetes/Postsurgical hypothyroidism Patient is being managed by endocrinologist, Dr. Dorris Fetch, who he last saw on 03/24/2021. His next follow-up appointment is scheduled for 09/26/2021.  His thyroid was removed on 03/03/2013 due to thyroid cancer. His levothyroxine dosage was recently decreased to 200 mcg daily.   His A1c has improved to 6.6 recently. He is taking metformin 1,000 mg BID.   Gout Patient reports he only has a flare every once in a while. He does have colchicine to use as needed.  Polyarthralgia Patient does see Dr. Gladstone Lighter at [facility] and states he was told he may need a hip replacement. He  has a pending right hip MRI for further assessment, which is scheduled for 04/18/2021. He is taking meloxicam for pain control and uses Flexeril PRN. Patient states he has lab work to be obtained today from his orthopedic.  Chronic fatigue Patient would like to have his testosterone level checked today.  Snoring Patient had a  sleep study completed on 04/11/2021. He does not yet have these results.  Vitamin D Deficiency Patient's last vitamin D level was 19 in August of last year. He was taking vitamin D3 5,000 units daily but states he has been out of this medication for at least a couple of months now.  New complaints: None   Social history:  Relevant past medical, surgical, family and social history reviewed and updated as indicated. Interim medical history since our last visit reviewed.  Allergies and medications reviewed and updated.  DATA REVIEWED: CHART IN EPIC  ROS: Negative unless specifically indicated above in HPI.    Current Outpatient Medications:    aspirin 81 MG EC tablet, Take 1 tablet by mouth daily., Disp: , Rfl:    colchicine 0.6 MG tablet, TAKE 2 TABLETS AT ONSET, THEN 1 TABLET 2 HOURS LATER- A MAXIMUM OF 3 TABLETS IN 24 HOURS, Disp: 270 tablet, Rfl: 0   isosorbide mononitrate (IMDUR) 30 MG 24 hr tablet, Take 30 mg by mouth 3 (three) times daily., Disp: , Rfl:    levothyroxine (SYNTHROID) 200 MCG tablet, TAKE 1 TABLET EVERY MORNING. HE HAS A SEPARATE RX FOR 25 MCG TO MAKE A TOTAL DAILY DOSE OF 225MCG., Disp: 90 tablet, Rfl: 1   metFORMIN (GLUCOPHAGE) 1000 MG tablet, Take 1 tablet (1,000 mg total) by mouth 2 (two) times daily with a meal., Disp: 180 tablet, Rfl: 1   metoprolol succinate (TOPROL-XL) 25 MG 24 hr tablet, Take 1 tablet by mouth every 12 (twelve) hours., Disp: , Rfl:    sacubitril-valsartan (ENTRESTO) 49-51 MG, Take 1 tablet by mouth 2 (two) times daily., Disp: , Rfl:    terbinafine (LAMISIL) 250 MG tablet, Take 1 tablet by mouth daily., Disp: , Rfl:    meloxicam (MOBIC) 15 MG tablet, Take 1 tablet (15 mg total) by mouth daily., Disp: 90 tablet, Rfl: 1   pravastatin (PRAVACHOL) 40 MG tablet, Take 1 tablet (40 mg total) by mouth daily., Disp: 30 tablet, Rfl: 2   Allergies  Allergen Reactions   Isosorbide Anaphylaxis    Extreme Headache   Past Medical History:  Diagnosis  Date   Cancer ([place])    thyroid   Gout    High cholesterol    Hypertension    Low testosterone 04/17/2021   Thyroid disease    thyroid cancer   Vitamin D insufficiency     Past Surgical History:  Procedure Laterality Date   EYE SURGERY Left    cataract removal   FRACTURE SURGERY Left 1989   ankle   THYROIDECTOMY N/A 03/03/2013   Procedure: TOTAL THYROIDECTOMY;  Surgeon: Jamesetta So, MD;  Location: [facility];  Service: General;  Laterality: N/A;    Social History   Socioeconomic History   Marital status: Single    Spouse name: Not on file   Number of children: Not on file   Years of education: Not on file   Highest education level: Not on file  Occupational History   Not on file  Tobacco Use   Smoking status: Some Days    Packs/day: 0.50    Years: 10.00    Pack years: 5.00  Types: Cigarettes    Last attempt to quit: 11/27/2016    Years since quitting: 4.3   Smokeless tobacco: Former  Scientific laboratory technician Use: Never used  Substance and Sexual Activity   Alcohol use: Yes    Comment: occasional   Drug use: No   Sexual activity: Yes    Birth control/protection: None  Other Topics Concern   Not on file  Social History Narrative   Not on file   Social Determinants of Health   Financial Resource Strain: Not on file  Food Insecurity: Not on file  Transportation Needs: Not on file  Physical Activity: Not on file  Stress: Not on file  Social Connections: Not on file  Intimate Partner Violence: Not on file        Objective:    BP 114/74   Pulse 90   Temp 97.6 F (36.4 C)   Ht 5\' 10"  (1.778 m)   Wt 270 lb 9.6 oz (122.7 kg)   SpO2 97%   BMI 38.83 kg/m   Wt Readings from Last 3 Encounters:  04/16/21 270 lb 9.6 oz (122.7 kg)  03/24/21 281 lb 12.8 oz (127.8 kg)  07/10/20 288 lb 6 oz (130.8 kg)    Physical Exam Vitals reviewed.  Constitutional:      General: He is not in acute distress.    Appearance: Normal appearance. He is morbidly obese. He is not  ill-appearing, toxic-appearing or diaphoretic.  HENT:     Head: Normocephalic and atraumatic.  Eyes:     General: No scleral icterus.       Right eye: No discharge.        Left eye: No discharge.     Conjunctiva/sclera: Conjunctivae normal.  Cardiovascular:     Rate and Rhythm: Normal rate and regular rhythm.     Heart sounds: Normal heart sounds. No murmur heard.   No friction rub. No gallop.  Pulmonary:     Effort: Pulmonary effort is normal. No respiratory distress.     Breath sounds: Normal breath sounds. No stridor. No wheezing, rhonchi or rales.  Musculoskeletal:        General: Normal range of motion.     Cervical back: Normal range of motion.  Skin:    General: Skin is warm and dry.  Neurological:     Mental Status: He is alert and oriented to person, place, and time. Mental status is at baseline.  Psychiatric:        Mood and Affect: Mood normal.        Behavior: Behavior normal.        Thought Content: Thought content normal.        Judgment: Judgment normal.    Lab Results  Component Value Date   TSH 96.227 (H) 03/14/2021   Lab Results  Component Value Date   WBC 11.7 (H) 01/07/2018   HGB 14.9 01/07/2018   HCT 45.4 01/07/2018   MCV 91 01/07/2018   PLT 261 01/07/2018   Lab Results  Component Value Date   NA 140 02/12/2020   K 4.5 02/12/2020   CO2 25 02/12/2020   GLUCOSE 120 02/12/2020   BUN 10 02/12/2020   CREATININE 0.99 02/12/2020   BILITOT 0.4 02/12/2020   ALKPHOS 89 03/19/2016   AST 22 02/12/2020   ALT 33 02/12/2020   PROT 6.7 02/12/2020   ALBUMIN 4.3 03/19/2016   CALCIUM 9.7 02/12/2020   Lab Results  Component Value Date   CHOL 177 04/16/2021  Lab Results  Component Value Date   HDL 29 (L) 04/16/2021   Lab Results  Component Value Date   LDLCALC 114 (H) 04/16/2021   Lab Results  Component Value Date   TRIG 192 (H) 04/16/2021   Lab Results  Component Value Date   CHOLHDL 6.1 (H) 04/16/2021   Lab Results  Component Value Date    HGBA1C 6.6 03/24/2021

## 2021-04-18 NOTE — Progress Notes (Signed)
done

## 2021-04-22 DIAGNOSIS — M25562 Pain in left knee: Secondary | ICD-10-CM | POA: Diagnosis not present

## 2021-04-22 DIAGNOSIS — M25552 Pain in left hip: Secondary | ICD-10-CM | POA: Diagnosis not present

## 2021-04-22 DIAGNOSIS — M25551 Pain in right hip: Secondary | ICD-10-CM | POA: Diagnosis not present

## 2021-04-23 DIAGNOSIS — M25551 Pain in right hip: Secondary | ICD-10-CM | POA: Diagnosis not present

## 2021-04-23 DIAGNOSIS — M25552 Pain in left hip: Secondary | ICD-10-CM | POA: Diagnosis not present

## 2021-04-30 DIAGNOSIS — M25551 Pain in right hip: Secondary | ICD-10-CM | POA: Diagnosis not present

## 2021-05-05 ENCOUNTER — Telehealth: Payer: Self-pay | Admitting: Family Medicine

## 2021-05-05 DIAGNOSIS — R7989 Other specified abnormal findings of blood chemistry: Secondary | ICD-10-CM

## 2021-05-05 NOTE — Telephone Encounter (Signed)
Results reviewed with patient, requests referral to urology in [facility]

## 2021-05-06 ENCOUNTER — Other Ambulatory Visit: Payer: Self-pay | Admitting: "Endocrinology

## 2021-05-06 DIAGNOSIS — C73 Malignant neoplasm of thyroid gland: Secondary | ICD-10-CM

## 2021-05-06 NOTE — Telephone Encounter (Signed)
Referral placed.

## 2021-05-09 DIAGNOSIS — R0789 Other chest pain: Secondary | ICD-10-CM | POA: Diagnosis not present

## 2021-05-09 DIAGNOSIS — R9431 Abnormal electrocardiogram [ECG] [EKG]: Secondary | ICD-10-CM | POA: Diagnosis not present

## 2021-05-09 DIAGNOSIS — I428 Other cardiomyopathies: Secondary | ICD-10-CM | POA: Diagnosis not present

## 2021-05-09 DIAGNOSIS — I10 Essential (primary) hypertension: Secondary | ICD-10-CM | POA: Diagnosis not present

## 2021-05-12 ENCOUNTER — Emergency Department (HOSPITAL_COMMUNITY)
Admission: EM | Admit: 2021-05-12 | Discharge: 2021-05-12 | Disposition: A | Discharge status: Home / Self Care | Payer: BC Managed Care – PPO | Attending: Emergency Medicine | Admitting: Emergency Medicine

## 2021-05-12 DIAGNOSIS — Z7984 Long term (current) use of oral hypoglycemic drugs: Secondary | ICD-10-CM | POA: Diagnosis not present

## 2021-05-12 DIAGNOSIS — I10 Essential (primary) hypertension: Secondary | ICD-10-CM | POA: Diagnosis not present

## 2021-05-12 DIAGNOSIS — Z7982 Long term (current) use of aspirin: Secondary | ICD-10-CM | POA: Diagnosis not present

## 2021-05-12 DIAGNOSIS — I160 Hypertensive urgency: Secondary | ICD-10-CM | POA: Diagnosis not present

## 2021-05-12 DIAGNOSIS — M25569 Pain in unspecified knee: Secondary | ICD-10-CM | POA: Insufficient documentation

## 2021-05-12 DIAGNOSIS — I2510 Atherosclerotic heart disease of native coronary artery without angina pectoris: Secondary | ICD-10-CM | POA: Diagnosis not present

## 2021-05-12 DIAGNOSIS — F17210 Nicotine dependence, cigarettes, uncomplicated: Secondary | ICD-10-CM | POA: Insufficient documentation

## 2021-05-12 DIAGNOSIS — Z79899 Other long term (current) drug therapy: Secondary | ICD-10-CM | POA: Diagnosis not present

## 2021-05-12 DIAGNOSIS — M25559 Pain in unspecified hip: Secondary | ICD-10-CM | POA: Diagnosis not present

## 2021-05-12 DIAGNOSIS — R519 Headache, unspecified: Secondary | ICD-10-CM | POA: Diagnosis not present

## 2021-05-12 DIAGNOSIS — E119 Type 2 diabetes mellitus without complications: Secondary | ICD-10-CM | POA: Diagnosis not present

## 2021-05-12 DIAGNOSIS — Z85850 Personal history of malignant neoplasm of thyroid: Secondary | ICD-10-CM | POA: Diagnosis not present

## 2021-05-12 LAB — BASIC METABOLIC PANEL
Anion gap: 9 (ref 5–15)
BUN: <5 mg/dL — ABNORMAL LOW (ref 6–20)
CO2: 27 mmol/L (ref 22–32)
Calcium: 8.8 mg/dL — ABNORMAL LOW (ref 8.9–10.3)
Chloride: 106 mmol/L (ref 98–111)
Creatinine, Ser: 0.91 mg/dL (ref 0.61–1.24)
GFR, Estimated: >60 mL/min (ref >60)
Glucose, Bld: 119 mg/dL — ABNORMAL HIGH (ref 70–99)
Potassium: 4.1 mmol/L (ref 3.5–5.1)
Sodium: 142 mmol/L (ref 135–145)

## 2021-05-12 LAB — URINALYSIS, ROUTINE W REFLEX MICROSCOPIC
Bacteria, UA: NONE SEEN
Bilirubin Urine: NEGATIVE
Glucose, UA: NEGATIVE mg/dL
Hgb urine dipstick: NEGATIVE
Ketones, ur: NEGATIVE mg/dL
Leukocytes,Ua: NEGATIVE
Nitrite: NEGATIVE
Protein, ur: 30 mg/dL — AB
Specific Gravity, Urine: 1.016 (ref 1.005–1.030)
pH: 5 (ref 5.0–8.0)

## 2021-05-12 LAB — CBC WITH DIFFERENTIAL/PLATELET
Abs Immature Granulocytes: 0.07 10*3/uL (ref 0.00–0.07)
Basophils Absolute: 0 10*3/uL (ref 0.0–0.1)
Basophils Relative: 0 %
Eosinophils Absolute: 0.1 10*3/uL (ref 0.0–0.5)
Eosinophils Relative: 2 %
HCT: 41.7 % (ref 39.0–52.0)
Hemoglobin: 13.7 g/dL (ref 13.0–17.0)
Immature Granulocytes: 1 %
Lymphocytes Relative: 44 %
Lymphs Abs: 2.9 10*3/uL (ref 0.7–4.0)
MCH: 30.2 pg (ref 26.0–34.0)
MCHC: 32.9 g/dL (ref 30.0–36.0)
MCV: 91.9 fL (ref 80.0–100.0)
Monocytes Absolute: 0.5 10*3/uL (ref 0.1–1.0)
Monocytes Relative: 7 %
Neutro Abs: 3.1 10*3/uL (ref 1.7–7.7)
Neutrophils Relative %: 46 %
Platelets: 230 10*3/uL (ref 150–400)
RBC: 4.54 MIL/uL (ref 4.22–5.81)
RDW: 12.3 % (ref 11.5–15.5)
WBC: 6.7 10*3/uL (ref 4.0–10.5)
nRBC: 0 % (ref 0.0–0.2)

## 2021-05-12 LAB — TROPONIN I (HIGH SENSITIVITY): Troponin I (High Sensitivity): 9 ng/L (ref <18)

## 2021-05-12 LAB — TSH: TSH: 2.407 u[IU]/mL (ref 0.350–4.500)

## 2021-05-12 MED ORDER — SACUBITRIL-VALSARTAN 49-51 MG PO TABS
1.5000 | ORAL_TABLET | Freq: Once | ORAL | Status: DC
  Filled 2021-05-12 (×2): qty 1.5

## 2021-05-12 MED ORDER — METOPROLOL SUCCINATE ER 25 MG PO TB24
25.0000 mg | ORAL_TABLET | Freq: Every day | ORAL | Status: DC
  Administered 2021-05-12: 25 mg via ORAL
  Filled 2021-05-12: qty 1

## 2021-05-12 NOTE — ED Provider Notes (Signed)
[facility] EMERGENCY DEPARTMENT Provider Note   CSN: [id number] Arrival date & time: 05/12/21  6010     History Chief Complaint  Patient presents with   Hypertension   Hip Pain   Knee Pain    Tyler [name] is a 45 y.o. male who presents with concern for poorly controlled hypertension at home as well as hip and knee pain.  Patient with known history of poorly controlled hypertension despite good compliance with medications at home.  Presents today due to concern that his blood pressures have not been improving after taking his morning antihypertensive medications.  He did not take any of his antihypertensives today.  He endorses headaches of around his head and feeling pressure in the front of his head that improved with Tylenol.  Endorses very infrequent episodes of transiently blurry vision that lasts for a few seconds at a time though he does not feel that these are associated with his headaches.  He denies any chest pain, palpitations, heat or cold sensitivity, nausea, vomiting, new numbness/tingling/weakness in the extremities, or any syncopal episodes.  I personally reviewed this patient's medical records.  He has history of malignant hypertension currently on Entresto 49/51 1-1/2 pills daily as well as metoprolol 25 mg on Imdur for chronic chest discomfort and for thyroxine 225 mcg daily.  History of thyroid cancer s/p resection and radiation, follows with endocrinology.  Additionally has nonischemic cardiomyopathy suspect to be secondary to familial etiology versus poorly controlled hypertension.   Echo in September of this year revealed ejection fraction 40 to 45%.  Coronary CTA July 2022 revealed mild to moderate CAD.  Patient does continue to smoke. Additionally has history of knee and hip pain, which appears chronic per chart review, follows with orthopedics for steroid injections and recently was given oral steroids. PCP with [facility], cardiologist wt [facility].  HPI      Past Medical History:  Diagnosis Date   Cardiomyopathy ([place])    Diabetes ([place])    Gout    High cholesterol    Hypertension    Low testosterone 04/17/2021   Malignant neoplasm of thyroid gland ([place]) 04/17/2016   Postsurgical hypothyroidism    thyroid cancer   Vitamin D insufficiency     Patient Active Problem List   Diagnosis Date Noted   Low testosterone 04/17/2021   Chronic fatigue 12/26/2020   Snoring 12/26/2020   Nonischemic cardiomyopathy ([place]) 07/02/2020   Morbid obesity ([place]) 05/28/2020   Vitamin D insufficiency 02/14/2020   Type 2 diabetes mellitus with hyperglycemia, without long-term current use of insulin ([place]) 09/29/2019   Polyarthralgia 07/28/2016   Controlled gout 06/07/2015   Essential hypertension 03/13/2015   Postsurgical hypothyroidism 02/09/2015   Tobacco abuse 12/21/2012    Past Surgical History:  Procedure Laterality Date   EYE SURGERY Left    cataract removal   FRACTURE SURGERY Left 1989   ankle   THYROIDECTOMY N/A 03/03/2013   Procedure: TOTAL THYROIDECTOMY;  Surgeon: Jamesetta So, MD;  Location: [facility];  Service: General;  Laterality: N/A;       Family History  Problem Relation Age of Onset   Hypertension Father    Hyperlipidemia Father     Social History   Tobacco Use   Smoking status: Some Days    Packs/day: 0.50    Years: 10.00    Pack years: 5.00    Types: Cigarettes    Last attempt to quit: 11/27/2016    Years since quitting: 4.4   Smokeless  tobacco: Former  Scientific laboratory technician Use: Never used  Substance Use Topics   Alcohol use: Yes    Comment: occasional   Drug use: No    Home Medications Prior to Admission medications   Medication Sig Start Date End Date Taking? Authorizing Provider  aspirin 81 MG EC tablet Take 1 tablet by mouth daily. 12/26/20   [provider]  colchicine 0.6 MG tablet TAKE 2 TABLETS AT ONSET, THEN 1 TABLET 2 HOURS LATER- A MAXIMUM OF 3 TABLETS IN 24 HOURS 05/21/20   Nida, Marella Chimes, MD  isosorbide mononitrate (IMDUR) 30 MG 24 hr tablet Take 30 mg by mouth 3 (three) times daily. 02/28/21   [provider]  levothyroxine (SYNTHROID) 200 MCG tablet TAKE 1 TABLET EVERY MORNING. HE HAS A SEPARATE RX FOR 25 MCG TO MAKE A TOTAL DAILY DOSE OF 225MCG. 03/24/21   Cassandria Anger, MD  meloxicam (MOBIC) 15 MG tablet Take 1 tablet (15 mg total) by mouth daily. 04/16/21   [name], FNP  metFORMIN (GLUCOPHAGE) 1000 MG tablet Take 1 tablet (1,000 mg total) by mouth 2 (two) times daily with a meal. 03/24/21   Nida, Marella Chimes, MD  metoprolol succinate (TOPROL-XL) 25 MG 24 hr tablet Take 1 tablet by mouth every 12 (twelve) hours. 03/03/21   [provider]  pravastatin (PRAVACHOL) 40 MG tablet Take 1 tablet (40 mg total) by mouth daily. 04/17/21   [name], FNP  predniSONE (DELTASONE) 10 MG tablet Take by mouth. 04/14/21   [provider]  sacubitril-valsartan (ENTRESTO) 49-51 MG Take 1 tablet by mouth 2 (two) times daily.    [provider]  terbinafine (LAMISIL) 250 MG tablet Take 1 tablet by mouth daily. 02/19/21   [provider]    Allergies    Isosorbide  Review of Systems   Review of Systems  Constitutional: Negative.   HENT: Negative.    Eyes:  Positive for visual disturbance. Negative for photophobia, pain, discharge, redness and itching.  Respiratory: Negative.    Cardiovascular: Negative.   Gastrointestinal: Negative.   Genitourinary: Negative.   Musculoskeletal: Negative.   Skin: Negative.   Neurological:  Positive for headaches.  Hematological: Negative.    Physical Exam Updated Vital Signs BP (!) 183/129 (BP Location: Right Arm)   Pulse 71   Temp 97.6 F (36.4 C) (Oral)   Resp 16   SpO2 99%   Physical Exam Vitals and nursing note reviewed.  Constitutional:      Appearance: He is not ill-appearing or toxic-appearing.  HENT:     Head: Normocephalic and atraumatic.     Nose: Nose normal. No  congestion.     Mouth/Throat:     Mouth: Mucous membranes are moist.     Pharynx: No oropharyngeal exudate or posterior oropharyngeal erythema.  Eyes:     General:        Right eye: No discharge.        Left eye: No discharge.     Extraocular Movements: Extraocular movements intact.     Conjunctiva/sclera: Conjunctivae normal.     Pupils: Pupils are equal, round, and reactive to light.  Cardiovascular:     Rate and Rhythm: Normal rate and regular rhythm.     Pulses: Normal pulses.     Heart sounds: Normal heart sounds. No murmur heard. Pulmonary:     Effort: Pulmonary effort is normal. No respiratory distress.     Breath sounds: Normal breath sounds. No  wheezing or rales.  Abdominal:     General: Bowel sounds are normal. There is no distension.     Palpations: Abdomen is soft.     Tenderness: There is no abdominal tenderness.  Musculoskeletal:        General: No deformity.     Cervical back: Neck supple.     Right lower leg: No edema.     Left lower leg: No edema.  Skin:    General: Skin is warm and dry.     Capillary Refill: Capillary refill takes less than 2 seconds.  Neurological:     General: No focal deficit present.     Mental Status: He is alert and oriented to person, place, and time. Mental status is at baseline.     GCS: GCS eye subscore is 4. GCS verbal subscore is 5. GCS motor subscore is 6.     Cranial Nerves: Cranial nerves 2-12 are intact.     Sensory: Sensation is intact.     Motor: Motor function is intact.     Gait: Gait is intact. Gait normal.  Psychiatric:        Mood and Affect: Mood normal.    ED Results / Procedures / Treatments   Labs (all labs ordered are listed, but only abnormal results are displayed) Labs Reviewed  BASIC METABOLIC PANEL - Abnormal; Notable for the following components:      Result Value   Glucose, Bld 119 (*)    BUN <5 (*)    Calcium 8.8 (*)    All other components within normal limits  URINALYSIS, ROUTINE W REFLEX  MICROSCOPIC - Abnormal; Notable for the following components:   Protein, ur 30 (*)    All other components within normal limits  TSH  CBC WITH DIFFERENTIAL/PLATELET  TROPONIN I (HIGH SENSITIVITY)    EKG EKG Interpretation  Date/Time:  Monday May 12 2021 09:22:15 EST Ventricular Rate:  69 PR Interval:  188 QRS Duration: 142 QT Interval:  412 QTC Calculation: 441 R Axis:   -34 Text Interpretation: Normal sinus rhythm Left axis deviation Left ventricular hypertrophy with QRS widening ( R in aVL , Cornell product , Romhilt-Estes ) Cannot rule out Septal infarct , age undetermined Abnormal ECG Confirmed by Aletta Edouard [phone]) on 05/12/2021 9:34:23 AM  Radiology No results found.  Procedures Procedures   Medications Ordered in ED Medications  sacubitril-valsartan (ENTRESTO) 49-51 mg per tablet (has no administration in time range)  metoprolol succinate (TOPROL-XL) 24 hr tablet 25 mg (25 mg Oral Given 05/12/21 1059)    ED Course  I have reviewed the triage vital signs and the nursing notes.  Pertinent labs & imaging results that were available during my care of the patient were reviewed by me and considered in my medical decision making (see chart for details).  Clinical Course as of 05/12/21 1113  Mon May 12, 2021  1026 Protein(!): 30 [JD]    Clinical Course User Index [JD] Ricarda Frame   MDM Rules/Calculators/A&P                         45 year old male presents with concern for hypertension poorly controlled at home despite compliance with his medications.  History of the same.  Differential diagnosis includes is not limited to asymptomatic hypertension, hypertensive urgency versus hypertensive emergency.  Hypertensive to 183/129 on intake.  Did not take antihypertensive prior to coming to the emergency department.  Vital signs otherwise normal.  Cardiopulmonary exam is normal, abdominal exam is benign.  Patient is neurologically intact without  focal deficit on neuro exam and is neurovascularly intact in all 4 extremities without edema.  CBC unremarkable, BMP with normal kidney function with creatinine of 0.91 near patient's baseline.  Troponin is negative, 9, TSH is normal, 2.4.  EKG with normal sinus rhythm without STEMI.  UA with some protein, 30.  Home doses of antihypertensives administered in ED. As patient has history of headaches with reported episodes of blurry vision, CT of the head was offered.  Patient declined stating that he had normal CT 3 months ago and would prefer to avoid radiation at this time.  He states that as the rest of his work-up is normal he does not want to proceed with CT of the head.  Given his headaches do not sound hypertensive in nature and sound more tension type, has normal neurologic exam and normal vision today, do not feel that this is not an appropriate decision.  Given reassuring work-up in the emergency department today, no further work-up is warranted in ER at this time.  Patient is experiencing hypertensive urgency without emergency.  Recommend he continues with newly adjusted doses of his medications per his cardiologist 72 hours ago.  Additionally recommend close outpatient follow-up.  Jesselee voiced understanding of his medical evaluation and treatment plan.  She was questions was answered to his expressed satisfaction.  Strict return precautions are given.  Patient is well-appearing, stable, and appropriate for discharge at this time.  This chart was dictated using voice recognition software, Dragon. Despite the best efforts of this provider to proofread and correct errors, errors may still occur which can change documentation meaning.  Final Clinical Impression(s) / ED Diagnoses Final diagnoses:  Hypertensive urgency    Rx / DC Orders ED Discharge Orders     None        Aura Dials 05/12/21 1113    Hayden Rasmussen, MD 05/13/21 1230

## 2021-05-12 NOTE — Discharge Instructions (Addendum)
You were seen in the ER today for your high blood pressure.  Your physical exam, vital signs, blood work, and EKG were very reassuring.  Your thyroid function is normal today, and there is no sign of damage to organs secondary to your high blood pressure.  Please continue take the medications as prescribed with the newly adjusted doses from this past week at your cardiologist.  Take your blood pressure first thing in the morning and then in the evening prior to bedtime and continue to follow-up with your cardiologist and primary care doctor as needed.  Return to the ED with any difficulty breathing, chest pain, nausea or vomiting does not stop, blurry or double vision, syncope, or any other new severe symptom.

## 2021-05-12 NOTE — ED Triage Notes (Signed)
Pt. Stated, Im all over the place with my BP and I have knee pain and hip pain for over a week. My BP cant get it down. They said my thyroid level was 96 was not right . The 2 Dr's cant get it right

## 2021-05-12 NOTE — ED Notes (Signed)
Discharge instructions reviewed with patient . Patient verbalized understanding of instructions. Follow-up care was reviewed. Patient ambulatory with steady gait. VSS upon discharge.

## 2021-05-12 NOTE — ED Notes (Signed)
PA at bedside.

## 2021-05-12 NOTE — ED Notes (Signed)
Pt ambulated to bathroom 

## 2021-05-12 NOTE — ED Notes (Signed)
Called lab to have troponin added onto previous collection. Per lab, they will add on troponin.

## 2021-06-02 DIAGNOSIS — M87859 Other osteonecrosis, unspecified femur: Secondary | ICD-10-CM | POA: Diagnosis not present

## 2021-06-02 DIAGNOSIS — M25551 Pain in right hip: Secondary | ICD-10-CM | POA: Diagnosis not present

## 2021-06-19 DIAGNOSIS — E291 Testicular hypofunction: Secondary | ICD-10-CM | POA: Diagnosis not present

## 2021-06-30 DIAGNOSIS — I2510 Atherosclerotic heart disease of native coronary artery without angina pectoris: Secondary | ICD-10-CM | POA: Insufficient documentation

## 2021-06-30 DIAGNOSIS — R9431 Abnormal electrocardiogram [ECG] [EKG]: Secondary | ICD-10-CM | POA: Diagnosis not present

## 2021-06-30 DIAGNOSIS — I10 Essential (primary) hypertension: Secondary | ICD-10-CM | POA: Diagnosis not present

## 2021-06-30 DIAGNOSIS — I428 Other cardiomyopathies: Secondary | ICD-10-CM | POA: Diagnosis not present

## 2021-07-04 ENCOUNTER — Encounter: Payer: Self-pay | Admitting: Family Medicine

## 2021-07-04 ENCOUNTER — Ambulatory Visit: Payer: BC Managed Care – PPO | Admitting: Family Medicine

## 2021-07-04 VITALS — BP 142/100 | HR 91 | Temp 97.9°F | Ht 70.0 in | Wt 275.4 lb

## 2021-07-04 DIAGNOSIS — Z01818 Encounter for other preprocedural examination: Secondary | ICD-10-CM | POA: Diagnosis not present

## 2021-07-04 DIAGNOSIS — I10 Essential (primary) hypertension: Secondary | ICD-10-CM | POA: Diagnosis not present

## 2021-07-04 LAB — URINALYSIS, ROUTINE W REFLEX MICROSCOPIC
Bilirubin, UA: NEGATIVE
Glucose, UA: NEGATIVE
Ketones, UA: NEGATIVE
Leukocytes,UA: NEGATIVE
Nitrite, UA: NEGATIVE
RBC, UA: NEGATIVE
Specific Gravity, UA: 1.025 (ref 1.005–1.030)
Urobilinogen, Ur: 0.2 mg/dL (ref 0.2–1.0)
pH, UA: 5.5 (ref 5.0–7.5)

## 2021-07-04 LAB — BAYER DCA HB A1C WAIVED: HB A1C (BAYER DCA - WAIVED): 6.3 % — ABNORMAL HIGH (ref 4.8–5.6)

## 2021-07-04 LAB — COAGUCHEK XS/INR WAIVED
INR: 1 (ref 0.9–1.1)
Prothrombin Time: 12.3 s

## 2021-07-04 NOTE — Progress Notes (Signed)
Pt is a 46 y.o. male who is here for preoperative clearance for right total hip arthroplasty with [facility] scheduled for 07/28/2021. He is accompanied by his fiance, who he is okay with being present.   1) High Risk Cardiac Conditions  1) Recent MI - No.  2) Decompensated Heart Failure - No.  3) Unstable angina - No.  4) Symptomatic arrythmia - No.  5) Sx Valvular Disease - No.  2) Intermediate Risk Factors - DM, CKD, CVA, CHF, CAD - Yes.    2) Functional Status - > 4 mets (Walk, run, climb stairs) Yes.  Rob Graybeal Activity Status Index: 7.99  3) Surgery Specific Risk - Intermediate  4) Further Noninvasive evaluation -   1) EKG - Yes.  (Recently completed with cardiology)   1) Hx of CVA, CAD, DM, CKD  2) Echo - No.   1) Worsening dyspnea   3) Stress Testing - Active Cardiac Disease - No.  (Deemed not necessary per cardiology)  5) Need for medical therapy - Beta Blocker, Statins indicated ? Yes.    PE: Vitals:   07/04/21 1505 07/04/21 1507  BP: (!) 151/109 (!) 142/100  Pulse: 91   Temp: 97.9 F (36.6 C)   SpO2: 95%    Physical Exam Vitals reviewed.  Constitutional:      General: He is not in acute distress.    Appearance: Normal appearance. He is obese. He is not ill-appearing, toxic-appearing or diaphoretic.  HENT:     Head: Normocephalic and atraumatic.     Right Ear: Tympanic membrane, ear canal and external ear normal. There is no impacted cerumen.     Left Ear: Tympanic membrane, ear canal and external ear normal. There is no impacted cerumen.     Nose: Nose normal. No congestion or rhinorrhea.     Mouth/Throat:     Mouth: Mucous membranes are moist.     Pharynx: Oropharynx is clear. No oropharyngeal exudate or posterior oropharyngeal erythema.  Eyes:     General: No scleral icterus.       Right eye: No discharge.        Left eye: No discharge.     Conjunctiva/sclera: Conjunctivae normal.     Pupils: Pupils are equal, round, and reactive to light.  Neck:      Vascular: No carotid bruit.  Cardiovascular:     Rate and Rhythm: Normal rate and regular rhythm.     Heart sounds: Normal heart sounds. No murmur heard.   No friction rub. No gallop.  Pulmonary:     Effort: Pulmonary effort is normal. No respiratory distress.     Breath sounds: Normal breath sounds. No stridor. No wheezing, rhonchi or rales.  Abdominal:     General: Abdomen is flat. Bowel sounds are normal. There is no distension.     Palpations: Abdomen is soft. There is no hepatomegaly, splenomegaly or mass.     Tenderness: There is no abdominal tenderness. There is no guarding or rebound.     Hernia: No hernia is present.  Musculoskeletal:        General: Normal range of motion.     Cervical back: Normal range of motion and neck supple. No rigidity. No muscular tenderness.     Right lower leg: No edema.     Left lower leg: No edema.  Lymphadenopathy:     Cervical: No cervical adenopathy.  Skin:    General: Skin is warm and dry.     Capillary Refill:  Capillary refill takes less than 2 seconds.  Neurological:     General: No focal deficit present.     Mental Status: He is alert and oriented to person, place, and time. Mental status is at baseline.  Psychiatric:        Mood and Affect: Mood normal.        Behavior: Behavior normal.        Thought Content: Thought content normal.        Judgment: Judgment normal.    1. Pre-op examination I have independently evaluated patient.  CHRISTON PARADA is a 46 y.o. male who is intermediate risk for an intermediate risk surgery.  There are modifiable risk factors which he is working on (hypertension). [facility] [place]'s RCRI/NSQIP calculation for MACE is: 6%. He has also seen cardiology for clearance. He will be cleared for surgery by me pending lab work.  - CBC with Differential/Platelet - CMP14+EGFR - Bayer DCA Hb A1c Waived - CoaguChek XS/INR Waived - Urinalysis, Routine w reflex microscopic  2. Essential hypertension Uncontrolled,  but improving. Patient was started on Entreso 49-51 mg by cardiology just three days ago after being without medication for five days. He and cardiology feel elevated blood pressure readings are related to his pain. His home readings have been improving over the past few days.    Hendricks Limes, MSN, APRN, FNP-C [place]

## 2021-07-05 LAB — CBC WITH DIFFERENTIAL/PLATELET
Basophils Absolute: 0 10*3/uL (ref 0.0–0.2)
Basos: 0 %
EOS (ABSOLUTE): 0.1 10*3/uL (ref 0.0–0.4)
Eos: 1 %
Hematocrit: 41.6 % (ref 37.5–51.0)
Hemoglobin: 14.2 g/dL (ref 13.0–17.7)
Immature Grans (Abs): 0.1 10*3/uL (ref 0.0–0.1)
Immature Granulocytes: 1 %
Lymphocytes Absolute: 3.7 10*3/uL — ABNORMAL HIGH (ref 0.7–3.1)
Lymphs: 40 %
MCH: 30.6 pg (ref 26.6–33.0)
MCHC: 34.1 g/dL (ref 31.5–35.7)
MCV: 90 fL (ref 79–97)
Monocytes Absolute: 0.6 10*3/uL (ref 0.1–0.9)
Monocytes: 7 %
Neutrophils Absolute: 4.7 10*3/uL (ref 1.4–7.0)
Neutrophils: 51 %
Platelets: 282 10*3/uL (ref 150–450)
RBC: 4.64 x10E6/uL (ref 4.14–5.80)
RDW: 13 % (ref 11.6–15.4)
WBC: 9.2 10*3/uL (ref 3.4–10.8)

## 2021-07-05 LAB — CMP14+EGFR
ALT: 25 IU/L (ref 0–44)
AST: 11 IU/L (ref 0–40)
Albumin/Globulin Ratio: 1.8 (ref 1.2–2.2)
Albumin: 4.4 g/dL (ref 4.0–5.0)
Alkaline Phosphatase: 105 IU/L (ref 44–121)
BUN/Creatinine Ratio: 8 — ABNORMAL LOW (ref 9–20)
BUN: 8 mg/dL (ref 6–24)
Bilirubin Total: 0.3 mg/dL (ref 0.0–1.2)
CO2: 26 mmol/L (ref 20–29)
Calcium: 9.7 mg/dL (ref 8.7–10.2)
Chloride: 108 mmol/L — ABNORMAL HIGH (ref 96–106)
Creatinine, Ser: 0.95 mg/dL (ref 0.76–1.27)
Globulin, Total: 2.5 g/dL (ref 1.5–4.5)
Glucose: 106 mg/dL — ABNORMAL HIGH (ref 70–99)
Potassium: 4.2 mmol/L (ref 3.5–5.2)
Sodium: 147 mmol/L — ABNORMAL HIGH (ref 134–144)
Total Protein: 6.9 g/dL (ref 6.0–8.5)
eGFR: 102 mL/min/{1.73_m2} (ref >59)

## 2021-07-07 ENCOUNTER — Encounter: Payer: Self-pay | Admitting: Family Medicine

## 2021-07-07 DIAGNOSIS — E291 Testicular hypofunction: Secondary | ICD-10-CM | POA: Diagnosis not present

## 2021-07-12 ENCOUNTER — Other Ambulatory Visit: Payer: Self-pay | Admitting: Family Medicine

## 2021-07-28 DIAGNOSIS — M1611 Unilateral primary osteoarthritis, right hip: Secondary | ICD-10-CM | POA: Diagnosis not present

## 2021-07-28 DIAGNOSIS — M87051 Idiopathic aseptic necrosis of right femur: Secondary | ICD-10-CM | POA: Diagnosis not present

## 2021-07-28 DIAGNOSIS — R5381 Other malaise: Secondary | ICD-10-CM | POA: Diagnosis not present

## 2021-07-28 DIAGNOSIS — R69 Illness, unspecified: Secondary | ICD-10-CM | POA: Diagnosis not present

## 2021-07-28 HISTORY — PX: TOTAL HIP ARTHROPLASTY: SHX124

## 2021-08-19 ENCOUNTER — Encounter: Payer: Self-pay | Admitting: Family Medicine

## 2021-09-10 ENCOUNTER — Other Ambulatory Visit: Payer: Self-pay | Admitting: "Endocrinology

## 2021-09-10 DIAGNOSIS — Z471 Aftercare following joint replacement surgery: Secondary | ICD-10-CM | POA: Diagnosis not present

## 2021-09-10 DIAGNOSIS — C73 Malignant neoplasm of thyroid gland: Secondary | ICD-10-CM

## 2021-09-10 DIAGNOSIS — Z96641 Presence of right artificial hip joint: Secondary | ICD-10-CM | POA: Diagnosis not present

## 2021-09-19 ENCOUNTER — Encounter: Payer: Self-pay | Admitting: Family Medicine

## 2021-09-19 ENCOUNTER — Ambulatory Visit (INDEPENDENT_AMBULATORY_CARE_PROVIDER_SITE_OTHER): Payer: BC Managed Care – PPO | Admitting: Family Medicine

## 2021-09-19 VITALS — BP 144/98 | HR 92 | Temp 98.2°F | Ht 70.0 in | Wt 274.6 lb

## 2021-09-19 DIAGNOSIS — G4730 Sleep apnea, unspecified: Secondary | ICD-10-CM

## 2021-09-19 DIAGNOSIS — Z0000 Encounter for general adult medical examination without abnormal findings: Secondary | ICD-10-CM

## 2021-09-19 DIAGNOSIS — E559 Vitamin D deficiency, unspecified: Secondary | ICD-10-CM

## 2021-09-19 DIAGNOSIS — M2550 Pain in unspecified joint: Secondary | ICD-10-CM

## 2021-09-19 DIAGNOSIS — R7989 Other specified abnormal findings of blood chemistry: Secondary | ICD-10-CM | POA: Diagnosis not present

## 2021-09-19 DIAGNOSIS — Z0001 Encounter for general adult medical examination with abnormal findings: Secondary | ICD-10-CM

## 2021-09-19 DIAGNOSIS — I10 Essential (primary) hypertension: Secondary | ICD-10-CM

## 2021-09-19 DIAGNOSIS — E1165 Type 2 diabetes mellitus with hyperglycemia: Secondary | ICD-10-CM

## 2021-09-19 DIAGNOSIS — M109 Gout, unspecified: Secondary | ICD-10-CM | POA: Diagnosis not present

## 2021-09-19 DIAGNOSIS — E890 Postprocedural hypothyroidism: Secondary | ICD-10-CM

## 2021-09-19 DIAGNOSIS — I428 Other cardiomyopathies: Secondary | ICD-10-CM

## 2021-09-19 MED ORDER — MELOXICAM 15 MG PO TABS: 15.0000 mg | ORAL_TABLET | Freq: Every day | ORAL | 1 refills | Status: DC

## 2021-09-19 NOTE — Progress Notes (Signed)
? ?Assessment & Plan:  ?1. Well adult exam ?Preventive health education provided. ?- Lipid panel; Future ?- CMP14+EGFR; Future ? ?2. Vitamin D insufficiency ?Labs to assess. ?- VITAMIN D 25 Hydroxy (Vit-D Deficiency, Fractures); Future ? ?3. Low testosterone ?Discussed we can do testosterone replacement when he is ready. He is going to start going to the gym. ? ?4. Controlled gout ?Controlled. Colchicine as needed. ? ?5. Polyarthralgia ?Well controlled on current regimen.  ?- meloxicam (MOBIC) 15 MG tablet; Take 1 tablet (15 mg total) by mouth daily.  Dispense: 90 tablet; Refill: 1 ? ?6. Essential hypertension ?Elevated today. Better home readings. Encouraged to follow-up with cardiology.  ?- Lipid panel; Future ?- CMP14+EGFR; Future ? ?7. Nonischemic cardiomyopathy ([place]) ?Managed by cardiology. ?- Lipid panel; Future ?- CMP14+EGFR; Future ? ?8. Type 2 diabetes mellitus with hyperglycemia, without long-term current use of insulin ([place]) ?Managed by diabetes. Diabetic eye exam requested from [place] ?- Lipid panel; Future ?- CMP14+EGFR; Future ? ?9. Postsurgical hypothyroidism ?Managed by diabetes. ? ?10. Mild sleep apnea ?CPAP not needed. ? ?11. Morbid obesity ([place]) ?Healthy eating and exercise encouraged.  ?- Lipid panel; Future ?- CMP14+EGFR; Future ? ? ?Follow-up: Return in about 6 months (around 03/21/2022) for follow-up of chronic medication conditions.  ? ?Hendricks Limes, MSN, APRN, FNP-C ?[place] ? ?Subjective:  ?Patient ID: Tyler [name], male    DOB: 06-28-1975  Age: 46 y.o. MRN: [id number] ? ?Patient Care Team: ?[name], FNP as PCP - General (Family Medicine) ?Harlen Labs, MD as Referring Physician (Optometry) ?Latanya Maudlin, MD as Consulting Physician (Orthopedic Surgery) ?Cassandria Anger, MD as Consulting Physician (Endocrinology) ?Marina Goodell, MD as Referring Physician (Cardiology)  ? ?CC:  ?Chief Complaint  ?Patient presents with  ?  Annual Exam  ? ? ?HPI ?Tyler [name] presents for his annual physical. ? ?Occupation: Lineman, Marital status: Engaged, Substance use: None ?Diet: Regular, Exercise: Just got released from recent hip surgery and is going to start going to the gym ?Last eye exam: last year ?Last dental exam: 2-3 years ago ?Hepatitis C Screening: declined ?Immunizations: Flu Vaccine: declined ?Tdap Vaccine: up to date  ?COVID-19 Vaccine:  declined booster ?Pneumonia Vaccine: declined ? ?DEPRESSION SCREENING ? ?  09/19/2021  ?  3:47 PM 07/04/2021  ?  3:09 PM 04/16/2021  ?  9:00 AM 01/11/2020  ?  8:36 AM 07/11/2018  ? 12:24 PM 02/17/2018  ?  3:55 PM 01/07/2018  ?  8:09 AM  ?PHQ 2/9 Scores  ?PHQ - 2 Score 0 0 0 0 0 0 0  ?PHQ- 9 Score 0 0       ?  ?Vitamin D Deficiency: not taking a supplement. Last level low in October 2022. ? ?Low Testosterone: Patient previously saw urology due to low testosterone levels. He reports these completed their work-up and discussed testosterone replacement with him. He did not wish to start treatment at that time due to discussed side effects. He was advised to start working out in the gym and that if he wanted to start replacement it could be done with his family provider or endocrinologist.  ? ?Gout: Patient reports he only has a flare every once in a while. He does have colchicine to use as needed. ? ?Diabetes/Hypothyroidism: managed by Dr. Dorris Fetch. ? ?Hypertension/Cardiomyopathy: Patient is being managed by cardiologist, Dr. Geanie Berlin, who he last saw on 06/30/2021. He was started on Entresto and his metoprolol was increased at that visit. He also  takes Imdur 30 mg daily. A cardiac echo was ordered and the plan is to refer to electrophysiologist for evaluation for defibrillator placement if there is still a significantly reduced heart function. Patient did have this echo completed on 03/19/2021 which showed an EF of 40-45%; at that time metoprolol was increased to 25 mg twice daily. His Entresto dosage has been  increased to 97/101 mg twice daily. Patient reports he does monitor his blood pressure at home and gets readings 120-lower 130s/85-92.  ? ?Sleep apnea: patient had a sleep study completed in 2022 which he reports revealed very mild sleep apnea that did not necessitate CPAP.  ? ? ?Review of Systems  ?Constitutional:  Positive for malaise/fatigue. Negative for chills, fever and weight loss.  ?HENT:  Negative for congestion, ear discharge, ear pain, nosebleeds, sinus pain, sore throat and tinnitus.   ?Eyes:  Negative for blurred vision, double vision, pain, discharge and redness.  ?Respiratory:  Negative for cough, shortness of breath and wheezing.   ?Cardiovascular:  Negative for chest pain, palpitations and leg swelling.  ?Gastrointestinal:  Negative for abdominal pain, constipation, diarrhea, heartburn, nausea and vomiting.  ?Genitourinary:  Negative for dysuria, frequency and urgency.  ?Musculoskeletal:  Negative for myalgias.  ?Skin:  Negative for rash.  ?Neurological:  Negative for dizziness, seizures, weakness and headaches.  ?Psychiatric/Behavioral:  Negative for depression, substance abuse and suicidal ideas. The patient is not nervous/anxious.   ? ? ?Current Outpatient Medications:  ?  aspirin 81 MG EC tablet, Take 1 tablet by mouth daily., Disp: , Rfl:  ?  colchicine 0.6 MG tablet, TAKE 2 TABLETS AT ONSET, THEN 1 TABLET 2 HOURS LATER- A MAXIMUM OF 3 TABLETS IN 24 HOURS, Disp: 270 tablet, Rfl: 0 ?  isosorbide mononitrate (IMDUR) 30 MG 24 hr tablet, Take 30 mg by mouth 3 (three) times daily., Disp: , Rfl:  ?  levothyroxine (SYNTHROID) 200 MCG tablet, TAKE 1 TABLET EVERY MORNING, Disp: 90 tablet, Rfl: 0 ?  meloxicam (MOBIC) 15 MG tablet, Take 1 tablet (15 mg total) by mouth daily., Disp: 90 tablet, Rfl: 1 ?  metFORMIN (GLUCOPHAGE) 1000 MG tablet, Take 1 tablet (1,000 mg total) by mouth 2 (two) times daily with a meal., Disp: 180 tablet, Rfl: 1 ?  metoprolol succinate (TOPROL-XL) 25 MG 24 hr tablet, Take 1  tablet by mouth every 12 (twelve) hours., Disp: , Rfl:  ?  pravastatin (PRAVACHOL) 40 MG tablet, TAKE 1 TABLET BY MOUTH EVERY DAY, Disp: 30 tablet, Rfl: 5 ?  sacubitril-valsartan (ENTRESTO) 49-51 MG, Take 1 tablet by mouth 2 (two) times daily., Disp: , Rfl:  ?  terbinafine (LAMISIL) 250 MG tablet, Take 1 tablet by mouth daily., Disp: , Rfl:  ? ?Allergies  ?Allergen Reactions  ? Isosorbide Anaphylaxis  ?  Extreme Headache  ? ? ?Past Medical History:  ?Diagnosis Date  ? Cardiomyopathy ([place])   ? Diabetes ([place])   ? Gout   ? High cholesterol   ? Hypertension   ? Low testosterone 04/17/2021  ? Malignant neoplasm of thyroid gland ([place]) 04/17/2016  ? Postsurgical hypothyroidism   ? thyroid cancer  ? Vitamin D insufficiency   ? ? ?Past Surgical History:  ?Procedure Laterality Date  ? EYE SURGERY Left   ? cataract removal  ? FRACTURE SURGERY Left 06/23/1987  ? ankle  ? THYROIDECTOMY N/A 03/03/2013  ? Procedure: TOTAL THYROIDECTOMY;  Surgeon: Jamesetta So, MD;  Location: [facility];  Service: General;  Laterality: N/A;  ? TOTAL HIP ARTHROPLASTY Right  07/28/2021  ? ? ?Family History  ?Problem Relation Age of Onset  ? Hypertension Father   ? Hyperlipidemia Father   ? ? ?Social History  ? ?Socioeconomic History  ? Marital status: Single  ?  Spouse name: Not on file  ? Number of children: Not on file  ? Years of education: Not on file  ? Highest education level: Not on file  ?Occupational History  ? Not on file  ?Tobacco Use  ? Smoking status: Former  ?  Packs/day: 0.50  ?  Years: 10.00  ?  Pack years: 5.00  ?  Types: Cigarettes  ?  Quit date: 11/27/2016  ?  Years since quitting: 4.8  ? Smokeless tobacco: Former  ?Vaping Use  ? Vaping Use: Never used  ?Substance and Sexual Activity  ? Alcohol use: Yes  ?  Comment: occasional  ? Drug use: No  ? Sexual activity: Yes  ?  Birth control/protection: None  ?Other Topics Concern  ? Not on file  ?Social History Narrative  ? Not on file  ? ?Social Determinants of Health  ? ?Financial Resource  Strain: Not on file  ?Food Insecurity: Not on file  ?Transportation Needs: Not on file  ?Physical Activity: Not on file  ?Stress: Not on file  ?Social Connections: Not on file  ?Intimate Partner Violence: Not o

## 2021-09-19 NOTE — Patient Instructions (Signed)
Schedule a follow-up with cardiology. ?

## 2021-09-23 LAB — T4, FREE: Free T4: 2.07 ng/dL — ABNORMAL HIGH (ref 0.82–1.77)

## 2021-09-23 LAB — TSH: TSH: 0.157 u[IU]/mL — ABNORMAL LOW (ref 0.450–4.500)

## 2021-09-26 ENCOUNTER — Encounter: Payer: Self-pay | Admitting: "Endocrinology

## 2021-09-26 ENCOUNTER — Ambulatory Visit: Payer: BC Managed Care – PPO | Admitting: "Endocrinology

## 2021-09-26 VITALS — BP 128/98 | HR 80 | Ht 70.0 in | Wt 273.0 lb

## 2021-09-26 DIAGNOSIS — C73 Malignant neoplasm of thyroid gland: Secondary | ICD-10-CM | POA: Diagnosis not present

## 2021-09-26 DIAGNOSIS — E119 Type 2 diabetes mellitus without complications: Secondary | ICD-10-CM

## 2021-09-26 DIAGNOSIS — E291 Testicular hypofunction: Secondary | ICD-10-CM

## 2021-09-26 DIAGNOSIS — E890 Postprocedural hypothyroidism: Secondary | ICD-10-CM

## 2021-09-26 DIAGNOSIS — E559 Vitamin D deficiency, unspecified: Secondary | ICD-10-CM | POA: Diagnosis not present

## 2021-09-26 DIAGNOSIS — I10 Essential (primary) hypertension: Secondary | ICD-10-CM

## 2021-09-26 MED ORDER — LEVOTHYROXINE SODIUM 175 MCG PO TABS: 175.0000 ug | ORAL_TABLET | Freq: Every day | ORAL | 1 refills | Status: DC

## 2021-09-26 NOTE — Patient Instructions (Signed)

## 2021-09-26 NOTE — Progress Notes (Signed)
?09/26/2021 ?   ? ?Endocrinology follow-up note ? ? ?Subjective:  ? ? Patient ID: Tyler [name], male    DOB: January 26, 1976, PCP [name], FNP ? ? ?Past Medical History:  ?Diagnosis Date  ? Cardiomyopathy ([place])   ? Diabetes ([place])   ? Gout   ? High cholesterol   ? Hypertension   ? Low testosterone 04/17/2021  ? Malignant neoplasm of thyroid gland ([place]) 04/17/2016  ? Postsurgical hypothyroidism   ? thyroid cancer  ? Vitamin D insufficiency   ? ?Past Surgical History:  ?Procedure Laterality Date  ? EYE SURGERY Left   ? cataract removal  ? FRACTURE SURGERY Left 06/23/1987  ? ankle  ? THYROIDECTOMY N/A 03/03/2013  ? Procedure: TOTAL THYROIDECTOMY;  Surgeon: Jamesetta So, MD;  Location: [facility];  Service: General;  Laterality: N/A;  ? TOTAL HIP ARTHROPLASTY Right 07/28/2021  ? ?Social History  ? ?Socioeconomic History  ? Marital status: Single  ?  Spouse name: Not on file  ? Number of children: Not on file  ? Years of education: Not on file  ? Highest education level: Not on file  ?Occupational History  ? Not on file  ?Tobacco Use  ? Smoking status: Every Day  ?  Packs/day: 0.50  ?  Years: 10.00  ?  Pack years: 5.00  ?  Types: Cigarettes  ?  Last attempt to quit: 11/27/2016  ?  Years since quitting: 4.8  ? Smokeless tobacco: Former  ?Vaping Use  ? Vaping Use: Never used  ?Substance and Sexual Activity  ? Alcohol use: Yes  ?  Comment: occasional  ? Drug use: No  ? Sexual activity: Yes  ?  Birth control/protection: None  ?Other Topics Concern  ? Not on file  ?Social History Narrative  ? Not on file  ? ?Social Determinants of Health  ? ?Financial Resource Strain: Not on file  ?Food Insecurity: Not on file  ?Transportation Needs: Not on file  ?Physical Activity: Not on file  ?Stress: Not on file  ?Social Connections: Not on file  ? ?Outpatient Encounter Medications as of 09/26/2021  ?Medication Sig  ? aspirin 81 MG EC tablet Take 1 tablet by mouth daily.  ? colchicine 0.6 MG tablet TAKE 2 TABLETS AT ONSET, THEN 1 TABLET 2  HOURS LATER- A MAXIMUM OF 3 TABLETS IN 24 HOURS  ? isosorbide mononitrate (IMDUR) 30 MG 24 hr tablet Take 30 mg by mouth 3 (three) times daily. (Patient not taking: Reported on 09/26/2021)  ? levothyroxine (SYNTHROID) 175 MCG tablet Take 1 tablet (175 mcg total) by mouth daily before breakfast. TAKE 1 TABLET EVERY MORNING  ? meloxicam (MOBIC) 15 MG tablet Take 1 tablet (15 mg total) by mouth daily.  ? metFORMIN (GLUCOPHAGE) 1000 MG tablet Take 1 tablet (1,000 mg total) by mouth 2 (two) times daily with a meal.  ? metoprolol succinate (TOPROL-XL) 25 MG 24 hr tablet Take 1 tablet by mouth every 12 (twelve) hours.  ? pravastatin (PRAVACHOL) 40 MG tablet TAKE 1 TABLET BY MOUTH EVERY DAY  ? sacubitril-valsartan (ENTRESTO) 49-51 MG Take 1 tablet by mouth 2 (two) times daily.  ? [DISCONTINUED] levothyroxine (SYNTHROID) 200 MCG tablet TAKE 1 TABLET EVERY MORNING  ? [DISCONTINUED] terbinafine (LAMISIL) 250 MG tablet Take 1 tablet by mouth daily.  ? ?No facility-administered encounter medications on file as of 09/26/2021.  ? ?ALLERGIES: ?Allergies  ?Allergen Reactions  ? Isosorbide Anaphylaxis  ?  Extreme Headache  ? ?VACCINATION STATUS: ?Immunization History  ?  Administered Date(s) Administered  ? Influenza-Unspecified 05/20/2000  ? Janssen (J&J) SARS-COV-2 Vaccination 04/03/2020  ? Td 04/08/1999  ? Tdap 05/05/2020  ? ? ?HPI ? ?46 yr old male with medical history as above . He underwent total thyroidectomy for thyroid cancer on 03/03/2013 by Dr. Arnoldo Morale at [facility] in [place].  ? 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 no lymph nodes were identified. ? He received his first Thyrogen stimulated I -131 thyroid remnant ablation on 05/05/13 same, with no evidence of distant metastasis. ?-Subsequent Thyrogen Stimulated  whole body scan in January 2017  was reported to be unremarkable for tumor recurrence/distant metastases.    ? ?-Recently, he underwent Thyrogen stimulated whole-body scan which was unremarkable. ?He presents with repeat thyroid function test consistent with slight over-replacement.  He is currently on levothyroxine 200 mcg p.o. daily before breakfast.   ? ?- He also has hypertension, type 2 diabetes, currently on metformin.  His previsit labs show A1c of 6.3%, remaining on metformin 1000 mg p.o. twice daily.   ? ?He mentioned the fact that he was told he has low testosterone at another facility.  He is concerned about testosterone side effect, not on testosterone supplement. ? ? ? ?Review of Systems ? ?Review of systems ? ?Constitutional: + Minimally fluctuating body weight,  current  Body mass index is 39.17 kg/m?. , no fatigue, no subjective hyperthermia, no subjective hypothermia ? ? ? ?Objective:  ?  ?BP (!) 128/98   Pulse 80   Ht '5\' 10"'  (1.778 m)   Wt 273 lb (123.8 kg)   BMI 39.17 kg/m?   ?Wt Readings from Last 3 Encounters:  ?09/26/21 273 lb (123.8 kg)  ?09/19/21 274 lb 9.6 oz (124.6 kg)  ?07/04/21 275 lb 6.4 oz (124.9 kg)  ?  ? ? ?Recent Results (from the past 2160 hour(s))  ?Bayer DCA Hb A1c Waived     Status: Abnormal  ? Collection Time: 07/04/21  3:48 PM  ?Result Value Ref Range  ? HB A1C (BAYER DCA - WAIVED) 6.3 (H) 4.8 - 5.6 %  ?  Comment:          Prediabetes: 5.7 - 6.4 ?         Diabetes: >6.4 ?         Glycemic control for adults with diabetes: <7.0 ?  ?CoaguChek XS/INR Waived     Status: None  ? Collection Time: 07/04/21  3:48 PM  ?Result Value Ref Range  ? INR 1.0 0.9 - 1.1  ? Prothrombin Time 12.3 sec  ?  Comment: Differences in reagents, instruments, and pre-analytical variables ?can affect prothrombin time results.  These factors should be ?considered when comparing different prothrombin time test methods. ?Please Note: This test should not be used to monitor persons on ?heparin therapy. ?  ?Urinalysis, Routine w reflex microscopic     Status: Abnormal  ? Collection Time: 07/04/21  3:48 PM   ?Result Value Ref Range  ? Specific Gravity, UA 1.025 1.005 - 1.030  ? pH, UA 5.5 5.0 - 7.5  ? Color, UA Yellow Yellow  ? Appearance Ur Clear Clear  ? Leukocytes,UA Negative Negative  ? Protein,UA 2+ (A) Negative/Trace  ? Glucose, UA Negative Negative  ? Ketones, UA Negative Negative  ? RBC, UA Negative Negative  ? Bilirubin, UA Negative Negative  ? Urobilinogen, Ur 0.2 0.2 - 1.0 mg/dL  ? Nitrite, UA Negative Negative  ?CBC with  Differential/Platelet     Status: Abnormal  ? Collection Time: 07/04/21  3:50 PM  ?Result Value Ref Range  ? WBC 9.2 3.4 - 10.8 x10E3/uL  ? RBC 4.64 4.14 - 5.80 x10E6/uL  ? Hemoglobin 14.2 13.0 - 17.7 g/dL  ? Hematocrit 41.6 37.5 - 51.0 %  ? MCV 90 79 - 97 fL  ? MCH 30.6 26.6 - 33.0 pg  ? MCHC 34.1 31.5 - 35.7 g/dL  ? RDW 13.0 11.6 - 15.4 %  ? Platelets 282 150 - 450 x10E3/uL  ? Neutrophils 51 Not Estab. %  ? Lymphs 40 Not Estab. %  ? Monocytes 7 Not Estab. %  ? Eos 1 Not Estab. %  ? Basos 0 Not Estab. %  ? Neutrophils Absolute 4.7 1.4 - 7.0 x10E3/uL  ? Lymphocytes Absolute 3.7 (H) 0.7 - 3.1 x10E3/uL  ? Monocytes Absolute 0.6 0.1 - 0.9 x10E3/uL  ? EOS (ABSOLUTE) 0.1 0.0 - 0.4 x10E3/uL  ? Basophils Absolute 0.0 0.0 - 0.2 x10E3/uL  ? Immature Granulocytes 1 Not Estab. %  ? Immature Grans (Abs) 0.1 0.0 - 0.1 x10E3/uL  ?CMP14+EGFR     Status: Abnormal  ? Collection Time: 07/04/21  3:50 PM  ?Result Value Ref Range  ? Glucose 106 (H) 70 - 99 mg/dL  ? BUN 8 6 - 24 mg/dL  ? Creatinine, Ser 0.95 0.76 - 1.27 mg/dL  ? eGFR 102 >59 mL/min/1.73  ? BUN/Creatinine Ratio 8 (L) 9 - 20  ? Sodium 147 (H) 134 - 144 mmol/L  ? Potassium 4.2 3.5 - 5.2 mmol/L  ? Chloride 108 (H) 96 - 106 mmol/L  ? CO2 26 20 - 29 mmol/L  ? Calcium 9.7 8.7 - 10.2 mg/dL  ? Total Protein 6.9 6.0 - 8.5 g/dL  ? Albumin 4.4 4.0 - 5.0 g/dL  ? Globulin, Total 2.5 1.5 - 4.5 g/dL  ? Albumin/Globulin Ratio 1.8 1.2 - 2.2  ? Bilirubin Total 0.3 0.0 - 1.2 mg/dL  ? Alkaline Phosphatase 105 44 - 121 IU/L  ? AST 11 0 - 40 IU/L  ? ALT 25 0 - 44 IU/L   ?TSH     Status: Abnormal  ? Collection Time: 09/22/21  1:21 PM  ?Result Value Ref Range  ? TSH 0.157 (L) 0.450 - 4.500 uIU/mL  ?T4, free     Status: Abnormal  ? Collection Time: 09/22/21  1:21 PM  ?Result Value Ref Range

## 2021-10-17 ENCOUNTER — Encounter: Payer: BC Managed Care – PPO | Admitting: Family Medicine

## 2021-11-21 ENCOUNTER — Other Ambulatory Visit: Payer: Self-pay | Admitting: "Endocrinology

## 2021-11-21 DIAGNOSIS — E119 Type 2 diabetes mellitus without complications: Secondary | ICD-10-CM

## 2021-12-06 ENCOUNTER — Other Ambulatory Visit: Payer: Self-pay | Admitting: "Endocrinology

## 2021-12-06 DIAGNOSIS — C73 Malignant neoplasm of thyroid gland: Secondary | ICD-10-CM

## 2021-12-08 MED ORDER — LEVOTHYROXINE SODIUM 175 MCG PO TABS: 175.0000 ug | ORAL_TABLET | Freq: Every day | ORAL | 1 refills | Status: DC

## 2022-01-05 ENCOUNTER — Other Ambulatory Visit: Payer: Self-pay | Admitting: Family Medicine

## 2022-01-05 ENCOUNTER — Other Ambulatory Visit: Payer: Self-pay | Admitting: "Endocrinology

## 2022-01-05 DIAGNOSIS — C73 Malignant neoplasm of thyroid gland: Secondary | ICD-10-CM

## 2022-01-06 MED ORDER — LEVOTHYROXINE SODIUM 175 MCG PO TABS: 175.0000 ug | ORAL_TABLET | Freq: Every day | ORAL | 1 refills | Status: DC

## 2022-01-12 ENCOUNTER — Telehealth: Payer: Self-pay

## 2022-01-12 NOTE — Chronic Care Management (AMB) (Signed)
  Care Coordination  Note  01/12/2022 Name: Tyler [name] MRN: [id number] DOB: 04-14-1976  Tyler [name] is a 46 y.o. year old male who is a primary care patient of [name], FNP. I reached out to Tyler [name] by phone today to offer care coordination services.      Mr. Mceuen was given information about Care Coordination services today including:  The Care Coordination services include support from the care team which includes your Nurse Coordinator, Clinical Social Worker, or Pharmacist.  The Care Coordination team is here to help remove barriers to the health concerns and goals most important to you. Care Coordination services are voluntary and the patient may decline or stop services at any time by request to their care team member.   Patient agreed to services and verbal consent obtained.   Follow up plan: Telephone appointment with care coordination team member scheduled for:01/16/2022  Noreene Larsson, [place], [place] [address] Direct Dial: [phone] [email or web address]

## 2022-01-15 DIAGNOSIS — I10 Essential (primary) hypertension: Secondary | ICD-10-CM | POA: Diagnosis not present

## 2022-01-15 DIAGNOSIS — R9431 Abnormal electrocardiogram [ECG] [EKG]: Secondary | ICD-10-CM | POA: Diagnosis not present

## 2022-01-15 DIAGNOSIS — I2510 Atherosclerotic heart disease of native coronary artery without angina pectoris: Secondary | ICD-10-CM | POA: Diagnosis not present

## 2022-01-15 DIAGNOSIS — I428 Other cardiomyopathies: Secondary | ICD-10-CM | POA: Diagnosis not present

## 2022-01-16 ENCOUNTER — Ambulatory Visit: Payer: Self-pay | Admitting: *Deleted

## 2022-01-16 NOTE — Patient Outreach (Signed)
   Care Management   Outreach Note  01/16/2022 Name: Tyler [name] MRN: [id number] DOB: [birth year]  An unsuccessful telephone outreach was attempted today. The patient was referred to the case management team for assistance with care management and care coordination.  Outreach to [phone] Unable to leave a voice message as the voice mail box is not activated/full  Follow Up Plan:  RN CM will attempt to reach patient again at a later time or within the next 7-10 business days  Chyenne Sobczak L. Lavina Hamman, RN, BSN, [place] Coordinator Office number [phone] Main [facility] number [phone] Fax number [phone]

## 2022-01-20 ENCOUNTER — Other Ambulatory Visit: Payer: Self-pay | Admitting: Family Medicine

## 2022-01-20 ENCOUNTER — Ambulatory Visit: Payer: Self-pay | Admitting: *Deleted

## 2022-01-20 DIAGNOSIS — M2550 Pain in unspecified joint: Secondary | ICD-10-CM

## 2022-01-20 NOTE — Patient Outreach (Addendum)
  Care Coordination   Initial Visit Note   01/20/2022 Name: Tyler [name] MRN: [id number] DOB: Jan 05, 1976  Tyler [name] is a 46 y.o. year old male who sees [name], FNP for primary care. I spoke with  Tyler [name] by phone today  What matters to the patients health and wellness today?  Maintaining pre diabetes stage with diet information, support + resources   Goals Addressed               This Visit's Progress     Patient Stated     Disease Progression Prevented or Minimized/Maintaining pre diabetes stage with diet information, support + resources [facility]) (pt-stated)   On track     Start date 01/20/22 Follow up 02/20/22 3:30 pm  Pt reports last HgA1c per Endocrinology was 6.3 (Dr Dorris Fetch)  Care Coordination Interventions: Discussed plans with patient for ongoing care management follow up and provided patient with direct contact information for care management team Assessed social determinant of health barriers Reviewed [facility] care coordination services to include RN CM, SW, pharmacy Discussed better food choices vs what not to eat Reviewed last HgA1c Sent EMMI education via e-mail listed in my chart on diabetes: nutrition and healthy eating, prediabetes,  and diabetes and diet        SDOH assessments and interventions completed:   Yes SDOH Interventions Today    Flowsheet Row Most Recent Value  SDOH Interventions   Food Insecurity Interventions Intervention Not Indicated  Financial Strain Interventions Intervention Not Indicated  Housing Interventions Intervention Not Indicated  Transportation Interventions Intervention Not Indicated       Care Coordination Interventions Activated:  Yes Care Coordination Interventions:  Yes, provided   Follow up plan: Follow up call scheduled for 02/20/22 3:30 pm  Encounter Outcome:  Pt. Visit Completed  Demaya Hardge L. Lavina Hamman, RN, BSN, [place] Coordinator Office number [phone] Main [facility] number [phone] Fax  number [phone]

## 2022-01-20 NOTE — Patient Instructions (Addendum)
Visit Information  Thank you for taking time to visit with me today. Please don't hesitate to contact me if I can be of assistance to you.   Following are the goals we discussed today:   Goals Addressed               This Visit's Progress     Patient Stated     Disease Progression Prevented or Minimized/Maintaining pre diabetes stage with diet information, support + resources [facility]) (pt-stated)   On track     Start date 01/20/22 Follow up 02/20/22 3:30 pm  Pt reports last HgA1c per Endocrinology was 6.3 (Dr Dorris Fetch)  Care Coordination Interventions: Discussed plans with patient for ongoing care management follow up and provided patient with direct contact information for care management team Assessed social determinant of health barriers Reviewed [facility] care coordination services to include RN CM, SW, pharmacy Discussed better food choices vs what not to eat Reviewed last HgA1c Sent EMMI education via e-mail listed in my chart on diabetes: nutrition and healthy eating, prediabetes,  and diabetes and diet        Our next appointment is by telephone on 02/20/22 Friday at 3:30 pm  Please call the care guide team at [phone] if you need to cancel or reschedule your appointment.   If you are experiencing a Mental Health or [place] or need someone to talk to, please call the [facility]: [phone]   Patient verbalizes understanding of instructions and care plan provided today and agrees to view in [place]. Active MyChart status and patient understanding of how to access instructions and care plan via MyChart confirmed with patient.     The patient has been provided with contact information for the care management team and has been advised to call with any health related questions or concerns.   Calil Amor L. Lavina Hamman, RN, BSN, [place] Coordinator Office number [phone] Main [facility] number [phone] Fax number [phone]

## 2022-01-22 ENCOUNTER — Telehealth: Payer: Self-pay | Admitting: Family Medicine

## 2022-01-22 DIAGNOSIS — E1165 Type 2 diabetes mellitus with hyperglycemia: Secondary | ICD-10-CM

## 2022-01-22 NOTE — Telephone Encounter (Signed)
Future order placed and pt is aware and will come into have A1C drawn.

## 2022-01-22 NOTE — Telephone Encounter (Signed)
That is fine 

## 2022-01-23 ENCOUNTER — Other Ambulatory Visit: Payer: BC Managed Care – PPO

## 2022-01-23 DIAGNOSIS — E1165 Type 2 diabetes mellitus with hyperglycemia: Secondary | ICD-10-CM

## 2022-01-23 LAB — BAYER DCA HB A1C WAIVED: HB A1C (BAYER DCA - WAIVED): 6.4 % — ABNORMAL HIGH (ref 4.8–5.6)

## 2022-01-26 ENCOUNTER — Encounter: Payer: Self-pay | Admitting: *Deleted

## 2022-02-20 ENCOUNTER — Ambulatory Visit: Payer: Self-pay | Admitting: *Deleted

## 2022-03-02 ENCOUNTER — Encounter: Payer: Self-pay | Admitting: *Deleted

## 2022-03-02 ENCOUNTER — Ambulatory Visit: Payer: Self-pay | Admitting: *Deleted

## 2022-03-02 NOTE — Patient Instructions (Signed)
Visit Information  Thank you for taking time to visit with me today. Please don't hesitate to contact me if I can be of assistance to you.   Following are the goals we discussed today:   Goals Addressed               This Visit's Progress     Patient Stated     Disease Progression Prevented or Minimized/Maintaining pre diabetes stage with diet information, support + resources [facility]) (pt-stated)   On track     Start date 01/20/22 Follow up 02/20/22 3:30 pm  Pt reports last HgA1c per Endocrinology was 6.3 (Dr Dorris Fetch)  Care Coordination Interventions: Discussed plans with patient for ongoing care management follow up and provided patient with direct contact information for care management team Assessed social determinant of health barriers Follow up with the patient Pending review of the education information sent. Patient reports his significant other reviews all my chart information. He will check for the education this up coming weekend.       manage hypotension ([facility]) (pt-stated)   On track     Care Coordination Interventions: Evaluation of current treatment plan related to hypotension and patient's adherence to plan as established by provider Advised patient to remain as hydrated as possible Discussed the importance of taking in fluids that is sweated out Discussed plans with patient for ongoing care management follow up and provided patient with direct contact information for care management team Assessed social determinant of health barriers Assessed for possible cause of hypotension Confirms with patient that electrolyte imbalance is a concern as he works outside in the heat Encouraged him to continue to monitor his BP and to follow his MD orders to hold medicine when BP <100/70        Our next appointment is by telephone on 03/10/22 at 1145  Please call the care guide team at [phone] if you need to cancel or reschedule your appointment.   If you are experiencing a Mental  Health or [place] or need someone to talk to, please call the Suicide and Crisis Lifeline: 988 call the [place] National Suicide Prevention Lifeline: 551-800-4019 or TTY: 817 841 1570 TTY (613) 824-6212) to talk to a trained counselor call 1-800-273-TALK (toll free, 24 hour hotline) call the [facility]: [phone] call 911   Patient verbalizes understanding of instructions and care plan provided today and agrees to view in [place]. Active MyChart status and patient understanding of how to access instructions and care plan via MyChart confirmed with patient.     The patient has been provided with contact information for the care management team and has been advised to call with any health related questions or concerns.   [place] Lavina Hamman, RN, BSN, [place] Coordinator Office number [phone]

## 2022-03-02 NOTE — Patient Outreach (Signed)
  Care Coordination   03/02/2022 Name: Tyler [name] MRN: [id number] DOB: 20-Aug-1975   Care Coordination Outreach Attempts:  An unsuccessful telephone outreach was attempted today to offer the patient information about available care coordination services as a benefit of their health plan.  Mail box full  Follow Up Plan:  Additional outreach attempts will be made to offer the patient care coordination information and services.   Encounter Outcome:  No Answer  Care Coordination Interventions Activated:  No   Care Coordination Interventions:  No, not indicated    SIG Viraj Liby L. Lavina Hamman, RN, BSN, [place] Coordinator Office number [phone]

## 2022-03-02 NOTE — Patient Outreach (Signed)
  Care Coordination   Follow Up Visit Note   03/02/2022 Name: Tyler [name] MRN: [id number] DOB: [birth year]  Tyler [name] is a 46 y.o. year old male who sees [name], FNP for primary care. I spoke with  Tyler [name] by phone today.  What matters to the patients health and wellness today?  Doing well except for low blood pressures- Holding medicines per doctor orders  He reports last week he had lows of 95/60-98/70 Now has returned to 120's/70-90's    Goals Addressed               This Visit's Progress     Patient Stated     Disease Progression Prevented or Minimized/Maintaining pre diabetes stage with diet information, support + resources [facility]) (pt-stated)   On track     Start date 01/20/22 Follow up 02/20/22 3:30 pm  Pt reports last HgA1c per Endocrinology was 6.3 (Dr Dorris Fetch)  Care Coordination Interventions: Discussed plans with patient for ongoing care management follow up and provided patient with direct contact information for care management team Assessed social determinant of health barriers Follow up with the patient Pending review of the education information sent. Patient reports his significant other reviews all my chart information. He will check for the education this up coming weekend.       manage hypotension ([facility]N) (pt-stated)   On track     Care Coordination Interventions: Evaluation of current treatment plan related to hypotension and patient's adherence to plan as established by provider Advised patient to remain as hydrated as possible Discussed the importance of taking in fluids that is sweated out Discussed plans with patient for ongoing care management follow up and provided patient with direct contact information for care management team Assessed social determinant of health barriers Assessed for possible cause of hypotension Confirms with patient that electrolyte imbalance is a concern as he works outside in the heat Encouraged him to continue to  monitor his BP and to follow his MD orders to hold medicine when BP <100/70        SDOH assessments and interventions completed:  No     Care Coordination Interventions Activated:  Yes  Care Coordination Interventions:  Yes, provided   Follow up plan: Follow up call scheduled for 03/10/22    Encounter Outcome:  Pt. Visit Completed   Kia Varnadore L. Lavina Hamman, RN, BSN, [place] Coordinator Office number [phone]

## 2022-03-10 ENCOUNTER — Ambulatory Visit: Payer: Self-pay | Admitting: *Deleted

## 2022-03-10 ENCOUNTER — Encounter: Payer: Self-pay | Admitting: *Deleted

## 2022-03-10 NOTE — Patient Outreach (Signed)
  Care Coordination   Follow Up Visit Note   03/10/2022 Name: Tyler [name] MRN: [id number] DOB: [birth year]  Tyler [name] is a 46 y.o. year old male who sees [name], FNP for primary care. I spoke with  Tyler [name] by phone today.  What matters to the patients health and wellness today?  Follow up on education information and hypertension  He reports his significant other reviewed the education information and does not have questions at this time He reports he is doing better since the weather has become cooler Denies any concerns today   Goals Addressed               This Visit's Progress     Patient Stated     Disease Progression Prevented or Minimized/Maintaining pre diabetes stage with diet information, support + resources [facility]) (pt-stated)   On track      Pt reports last HgA1c per Endocrinology was 6.3 (Dr Dorris Fetch)  Care Coordination Interventions: Discussed plans with patient for ongoing care management follow up and provided patient with direct contact information for care management team Assessed social determinant of health barriers Assessed for questions about education materials sent to him (none) Encouraged outreach to RN CM as needed . Reviewed upcoming office pcp visit  Sent further education via my chart on purine diet for gout and sleep apnea      manage hypotension ([facility]N) (pt-stated)   On track     Care Coordination Interventions: Discussed plans with patient for ongoing care management follow up and provided patient with direct contact information for care management team Assessed social determinant of health barriers Assessed for questions about education materials sent (none)  Encouraged him to continue to monitor his BP and to continue to stay hydrated Encouraged outreach to RN CM as needed  Reviewed upcoming 03/27/22 pcp office visit and new office provider        SDOH assessments and interventions completed:  No  SDOH Interventions Today     Flowsheet Row Most Recent Value  SDOH Interventions   Food Insecurity Interventions Intervention Not Indicated  Transportation Interventions Intervention Not Indicated  Utilities Interventions Intervention Not Indicated        Care Coordination Interventions Activated:  Yes  Care Coordination Interventions:  Yes, provided   Follow up plan: Follow up call scheduled for 05/08/22 1145    Encounter Outcome:  Pt. Visit Completed   Mitchell Epling L. Lavina Hamman, RN, BSN, [place] Coordinator Office number [phone]

## 2022-03-10 NOTE — Patient Instructions (Addendum)
Visit Information  Thank you for taking time to visit with me today. Please don't hesitate to contact me if I can be of assistance to you.   Following are the goals we discussed today:   Goals Addressed               This Visit's Progress     Patient Stated     Disease Progression Prevented or Minimized/Maintaining pre diabetes stage with diet information, support + resources [facility]) (pt-stated)   On track      Pt reports last HgA1c per Endocrinology was 6.3 (Dr Dorris Fetch)  Care Coordination Interventions: Discussed plans with patient for ongoing care management follow up and provided patient with direct contact information for care management team Assessed social determinant of health barriers Assessed for questions about education materials sent to him (none) Encouraged outreach to RN CM as needed . Reviewed upcoming office pcp visit  Sent further education via my chart on purine diet for gout and sleep apnea      manage hypotension ([facility]N) (pt-stated)   On track     Care Coordination Interventions: Discussed plans with patient for ongoing care management follow up and provided patient with direct contact information for care management team Assessed social determinant of health barriers Assessed for questions about education materials sent (none)  Encouraged him to continue to monitor his BP and to continue to stay hydrated Encouraged outreach to RN CM as needed  Reviewed upcoming 03/27/22 pcp office visit and new office provider        Our next appointment is by telephone on 05/08/22 at 1145  Please call the care guide team at [phone] if you need to cancel or reschedule your appointment.   If you are experiencing a Mental Health or [place] or need someone to talk to, please call the Suicide and Crisis Lifeline: 988 call the [place] National Suicide Prevention Lifeline: (534)364-8082 or TTY: 518-835-3590 TTY (407) 015-1512) to talk to a trained counselor call  1-800-273-TALK (toll free, 24 hour hotline) call the [facility]: [phone] call 911   Patient verbalizes understanding of instructions and care plan provided today and agrees to view in [place]. Active MyChart status and patient understanding of how to access instructions and care plan via MyChart confirmed with patient.     The patient has been provided with contact information for the care management team and has been advised to call with any health related questions or concerns.   [place] Lavina Hamman, RN, BSN, [place] Coordinator Office number [phone]

## 2022-03-16 DIAGNOSIS — M25552 Pain in left hip: Secondary | ICD-10-CM | POA: Diagnosis not present

## 2022-03-27 ENCOUNTER — Ambulatory Visit: Payer: BC Managed Care – PPO | Admitting: Nurse Practitioner

## 2022-03-27 ENCOUNTER — Ambulatory Visit: Payer: BC Managed Care – PPO | Admitting: Family Medicine

## 2022-03-27 ENCOUNTER — Encounter: Payer: Self-pay | Admitting: Nurse Practitioner

## 2022-03-27 VITALS — BP 148/100 | HR 102 | Temp 98.6°F | Ht 70.0 in | Wt 287.0 lb

## 2022-03-27 DIAGNOSIS — E890 Postprocedural hypothyroidism: Secondary | ICD-10-CM | POA: Diagnosis not present

## 2022-03-27 DIAGNOSIS — E1165 Type 2 diabetes mellitus with hyperglycemia: Secondary | ICD-10-CM

## 2022-03-27 DIAGNOSIS — R5382 Chronic fatigue, unspecified: Secondary | ICD-10-CM

## 2022-03-27 DIAGNOSIS — I10 Essential (primary) hypertension: Secondary | ICD-10-CM | POA: Diagnosis not present

## 2022-03-27 DIAGNOSIS — E559 Vitamin D deficiency, unspecified: Secondary | ICD-10-CM | POA: Diagnosis not present

## 2022-03-27 NOTE — Progress Notes (Signed)
Established Patient Office Visit  Subjective   Patient ID: Tyler [name], male    DOB: 1975/08/17  Age: 46 y.o. MRN: [id number]  Chief Complaint  Patient presents with   Annual Exam    HPI  Hypertension, follow-up  BP Readings from Last 3 Encounters:  03/27/22 (!) 148/100  02/27/22 95/62  09/26/21 (!) 128/98   Wt Readings from Last 3 Encounters:  03/27/22 287 lb (130.2 kg)  09/26/21 273 lb (123.8 kg)  09/19/21 274 lb 9.6 oz (124.6 kg)     He was last seen for hypertension 6 months ago.  BP at that visit was 128/98. Management since that visit includes Entresto 97-103 mg 1 tablet by mouth twice daily.Marland Kitchen  He reports good compliance with treatment. He is not having side effects.  He is following a Regular diet. He is not exercising. He does smoke.  Use of agents associated with hypertension: none.   Outside blood pressures are . Symptoms: No chest pain No chest pressure  No palpitations No syncope  No dyspnea No orthopnea  No paroxysmal nocturnal dyspnea No lower extremity edema   Pertinent labs Lab Results  Component Value Date   CHOL 177 04/16/2021   HDL 29 (L) 04/16/2021   LDLCALC 114 (H) 04/16/2021   TRIG 192 (H) 04/16/2021   CHOLHDL 6.1 (H) 04/16/2021   Lab Results  Component Value Date   NA 147 (H) 07/04/2021   K 4.2 07/04/2021   CREATININE 0.95 07/04/2021   EGFR 102 07/04/2021   GLUCOSE 106 (H) 07/04/2021   TSH 0.157 (L) 09/22/2021     The 10-year ASCVD risk score (Arnett DK, et al., 2019) is: 22%  The patient presents with history of type II diabetes mellitus without complications. Patient was diagnosed in 09/29/2019. Compliance with treatment has been good; the patient takes medication as directed , maintains a diabetic diet and an exercise regimen , follows up as directed , and is keeping a glucose diary patient did not bring the blood glucose diary to clinic today. Patient specifically denies associated symptoms, including blurred vision, fatigue,  polydipsia, polyphagia and polyuria . Patient denies hypoglycemia. In regard to preventative care, the patient performs foot self-exams daily and last ophthalmology exam was in 2022.     Thyroid: Patient presents for evaluation of hypothyroidism. Current symptoms include fatigue, weight gain. Patient denies anxiousness, tremulousness, palpitations, sweating   .   Patient Active Problem List   Diagnosis Date Noted   Coronary artery disease involving native coronary artery of native heart without angina pectoris 06/30/2021   Low testosterone 04/17/2021   Chronic fatigue 12/26/2020   Mild sleep apnea 12/26/2020   Nonischemic cardiomyopathy ([place]) 07/02/2020   Morbid obesity ([place]) 05/28/2020   Vitamin D insufficiency 02/14/2020   Type 2 diabetes mellitus with hyperglycemia, without long-term current use of insulin ([place]) 09/29/2019   Pain in finger of left hand 02/10/2019   Polyarthralgia 07/28/2016   Controlled gout 06/07/2015   Essential hypertension 03/13/2015   Postsurgical hypothyroidism 02/09/2015   Tobacco abuse 12/21/2012   Past Medical History:  Diagnosis Date   Cardiomyopathy ([place])    Diabetes ([place])    Gout    High cholesterol    Hypertension    Low testosterone 04/17/2021   Malignant neoplasm of thyroid gland ([place]) 04/17/2016   Postsurgical hypothyroidism    thyroid cancer   Vitamin D insufficiency    Past Surgical History:  Procedure Laterality Date   EYE SURGERY Left    cataract  removal   FRACTURE SURGERY Left 06/23/1987   ankle   THYROIDECTOMY N/A 03/03/2013   Procedure: TOTAL THYROIDECTOMY;  Surgeon: Jamesetta So, MD;  Location: [facility];  Service: General;  Laterality: N/A;   TOTAL HIP ARTHROPLASTY Right 07/28/2021   Social History   Tobacco Use   Smoking status: Every Day    Packs/day: 0.50    Years: 10.00    Total pack years: 5.00    Types: Cigarettes    Last attempt to quit: 11/27/2016    Years since quitting: 5.3   Smokeless tobacco: Former   Scientific laboratory technician Use: Never used  Substance Use Topics   Alcohol use: Yes    Comment: occasional   Drug use: No   Social History   Socioeconomic History   Marital status: Single    Spouse name: Not on file   Number of children: Not on file   Years of education: Not on file   Highest education level: Not on file  Occupational History   Not on file  Tobacco Use   Smoking status: Every Day    Packs/day: 0.50    Years: 10.00    Total pack years: 5.00    Types: Cigarettes    Last attempt to quit: 11/27/2016    Years since quitting: 5.3   Smokeless tobacco: Former  Scientific laboratory technician Use: Never used  Substance and Sexual Activity   Alcohol use: Yes    Comment: occasional   Drug use: No   Sexual activity: Yes    Birth control/protection: None  Other Topics Concern   Not on file  Social History Narrative   Works outside   Investment banker, operational of Radio broadcast assistant Strain: [place]  (01/20/2022)   Overall Financial Resource Strain (CARDIA)    Difficulty of Paying Living Expenses: Not hard at all  Food Insecurity: No Food Insecurity (03/10/2022)   Hunger Vital Sign    Worried About Running Out of Food in the Last Year: Never true    Ran Out of Food in the Last Year: Never true  Transportation Needs: No Transportation Needs (03/10/2022)   PRAPARE - Hydrologist (Medical): No    Lack of Transportation (Non-Medical): No  Physical Activity: Not on file  Stress: Not on file  Social Connections: Not on file  Intimate Partner Violence: Not At Risk (01/20/2022)   Humiliation, Afraid, Rape, and Kick questionnaire    Fear of Current or Ex-Partner: No    Emotionally Abused: No    Physically Abused: No    Sexually Abused: No   Family Status  Relation Name Status   Mother  Alive   Father  Alive   Family History  Problem Relation Age of Onset   Hypertension Father    Hyperlipidemia Father    Allergies  Allergen Reactions   Isosorbide  Anaphylaxis    Extreme Headache      Review of Systems  Constitutional: Negative.   HENT: Negative.    Respiratory: Negative.    Cardiovascular: Negative.   Gastrointestinal: Negative.   Genitourinary: Negative.   Musculoskeletal: Negative.   Skin: Negative.   Neurological: Negative.   All other systems reviewed and are negative.     Objective:     BP (!) 148/100   Pulse (!) 102   Temp 98.6 F (37 C)   Ht '5\' 10"'  (1.778 m)   Wt 287 lb (130.2 kg)   SpO2  93%   BMI 41.18 kg/m  BP Readings from Last 3 Encounters:  03/27/22 (!) 148/100  02/27/22 95/62  09/26/21 (!) 128/98   Wt Readings from Last 3 Encounters:  03/27/22 287 lb (130.2 kg)  09/26/21 273 lb (123.8 kg)  09/19/21 274 lb 9.6 oz (124.6 kg)      Physical Exam Vitals and nursing note reviewed.  Constitutional:      Appearance: Normal appearance. He is obese.  HENT:     Head: Normocephalic.     Right Ear: External ear normal.     Left Ear: External ear normal.     Nose: Nose normal.     Mouth/Throat:     Mouth: Mucous membranes are moist.     Pharynx: Oropharynx is clear.  Eyes:     Conjunctiva/sclera: Conjunctivae normal.  Cardiovascular:     Rate and Rhythm: Normal rate and regular rhythm.     Pulses: Normal pulses.     Heart sounds: Normal heart sounds.  Pulmonary:     Effort: Pulmonary effort is normal.     Breath sounds: Normal breath sounds.  Abdominal:     General: Bowel sounds are normal.  Musculoskeletal:        General: Normal range of motion.  Skin:    General: Skin is warm.     Findings: No erythema or rash.  Neurological:     General: No focal deficit present.     Mental Status: He is alert and oriented to person, place, and time.  Psychiatric:        Mood and Affect: Mood normal.        Behavior: Behavior normal.      No results found for any visits on 03/27/22.  Last CBC Lab Results  Component Value Date   WBC 9.2 07/04/2021   HGB 14.2 07/04/2021   HCT 41.6  07/04/2021   MCV 90 07/04/2021   MCH 30.6 07/04/2021   RDW 13.0 07/04/2021   PLT 282 33/35/4562   Last metabolic panel Lab Results  Component Value Date   GLUCOSE 106 (H) 07/04/2021   NA 147 (H) 07/04/2021   K 4.2 07/04/2021   CL 108 (H) 07/04/2021   CO2 26 07/04/2021   BUN 8 07/04/2021   CREATININE 0.95 07/04/2021   EGFR 102 07/04/2021   CALCIUM 9.7 07/04/2021   PROT 6.9 07/04/2021   ALBUMIN 4.4 07/04/2021   LABGLOB 2.5 07/04/2021   AGRATIO 1.8 07/04/2021   BILITOT 0.3 07/04/2021   ALKPHOS 105 07/04/2021   AST 11 07/04/2021   ALT 25 07/04/2021   ANIONGAP 9 05/12/2021   Last lipids Lab Results  Component Value Date   CHOL 177 04/16/2021   HDL 29 (L) 04/16/2021   LDLCALC 114 (H) 04/16/2021   TRIG 192 (H) 04/16/2021   CHOLHDL 6.1 (H) 04/16/2021   Last hemoglobin A1c Lab Results  Component Value Date   HGBA1C 6.4 (H) 01/23/2022   Last thyroid functions Lab Results  Component Value Date   TSH 0.157 (L) 09/22/2021   T4TOTAL 9.5 05/17/2013      The 10-year ASCVD risk score (Arnett DK, et al., 2019) is: 22%    Assessment & Plan:   Problem List Items Addressed This Visit       Cardiovascular and Mediastinum   Essential hypertension - Primary    Hypertension not well controlled.  Patient reports he just ate at [employer] before coming to clinic and this ultimately have driven up his blood pressure.  Patient currently is managed by cardiology and current medication is Entresto 97 to 103 mg by mouth twice daily.        Endocrine   Postsurgical hypothyroidism    TSH completed results pending.      Relevant Orders   Thyroid Panel With TSH   Type 2 diabetes mellitus with hyperglycemia, without long-term current use of insulin ([place])    Completed labs CBC, CMP, lipid panel results pending.  Encourage patient to continue diabetic diet, weight loss and exercise.      Relevant Orders   Bayer DCA Hb A1c Waived     Other   Vitamin D insufficiency   Relevant  Orders   VITAMIN D 25 Hydroxy (Vit-D Deficiency, Fractures)   Chronic fatigue   Relevant Orders   CBC with Differential/Platelet   CMP14+EGFR   Lipid panel    Return in about 6 months (around 09/26/2022) for chronic disease management.    Ivy Lynn, NP

## 2022-03-27 NOTE — Patient Instructions (Signed)
Hypertension, Adult ?Hypertension is another name for high blood pressure. High blood pressure forces your heart to work harder to pump blood. This can cause problems over time. ?There are two numbers in a blood pressure reading. There is a top number (systolic) over a bottom number (diastolic). It is best to have a blood pressure that is below 120/80. ?What are the causes? ?The cause of this condition is not known. Some other conditions can lead to high blood pressure. ?What increases the risk? ?Some lifestyle factors can make you more likely to develop high blood pressure: ?Smoking. ?Not getting enough exercise or physical activity. ?Being overweight. ?Having too much fat, sugar, calories, or salt (sodium) in your diet. ?Drinking too much alcohol. ?Other risk factors include: ?Having any of these conditions: ?Heart disease. ?Diabetes. ?High cholesterol. ?Kidney disease. ?Obstructive sleep apnea. ?Having a family history of high blood pressure and high cholesterol. ?Age. The risk increases with age. ?Stress. ?What are the signs or symptoms? ?High blood pressure may not cause symptoms. Very high blood pressure (hypertensive crisis) may cause: ?Headache. ?Fast or uneven heartbeats (palpitations). ?Shortness of breath. ?Nosebleed. ?Vomiting or feeling like you may vomit (nauseous). ?Changes in how you see. ?Very bad chest pain. ?Feeling dizzy. ?Seizures. ?How is this treated? ?This condition is treated by making healthy lifestyle changes, such as: ?Eating healthy foods. ?Exercising more. ?Drinking less alcohol. ?Your doctor may prescribe medicine if lifestyle changes do not help enough and if: ?Your top number is above 130. ?Your bottom number is above 80. ?Your personal target blood pressure may vary. ?Follow these instructions at home: ?Eating and drinking ? ?If told, follow the DASH eating plan. To follow this plan: ?Fill one half of your plate at each meal with fruits and vegetables. ?Fill one fourth of your plate  at each meal with whole grains. Whole grains include whole-wheat pasta, brown rice, and whole-grain bread. ?Eat or drink low-fat dairy products, such as skim milk or low-fat yogurt. ?Fill one fourth of your plate at each meal with low-fat (lean) proteins. Low-fat proteins include fish, chicken without skin, eggs, beans, and tofu. ?Avoid fatty meat, cured and processed meat, or chicken with skin. ?Avoid pre-made or processed food. ?Limit the amount of salt in your diet to less than 1,500 mg each day. ?Do not drink alcohol if: ?Your doctor tells you not to drink. ?You are pregnant, may be pregnant, or are planning to become pregnant. ?If you drink alcohol: ?Limit how much you have to: ?0-1 drink a day for women. ?0-2 drinks a day for men. ?Know how much alcohol is in your drink. In the [place] one drink equals one 12 oz bottle of beer (355 mL), one 5 oz glass of wine (148 mL), or one 1? oz glass of hard liquor (44 mL). ?Lifestyle ? ?Work with your doctor to stay at a healthy weight or to lose weight. Ask your doctor what the best weight is for you. ?Get at least 30 minutes of exercise that causes your heart to beat faster (aerobic exercise) most days of the week. This may include walking, swimming, or biking. ?Get at least 30 minutes of exercise that strengthens your muscles (resistance exercise) at least 3 days a week. This may include lifting weights or doing Pilates. ?Do not smoke or use any products that contain nicotine or tobacco. If you need help quitting, ask your doctor. ?Check your blood pressure at home as told by your doctor. ?Keep all follow-up visits. ?Medicines ?Take over-the-counter and prescription medicines   only as told by your doctor. Follow directions carefully. ?Do not skip doses of blood pressure medicine. The medicine does not work as well if you skip doses. Skipping doses also puts you at risk for problems. ?Ask your doctor about side effects or reactions to medicines that you should watch  for. ?Contact a doctor if: ?You think you are having a reaction to the medicine you are taking. ?You have headaches that keep coming back. ?You feel dizzy. ?You have swelling in your ankles. ?You have trouble with your vision. ?Get help right away if: ?You get a very bad headache. ?You start to feel mixed up (confused). ?You feel weak or numb. ?You feel faint. ?You have very bad pain in your: ?Chest. ?Belly (abdomen). ?You vomit more than once. ?You have trouble breathing. ?These symptoms may be an emergency. Get help right away. Call 911. ?Do not wait to see if the symptoms will go away. ?Do not drive yourself to the hospital. ?Summary ?Hypertension is another name for high blood pressure. ?High blood pressure forces your heart to work harder to pump blood. ?For most people, a normal blood pressure is less than 120/80. ?Making healthy choices can help lower blood pressure. If your blood pressure does not get lower with healthy choices, you may need to take medicine. ?This information is not intended to replace advice given to you by your health care provider. Make sure you discuss any questions you have with your health care provider. ?Document Revised: 03/27/2021 Document Reviewed: 03/27/2021 ?Elsevier Patient Education ? 2023 Elsevier Inc. ? ?

## 2022-03-27 NOTE — Assessment & Plan Note (Signed)
Hypertension not well controlled.  Patient reports he just ate at [employer] before coming to clinic and this ultimately have driven up his blood pressure.  Patient currently is managed by cardiology and current medication is Entresto 97 to 103 mg by mouth twice daily.

## 2022-03-27 NOTE — Assessment & Plan Note (Signed)
Completed labs CBC, CMP, lipid panel results pending.  Encourage patient to continue diabetic diet, weight loss and exercise.

## 2022-03-27 NOTE — Assessment & Plan Note (Signed)
TSH completed results pending.

## 2022-03-28 LAB — CBC WITH DIFFERENTIAL/PLATELET
Basophils Absolute: 0.1 10*3/uL (ref 0.0–0.2)
Basos: 1 %
EOS (ABSOLUTE): 0.3 10*3/uL (ref 0.0–0.4)
Eos: 3 %
Hematocrit: 40.3 % (ref 37.5–51.0)
Hemoglobin: 14 g/dL (ref 13.0–17.7)
Immature Grans (Abs): 0.1 10*3/uL (ref 0.0–0.1)
Immature Granulocytes: 1 %
Lymphocytes Absolute: 3.8 10*3/uL — ABNORMAL HIGH (ref 0.7–3.1)
Lymphs: 36 %
MCH: 31.9 pg (ref 26.6–33.0)
MCHC: 34.7 g/dL (ref 31.5–35.7)
MCV: 92 fL (ref 79–97)
Monocytes Absolute: 0.5 10*3/uL (ref 0.1–0.9)
Monocytes: 5 %
Neutrophils Absolute: 5.9 10*3/uL (ref 1.4–7.0)
Neutrophils: 54 %
Platelets: 266 10*3/uL (ref 150–450)
RBC: 4.39 x10E6/uL (ref 4.14–5.80)
RDW: 13.1 % (ref 11.6–15.4)
WBC: 10.8 10*3/uL (ref 3.4–10.8)

## 2022-03-28 LAB — CMP14+EGFR
ALT: 33 IU/L (ref 0–44)
AST: 19 IU/L (ref 0–40)
Albumin/Globulin Ratio: 1.6 (ref 1.2–2.2)
Albumin: 4.2 g/dL (ref 4.1–5.1)
Alkaline Phosphatase: 112 IU/L (ref 44–121)
BUN/Creatinine Ratio: 9 (ref 9–20)
BUN: 9 mg/dL (ref 6–24)
Bilirubin Total: <0.2 mg/dL (ref 0.0–1.2)
CO2: 21 mmol/L (ref 20–29)
Calcium: 9.8 mg/dL (ref 8.7–10.2)
Chloride: 101 mmol/L (ref 96–106)
Creatinine, Ser: 0.98 mg/dL (ref 0.76–1.27)
Globulin, Total: 2.6 g/dL (ref 1.5–4.5)
Glucose: 225 mg/dL — ABNORMAL HIGH (ref 70–99)
Potassium: 4.2 mmol/L (ref 3.5–5.2)
Sodium: 141 mmol/L (ref 134–144)
Total Protein: 6.8 g/dL (ref 6.0–8.5)
eGFR: 98 mL/min/{1.73_m2} (ref >59)

## 2022-03-28 LAB — THYROID PANEL WITH TSH
Free Thyroxine Index: 2.8 (ref 1.2–4.9)
T3 Uptake Ratio: 29 % (ref 24–39)
T4, Total: 9.5 ug/dL (ref 4.5–12.0)
TSH: 3.12 u[IU]/mL (ref 0.450–4.500)

## 2022-03-28 LAB — VITAMIN D 25 HYDROXY (VIT D DEFICIENCY, FRACTURES): Vit D, 25-Hydroxy: 32.2 ng/mL (ref 30.0–100.0)

## 2022-03-28 LAB — LIPID PANEL
Chol/HDL Ratio: 6.9 ratio — ABNORMAL HIGH (ref 0.0–5.0)
Cholesterol, Total: 159 mg/dL (ref 100–199)
HDL: 23 mg/dL — ABNORMAL LOW (ref >39)
LDL Chol Calc (NIH): 47 mg/dL (ref 0–99)
Triglycerides: 620 mg/dL (ref 0–149)
VLDL Cholesterol Cal: 89 mg/dL — ABNORMAL HIGH (ref 5–40)

## 2022-03-30 ENCOUNTER — Other Ambulatory Visit: Payer: Self-pay | Admitting: Nurse Practitioner

## 2022-03-30 DIAGNOSIS — E781 Pure hyperglyceridemia: Secondary | ICD-10-CM

## 2022-03-30 LAB — BAYER DCA HB A1C WAIVED: HB A1C (BAYER DCA - WAIVED): 7.3 % — ABNORMAL HIGH (ref 4.8–5.6)

## 2022-03-30 MED ORDER — OMEGA-3-ACID ETHYL ESTERS 1 G PO CAPS: 2.0000 g | ORAL_CAPSULE | Freq: Two times a day (BID) | ORAL | 0 refills | Status: DC

## 2022-04-03 ENCOUNTER — Ambulatory Visit: Payer: BC Managed Care – PPO | Admitting: "Endocrinology

## 2022-04-03 DIAGNOSIS — I2510 Atherosclerotic heart disease of native coronary artery without angina pectoris: Secondary | ICD-10-CM | POA: Diagnosis not present

## 2022-04-03 DIAGNOSIS — I428 Other cardiomyopathies: Secondary | ICD-10-CM | POA: Diagnosis not present

## 2022-04-03 DIAGNOSIS — I10 Essential (primary) hypertension: Secondary | ICD-10-CM | POA: Diagnosis not present

## 2022-04-03 DIAGNOSIS — R9431 Abnormal electrocardiogram [ECG] [EKG]: Secondary | ICD-10-CM | POA: Diagnosis not present

## 2022-04-07 ENCOUNTER — Ambulatory Visit: Payer: Self-pay | Admitting: *Deleted

## 2022-04-07 NOTE — Patient Outreach (Signed)
  Care Coordination   04/07/2022 Name: Tyler [name] MRN: [id number] DOB: May 27, 1976   Care Coordination Outreach Attempts:  An unsuccessful telephone outreach was attempted today to offer the patient information about available care coordination services as a benefit of their health plan.   Follow Up Plan:  Additional outreach attempts will be made to offer the patient care coordination information and services.   Encounter Outcome:  No Answer  Care Coordination Interventions Activated:  No   Care Coordination Interventions:  No, not indicated    Lennyn Bellanca L. Lavina Hamman, RN, BSN, [place] Coordinator Office number [phone]

## 2022-04-16 DIAGNOSIS — M25552 Pain in left hip: Secondary | ICD-10-CM | POA: Diagnosis not present

## 2022-04-16 DIAGNOSIS — M25531 Pain in right wrist: Secondary | ICD-10-CM | POA: Diagnosis not present

## 2022-04-20 ENCOUNTER — Other Ambulatory Visit: Payer: Self-pay | Admitting: Nurse Practitioner

## 2022-04-20 DIAGNOSIS — E781 Pure hyperglyceridemia: Secondary | ICD-10-CM

## 2022-04-21 ENCOUNTER — Ambulatory Visit: Payer: Self-pay | Admitting: *Deleted

## 2022-04-21 NOTE — Patient Outreach (Signed)
  Care Coordination   Follow Up Visit Note   05/07/2022 Name: Tyler [name] MRN: [id number] DOB: [birth year]  Tyler [name] is a 46 y.o. year old male who sees Tyler Lynn, NP for primary care. I spoke with  Tyler [name] by phone today.  What matters to the patients health and wellness today?  He reports he is doing well except he is planning to have an upcoming surgery after seeing his provider on 04/22/22    Goals Addressed               This Visit's Progress     Patient Stated     Disease Progression Prevented or Minimized/Maintaining pre diabetes stage with diet information, support + resources [facility]) (pt-stated)   On track      Pt reports last HgA1c per Endocrinology was 6.3 (Dr Dorris Fetch)  Care Coordination Interventions: Discussed plans with patient for ongoing care management follow up and provided patient with direct contact information for care management team Assessed social determinant of health barriers  Reviewed upcoming office pcp visit  Answered questions      manage hypotension ([facility]) (pt-stated)   On track     Care Coordination Interventions: Discussed plans with patient for ongoing care management follow up and provided patient with direct contact information for care management team Assessed social determinant of health barriers Assessed for questions about education materials sent (none)  Encouraged him to continue to monitor his BP and to continue to stay hydrated Encouraged outreach to RN CM as needed          SDOH assessments and interventions completed:  Yes     Care Coordination Interventions Activated:  Yes  Care Coordination Interventions:  Yes, provided   Follow up plan: Follow up call scheduled for 05/21/22    Encounter Outcome:  Pt. Visit Completed    Tyler [name]. Tyler Hamman, RN, BSN, [place] Coordinator Office number [phone]

## 2022-04-22 DIAGNOSIS — M87852 Other osteonecrosis, left femur: Secondary | ICD-10-CM | POA: Diagnosis not present

## 2022-04-23 DIAGNOSIS — E119 Type 2 diabetes mellitus without complications: Secondary | ICD-10-CM | POA: Diagnosis not present

## 2022-04-23 DIAGNOSIS — E291 Testicular hypofunction: Secondary | ICD-10-CM | POA: Diagnosis not present

## 2022-04-23 DIAGNOSIS — E890 Postprocedural hypothyroidism: Secondary | ICD-10-CM | POA: Diagnosis not present

## 2022-04-30 DIAGNOSIS — M13831 Other specified arthritis, right wrist: Secondary | ICD-10-CM | POA: Diagnosis not present

## 2022-04-30 DIAGNOSIS — M19031 Primary osteoarthritis, right wrist: Secondary | ICD-10-CM | POA: Insufficient documentation

## 2022-05-02 LAB — LIPID PANEL
Chol/HDL Ratio: 5.5 ratio — ABNORMAL HIGH (ref 0.0–5.0)
Cholesterol, Total: 194 mg/dL (ref 100–199)
HDL: 35 mg/dL — ABNORMAL LOW (ref >39)
LDL Chol Calc (NIH): 132 mg/dL — ABNORMAL HIGH (ref 0–99)
Triglycerides: 148 mg/dL (ref 0–149)
VLDL Cholesterol Cal: 27 mg/dL (ref 5–40)

## 2022-05-02 LAB — TSH: TSH: 5.46 u[IU]/mL — ABNORMAL HIGH (ref 0.450–4.500)

## 2022-05-02 LAB — COMPREHENSIVE METABOLIC PANEL
ALT: 21 IU/L (ref 0–44)
AST: 14 IU/L (ref 0–40)
Albumin/Globulin Ratio: 1.3 (ref 1.2–2.2)
Albumin: 4.1 g/dL (ref 4.1–5.1)
Alkaline Phosphatase: 119 IU/L (ref 44–121)
BUN/Creatinine Ratio: 11 (ref 9–20)
BUN: 11 mg/dL (ref 6–24)
Bilirubin Total: <0.2 mg/dL (ref 0.0–1.2)
CO2: 21 mmol/L (ref 20–29)
Calcium: 10.3 mg/dL — ABNORMAL HIGH (ref 8.7–10.2)
Chloride: 101 mmol/L (ref 96–106)
Creatinine, Ser: 0.97 mg/dL (ref 0.76–1.27)
Globulin, Total: 3.2 g/dL (ref 1.5–4.5)
Glucose: 245 mg/dL — ABNORMAL HIGH (ref 70–99)
Potassium: 4.6 mmol/L (ref 3.5–5.2)
Sodium: 132 mmol/L — ABNORMAL LOW (ref 134–144)
Total Protein: 7.3 g/dL (ref 6.0–8.5)
eGFR: 99 mL/min/{1.73_m2} (ref >59)

## 2022-05-02 LAB — FOLLICLE STIMULATING HORMONE: FSH: 3.4 m[IU]/mL (ref 1.5–12.4)

## 2022-05-02 LAB — PROLACTIN: Prolactin: 11.6 ng/mL (ref 4.0–15.2)

## 2022-05-02 LAB — PSA: Prostate Specific Ag, Serum: 0.4 ng/mL (ref 0.0–4.0)

## 2022-05-02 LAB — TESTOSTERONE, FREE, TOTAL, SHBG
Sex Hormone Binding: 19.9 nmol/L (ref 16.5–55.9)
Testosterone, Free: 2.5 pg/mL — ABNORMAL LOW (ref 6.8–21.5)
Testosterone: 144 ng/dL — ABNORMAL LOW (ref 264–916)

## 2022-05-02 LAB — LUTEINIZING HORMONE: LH: 6.7 m[IU]/mL (ref 1.7–8.6)

## 2022-05-02 LAB — FERRITIN: Ferritin: 200 ng/mL (ref 30–400)

## 2022-05-02 LAB — T4, FREE: Free T4: 1.34 ng/dL (ref 0.82–1.77)

## 2022-05-02 LAB — THYROGLOBULIN LEVEL: Thyroglobulin (TG-RIA): <2 ng/mL

## 2022-05-07 NOTE — Patient Instructions (Signed)
Visit Information  Thank you for taking time to visit with me today. Please don't hesitate to contact me if I can be of assistance to you.   Following are the goals we discussed today:   Goals Addressed               This Visit's Progress     Patient Stated     Disease Progression Prevented or Minimized/Maintaining pre diabetes stage with diet information, support + resources [facility]) (pt-stated)   On track      Pt reports last HgA1c per Endocrinology was 6.3 (Dr Dorris Fetch)  Care Coordination Interventions: Discussed plans with patient for ongoing care management follow up and provided patient with direct contact information for care management team Assessed social determinant of health barriers  Reviewed upcoming office pcp visit  Answered questions      manage hypotension ([facility]) (pt-stated)   On track     Care Coordination Interventions: Discussed plans with patient for ongoing care management follow up and provided patient with direct contact information for care management team Assessed social determinant of health barriers Assessed for questions about education materials sent (none)  Encouraged him to continue to monitor his BP and to continue to stay hydrated Encouraged outreach to RN CM as needed          Our next appointment is by telephone on 05/21/22 at 2:30 pm  Please call the care guide team at [phone] if you need to cancel or reschedule your appointment.   If you are experiencing a Mental Health or [place] or need someone to talk to, please call the Suicide and Crisis Lifeline: 988 call the [place] National Suicide Prevention Lifeline: 201-455-3738 or TTY: (940)604-7016 TTY (618)418-8040) to talk to a trained counselor call 1-800-273-TALK (toll free, 24 hour hotline) call the [facility]: [phone] call 911   Patient verbalizes understanding of instructions and care plan provided today and agrees to view in [place]. Active  MyChart status and patient understanding of how to access instructions and care plan via MyChart confirmed with patient.     The patient has been provided with contact information for the care management team and has been advised to call with any health related questions or concerns.   Deajah Erkkila L. Lavina Hamman, RN, BSN, [place] Coordinator Office number [phone]

## 2022-05-10 DIAGNOSIS — M25552 Pain in left hip: Secondary | ICD-10-CM | POA: Diagnosis not present

## 2022-05-10 DIAGNOSIS — M25531 Pain in right wrist: Secondary | ICD-10-CM | POA: Diagnosis not present

## 2022-05-13 ENCOUNTER — Encounter: Payer: Self-pay | Admitting: Nurse Practitioner

## 2022-05-13 ENCOUNTER — Ambulatory Visit: Payer: BC Managed Care – PPO | Admitting: Nurse Practitioner

## 2022-05-13 VITALS — BP 142/96 | HR 88 | Temp 98.6°F | Ht 70.0 in | Wt 284.0 lb

## 2022-05-13 DIAGNOSIS — M25552 Pain in left hip: Secondary | ICD-10-CM | POA: Diagnosis not present

## 2022-05-13 NOTE — Progress Notes (Signed)
Established Patient Office Visit  Subjective   Patient ID: Tyler [name], male    DOB: 07/04/1975  Age: 46 y.o. MRN: [id number]  Chief Complaint  Patient presents with   Procedure   Hip Pain   surgical clearance    Hip Pain  The incident occurred more than 1 week ago. The incident occurred at home. There was no injury mechanism. The pain is present in the left hip. The pain is at a severity of 8/10. The pain is severe. The pain has been Constant since onset. Pertinent negatives include no numbness or tingling. He reports no foreign bodies present. The symptoms are aggravated by movement and palpation. He has tried ice, acetaminophen and NSAIDs for the symptoms. The treatment provided moderate relief.     Patient presents for surgical clearance assessment.  Assessment completed head to toe exam, labs and medication reconciliation completed.   Patient Active Problem List   Diagnosis Date Noted   Coronary artery disease involving native coronary artery of native heart without angina pectoris 06/30/2021   Low testosterone 04/17/2021   Chronic fatigue 12/26/2020   Mild sleep apnea 12/26/2020   Nonischemic cardiomyopathy ([place]) 07/02/2020   Morbid obesity ([place]) 05/28/2020   Vitamin D insufficiency 02/14/2020   Type 2 diabetes mellitus with hyperglycemia, without long-term current use of insulin ([place]) 09/29/2019   Pain in finger of left hand 02/10/2019   Polyarthralgia 07/28/2016   Controlled gout 06/07/2015   Essential hypertension 03/13/2015   Postsurgical hypothyroidism 02/09/2015   Tobacco abuse 12/21/2012   Past Medical History:  Diagnosis Date   Cardiomyopathy ([place])    Diabetes ([place])    Gout    High cholesterol    Hypertension    Low testosterone 04/17/2021   Malignant neoplasm of thyroid gland ([place]) 04/17/2016   Postsurgical hypothyroidism    thyroid cancer   Vitamin D insufficiency    Past Surgical History:  Procedure Laterality Date   EYE SURGERY Left     cataract removal   FRACTURE SURGERY Left 06/23/1987   ankle   THYROIDECTOMY N/A 03/03/2013   Procedure: TOTAL THYROIDECTOMY;  Surgeon: Jamesetta So, MD;  Location: [facility];  Service: General;  Laterality: N/A;   TOTAL HIP ARTHROPLASTY Right 07/28/2021   Social History   Tobacco Use   Smoking status: Every Day    Packs/day: 0.50    Years: 10.00    Total pack years: 5.00    Types: Cigarettes    Last attempt to quit: 11/27/2016    Years since quitting: 5.4   Smokeless tobacco: Former  Scientific laboratory technician Use: Never used  Substance Use Topics   Alcohol use: Yes    Comment: occasional   Drug use: No   Social History   Socioeconomic History   Marital status: Single    Spouse name: Not on file   Number of children: Not on file   Years of education: Not on file   Highest education level: Not on file  Occupational History   Not on file  Tobacco Use   Smoking status: Every Day    Packs/day: 0.50    Years: 10.00    Total pack years: 5.00    Types: Cigarettes    Last attempt to quit: 11/27/2016    Years since quitting: 5.4   Smokeless tobacco: Former  Scientific laboratory technician Use: Never used  Substance and Sexual Activity   Alcohol use: Yes    Comment: occasional   Drug  use: No   Sexual activity: Yes    Birth control/protection: None  Other Topics Concern   Not on file  Social History Narrative   Works outside   Investment banker, operational of Radio broadcast assistant Strain: Low Risk  (01/20/2022)   Overall Financial Resource Strain (CARDIA)    Difficulty of Paying Living Expenses: Not hard at all  Food Insecurity: No Food Insecurity (03/10/2022)   Hunger Vital Sign    Worried About Running Out of Food in the Last Year: Never true    Ran Out of Food in the Last Year: Never true  Transportation Needs: No Transportation Needs (03/10/2022)   PRAPARE - Hydrologist (Medical): No    Lack of Transportation (Non-Medical): No  Physical Activity: Not on  file  Stress: Not on file  Social Connections: Not on file  Intimate Partner Violence: Not At Risk (01/20/2022)   Humiliation, Afraid, Rape, and Kick questionnaire    Fear of Current or Ex-Partner: No    Emotionally Abused: No    Physically Abused: No    Sexually Abused: No   Family Status  Relation Name Status   Mother  Alive   Father  Alive   Family History  Problem Relation Age of Onset   Hypertension Father    Hyperlipidemia Father       Review of Systems  Constitutional: Negative.   HENT: Negative.    Respiratory: Negative.    Cardiovascular: Negative.   Genitourinary: Negative.   Musculoskeletal:  Positive for joint pain.  Skin: Negative.   Neurological:  Negative for tingling and numbness.  All other systems reviewed and are negative.     Objective:     BP (!) 142/96   Pulse 88   Temp 98.6 F (37 C)   Ht _0  (1.778 m)   Wt 284 lb (128.8 kg)   SpO2 95%   BMI 40.75 kg/m  BP Readings from Last 3 Encounters:  05/13/22 (!) 142/96  03/27/22 (!) 148/100  02/27/22 95/62   Wt Readings from Last 3 Encounters:  05/13/22 284 lb (128.8 kg)  03/27/22 287 lb (130.2 kg)  09/26/21 273 lb (123.8 kg)      Physical Exam Vitals and nursing note reviewed.  Constitutional:      Appearance: Normal appearance. He is obese.  HENT:     Head: Normocephalic.     Right Ear: External ear normal.     Left Ear: External ear normal.     Nose: Nose normal.     Mouth/Throat:     Mouth: Mucous membranes are moist.     Pharynx: Oropharynx is clear.  Eyes:     Conjunctiva/sclera: Conjunctivae normal.  Cardiovascular:     Pulses: Normal pulses.     Heart sounds: Normal heart sounds.  Pulmonary:     Effort: Pulmonary effort is normal.     Breath sounds: Normal breath sounds.  Abdominal:     General: Bowel sounds are normal.  Musculoskeletal:     Right hip: Tenderness present. Decreased range of motion.       Legs:     Comments: Left hip pain and tenderness   Skin:    General: Skin is warm.  Neurological:     General: No focal deficit present.     Mental Status: He is alert and oriented to person, place, and time.  Psychiatric:        Behavior: Behavior normal.  No results found for any visits on 05/13/22.  Last CBC Lab Results  Component Value Date   WBC 10.8 03/27/2022   HGB 14.0 03/27/2022   HCT 40.3 03/27/2022   MCV 92 03/27/2022   MCH 31.9 03/27/2022   RDW 13.1 03/27/2022   PLT 266 12/16/9483   Last metabolic panel Lab Results  Component Value Date   GLUCOSE 245 (H) 04/23/2022   NA 132 (L) 04/23/2022   K 4.6 04/23/2022   CL 101 04/23/2022   CO2 21 04/23/2022   BUN 11 04/23/2022   CREATININE 0.97 04/23/2022   EGFR 99 04/23/2022   CALCIUM 10.3 (H) 04/23/2022   PROT 7.3 04/23/2022   ALBUMIN 4.1 04/23/2022   LABGLOB 3.2 04/23/2022   AGRATIO 1.3 04/23/2022   BILITOT <0.2 04/23/2022   ALKPHOS 119 04/23/2022   AST 14 04/23/2022   ALT 21 04/23/2022   ANIONGAP 9 05/12/2021   Last lipids Lab Results  Component Value Date   CHOL 194 04/23/2022   HDL 35 (L) 04/23/2022   LDLCALC 132 (H) 04/23/2022   TRIG 148 04/23/2022   CHOLHDL 5.5 (H) 04/23/2022   Last hemoglobin A1c Lab Results  Component Value Date   HGBA1C 7.3 (H) 03/27/2022   Last thyroid functions Lab Results  Component Value Date   TSH 5.460 (H) 04/23/2022   T4TOTAL 9.5 03/27/2022   Last vitamin D Lab Results  Component Value Date   VD25OH 32.2 03/27/2022   Last vitamin B12 and Folate No results found for: "VITAMINB12", "FOLATE"    The 10-year ASCVD risk score (Arnett DK, et al., 2019) is: 19.2%    Assessment & Plan:  Patient hear for surgical clearance.  Head to toe assessment completed.  Patient is cleared for surgery medically and will follow-up with cardiology in about a week.  Labs completed in October and values are used for this visit.  Patient educated to stop omega-3 and aspirin prior to surgery.  Forms filled out and faxed to  the [facility].     For left hip pain continue to rest the joints, pain medication as prescribed.  Follow-up with unresolved symptoms.  Problem List Items Addressed This Visit   None   No follow-ups on file.    Ivy Lynn, NP

## 2022-05-18 ENCOUNTER — Other Ambulatory Visit: Payer: Self-pay | Admitting: "Endocrinology

## 2022-05-18 DIAGNOSIS — E119 Type 2 diabetes mellitus without complications: Secondary | ICD-10-CM

## 2022-05-21 ENCOUNTER — Ambulatory Visit: Payer: Self-pay | Admitting: *Deleted

## 2022-05-21 ENCOUNTER — Ambulatory Visit: Payer: BC Managed Care – PPO | Admitting: "Endocrinology

## 2022-05-21 NOTE — Patient Instructions (Addendum)
Visit Information  Thank you for taking time to visit with me today. Please don't hesitate to contact me if I can be of assistance to you.   Following are the goals we discussed today:   Goals Addressed               This Visit's Progress     Patient Stated     Disease Progression Prevented or Minimized/Maintaining pre diabetes stage with diet information, support + resources [facility]) (pt-stated)   On track      Pt reports last HgA1c per Endocrinology was 6.3 (Dr Dorris Fetch)  Care Coordination Interventions: Discussed plans with patient for ongoing care management follow up and provided patient with direct contact information for care management team  Assessed for changes/worsening symptoms- none        Hip pain/surgery ([facility]) (pt-stated)   Not on track     Care Coordination Interventions: Reviewed provider established plan for pain management Sent education information in after summary visit for preparing and recovery from hip surgery (videos) + wrist pain      manage hypotension ([facility]) (pt-stated)   On track     Care Coordination Interventions: Discussed plans with patient for ongoing care management follow up and provided patient with direct contact information for care management team Assessed for further hypotension episodes- none Noted with some elevations recently   Encouraged him to continue to monitor his BP and to continue to stay hydrated Encouraged outreach to RN CM as needed          Our next appointment is by telephone on 06/30/22 at 2:30 pm  Please call the care guide team at [phone] if you need to cancel or reschedule your appointment.   If you are experiencing a Mental Health or [place] or need someone to talk to, please call the Suicide and Crisis Lifeline: 988 call the [place] National Suicide Prevention Lifeline: (332) 132-1287 or TTY: (845)479-8488 TTY (334)873-9482) to talk to a trained counselor call 1-800-273-TALK (toll free, 24 hour  hotline) call the [facility]: [phone] call 911   Patient verbalizes understanding of instructions and care plan provided today and agrees to view in [place]. Active MyChart status and patient understanding of how to access instructions and care plan via MyChart confirmed with patient.     The patient has been provided with contact information for the care management team and has been advised to call with any health related questions or concerns.   Naw Lasala L. Lavina Hamman, RN, BSN, [place] Coordinator Office number [phone]

## 2022-05-21 NOTE — Patient Outreach (Addendum)
  Care Coordination   Follow Up Visit Note   05/21/2022 Name: Tyler [name] MRN: [id number] DOB: [birth year]  Tyler [name] is a 46 y.o. year old male who sees Ivy Lynn, NP for primary care. I spoke with  Tyler [name] by phone today.  What matters to the patients health and wellness today?  Pending surgery for his left hip ( Dr Gladstone [email or web address]) and doing well  Cleared for surgery by Elsie Stain. NP on 05/13/22  Seen by his endocrinologist, Dr Dorris Fetch today  Denies needs at this time  Agreed to follow up after surgery     Goals Addressed               This Visit's Progress     Patient Stated     Disease Progression Prevented or Minimized/Maintaining pre diabetes stage with diet information, support + resources [facility]) (pt-stated)   On track      Pt reports last HgA1c per Endocrinology was 6.3 (Dr Dorris Fetch)  Care Coordination Interventions: Discussed plans with patient for ongoing care management follow up and provided patient with direct contact information for care management team  Assessed for changes/worsening symptoms- none        Hip pain/surgery ([facility]) (pt-stated)   Not on track     Care Coordination Interventions: Reviewed provider established plan for pain management Sent education information in after summary visit for preparing and recovery from hip surgery (videos) + wrist pain      manage hypotension ([facility]) (pt-stated)   On track     Care Coordination Interventions: Discussed plans with patient for ongoing care management follow up and provided patient with direct contact information for care management team Assessed for further hypotension episodes- none Noted with some elevations recently   Encouraged him to continue to monitor his BP and to continue to stay hydrated Encouraged outreach to RN CM as needed          SDOH assessments and interventions completed:  No     Care Coordination Interventions:  Yes, provided   Follow up plan: Follow up call  scheduled for 06/30/22    Encounter Outcome:  Pt. Visit Completed   Aminta Sakurai L. Lavina Hamman, RN, BSN, [place] Coordinator Office number [phone]

## 2022-05-27 DIAGNOSIS — M25822 Other specified joint disorders, left elbow: Secondary | ICD-10-CM | POA: Diagnosis not present

## 2022-05-27 DIAGNOSIS — R2231 Localized swelling, mass and lump, right upper limb: Secondary | ICD-10-CM | POA: Diagnosis not present

## 2022-05-27 DIAGNOSIS — M25531 Pain in right wrist: Secondary | ICD-10-CM | POA: Diagnosis not present

## 2022-05-27 DIAGNOSIS — M13831 Other specified arthritis, right wrist: Secondary | ICD-10-CM | POA: Diagnosis not present

## 2022-06-01 DIAGNOSIS — I77810 Thoracic aortic ectasia: Secondary | ICD-10-CM | POA: Diagnosis not present

## 2022-06-01 DIAGNOSIS — R931 Abnormal findings on diagnostic imaging of heart and coronary circulation: Secondary | ICD-10-CM | POA: Diagnosis not present

## 2022-06-09 ENCOUNTER — Encounter (HOSPITAL_BASED_OUTPATIENT_CLINIC_OR_DEPARTMENT_OTHER): Payer: Self-pay | Admitting: Emergency Medicine

## 2022-06-09 ENCOUNTER — Emergency Department (HOSPITAL_BASED_OUTPATIENT_CLINIC_OR_DEPARTMENT_OTHER): Payer: BC Managed Care – PPO

## 2022-06-09 ENCOUNTER — Emergency Department (HOSPITAL_BASED_OUTPATIENT_CLINIC_OR_DEPARTMENT_OTHER)
Admission: EM | Admit: 2022-06-09 | Discharge: 2022-06-09 | Disposition: A | Discharge status: Home / Self Care | Payer: BC Managed Care – PPO | Attending: Emergency Medicine | Admitting: Emergency Medicine

## 2022-06-09 ENCOUNTER — Other Ambulatory Visit: Payer: Self-pay

## 2022-06-09 DIAGNOSIS — J111 Influenza due to unidentified influenza virus with other respiratory manifestations: Secondary | ICD-10-CM

## 2022-06-09 DIAGNOSIS — Z7982 Long term (current) use of aspirin: Secondary | ICD-10-CM | POA: Insufficient documentation

## 2022-06-09 DIAGNOSIS — Z20822 Contact with and (suspected) exposure to covid-19: Secondary | ICD-10-CM | POA: Insufficient documentation

## 2022-06-09 DIAGNOSIS — R059 Cough, unspecified: Secondary | ICD-10-CM | POA: Diagnosis not present

## 2022-06-09 DIAGNOSIS — J101 Influenza due to other identified influenza virus with other respiratory manifestations: Secondary | ICD-10-CM | POA: Insufficient documentation

## 2022-06-09 DIAGNOSIS — I509 Heart failure, unspecified: Secondary | ICD-10-CM | POA: Insufficient documentation

## 2022-06-09 DIAGNOSIS — R509 Fever, unspecified: Secondary | ICD-10-CM | POA: Diagnosis not present

## 2022-06-09 DIAGNOSIS — J168 Pneumonia due to other specified infectious organisms: Secondary | ICD-10-CM | POA: Diagnosis not present

## 2022-06-09 DIAGNOSIS — R0602 Shortness of breath: Secondary | ICD-10-CM | POA: Diagnosis not present

## 2022-06-09 DIAGNOSIS — J189 Pneumonia, unspecified organism: Secondary | ICD-10-CM

## 2022-06-09 LAB — RESP PANEL BY RT-PCR (RSV, FLU A&B, COVID)  RVPGX2
Influenza A by PCR: POSITIVE — AB
Influenza B by PCR: NEGATIVE
Resp Syncytial Virus by PCR: NEGATIVE
SARS Coronavirus 2 by RT PCR: NEGATIVE

## 2022-06-09 LAB — COMPREHENSIVE METABOLIC PANEL
ALT: 67 U/L — ABNORMAL HIGH (ref 0–44)
AST: 52 U/L — ABNORMAL HIGH (ref 15–41)
Albumin: 3.3 g/dL — ABNORMAL LOW (ref 3.5–5.0)
Alkaline Phosphatase: 66 U/L (ref 38–126)
Anion gap: 9 (ref 5–15)
BUN: 9 mg/dL (ref 6–20)
CO2: 24 mmol/L (ref 22–32)
Calcium: 8.8 mg/dL — ABNORMAL LOW (ref 8.9–10.3)
Chloride: 104 mmol/L (ref 98–111)
Creatinine, Ser: 1.04 mg/dL (ref 0.61–1.24)
GFR, Estimated: >60 mL/min (ref >60)
Glucose, Bld: 180 mg/dL — ABNORMAL HIGH (ref 70–99)
Potassium: 3.6 mmol/L (ref 3.5–5.1)
Sodium: 137 mmol/L (ref 135–145)
Total Bilirubin: 0.7 mg/dL (ref 0.3–1.2)
Total Protein: 6.6 g/dL (ref 6.5–8.1)

## 2022-06-09 LAB — CBC WITH DIFFERENTIAL/PLATELET
Abs Immature Granulocytes: 0.06 10*3/uL (ref 0.00–0.07)
Basophils Absolute: 0 10*3/uL (ref 0.0–0.1)
Basophils Relative: 0 %
Eosinophils Absolute: 0 10*3/uL (ref 0.0–0.5)
Eosinophils Relative: 1 %
HCT: 38.2 % — ABNORMAL LOW (ref 39.0–52.0)
Hemoglobin: 13.1 g/dL (ref 13.0–17.0)
Immature Granulocytes: 1 %
Lymphocytes Relative: 21 %
Lymphs Abs: 1.3 10*3/uL (ref 0.7–4.0)
MCH: 30.7 pg (ref 26.0–34.0)
MCHC: 34.3 g/dL (ref 30.0–36.0)
MCV: 89.5 fL (ref 80.0–100.0)
Monocytes Absolute: 0.4 10*3/uL (ref 0.1–1.0)
Monocytes Relative: 7 %
Neutro Abs: 4.3 10*3/uL (ref 1.7–7.7)
Neutrophils Relative %: 70 %
Platelets: 154 10*3/uL (ref 150–400)
RBC: 4.27 MIL/uL (ref 4.22–5.81)
RDW: 12.5 % (ref 11.5–15.5)
WBC: 6.1 10*3/uL (ref 4.0–10.5)
nRBC: 0 % (ref 0.0–0.2)

## 2022-06-09 MED ORDER — AZITHROMYCIN 250 MG PO TABS: 250.0000 mg | ORAL_TABLET | Freq: Every day | ORAL | 0 refills | Status: DC

## 2022-06-09 MED ORDER — SODIUM CHLORIDE 0.9 % IV BOLUS
500.0000 mL | Freq: Once | INTRAVENOUS | Status: AC
  Administered 2022-06-09: 500 mL via INTRAVENOUS

## 2022-06-09 MED ORDER — AZITHROMYCIN 250 MG PO TABS
500.0000 mg | ORAL_TABLET | Freq: Once | ORAL | Status: AC
  Administered 2022-06-09: 500 mg via ORAL
  Filled 2022-06-09: qty 2

## 2022-06-09 MED ORDER — AMOXICILLIN-POT CLAVULANATE 875-125 MG PO TABS: 1.0000 | ORAL_TABLET | Freq: Two times a day (BID) | ORAL | 0 refills | Status: AC

## 2022-06-09 MED ORDER — AMOXICILLIN-POT CLAVULANATE 875-125 MG PO TABS
1.0000 | ORAL_TABLET | Freq: Once | ORAL | Status: AC
  Administered 2022-06-09: 1 via ORAL
  Filled 2022-06-09: qty 1

## 2022-06-09 MED ORDER — OSELTAMIVIR PHOSPHATE 75 MG PO CAPS: 75.0000 mg | ORAL_CAPSULE | Freq: Two times a day (BID) | ORAL | 0 refills | Status: DC

## 2022-06-09 MED ORDER — ACETAMINOPHEN 325 MG PO TABS
650.0000 mg | ORAL_TABLET | Freq: Once | ORAL | Status: AC
  Administered 2022-06-09: 650 mg via ORAL
  Filled 2022-06-09: qty 2

## 2022-06-09 NOTE — ED Triage Notes (Signed)
Patient presents to ED via POV from home. Here with fever and cough that began yesterday. Ambulatory.

## 2022-06-09 NOTE — Discharge Instructions (Addendum)
Please follow-up with your PCP. Make sure your are drinking lots of fluids and resting.  Take your antibiotics as prescribed, if your blood pressure is repeatedly low, below 90/60, or you are feeling lightheaded dizzy have severe shortness of breath or chest discomfort please return to the ER if you are having difficulty speaking full sentences, or walking 10 feet please return to the ER.

## 2022-06-09 NOTE — ED Provider Notes (Signed)
[name] Provider Note   CSN: [id number] Arrival date & time: 06/09/22  1241     History  Chief Complaint  Patient presents with   Fever    [name] is a 46 y.o. male, history of cardiomyopathy, who presents to the ED secondary to shortness of breath, fever, fatigue, for the last day.  States that he is having difficult time getting his clothes on.  He is so tired.  Family member at bedside also states that his blood pressure has been much lower than usual, and typically runs 140s for the top number but has been less than 100.  Denies any chest pain, nausea, but does endorse some diarrhea.     Home Medications Prior to Admission medications   Medication Sig Start Date End Date Taking? Authorizing Provider  amoxicillin-clavulanate (AUGMENTIN) 875-125 MG tablet Take 1 tablet by mouth every 12 (twelve) hours for 5 days. 06/09/22 06/14/22 Yes Marra Fraga L, PA  azithromycin (ZITHROMAX) 250 MG tablet Take 1 tablet (250 mg total) by mouth daily. 06/09/22  Yes Jonnelle Lawniczak L, PA  oseltamivir (TAMIFLU) 75 MG capsule Take 1 capsule (75 mg total) by mouth every 12 (twelve) hours. 06/09/22  Yes Catheryn Slifer L, PA  aspirin 81 MG chewable tablet CHEW 1 TABLET BY MOUTH TWICE A DAY AS DIRECTED FOR 30 DAYS.    [provider]  aspirin 81 MG EC tablet Take 1 tablet by mouth daily. 12/26/20   [provider]  carvedilol (COREG) 3.125 MG tablet Take by mouth. 01/08/22   [provider]  carvedilol (COREG) 3.125 MG tablet Take 3.125 mg by mouth 2 (two) times daily. 01/09/22   [provider]  celecoxib (CELEBREX) 200 MG capsule Take by mouth daily. 01/06/22   [provider]  celecoxib (CELEBREX) 200 MG capsule Take 200 mg by mouth daily. 01/06/22   [provider]  colchicine 0.6 MG tablet TAKE 2 TABLETS AT ONSET, THEN 1 TABLET 2 HOURS LATER- A MAXIMUM OF 3 TABLETS IN 24 HOURS 05/21/20   Nida, Marella Chimes, MD   Docusate Sodium (DSS) 100 MG CAPS TAKE 1 CAPSULE BY MOUTH TWICE A DAY AS DIRECTED    [provider]  ENTRESTO 97-103 MG Take 1 tablet by mouth 2 (two) times daily. 01/05/22   [provider]  HYDROcodone-acetaminophen (NORCO) 10-325 MG tablet Take 1/2-1 tablet by mouth every 6 hours as needed for pain    [provider]  HYDROcodone-acetaminophen (NORCO/VICODIN) 5-325 MG tablet Take 1-2 tablets by mouth every 12 hours as needed for severe pain 08/14/21   [provider]  ipratropium (ATROVENT) 0.06 % nasal spray USE 2 SPRAYS INTO EACH NOSTRIL THREE (3) TIMES A DAY.    [provider]  isosorbide mononitrate (IMDUR) 30 MG 24 hr tablet Take 30 mg by mouth 3 (three) times daily. 02/28/21   [provider]  isosorbide mononitrate (IMDUR) 30 MG 24 hr tablet Take 1 tablet by mouth daily.    [provider]  levothyroxine (SYNTHROID) 175 MCG tablet Take 1 tablet (175 mcg total) by mouth daily before breakfast. TAKE 1 TABLET EVERY MORNING 01/06/22   Nida, Marella Chimes, MD  levothyroxine (SYNTHROID) 200 MCG tablet TAKE 1 TABLET EVERY MORNING. HE HAS A SEPARATE RX FOR 25 MCG TO MAKE A TOTAL DAILY DOSE OF 225MCG.    [provider]  magnesium oxide (MAG-OX) 400 MG tablet Take by mouth. 01/15/22   [provider]  meloxicam (  MOBIC) 15 MG tablet TAKE 1 TABLET (15 MG TOTAL) BY MOUTH DAILY. 01/20/22   [name], FNP  metFORMIN (GLUCOPHAGE) 1000 MG tablet Take 1 tablet by mouth 2 (two) times daily with a meal.    [provider]  metFORMIN (GLUCOPHAGE) 1000 MG tablet TAKE 1 TABLET (1,000 MG TOTAL) BY MOUTH TWICE A DAY WITH FOOD 05/20/22   Cassandria Anger, MD  methocarbamol (ROBAXIN) 500 MG tablet TAKE 1 TABLET BY MOUTH EVERY 6 HOURS AS NEEDED FOR MUSCLE SPASM 09/11/21   [provider]  metoprolol succinate (TOPROL-XL) 25 MG 24 hr tablet Take 1 tablet by mouth every 12 (twelve) hours. 03/03/21   [provider]  mupirocin ointment (BACTROBAN) 2 % Apply topically.    [provider]  nystatin cream (MYCOSTATIN) APPLY 1 APPLICATION TOPICALLY TWO (2) TIMES A DAY.    [provider]  omega-3 acid ethyl esters (LOVAZA) 1 g capsule TAKE 2 CAPSULES BY MOUTH 2 TIMES DAILY. 04/20/22   Ivy Lynn, NP  polyethylene glycol (MIRALAX) 17 g packet Take 1 packet every day by oral route. 07/24/21   [provider]  polyethylene glycol (MIRALAX) 17 g packet Miralax 17 gram oral powder packet  Take 1 packet every day by oral route.    [provider]  pravastatin (PRAVACHOL) 40 MG tablet TAKE 1 TABLET BY MOUTH EVERY DAY 01/06/22   [name], FNP  pravastatin (PRAVACHOL) 40 MG tablet Take 1 tablet by mouth daily.    [provider]  sacubitril-valsartan (ENTRESTO) 24-26 MG TAKE ONE TABLET BY MOUTH 2 (TWO) TIMES DAILY. HOLD IF SYSTOLIC BLOOD PRESSURE LESS THAN 100.    [provider]  sacubitril-valsartan (ENTRESTO) 49-51 MG Take 1 tablet by mouth 2 (two) times daily.    [provider]  sacubitril-valsartan (ENTRESTO) 97-103 MG Take 1 tablet by mouth 2 (two) times daily.    [provider]  terbinafine (LAMISIL) 250 MG tablet Take 1 tablet by mouth daily.    [provider]  valACYclovir (VALTREX) 1000 MG tablet Take by mouth.    [provider]      Allergies    Isosorbide    Review of Systems   Review of Systems  Constitutional:  Positive for fever.  Respiratory:  Positive for cough and shortness of breath.   Cardiovascular:  Negative for chest pain.    Physical Exam Updated Vital Signs BP 108/81   Pulse 91   Temp 99.2 F (37.3 C)   Resp 17   Ht '5\' 10"'$  (1.778 m)   Wt 127 kg   SpO2 97%   BMI 40.18 kg/m  Physical Exam Vitals and nursing note reviewed.  Constitutional:      General: He is not in acute distress.    Appearance: He is well-developed.  HENT:     Head: Normocephalic and  atraumatic.  Eyes:     Conjunctiva/sclera: Conjunctivae normal.  Cardiovascular:     Rate and Rhythm: Normal rate and regular rhythm.     Heart sounds: No murmur heard. Pulmonary:     Effort: Pulmonary effort is normal. No respiratory distress.     Breath sounds: Examination of the left-lower field reveals rales. Rales present.  Abdominal:     Palpations: Abdomen is soft.     Tenderness: There is no abdominal tenderness.  Musculoskeletal:        General: No swelling.     Cervical back: Neck supple.  Skin:  General: Skin is warm and dry.     Capillary Refill: Capillary refill takes less than 2 seconds.  Neurological:     Mental Status: He is alert.  Psychiatric:        Mood and Affect: Mood normal.     ED Results / Procedures / Treatments   Labs (all labs ordered are listed, but only abnormal results are displayed) Labs Reviewed  RESP PANEL BY RT-PCR (RSV, FLU A&B, COVID)  RVPGX2 - Abnormal; Notable for the following components:      Result Value   Influenza A by PCR POSITIVE (*)    All other components within normal limits  COMPREHENSIVE METABOLIC PANEL - Abnormal; Notable for the following components:   Glucose, Bld 180 (*)    Calcium 8.8 (*)    Albumin 3.3 (*)    AST 52 (*)    ALT 67 (*)    All other components within normal limits  CBC WITH DIFFERENTIAL/PLATELET - Abnormal; Notable for the following components:   HCT 38.2 (*)    All other components within normal limits    EKG None  Radiology DG Chest 2 View  Result Date: 06/09/2022 CLINICAL DATA:  Provided history: Shortness of breath, cough. Additional history provided: Fever. EXAM: CHEST - 2 VIEW COMPARISON:  Prior chest radiographs 07/02/2020 and earlier. FINDINGS: Heart size within normal limits. Ill-defined opacity within the left lung base, best appreciated on the PA radiograph. No appreciable airspace consolidation on the right. No evidence of pleural effusion or pneumothorax. Degenerative changes of  the spine. IMPRESSION: Ill-defined opacity within the left lung base, suspicious for pneumonia given the provided history. Followup PA and lateral chest radiographs are recommended in 3-4 weeks following a trial of antibiotic therapy to ensure resolution and exclude underlying malignancy. Electronically Signed   By: Kellie Simmering D.O.   On: 06/09/2022 14:56    Procedures Procedures    Medications Ordered in ED Medications  acetaminophen (TYLENOL) tablet 650 mg (650 mg Oral Given 06/09/22 1517)  sodium chloride 0.9 % bolus 500 mL (0 mLs Intravenous Stopped 06/09/22 1622)  amoxicillin-clavulanate (AUGMENTIN) 875-125 MG per tablet 1 tablet (1 tablet Oral Given 06/09/22 1525)  azithromycin (ZITHROMAX) tablet 500 mg (500 mg Oral Given 06/09/22 1525)    ED Course/ Medical Decision Making/ A&P                           Medical Decision Making Patient is a 46 year old male, history of CHF, here for shortness of breath, fever fatigue for the last day.  States he is having his difficulty doing his daily activities as he is so fatigued, family members also concerned that his blood pressure has been low.  He has a low-grade temperature, right now, and is fairly well-appearing, nonlabored respirations, does have some crackles in the left lower lobe.  Will obtain chest x-ray, COVID/flu testing, for further evaluation.  Will also obtain basic labs to evaluate for electrolytes given his comorbidities.  Amount and/or Complexity of Data Reviewed Labs: ordered.    Details: Flu positive Radiology: ordered.    Details: Chest x-ray shows concern for left lower lobe pneumonia. Discussion of management or test interpretation with external provider(s): Patient is a 46 year old male, found to have left lower lobe pneumonia, and flu positive.  We will treat for bacterial pneumonia, as well as give him Tamiflu given that he is in the time range for it.  He is no longer taking the  colchicine at this time, only  symptomatic treatment that the azithromycin was prescribed.  We discussed return precautions, and his family member and him are comfortable with discharge.  He has nonlabored respirations can talk in full sentences, and is not ill-appearing.  Return precautions emphasized.  Risk OTC drugs. Prescription drug management.    Final Clinical Impression(s) / ED Diagnoses Final diagnoses:  Influenza  Pneumonia of left lower lobe due to infectious organism    Rx / DC Orders ED Discharge Orders          Ordered    amoxicillin-clavulanate (AUGMENTIN) 875-125 MG tablet  Every 12 hours        06/09/22 1559    azithromycin (ZITHROMAX) 250 MG tablet  Daily        06/09/22 1559    oseltamivir (TAMIFLU) 75 MG capsule  Every 12 hours        06/09/22 1559              Rotunda Worden Carlean Jews, PA 06/09/22 1641    Blanchie Dessert, MD 06/15/22 1856

## 2022-06-10 ENCOUNTER — Encounter (HOSPITAL_BASED_OUTPATIENT_CLINIC_OR_DEPARTMENT_OTHER): Payer: Self-pay | Admitting: Medical

## 2022-06-10 ENCOUNTER — Telehealth: Payer: BC Managed Care – PPO | Admitting: Family Medicine

## 2022-06-17 DIAGNOSIS — I2510 Atherosclerotic heart disease of native coronary artery without angina pectoris: Secondary | ICD-10-CM | POA: Diagnosis not present

## 2022-06-17 DIAGNOSIS — I10 Essential (primary) hypertension: Secondary | ICD-10-CM | POA: Diagnosis not present

## 2022-06-17 DIAGNOSIS — I428 Other cardiomyopathies: Secondary | ICD-10-CM | POA: Diagnosis not present

## 2022-06-17 DIAGNOSIS — Z01818 Encounter for other preprocedural examination: Secondary | ICD-10-CM | POA: Diagnosis not present

## 2022-06-18 ENCOUNTER — Other Ambulatory Visit: Payer: Self-pay

## 2022-06-18 ENCOUNTER — Encounter (HOSPITAL_BASED_OUTPATIENT_CLINIC_OR_DEPARTMENT_OTHER): Payer: Self-pay | Admitting: Emergency Medicine

## 2022-06-18 ENCOUNTER — Emergency Department (HOSPITAL_BASED_OUTPATIENT_CLINIC_OR_DEPARTMENT_OTHER): Payer: BC Managed Care – PPO

## 2022-06-18 DIAGNOSIS — I110 Hypertensive heart disease with heart failure: Secondary | ICD-10-CM | POA: Insufficient documentation

## 2022-06-18 DIAGNOSIS — I509 Heart failure, unspecified: Secondary | ICD-10-CM | POA: Diagnosis not present

## 2022-06-18 DIAGNOSIS — Z7982 Long term (current) use of aspirin: Secondary | ICD-10-CM | POA: Diagnosis not present

## 2022-06-18 DIAGNOSIS — Z7951 Long term (current) use of inhaled steroids: Secondary | ICD-10-CM | POA: Insufficient documentation

## 2022-06-18 DIAGNOSIS — B349 Viral infection, unspecified: Secondary | ICD-10-CM | POA: Diagnosis not present

## 2022-06-18 DIAGNOSIS — Z79899 Other long term (current) drug therapy: Secondary | ICD-10-CM | POA: Insufficient documentation

## 2022-06-18 DIAGNOSIS — R0602 Shortness of breath: Secondary | ICD-10-CM | POA: Diagnosis not present

## 2022-06-18 DIAGNOSIS — J45909 Unspecified asthma, uncomplicated: Secondary | ICD-10-CM | POA: Diagnosis not present

## 2022-06-18 NOTE — ED Triage Notes (Signed)
Patient c/o shortness of breath and dizziness x Tuesday. Patient reports he was diagnosed with flu and pneumonia last week, but feels his breathing is not improving.

## 2022-06-19 ENCOUNTER — Emergency Department (HOSPITAL_BASED_OUTPATIENT_CLINIC_OR_DEPARTMENT_OTHER)
Admission: EM | Admit: 2022-06-19 | Discharge: 2022-06-19 | Disposition: A | Discharge status: Home / Self Care | Payer: BC Managed Care – PPO | Attending: Emergency Medicine | Admitting: Emergency Medicine

## 2022-06-19 DIAGNOSIS — J45998 Other asthma: Secondary | ICD-10-CM

## 2022-06-19 MED ORDER — PREDNISONE 20 MG PO TABS
40.0000 mg | ORAL_TABLET | Freq: Once | ORAL | Status: AC
  Administered 2022-06-19: 40 mg via ORAL
  Filled 2022-06-19: qty 2

## 2022-06-19 MED ORDER — PREDNISONE 20 MG PO TABS: 40.0000 mg | ORAL_TABLET | Freq: Every day | ORAL | 0 refills | Status: AC

## 2022-06-19 MED ORDER — ALBUTEROL SULFATE HFA 108 (90 BASE) MCG/ACT IN AERS
2.0000 | INHALATION_SPRAY | RESPIRATORY_TRACT | Status: DC | PRN
  Administered 2022-06-19: 2 via RESPIRATORY_TRACT
  Filled 2022-06-19: qty 6.7

## 2022-06-19 NOTE — ED Provider Notes (Signed)
[name]  Provider Note  CSN: [id number] Arrival date & time: 06/18/22 2140  History Chief Complaint  Patient presents with   Shortness of Breath    [name] is a 46 y.o. male with history of HTN, nonischemic cardiomyopathy and CHF from several years ago has overall been doing well from a cardiac standpoint recently. He was sick about 2 weeks ago, seen in the ED and diagnosed with influenza and CAP. Given antibiotics and has been doing better. He still has some cough and occasional DOE, but no fevers and no chest pains. He saw his Cardiologist 12/27 for pre-op clearance for hip replacement but given his continued respiratory symptoms they recommended he wait. No leg swelling. Patient continues to smoke.    Home Medications Prior to Admission medications   Medication Sig Start Date End Date Taking? Authorizing Provider  predniSONE (DELTASONE) 20 MG tablet Take 2 tablets (40 mg total) by mouth daily for 4 days. 06/19/22 06/23/22 Yes Truddie Hidden, MD  aspirin 81 MG chewable tablet CHEW 1 TABLET BY MOUTH TWICE A DAY AS DIRECTED FOR 30 DAYS.    [provider]  aspirin 81 MG EC tablet Take 1 tablet by mouth daily. 12/26/20   [provider]  azithromycin (ZITHROMAX) 250 MG tablet Take 1 tablet (250 mg total) by mouth daily. 06/09/22   [name], [facility] L, PA  carvedilol (COREG) 3.125 MG tablet Take by mouth. 01/08/22   [provider]  carvedilol (COREG) 3.125 MG tablet Take 3.125 mg by mouth 2 (two) times daily. 01/09/22   [provider]  celecoxib (CELEBREX) 200 MG capsule Take by mouth daily. 01/06/22   [provider]  celecoxib (CELEBREX) 200 MG capsule Take 200 mg by mouth daily. 01/06/22   [provider]  colchicine 0.6 MG tablet TAKE 2 TABLETS AT ONSET, THEN 1 TABLET 2 HOURS LATER- A MAXIMUM OF 3 TABLETS IN 24 HOURS 05/21/20   Nida, [facility] Chimes, MD  Docusate Sodium (DSS) 100 MG CAPS TAKE 1  CAPSULE BY MOUTH TWICE A DAY AS DIRECTED    [provider]  ENTRESTO 97-103 MG Take 1 tablet by mouth 2 (two) times daily. 01/05/22   [provider]  HYDROcodone-acetaminophen (NORCO) 10-325 MG tablet Take 1/2-1 tablet by mouth every 6 hours as needed for pain    [provider]  HYDROcodone-acetaminophen (NORCO/VICODIN) 5-325 MG tablet Take 1-2 tablets by mouth every 12 hours as needed for severe pain 08/14/21   [provider]  ipratropium (ATROVENT) 0.06 % nasal spray USE 2 SPRAYS INTO EACH NOSTRIL THREE (3) TIMES A DAY.    [provider]  isosorbide mononitrate (IMDUR) 30 MG 24 hr tablet Take 30 mg by mouth 3 (three) times daily. 02/28/21   [provider]  isosorbide mononitrate (IMDUR) 30 MG 24 hr tablet Take 1 tablet by mouth daily.    [provider]  levothyroxine (SYNTHROID) 175 MCG tablet Take 1 tablet (175 mcg total) by mouth daily before breakfast. TAKE 1 TABLET EVERY MORNING 01/06/22   Nida, [facility] Chimes, MD  levothyroxine (SYNTHROID) 200 MCG tablet TAKE 1 TABLET EVERY MORNING. HE HAS A SEPARATE RX FOR 25 MCG TO MAKE A TOTAL DAILY DOSE OF 225MCG.    [provider]  magnesium oxide (MAG-OX) 400 MG tablet Take by mouth. 01/15/22   [provider]  meloxicam (MOBIC) 15 MG tablet TAKE 1 TABLET (15 MG TOTAL) BY MOUTH DAILY. 01/20/22   Blanch Media,  [facility] Quan, FNP  metFORMIN (GLUCOPHAGE) 1000 MG tablet Take 1 tablet by mouth 2 (two) times daily with a meal.    [provider]  metFORMIN (GLUCOPHAGE) 1000 MG tablet TAKE 1 TABLET (1,000 MG TOTAL) BY MOUTH TWICE A DAY WITH FOOD 05/20/22   Nida, [facility] Chimes, MD  methocarbamol (ROBAXIN) 500 MG tablet TAKE 1 TABLET BY MOUTH EVERY 6 HOURS AS NEEDED FOR MUSCLE SPASM 09/11/21   [provider]  metoprolol succinate (TOPROL-XL) 25 MG 24 hr tablet Take 1 tablet by mouth every 12 (twelve) hours. 03/03/21   [provider]  mupirocin ointment (BACTROBAN) 2  % Apply topically.    [provider]  nystatin cream (MYCOSTATIN) APPLY 1 APPLICATION TOPICALLY TWO (2) TIMES A DAY.    [provider]  omega-3 acid ethyl esters (LOVAZA) 1 g capsule TAKE 2 CAPSULES BY MOUTH 2 TIMES DAILY. 04/20/22   Ivy Lynn, NP  oseltamivir (TAMIFLU) 75 MG capsule Take 1 capsule (75 mg total) by mouth every 12 (twelve) hours. 06/09/22   [name], [facility] L, PA  polyethylene glycol (MIRALAX) 17 g packet Take 1 packet every day by oral route. 07/24/21   [provider]  polyethylene glycol (MIRALAX) 17 g packet Miralax 17 gram oral powder packet  Take 1 packet every day by oral route.    [provider]  pravastatin (PRAVACHOL) 40 MG tablet TAKE 1 TABLET BY MOUTH EVERY DAY 01/06/22   [name] Brooklyn, FNP  pravastatin (PRAVACHOL) 40 MG tablet Take 1 tablet by mouth daily.    [provider]  sacubitril-valsartan (ENTRESTO) 24-26 MG TAKE ONE TABLET BY MOUTH 2 (TWO) TIMES DAILY. HOLD IF SYSTOLIC BLOOD PRESSURE LESS THAN 100.    [provider]  sacubitril-valsartan (ENTRESTO) 49-51 MG Take 1 tablet by mouth 2 (two) times daily.    [provider]  sacubitril-valsartan (ENTRESTO) 97-103 MG Take 1 tablet by mouth 2 (two) times daily.    [provider]  terbinafine (LAMISIL) 250 MG tablet Take 1 tablet by mouth daily.    [provider]  valACYclovir (VALTREX) 1000 MG tablet Take by mouth.    [provider]     Allergies    Isosorbide   Review of Systems   Review of Systems Please see HPI for pertinent positives and negatives  Physical Exam BP 119/85   Pulse 82   Temp 98.1 F (36.7 C)   Resp 18   Ht '5\' 10"'$  (1.778 m)   Wt 124.7 kg   SpO2 96%   BMI 39.46 kg/m   Physical Exam Vitals and nursing note reviewed.  Constitutional:      Appearance: Normal appearance.  HENT:     Head: Normocephalic and atraumatic.     Nose: Nose normal.     Mouth/Throat:     Mouth: Mucous  membranes are moist.  Eyes:     Extraocular Movements: Extraocular movements intact.     Conjunctiva/sclera: Conjunctivae normal.  Cardiovascular:     Rate and Rhythm: Normal rate.  Pulmonary:     Effort: Pulmonary effort is normal.     Breath sounds: Decreased breath sounds present. No wheezing.  Abdominal:     General: Abdomen is flat.     Palpations: Abdomen is soft.     Tenderness: There is no abdominal tenderness.  Musculoskeletal:        General: No swelling. Normal range of motion.     Cervical back: Neck supple.  Right lower leg: No edema.     Left lower leg: No edema.  Skin:    General: Skin is warm and dry.  Neurological:     General: No focal deficit present.     Mental Status: He is alert.  Psychiatric:        Mood and Affect: Mood normal.     ED Results / Procedures / Treatments   EKG None  Procedures Procedures  Medications Ordered in the ED Medications  albuterol (VENTOLIN HFA) 108 (90 Base) MCG/ACT inhaler 2 puff (has no administration in time range)  predniSONE (DELTASONE) tablet 40 mg (40 mg Oral Given 06/19/22 0348)    Initial Impression and Plan  Patient with prior cardiac history, here for persistent cough and SOB after recent influenza. He has reassuring vitals, exam with some decreased air movement but no frank wheezing or rales. No leg edema to suggest acute CHF. I personally viewed the images from radiology studies and agree with radiologist interpretation: CXR is clear, no signs of worsening infiltrate or edema. Patient had labs done last which which were also reassuring. Discussed with patient symptoms likely a post-viral bronchitis. Will give albuterol HFA and a short course of steroids. Patient is amenable to that plan. Recommend he follow up with PCP or [facility] if symptoms worsen.   ED Course       MDM Rules/Calculators/A&P Medical Decision Making Problems Addressed: Post viral RAD (reactive airway disease): acute illness or  injury  Amount and/or Complexity of Data Reviewed Radiology: ordered and independent interpretation performed. Decision-making details documented in ED Course.  Risk Prescription drug management.    Final Clinical Impression(s) / ED Diagnoses Final diagnoses:  Post viral RAD (reactive airway disease)    Rx / DC Orders ED Discharge Orders          Ordered    predniSONE (DELTASONE) 20 MG tablet  Daily        06/19/22 0349             Truddie Hidden, MD 06/19/22 [phone]

## 2022-06-21 ENCOUNTER — Other Ambulatory Visit: Payer: Self-pay | Admitting: Family Medicine

## 2022-06-30 ENCOUNTER — Encounter: Payer: Self-pay | Admitting: "Endocrinology

## 2022-06-30 ENCOUNTER — Ambulatory Visit: Payer: BC Managed Care – PPO | Admitting: "Endocrinology

## 2022-06-30 ENCOUNTER — Encounter: Payer: Self-pay | Admitting: *Deleted

## 2022-06-30 VITALS — BP 140/112 | HR 68 | Ht 70.0 in | Wt 291.2 lb

## 2022-06-30 DIAGNOSIS — E890 Postprocedural hypothyroidism: Secondary | ICD-10-CM | POA: Diagnosis not present

## 2022-06-30 DIAGNOSIS — E119 Type 2 diabetes mellitus without complications: Secondary | ICD-10-CM | POA: Diagnosis not present

## 2022-06-30 DIAGNOSIS — C73 Malignant neoplasm of thyroid gland: Secondary | ICD-10-CM | POA: Diagnosis not present

## 2022-06-30 DIAGNOSIS — I10 Essential (primary) hypertension: Secondary | ICD-10-CM | POA: Diagnosis not present

## 2022-06-30 LAB — POCT GLYCOSYLATED HEMOGLOBIN (HGB A1C): HbA1c, POC (controlled diabetic range): 8.4 % — AB (ref 0.0–7.0)

## 2022-06-30 MED ORDER — METOPROLOL SUCCINATE ER 100 MG PO TB24: 100.0000 mg | ORAL_TABLET | Freq: Every day | ORAL | 1 refills | Status: DC

## 2022-06-30 NOTE — Patient Instructions (Signed)

## 2022-06-30 NOTE — Progress Notes (Signed)
06/30/2022     Endocrinology follow-up note   Subjective:    Patient ID: Tyler [name], male    DOB: September 01, 1975, PCP Ivy Lynn, NP   Past Medical History:  Diagnosis Date   Cardiomyopathy ([place])    Diabetes ([place])    Gout    High cholesterol    Hypertension    Low testosterone 04/17/2021   Malignant neoplasm of thyroid gland ([place]) 04/17/2016   Postsurgical hypothyroidism    thyroid cancer   Vitamin D insufficiency    Past Surgical History:  Procedure Laterality Date   EYE SURGERY Left    cataract removal   FRACTURE SURGERY Left 06/23/1987   ankle   THYROIDECTOMY N/A 03/03/2013   Procedure: TOTAL THYROIDECTOMY;  Surgeon: Jamesetta So, MD;  Location: [facility];  Service: General;  Laterality: N/A;   TOTAL HIP ARTHROPLASTY Right 07/28/2021   Social History   Socioeconomic History   Marital status: Single    Spouse name: Not on file   Number of children: Not on file   Years of education: Not on file   Highest education level: Not on file  Occupational History   Not on file  Tobacco Use   Smoking status: Every Day    Packs/day: 0.50    Years: 10.00    Total pack years: 5.00    Types: Cigarettes    Last attempt to quit: 11/27/2016    Years since quitting: 5.5   Smokeless tobacco: Former  Scientific laboratory technician Use: Never used  Substance and Sexual Activity   Alcohol use: Yes    Comment: occasional   Drug use: No   Sexual activity: Yes    Birth control/protection: None  Other Topics Concern   Not on file  Social History Narrative   Works outside   Investment banker, operational of Radio broadcast assistant Strain: [place]  (01/20/2022)   Overall Financial Resource Strain (CARDIA)    Difficulty of Paying Living Expenses: Not hard at all  Food Insecurity: No Food Insecurity (03/10/2022)   Hunger Vital Sign    Worried About Running Out of Food in the Last Year: Never true    [place] in the Last Year: Never true  Transportation Needs: No Transportation  Needs (03/10/2022)   PRAPARE - Hydrologist (Medical): No    Lack of Transportation (Non-Medical): No  Physical Activity: Not on file  Stress: Not on file  Social Connections: Not on file   Outpatient Encounter Medications as of 06/30/2022  Medication Sig   aspirin 81 MG chewable tablet CHEW 1 TABLET BY MOUTH TWICE A DAY AS DIRECTED FOR 30 DAYS.   aspirin 81 MG EC tablet Take 1 tablet by mouth daily.   azithromycin (ZITHROMAX) 250 MG tablet Take 1 tablet (250 mg total) by mouth daily.   celecoxib (CELEBREX) 200 MG capsule Take by mouth daily.   celecoxib (CELEBREX) 200 MG capsule Take 200 mg by mouth daily.   colchicine 0.6 MG tablet TAKE 2 TABLETS AT ONSET, THEN 1 TABLET 2 HOURS LATER- A MAXIMUM OF 3 TABLETS IN 24 HOURS   Docusate Sodium (DSS) 100 MG CAPS TAKE 1 CAPSULE BY MOUTH TWICE A DAY AS DIRECTED   ENTRESTO 97-103 MG Take 1 tablet by mouth 2 (two) times daily.   HYDROcodone-acetaminophen (NORCO) 10-325 MG tablet Take 1/2-1 tablet by mouth every 6 hours as needed for pain   HYDROcodone-acetaminophen (NORCO/VICODIN) 5-325 MG tablet Take  1-2 tablets by mouth every 12 hours as needed for severe pain   ipratropium (ATROVENT) 0.06 % nasal spray USE 2 SPRAYS INTO EACH NOSTRIL THREE (3) TIMES A DAY.   isosorbide mononitrate (IMDUR) 30 MG 24 hr tablet Take 30 mg by mouth 3 (three) times daily.   isosorbide mononitrate (IMDUR) 30 MG 24 hr tablet Take 1 tablet by mouth daily.   levothyroxine (SYNTHROID) 200 MCG tablet TAKE 1 TABLET EVERY MORNING. HE HAS A SEPARATE RX FOR 25 MCG TO MAKE A TOTAL DAILY DOSE OF 225MCG.   magnesium oxide (MAG-OX) 400 MG tablet Take by mouth.   meloxicam (MOBIC) 15 MG tablet TAKE 1 TABLET (15 MG TOTAL) BY MOUTH DAILY.   metFORMIN (GLUCOPHAGE) 1000 MG tablet Take 1 tablet by mouth 2 (two) times daily with a meal.   methocarbamol (ROBAXIN) 500 MG tablet TAKE 1 TABLET BY MOUTH EVERY 6 HOURS AS NEEDED FOR MUSCLE SPASM   metoprolol succinate  (TOPROL-XL) 100 MG 24 hr tablet Take 1 tablet (100 mg total) by mouth daily after breakfast.   mupirocin ointment (BACTROBAN) 2 % Apply topically.   nystatin cream (MYCOSTATIN) APPLY 1 APPLICATION TOPICALLY TWO (2) TIMES A DAY.   omega-3 acid ethyl esters (LOVAZA) 1 g capsule TAKE 2 CAPSULES BY MOUTH 2 TIMES DAILY.   oseltamivir (TAMIFLU) 75 MG capsule Take 1 capsule (75 mg total) by mouth every 12 (twelve) hours.   polyethylene glycol (MIRALAX) 17 g packet Take 1 packet every day by oral route.   polyethylene glycol (MIRALAX) 17 g packet Miralax 17 gram oral powder packet  Take 1 packet every day by oral route.   pravastatin (PRAVACHOL) 40 MG tablet TAKE 1 TABLET BY MOUTH EVERY DAY   sacubitril-valsartan (ENTRESTO) 97-103 MG Take 1 tablet by mouth 2 (two) times daily.   terbinafine (LAMISIL) 250 MG tablet Take 1 tablet by mouth daily.   valACYclovir (VALTREX) 1000 MG tablet Take by mouth.   [DISCONTINUED] carvedilol (COREG) 3.125 MG tablet Take by mouth.   [DISCONTINUED] carvedilol (COREG) 3.125 MG tablet Take 3.125 mg by mouth 2 (two) times daily.   [DISCONTINUED] levothyroxine (SYNTHROID) 175 MCG tablet Take 1 tablet (175 mcg total) by mouth daily before breakfast. TAKE 1 TABLET EVERY MORNING (Patient not taking: Reported on 06/30/2022)   [DISCONTINUED] metFORMIN (GLUCOPHAGE) 1000 MG tablet TAKE 1 TABLET (1,000 MG TOTAL) BY MOUTH TWICE A DAY WITH FOOD   [DISCONTINUED] metoprolol succinate (TOPROL-XL) 25 MG 24 hr tablet Take 1 tablet by mouth every 12 (twelve) hours.   [DISCONTINUED] sacubitril-valsartan (ENTRESTO) 24-26 MG TAKE ONE TABLET BY MOUTH 2 (TWO) TIMES DAILY. HOLD IF SYSTOLIC BLOOD PRESSURE LESS THAN 100.   [DISCONTINUED] sacubitril-valsartan (ENTRESTO) 49-51 MG Take 1 tablet by mouth 2 (two) times daily.   No facility-administered encounter medications on file as of 06/30/2022.   ALLERGIES: Allergies  Allergen Reactions   Isosorbide Other (See Comments)    Low blood pressure    VACCINATION STATUS: Immunization History  Administered Date(s) Administered   Influenza-Unspecified 05/20/2000   Janssen (J&J) SARS-COV-2 Vaccination 04/03/2020   Td 04/08/1999   Tdap 05/05/2020    HPI  47 yr old male with medical history as above . He underwent total thyroidectomy for thyroid cancer on 03/03/2013 by Dr. Arnoldo Morale at [facility] in [place].   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 no lymph nodes were identified.  He received his first Thyrogen stimulated I -131 thyroid  remnant ablation on 05/05/13 same, with no evidence of distant metastasis. -Subsequent Thyrogen Stimulated  whole body scan in January 2017  was reported to be unremarkable for tumor recurrence/distant metastases.    -Recently, he underwent Thyrogen stimulated whole-body scan which was unremarkable. He presents with repeat thyroid function test consistent with slight over-replacement.  He is currently on levothyroxine 200 mcg p.o. daily before breakfast.     - He also has hypertension, type 2 diabetes, currently on metformin.  His point-of-care A1c is 8%, increasing from 6.3%.  He remains on metformin 1000 mg p.o. twice daily.  His blood pressure is not controlled despite multiple blood pressure medications with duplicate doses.  Review of Systems  Review of systems  Constitutional: + Minimally fluctuating body weight,  current  Body mass index is 41.78 kg/m. , no fatigue, no subjective hyperthermia, no subjective hypothermia    Objective:    BP (!) 140/112 Comment: 140/110 R arm Dr.Savilla Turbyfill made aware  Pulse 68   Ht '5\' 10"'$  (1.778 m)   Wt 291 lb 3.2 oz (132.1 kg)   BMI 41.78 kg/m   Wt Readings from Last 3 Encounters:  06/30/22 291 lb 3.2 oz (132.1 kg)  06/18/22 275 lb (124.7 kg)  06/09/22 280 lb (127 kg)      Recent Results (from the past 2160 hour(s))  Comprehensive metabolic panel      Status: Abnormal   Collection Time: 04/23/22  8:21 AM  Result Value Ref Range   Glucose 245 (H) 70 - 99 mg/dL   BUN 11 6 - 24 mg/dL   Creatinine, Ser 0.97 0.76 - 1.27 mg/dL   eGFR 99 >59 mL/min/1.73   BUN/Creatinine Ratio 11 9 - 20   Sodium 132 (L) 134 - 144 mmol/L   Potassium 4.6 3.5 - 5.2 mmol/L   Chloride 101 96 - 106 mmol/L   CO2 21 20 - 29 mmol/L   Calcium 10.3 (H) 8.7 - 10.2 mg/dL   Total Protein 7.3 6.0 - 8.5 g/dL   Albumin 4.1 4.1 - 5.1 g/dL   Globulin, Total 3.2 1.5 - 4.5 g/dL   Albumin/Globulin Ratio 1.3 1.2 - 2.2   Bilirubin Total <0.2 0.0 - 1.2 mg/dL   Alkaline Phosphatase 119 44 - 121 IU/L   AST 14 0 - 40 IU/L   ALT 21 0 - 44 IU/L  TSH     Status: Abnormal   Collection Time: 04/23/22  8:21 AM  Result Value Ref Range   TSH 5.460 (H) 0.450 - 4.500 uIU/mL  T4, free     Status: None   Collection Time: 04/23/22  8:21 AM  Result Value Ref Range   Free T4 1.34 0.82 - 1.77 ng/dL  Thyroglobulin Level     Status: None   Collection Time: 04/23/22  8:21 AM  Result Value Ref Range   Thyroglobulin (TG-RIA) <2.0 ng/mL    Comment: This test was developed and its performance characteristics determined by [facility]. It has not been cleared or approved by the Food and Drug Administration. Reference Range: Pubertal Children and Adults: <40 According to the [facility] of Clinical Biochemistry, the reference interval for Thyroglobulin (TG) should be related to euthyroid patients and not for patients who underwent thyroidectomy.  TG reference intervals for these patients depend on the residual mass of the thyroid tissue left after surgery.  Establishing a post-operative baseline is recommended.  The assay quantitation limit is 2.0 ng/mL.   Lipid panel     Status: Abnormal  Collection Time: 04/23/22  8:21 AM  Result Value Ref Range   Cholesterol, Total 194 100 - 199 mg/dL   Triglycerides 148 0 - 149 mg/dL   HDL 35 (L) >39 mg/dL   VLDL Cholesterol Cal 27 5 - 40 mg/dL    LDL Chol Calc (NIH) 132 (H) 0 - 99 mg/dL   Chol/HDL Ratio 5.5 (H) 0.0 - 5.0 ratio    Comment:                                   T. Chol/HDL Ratio                                             Men  Women                               1/2 Avg.Risk  3.4    3.3                                   Avg.Risk  5.0    4.4                                2X Avg.Risk  9.6    7.1                                3X Avg.Risk 23.4   11.0   Testosterone, Free, Total, SHBG     Status: Abnormal   Collection Time: 04/23/22  8:21 AM  Result Value Ref Range   Testosterone 144 (L) 264 - 916 ng/dL    Comment: Adult male reference interval is based on a population of healthy nonobese males (BMI <30) between 13 and 30 years old. [place], [place] [phone]. PMID: [id number].    Testosterone, Free 2.5 (L) 6.8 - 21.5 pg/mL   Sex Hormone Binding 19.9 16.5 - 55.9 nmol/L  PSA     Status: None   Collection Time: 04/23/22  8:21 AM  Result Value Ref Range   Prostate Specific Ag, Serum 0.4 0.0 - 4.0 ng/mL    Comment: Roche ECLIA methodology. According to the American Urological Association, Serum PSA should decrease and remain at undetectable levels after radical prostatectomy. The AUA defines biochemical recurrence as an initial PSA value 0.2 ng/mL or greater followed by a subsequent confirmatory PSA value 0.2 ng/mL or greater. Values obtained with different assay methods or kits cannot be used interchangeably. Results cannot be interpreted as absolute evidence of the presence or absence of malignant disease.   Prolactin     Status: None   Collection Time: 04/23/22  8:21 AM  Result Value Ref Range   Prolactin 11.6 4.0 - 15.2 ng/mL  Ferritin     Status: None   Collection Time: 04/23/22  8:21 AM  Result Value Ref Range   Ferritin 200 30 - 400 ng/mL  Luteinizing hormone     Status: None   Collection Time: 04/23/22  8:21 AM  Result Value Ref Range   LH 6.7 1.7 - 8.6 mIU/mL  Follicle stimulating hormone  Status: None   Collection Time: 04/23/22  8:21 AM  Result Value Ref Range   FSH 3.4 1.5 - 12.4 mIU/mL  Resp panel by RT-PCR (RSV, Flu A&B, Covid) Anterior Nasal Swab     Status: Abnormal   Collection Time: 06/09/22 12:52 PM   Specimen: Anterior Nasal Swab  Result Value Ref Range   SARS Coronavirus 2 by RT PCR NEGATIVE NEGATIVE    Comment: (NOTE) SARS-CoV-2 target nucleic acids are NOT DETECTED.  The SARS-CoV-2 RNA is generally detectable in upper respiratory specimens during the acute phase of infection. The lowest concentration of SARS-CoV-2 viral copies this assay can detect is 138 copies/mL. A negative result does not preclude SARS-Cov-2 infection and should not be used as the sole basis for treatment or other patient management decisions. A negative result may occur with  improper specimen collection/handling, submission of specimen other than nasopharyngeal swab, presence of viral mutation(s) within the areas targeted by this assay, and inadequate number of viral copies(<138 copies/mL). A negative result must be combined with clinical observations, patient history, and epidemiological information. The expected result is Negative.  Fact Sheet for Patients:  EntrepreneurPulse.com.au  Fact Sheet for Healthcare Providers:  IncredibleEmployment.be  This test is no t yet approved or cleared by the [place] FDA and  has been authorized for detection and/or diagnosis of SARS-CoV-2 by FDA under an Emergency Use Authorization (EUA). This EUA will remain  in effect (meaning this test can be used) for the duration of the COVID-19 declaration under Section 564(b)(1) of the Act, 21 U.S.C.section 360bbb-3(b)(1), unless the authorization is terminated  or revoked sooner.       Influenza A by PCR POSITIVE (A) NEGATIVE   Influenza B by PCR NEGATIVE NEGATIVE    Comment: (NOTE) The Xpert Xpress SARS-CoV-2/FLU/RSV plus assay is intended as an  aid in the diagnosis of influenza from Nasopharyngeal swab specimens and should not be used as a sole basis for treatment. Nasal washings and aspirates are unacceptable for Xpert Xpress SARS-CoV-2/FLU/RSV testing.  Fact Sheet for Patients: EntrepreneurPulse.com.au  Fact Sheet for Healthcare Providers: IncredibleEmployment.be  This test is not yet approved or cleared by the [place] FDA and has been authorized for detection and/or diagnosis of SARS-CoV-2 by FDA under an Emergency Use Authorization (EUA). This EUA will remain in effect (meaning this test can be used) for the duration of the COVID-19 declaration under Section 564(b)(1) of the Act, 21 U.S.C. section 360bbb-3(b)(1), unless the authorization is terminated or revoked.     Resp Syncytial Virus by PCR NEGATIVE NEGATIVE    Comment: (NOTE) Fact Sheet for Patients: EntrepreneurPulse.com.au  Fact Sheet for Healthcare Providers: IncredibleEmployment.be  This test is not yet approved or cleared by the [place] FDA and has been authorized for detection and/or diagnosis of SARS-CoV-2 by FDA under an Emergency Use Authorization (EUA). This EUA will remain in effect (meaning this test can be used) for the duration of the COVID-19 declaration under Section 564(b)(1) of the Act, 21 U.S.C. section 360bbb-3(b)(1), unless the authorization is terminated or revoked.  Performed at [facility], [address] [place], [place] [address]   Comprehensive metabolic panel     Status: Abnormal   Collection Time: 06/09/22  3:16 PM  Result Value Ref Range   Sodium 137 135 - 145 mmol/L   Potassium 3.6 3.5 - 5.1 mmol/L   Chloride 104 98 - 111 mmol/L   CO2 24 22 - 32 mmol/L   Glucose, Bld 180 (H)  70 - 99 mg/dL    Comment: Glucose reference range applies only to samples taken after fasting for at least 8 hours.   BUN 9 6 - 20 mg/dL   Creatinine,  Ser 1.04 0.61 - 1.24 mg/dL   Calcium 8.8 (L) 8.9 - 10.3 mg/dL   Total Protein 6.6 6.5 - 8.1 g/dL   Albumin 3.3 (L) 3.5 - 5.0 g/dL   AST 52 (H) 15 - 41 U/L   ALT 67 (H) 0 - 44 U/L   Alkaline Phosphatase 66 38 - 126 U/L   Total Bilirubin 0.7 0.3 - 1.2 mg/dL   GFR, Estimated >60 >60 mL/min    Comment: (NOTE) Calculated using the CKD-EPI Creatinine Equation (2021)    Anion gap 9 5 - 15    Comment: Performed at [facility], [address] [place], [place] [address]  CBC with Differential     Status: Abnormal   Collection Time: 06/09/22  3:16 PM  Result Value Ref Range   WBC 6.1 4.0 - 10.5 K/uL   RBC 4.27 4.22 - 5.81 MIL/uL   Hemoglobin 13.1 13.0 - 17.0 g/dL   HCT 38.2 (L) 39.0 - 52.0 %   MCV 89.5 80.0 - 100.0 fL   MCH 30.7 26.0 - 34.0 pg   MCHC 34.3 30.0 - 36.0 g/dL   RDW 12.5 11.5 - 15.5 %   Platelets 154 150 - 400 K/uL   nRBC 0.0 0.0 - 0.2 %   Neutrophils Relative % 70 %   Neutro Abs 4.3 1.7 - 7.7 K/uL   Lymphocytes Relative 21 %   Lymphs Abs 1.3 0.7 - 4.0 K/uL   Monocytes Relative 7 %   Monocytes Absolute 0.4 0.1 - 1.0 K/uL   Eosinophils Relative 1 %   Eosinophils Absolute 0.0 0.0 - 0.5 K/uL   Basophils Relative 0 %   Basophils Absolute 0.0 0.0 - 0.1 K/uL   Immature Granulocytes 1 %   Abs Immature Granulocytes 0.06 0.00 - 0.07 K/uL    Comment: Performed at [facility], [address] [place], [place] [address]  HgB A1c     Status: Abnormal   Collection Time: 06/30/22 12:13 PM  Result Value Ref Range   Hemoglobin A1C     HbA1c POC (<> result, manual entry)     HbA1c, POC (prediabetic range)     HbA1c, POC (controlled diabetic range) 8.4 (A) 0.0 - 7.0 %      Diabetic Labs (most recent): Lab Results  Component Value Date   HGBA1C 8.4 (A) 06/30/2022   HGBA1C 7.3 (H) 03/27/2022   HGBA1C 6.4 (H) 01/23/2022   MICROALBUR 29.5 02/12/2020    Lipid Panel     Component Value Date/Time   CHOL 194 04/23/2022 0821   TRIG 148 04/23/2022 0821    HDL 35 (L) 04/23/2022 0821   CHOLHDL 5.5 (H) 04/23/2022 0821   LDLCALC 132 (H) 04/23/2022 0821   LABVLDL 27 04/23/2022 0821    Assessment & Plan:   1. Postsurgical hypothyroidism  His recent thyroid function tests were consistent with slight under replacement.  He is advised to continue levothyroxine 200 mcg p.o. daily before breakfast.   - We discussed about the correct intake of his thyroid hormone, on empty stomach at fasting, with water, separated by at least 30 minutes from breakfast and other medications,  and separated by more than 4 hours from calcium, iron, multivitamins, acid reflux medications (PPIs). -Patient is made aware of  the fact that thyroid hormone replacement is needed for life, dose to be adjusted by periodic monitoring of thyroid function tests.    - TSH target for thyroid cancer patients is between 0.1-0.5.  2. Malignant neoplasm of thyroid gland ([place])  -  03/03/2013:  Status post  near total thyroidectomy for stage 1 multifocal FVPTC. pT1NxMx . 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 - After his surgery, he received  170mi of I-131 remnant ablation on 05/05/13,  post therapy whole body scar showing no evidence of distant metastasis.  - surveillance ultrasound of the neck/thyroid in February 2016 was negative for thyroid remnants.  - whole-body scan with Thyrogen was negative for tumor recurrence on 07/05/2015.  -Another surveillance  thyroid/neck ultrasound on 06/09/2016 is remarkable for surgically absent thyroid.  - on October 04, 2017 he underwent another surveillance Thyrogen stimulated  whole-body scan with negative findings for tumor recurrence or distant metastasis. -His previous labs show thyroglobulin level undetectable (< 2) -Previsit surveillance thyroid/neck ultrasound negative for  residual thyroid  tissue or recurrence or lymphadenopathy.  -September 2022 Thyrogen stimulated whole-body scan is unremarkable and favorable for tumor remission.  He will be considered for thyroid/neck ultrasound next year.  2.  Type 2 diabetes: Recent diagnosis, worsening. -His revisit labs show higher A1c of 8%, advised to continue metformin 1000 mg p.o. twice daily.  I had a long discussion with this patient on potential complications of uncontrolled diabetes.  His diabetes so far is complicated by obesity/sedentary life and patient remains at exceedingly high risk for complications including    CAD, CVA, CKD, retinopathy, and neuropathy. These are all discussed in detail with him.  - I have counseled him on diet management and weight loss, by adopting a carbohydrate restricted/protein rich diet.  - he acknowledges that there is a room for improvement in his food and drink choices. - Suggestion is made for him to avoid simple carbohydrates  from his diet including Cakes, Sweet Desserts, Ice Cream, Soda (diet and regular), Sweet Tea, Candies, Chips, Cookies, Store Bought Juices, Alcohol in Excess of  1-2 drinks a day, Artificial Sweeteners,  Coffee Creamer, and "Sugar-free" Products, Lemonade. This will help patient to have more stable blood glucose profile and potentially avoid unintended weight gain.  - I encouraged him to switch to  unprocessed or minimally processed complex starch and increased protein intake (animal or plant source), fruits, and vegetables.  -In light of his history of thyroid malignancy, he is not an optimal candidate for GLP-1 receptor agonists nor DPP 4 inhibitors.   If he returns with higher A1c of above 8%, he will be considered for low-dose glipizide.   3) hypertension-his blood pressure is controlled despite multiple medications.  He has a questionable compliance with blood pressure medications.  I have revised his medications as follows: Entresto 97-103 mg p.o. daily, consolidate  his beta-blocker to Toprol 100 mg XL p.o. daily at breakfast, Imdur 30 mg p.o. daily. He is advised to discontinue carvedilol, and the other doses of Entresto.  4) hyperlipidemia-uncontrolled lipid panel with LDL of 133.  This adds to his risk of cardiovascular disease.  He is on pravastatin 40 mg p.o. nightly.  He is advised to continue.  Food plant-based diet discussed above will help with dyslipidemia as well. - Patient specific target  A1c;  LDL, HDL, Triglycerides, and  Waist Circumference were discussed in  detail.   - I advised patient to maintain close follow up with Ivy Lynn, NP for primary care needs.   I spent 28 minutes in the care of the patient today including review of labs from Thyroid Function, CMP, and other relevant labs ; imaging/biopsy records (current and previous including abstractions from other facilities); face-to-face time discussing  his lab results and symptoms, medications doses, his options of short and long term treatment based on the latest standards of care / guidelines;   and documenting the encounter. Risk reduction counseling performed per USPSTF guidelines to reduce obesity and cardiovascular risk factors.   Leighton Ruff Dadamo  participated in the discussions, expressed understanding, and voiced agreement with the above plans.  All questions were answered to his satisfaction. he is encouraged to contact clinic should he have any questions or concerns prior to his return visit.   Follow up plan: Return in about 3 months (around 09/29/2022) for F/U with Pre-visit Labs, Meter/CGM/Logs, A1c here.  Glade Lloyd, MD Phone: [phone]  Fax: [phone]  This note was partially dictated with voice recognition software. Similar sounding words can be transcribed inadequately or may not  be corrected upon review.  06/30/2022, 4:14 PM

## 2022-07-01 ENCOUNTER — Ambulatory Visit: Payer: BC Managed Care – PPO | Admitting: "Endocrinology

## 2022-07-03 ENCOUNTER — Encounter: Payer: Self-pay | Admitting: *Deleted

## 2022-07-08 ENCOUNTER — Other Ambulatory Visit: Payer: Self-pay | Admitting: Nurse Practitioner

## 2022-07-15 ENCOUNTER — Telehealth: Payer: Self-pay

## 2022-07-15 NOTE — Patient Outreach (Signed)
  Care Coordination   07/15/2022 Name: Tyler [name] MRN: [id number] DOB: 11-05-1975   Care Coordination Outreach Attempts:  An unsuccessful telephone outreach was attempted for a scheduled appointment today.  Follow Up Plan:  Additional outreach attempts will be made to offer the patient care coordination information and services.   Encounter Outcome:  No Answer   Care Coordination Interventions:  No, not indicated    Quinn Plowman [facility] [place] [phone] direct line

## 2022-08-13 ENCOUNTER — Encounter: Payer: Self-pay | Admitting: Family Medicine

## 2022-08-13 ENCOUNTER — Ambulatory Visit: Payer: BC Managed Care – PPO | Admitting: Family Medicine

## 2022-08-13 VITALS — BP 125/87 | HR 90 | Temp 98.1°F | Ht 70.0 in | Wt 281.6 lb

## 2022-08-13 DIAGNOSIS — Z01818 Encounter for other preprocedural examination: Secondary | ICD-10-CM

## 2022-08-13 NOTE — Progress Notes (Signed)
Subjective:  Patient ID: Tyler [name], male    DOB: [birth year], 47 y.o.   MRN: [id number]  Patient Care Team: Baruch Gouty, FNP as PCP - General (Family Medicine) Harlen Labs, MD as Referring Physician (Optometry) Latanya Maudlin, MD as Consulting Physician (Orthopedic Surgery) Cassandria Anger, MD as Consulting Physician (Endocrinology) Marina Goodell, MD as Referring Physician (Cardiology) Barbaraann Faster, RN as [place] Management   Chief Complaint:  Pre-op Exam   HPI: Tyler [name] is a 47 y.o. male presenting on 08/13/2022 for Pre-op Exam   Pt presents today for preoperative exam. He is scheduled for total left hip by Dr. Alvan Dame. He had right hip replaced last year and tolerated very well. He has been evaluated and cleared for surgery by cardiology and endocrinology.     Relevant past medical, surgical, family, and social history reviewed and updated as indicated.  Allergies and medications reviewed and updated. Data reviewed: Chart in Epic.   Past Medical History:  Diagnosis Date   Cardiomyopathy ([place])    Diabetes ([place])    Gout    High cholesterol    Hypertension    Low testosterone 04/17/2021   Malignant neoplasm of thyroid gland ([place]) 04/17/2016   Postsurgical hypothyroidism    thyroid cancer   Vitamin D insufficiency     Past Surgical History:  Procedure Laterality Date   EYE SURGERY Left    cataract removal   FRACTURE SURGERY Left 06/23/1987   ankle   THYROIDECTOMY N/A 03/03/2013   Procedure: TOTAL THYROIDECTOMY;  Surgeon: Jamesetta So, MD;  Location: [facility];  Service: General;  Laterality: N/A;   TOTAL HIP ARTHROPLASTY Right 07/28/2021    Social History   Socioeconomic History   Marital status: Single    Spouse name: Not on file   Number of children: Not on file   Years of education: Not on file   Highest education level: Not on file  Occupational History   Not on file  Tobacco Use   Smoking status:  Every Day    Packs/day: 0.50    Years: 10.00    Total pack years: 5.00    Types: Cigarettes    Last attempt to quit: 11/27/2016    Years since quitting: 5.7   Smokeless tobacco: Former  Scientific laboratory technician Use: Never used  Substance and Sexual Activity   Alcohol use: Yes    Comment: occasional   Drug use: No   Sexual activity: Yes    Birth control/protection: None  Other Topics Concern   Not on file  Social History Narrative   Works outside   Investment banker, operational of Radio broadcast assistant Strain: [place]  (01/20/2022)   Overall Financial Resource Strain (CARDIA)    Difficulty of Paying Living Expenses: Not hard at all  Food Insecurity: No Food Insecurity (03/10/2022)   Hunger Vital Sign    Worried About Running Out of Food in the Last Year: Never true    [place] in the Last Year: Never true  Transportation Needs: No Transportation Needs (03/10/2022)   PRAPARE - Hydrologist (Medical): No    Lack of Transportation (Non-Medical): No  Physical Activity: Not on file  Stress: Not on file  Social Connections: Not on file  Intimate Partner Violence: Not At Risk (01/20/2022)   Humiliation, Afraid, Rape, and Kick questionnaire    Fear of Current or Ex-Partner:  No    Emotionally Abused: No    Physically Abused: No    Sexually Abused: No    Outpatient Encounter Medications as of 08/13/2022  Medication Sig   aspirin 81 MG EC tablet Take 1 tablet by mouth daily.   celecoxib (CELEBREX) 200 MG capsule Take 200 mg by mouth daily.   colchicine 0.6 MG tablet TAKE 2 TABLETS AT ONSET, THEN 1 TABLET 2 HOURS LATER- A MAXIMUM OF 3 TABLETS IN 24 HOURS   ENTRESTO 97-103 MG Take 1 tablet by mouth 2 (two) times daily.   levothyroxine (SYNTHROID) 200 MCG tablet TAKE 1 TABLET EVERY MORNING. HE HAS A SEPARATE RX FOR 25 MCG TO MAKE A TOTAL DAILY DOSE OF 225MCG.   magnesium oxide (MAG-OX) 400 MG tablet Take by mouth.   meloxicam (MOBIC) 15 MG tablet TAKE 1 TABLET  (15 MG TOTAL) BY MOUTH DAILY.   metFORMIN (GLUCOPHAGE) 1000 MG tablet Take 1 tablet by mouth 2 (two) times daily with a meal.   methocarbamol (ROBAXIN) 500 MG tablet TAKE 1 TABLET BY MOUTH EVERY 6 HOURS AS NEEDED FOR MUSCLE SPASM   mupirocin ointment (BACTROBAN) 2 % Apply topically.   nystatin cream (MYCOSTATIN) APPLY 1 APPLICATION TOPICALLY TWO (2) TIMES A DAY.   omega-3 acid ethyl esters (LOVAZA) 1 g capsule TAKE 2 CAPSULES BY MOUTH 2 TIMES DAILY.   pravastatin (PRAVACHOL) 40 MG tablet TAKE 1 TABLET BY MOUTH EVERY DAY   valACYclovir (VALTREX) 1000 MG tablet Take by mouth.   isosorbide mononitrate (IMDUR) 30 MG 24 hr tablet Take 30 mg by mouth 3 (three) times daily. (Patient not taking: Reported on 08/13/2022)   isosorbide mononitrate (IMDUR) 30 MG 24 hr tablet Take 1 tablet by mouth daily. (Patient not taking: Reported on 08/13/2022)   metoprolol succinate (TOPROL-XL) 100 MG 24 hr tablet Take 1 tablet (100 mg total) by mouth daily after breakfast. (Patient not taking: Reported on 08/13/2022)   [DISCONTINUED] aspirin 81 MG chewable tablet CHEW 1 TABLET BY MOUTH TWICE A DAY AS DIRECTED FOR 30 DAYS.   [DISCONTINUED] azithromycin (ZITHROMAX) 250 MG tablet Take 1 tablet (250 mg total) by mouth daily.   [DISCONTINUED] celecoxib (CELEBREX) 200 MG capsule Take by mouth daily.   [DISCONTINUED] Docusate Sodium (DSS) 100 MG CAPS TAKE 1 CAPSULE BY MOUTH TWICE A DAY AS DIRECTED   [DISCONTINUED] HYDROcodone-acetaminophen (NORCO) 10-325 MG tablet Take 1/2-1 tablet by mouth every 6 hours as needed for pain   [DISCONTINUED] HYDROcodone-acetaminophen (NORCO/VICODIN) 5-325 MG tablet Take 1-2 tablets by mouth every 12 hours as needed for severe pain   [DISCONTINUED] ipratropium (ATROVENT) 0.06 % nasal spray USE 2 SPRAYS INTO EACH NOSTRIL THREE (3) TIMES A DAY.   [DISCONTINUED] oseltamivir (TAMIFLU) 75 MG capsule Take 1 capsule (75 mg total) by mouth every 12 (twelve) hours.   [DISCONTINUED] polyethylene glycol  (MIRALAX) 17 g packet Take 1 packet every day by oral route.   [DISCONTINUED] polyethylene glycol (MIRALAX) 17 g packet Miralax 17 gram oral powder packet  Take 1 packet every day by oral route.   [DISCONTINUED] sacubitril-valsartan (ENTRESTO) 97-103 MG Take 1 tablet by mouth 2 (two) times daily.   [DISCONTINUED] terbinafine (LAMISIL) 250 MG tablet Take 1 tablet by mouth daily.   No facility-administered encounter medications on file as of 08/13/2022.    Allergies  Allergen Reactions   Isosorbide Other (See Comments)    Low blood pressure    Review of Systems  Constitutional:  Negative for activity change, appetite change, chills, diaphoresis, fatigue  and fever.  HENT: Negative.    Eyes: Negative.  Negative for photophobia, redness and visual disturbance.  Respiratory:  Negative for cough, chest tightness and shortness of breath.   Cardiovascular:  Negative for chest pain, palpitations and leg swelling.  Gastrointestinal:  Negative for abdominal pain, blood in stool, constipation, diarrhea, nausea and vomiting.  Endocrine: Negative.  Negative for polydipsia, polyphagia and polyuria.  Genitourinary:  Negative for decreased urine volume, difficulty urinating, dysuria, frequency and urgency.  Musculoskeletal:  Positive for arthralgias and gait problem. Negative for myalgias.  Skin: Negative.   Allergic/Immunologic: Negative.   Neurological:  Negative for dizziness and headaches.  Hematological: Negative.   Psychiatric/Behavioral:  Negative for confusion, hallucinations, sleep disturbance and suicidal ideas.   All other systems reviewed and are negative.       Objective:  BP 125/87   Pulse 90   Temp 98.1 F (36.7 C) (Oral)   Ht 5' 10"$  (1.778 m)   Wt 281 lb 9.6 oz (127.7 kg)   SpO2 97%   BMI 40.41 kg/m    Wt Readings from Last 3 Encounters:  08/13/22 281 lb 9.6 oz (127.7 kg)  06/30/22 291 lb 3.2 oz (132.1 kg)  06/18/22 275 lb (124.7 kg)    Physical Exam Vitals and  nursing note reviewed.  Constitutional:      General: He is not in acute distress.    Appearance: Normal appearance. He is well-developed and well-groomed. He is obese. He is not ill-appearing, toxic-appearing or diaphoretic.  HENT:     Head: Normocephalic and atraumatic.     Jaw: There is normal jaw occlusion.     Right Ear: Hearing normal.     Left Ear: Hearing normal.     Nose: Nose normal.     Mouth/Throat:     Lips: Pink.     Mouth: Mucous membranes are moist.     Pharynx: Oropharynx is clear. Uvula midline.  Eyes:     General: Lids are normal.     Extraocular Movements: Extraocular movements intact.     Conjunctiva/sclera: Conjunctivae normal.     Pupils: Pupils are equal, round, and reactive to light.  Neck:     Thyroid: No thyroid mass, thyromegaly or thyroid tenderness.     Vascular: No carotid bruit or JVD.     Trachea: Trachea and phonation normal.  Cardiovascular:     Rate and Rhythm: Normal rate and regular rhythm.     Chest Wall: PMI is not displaced.     Pulses: Normal pulses.     Heart sounds: Murmur heard.     Systolic murmur is present with a grade of 1/6.     No friction rub. No gallop.  Pulmonary:     Effort: Pulmonary effort is normal. No respiratory distress.     Breath sounds: Decreased breath sounds (minimally at bases) present. No wheezing, rhonchi or rales.  Abdominal:     General: Bowel sounds are normal. There is no distension or abdominal bruit.     Palpations: Abdomen is soft. There is no hepatomegaly or splenomegaly.     Tenderness: There is no abdominal tenderness. There is no right CVA tenderness or left CVA tenderness.     Hernia: No hernia is present.  Musculoskeletal:        General: Normal range of motion.     Cervical back: Normal range of motion and neck supple.     Right lower leg: No edema.     Left lower leg:  No edema.  Lymphadenopathy:     Cervical: No cervical adenopathy.  Skin:    General: Skin is warm and dry.     Capillary  Refill: Capillary refill takes less than 2 seconds.     Coloration: Skin is not cyanotic, jaundiced or pale.     Findings: No rash.  Neurological:     General: No focal deficit present.     Mental Status: He is alert and oriented to person, place, and time.     Sensory: Sensation is intact.     Motor: Motor function is intact.     Coordination: Coordination is intact.     Gait: Gait is intact.     Deep Tendon Reflexes: Reflexes are normal and symmetric.  Psychiatric:        Attention and Perception: Attention and perception normal.        Mood and Affect: Mood and affect normal.        Speech: Speech normal.        Behavior: Behavior normal. Behavior is cooperative.        Thought Content: Thought content normal.        Cognition and Memory: Cognition and memory normal.        Judgment: Judgment normal.     Results for orders placed or performed in visit on 06/30/22  HgB A1c  Result Value Ref Range   Hemoglobin A1C     HbA1c POC (<> result, manual entry)     HbA1c, POC (prediabetic range)     HbA1c, POC (controlled diabetic range) 8.4 (A) 0.0 - 7.0 %       Pertinent labs & imaging results that were available during my care of the patient were reviewed by me and considered in my medical decision making.  Assessment & Plan:  Tyler [name] was seen today for pre-op exam.  Diagnoses and all orders for this visit:  Preoperative clearance Has been cleared by cardiology and endocrinology. Cleared from a medical standpoint. Aware if forms need to be signed, to fax them to office.     Continue all other maintenance medications.  Follow up plan: Return in about 6 months (around 02/11/2023) for CPE.   Continue healthy lifestyle choices, including diet (rich in fruits, vegetables, and lean proteins, and low in salt and simple carbohydrates) and exercise (at least 30 minutes of moderate physical activity daily).   The above assessment and management plan was discussed with the patient.  The patient verbalized understanding of and has agreed to the management plan. Patient is aware to call the clinic if they develop any new symptoms or if symptoms persist or worsen. Patient is aware when to return to the clinic for a follow-up visit. Patient educated on when it is appropriate to go to the emergency department.   Monia Pouch, FNP-C [facility] [phone]

## 2022-08-14 ENCOUNTER — Other Ambulatory Visit: Payer: Self-pay | Admitting: "Endocrinology

## 2022-08-14 DIAGNOSIS — E119 Type 2 diabetes mellitus without complications: Secondary | ICD-10-CM

## 2022-08-21 DIAGNOSIS — M87059 Idiopathic aseptic necrosis of unspecified femur: Secondary | ICD-10-CM | POA: Insufficient documentation

## 2022-08-26 NOTE — Patient Instructions (Addendum)
SURGICAL WAITING ROOM VISITATION Patients having surgery or a procedure may have no more than 2 support people in the waiting area - these visitors may rotate.    If the patient needs to stay at the hospital during part of their recovery, the visitor guidelines for inpatient rooms apply. Pre-op nurse will coordinate an appropriate time for 1 support person to accompany patient in pre-op.  This support person may not rotate.    Please refer to the [facility] website for the visitor guidelines for Inpatients (after your surgery is over and you are in a regular room).   Due to an increase in RSV and influenza rates and associated hospitalizations, children ages 33 and under may not visit patients in [place].     Your procedure is scheduled on: 09-08-22   Report to [facility] Main Entrance    Report to admitting at 6:30 AM   Call this number if you have problems the morning of surgery [phone]   Do not eat food :After Midnight.   After Midnight you may have the following liquids until 5:50 AM DAY OF SURGERY  Water Non-Citrus Juices (without pulp, NO RED) Carbonated Beverages Black Coffee (NO MILK/CREAM OR CREAMERS, sugar ok)  Clear Tea (NO MILK/CREAM OR CREAMERS, sugar ok) regular and decaf                             Plain Jell-O (NO RED)                                           Fruit ices (not with fruit pulp, NO RED)                                     Popsicles (NO RED)                                                               Sports drinks like Gatorade (NO RED)                    The day of surgery:  Drink ONE (1) Pre-Surgery G2 at 5:50 AM the morning of surgery. Drink in one sitting. Do not sip.  This drink was given to you during your hospital  pre-op appointment visit. Nothing else to drink after completing the Pre-Surgery G2.          If you have questions, please contact your surgeon's office.   FOLLOW  ANY ADDITIONAL PRE OP  INSTRUCTIONS YOU RECEIVED FROM YOUR SURGEON'S OFFICE!!!     Oral Hygiene is also important to reduce your risk of infection.                                    Remember - BRUSH YOUR TEETH THE MORNING OF SURGERY WITH YOUR REGULAR TOOTHPASTE   Do NOT smoke after Midnight   Take these medicines the morning of surgery with A SIP OF WATER:   Carvedilol  Colchicine  Levothyroxine  Valacyclovir  How to Manage Your Diabetes Before and After Surgery  Why is it important to control my blood sugar before and after surgery? Improving blood sugar levels before and after surgery helps healing and can limit problems. A way of improving blood sugar control is eating a healthy diet by:  Eating less sugar and carbohydrates  Increasing activity/exercise  Talking with your doctor about reaching your blood sugar goals High blood sugars (greater than 180 mg/dL) can raise your risk of infections and slow your recovery, so you will need to focus on controlling your diabetes during the weeks before surgery. Make sure that the doctor who takes care of your diabetes knows about your planned surgery including the date and location.  How do I manage my blood sugar before surgery? Check your blood sugar at least 4 times a day, starting 2 days before surgery, to make sure that the level is not too high or low. Check your blood sugar the morning of your surgery when you wake up and every 2 hours until you get to the Short Stay unit. If your blood sugar is less than 70 mg/dL, you will need to treat for low blood sugar: Do not take insulin. Treat a low blood sugar (less than 70 mg/dL) with  cup of clear juice (cranberry or apple), 4 glucose tablets, OR glucose gel. Recheck blood sugar in 15 minutes after treatment (to make sure it is greater than 70 mg/dL). If your blood sugar is not greater than 70 mg/dL on recheck, call [phone] for further instructions. Report your blood sugar to the short stay nurse when you  get to Short Stay.  If you are admitted to the hospital after surgery: Your blood sugar will be checked by the staff and you will probably be given insulin after surgery (instead of oral diabetes medicines) to make sure you have good blood sugar levels. The goal for blood sugar control after surgery is 80-180 mg/dL.   WHAT DO I DO ABOUT MY DIABETES MEDICATION?  Do not take oral diabetes medicines (pills) the morning of surgery. (Hold Metformin the morning of surgery)  DO NOT TAKE THE FOLLOWING 7 DAYS PRIOR TO SURGERY: Ozempic, Wegovy, Rybelsus (Semaglutide), Byetta (exenatide), Bydureon (exenatide ER), Victoza, Saxenda (liraglutide), or Trulicity (dulaglutide) Mounjaro (Tirzepatide) Adlyxin (Lixisenatide), Polyethylene Glycol Loxenatide.   Reviewed and Endorsed by [facility] Patient Education Committee, August 2015                              You may not have any metal on your body including jewelry, and body piercing             Do not wear lotions, powders, cologne, or deodorant              Men may shave face and neck.   Do not bring valuables to the hospital. [place].   Contacts, dentures or bridgework may not be worn into surgery.  DO NOT [place]. PHARMACY WILL DISPENSE MEDICATIONS LISTED ON YOUR MEDICATION LIST TO YOU DURING YOUR ADMISSION [place]!    Patients discharged on the day of surgery will not be allowed to drive home.  Someone NEEDS to stay with you for the first 24 hours after anesthesia.              Please read over  the following fact sheets you were given: IF YOU HAVE QUESTIONS ABOUT YOUR PRE-OP INSTRUCTIONS PLEASE CALL [phone] [place]  If you received a COVID test during your pre-op visit  it is requested that you wear a mask when out in public, stay away from anyone that may not be feeling well and notify your surgeon if you develop symptoms. If you test positive for Covid or  have been in contact with anyone that has tested positive in the last 10 days please notify you surgeon.  [place] - Preparing for Surgery Before surgery, you can play an important role.  Because skin is not sterile, your skin needs to be as free of germs as possible.  You can reduce the number of germs on your skin by washing with CHG (chlorahexidine gluconate) soap before surgery.  CHG is an antiseptic cleaner which kills germs and bonds with the skin to continue killing germs even after washing. Please DO NOT use if you have an allergy to CHG or antibacterial soaps.  If your skin becomes reddened/irritated stop using the CHG and inform your nurse when you arrive at Short Stay. Do not shave (including legs and underarms) for at least 48 hours prior to the first CHG shower.  You may shave your face/neck.  Please follow these instructions carefully:  1.  Shower with CHG Soap the night before surgery and the  morning of surgery.  2.  If you choose to wash your hair, wash your hair first as usual with your normal  shampoo.  3.  After you shampoo, rinse your hair and body thoroughly to remove the shampoo.                             4.  Use CHG as you would any other liquid soap.  You can apply chg directly to the skin and wash.  Gently with a scrungie or clean washcloth.  5.  Apply the CHG Soap to your body ONLY FROM THE NECK DOWN.   Do   not use on face/ open                           Wound or open sores. Avoid contact with eyes, ears mouth and   genitals (private parts).                       Wash face,  Genitals (private parts) with your normal soap.             6.  Wash thoroughly, paying special attention to the area where your    surgery  will be performed.  7.  Thoroughly rinse your body with warm water from the neck down.  8.  DO NOT shower/wash with your normal soap after using and rinsing off the CHG Soap.                9.  Pat yourself dry with a clean towel.            10.  Wear  clean pajamas.            11.  Place clean sheets on your bed the night of your first shower and do not  sleep with pets. Day of Surgery : Do not apply any lotions/deodorants the morning of surgery.  Please wear clean clothes to the hospital/surgery center.  FAILURE TO FOLLOW THESE  INSTRUCTIONS MAY RESULT IN THE CANCELLATION OF YOUR SURGERY  PATIENT SIGNATURE_________________________________  NURSE SIGNATURE__________________________________  ________________________________________________________________________    Tyler [name]  An incentive spirometer is a tool that can help keep your lungs clear and active. This tool measures how well you are filling your lungs with each breath. Taking long deep breaths may help reverse or decrease the chance of developing breathing (pulmonary) problems (especially infection) following: A long period of time when you are unable to move or be active. BEFORE THE PROCEDURE  If the spirometer includes an indicator to show your best effort, your nurse or respiratory therapist will set it to a desired goal. If possible, sit up straight or lean slightly forward. Try not to slouch. Hold the incentive spirometer in an upright position. INSTRUCTIONS FOR USE  Sit on the edge of your bed if possible, or sit up as far as you can in bed or on a chair. Hold the incentive spirometer in an upright position. Breathe out normally. Place the mouthpiece in your mouth and seal your lips tightly around it. Breathe in slowly and as deeply as possible, raising the piston or the ball toward the top of the column. Hold your breath for 3-5 seconds or for as long as possible. Allow the piston or ball to fall to the bottom of the column. Remove the mouthpiece from your mouth and breathe out normally. Rest for a few seconds and repeat Steps 1 through 7 at least 10 times every 1-2 hours when you are awake. Take your time and take a few normal breaths between deep  breaths. The spirometer may include an indicator to show your best effort. Use the indicator as a goal to work toward during each repetition. After each set of 10 deep breaths, practice coughing to be sure your lungs are clear. If you have an incision (the cut made at the time of surgery), support your incision when coughing by placing a pillow or rolled up towels firmly against it. Once you are able to get out of bed, walk around indoors and cough well. You may stop using the incentive spirometer when instructed by your caregiver.  RISKS AND COMPLICATIONS Take your time so you do not get dizzy or light-headed. If you are in pain, you may need to take or ask for pain medication before doing incentive spirometry. It is harder to take a deep breath if you are having pain. AFTER USE Rest and breathe slowly and easily. It can be helpful to keep track of a log of your progress. Your caregiver can provide you with a simple table to help with this. If you are using the spirometer at home, follow these instructions: [place] IF:  You are having difficultly using the spirometer. You have trouble using the spirometer as often as instructed. Your pain medication is not giving enough relief while using the spirometer. You develop fever of 100.5 F (38.1 C) or higher. SEEK IMMEDIATE MEDICAL CARE IF:  You cough up bloody sputum that had not been present before. You develop fever of 102 F (38.9 C) or greater. You develop worsening pain at or near the incision site. MAKE SURE YOU:  Understand these instructions. Will watch your condition. Will get help right away if you are not doing well or get worse. Document Released: 10/19/2006 Document Revised: 08/31/2011 Document Reviewed: 12/20/2006 ExitCare Patient Information 2014 ExitCare, [place].   ________________________________________________________________________ WHAT IS A BLOOD TRANSFUSION? Blood Transfusion Information  A transfusion is  the replacement of  blood or some of its parts. Blood is made up of multiple cells which provide different functions. Red blood cells carry oxygen and are used for blood loss replacement. White blood cells fight against infection. Platelets control bleeding. Plasma helps clot blood. Other blood products are available for specialized needs, such as hemophilia or other clotting disorders. BEFORE THE TRANSFUSION  Who gives blood for transfusions?  Healthy volunteers who are fully evaluated to make sure their blood is safe. This is blood bank blood. Transfusion therapy is the safest it has ever been in the practice of medicine. Before blood is taken from a donor, a complete history is taken to make sure that person has no history of diseases nor engages in risky social behavior (examples are intravenous drug use or sexual activity with multiple partners). The donor's travel history is screened to minimize risk of transmitting infections, such as malaria. The donated blood is tested for signs of infectious diseases, such as HIV and hepatitis. The blood is then tested to be sure it is compatible with you in order to minimize the chance of a transfusion reaction. If you or a relative donates blood, this is often done in anticipation of surgery and is not appropriate for emergency situations. It takes many days to process the donated blood. RISKS AND COMPLICATIONS Although transfusion therapy is very safe and saves many lives, the main dangers of transfusion include:  Getting an infectious disease. Developing a transfusion reaction. This is an allergic reaction to something in the blood you were given. Every precaution is taken to prevent this. The decision to have a blood transfusion has been considered carefully by your caregiver before blood is given. Blood is not given unless the benefits outweigh the risks. AFTER THE TRANSFUSION Right after receiving a blood transfusion, you will usually feel much better  and more energetic. This is especially true if your red blood cells have gotten low (anemic). The transfusion raises the level of the red blood cells which carry oxygen, and this usually causes an energy increase. The nurse administering the transfusion will monitor you carefully for complications. HOME CARE INSTRUCTIONS  No special instructions are needed after a transfusion. You may find your energy is better. Speak with your caregiver about any limitations on activity for underlying diseases you may have. SEEK MEDICAL CARE IF:  Your condition is not improving after your transfusion. You develop redness or irritation at the intravenous (IV) site. SEEK IMMEDIATE MEDICAL CARE IF:  Any of the following symptoms occur over the next 12 hours: Shaking chills. You have a temperature by mouth above 102 F (38.9 C), not controlled by medicine. Chest, back, or muscle pain. People around you feel you are not acting correctly or are confused. Shortness of breath or difficulty breathing. Dizziness and fainting. You get a rash or develop hives. You have a decrease in urine output. Your urine turns a dark color or changes to pink, red, or brown. Any of the following symptoms occur over the next 10 days: You have a temperature by mouth above 102 F (38.9 C), not controlled by medicine. Shortness of breath. Weakness after normal activity. The white part of the eye turns yellow (jaundice). You have a decrease in the amount of urine or are urinating less often. Your urine turns a dark color or changes to pink, red, or brown. Document Released: 06/05/2000 Document Revised: 08/31/2011 Document Reviewed: 01/23/2008 [facility] Patient Information 2014 [place], [place].  _______________________________________________________________________

## 2022-08-26 NOTE — Progress Notes (Addendum)
COVID Vaccine Completed:  Yes  Date of COVID positive in last 90 days:  No  PCP - Darla Lesches, FNP Cardiologist - Marina Goodell, MD  Medical clearance in Epic dated 08-13-22 by Monia Pouch, FNO-C  Chest x-ray - 06-18-22 Epic EKG - 01-15-22 [facility], copy on chart Stress Test -  N/A ECHO - 04-03-22 [facility] Cardiac Cath - N/A Pacemaker/ICD device last checked: Spinal Cord Stimulator:  N/A Holter Monitor - 02-03-21 [facility] Coronary CT - 01-10-21 [facility]  Bowel Prep - N/A  Sleep Study - Yes, mild sleep apnea CPAP - No  Fasting Blood Sugar -  Checks Blood Sugar - does not check   Last dose of GLP1 agonist-  N/A GLP1 instructions:  N/A   Last dose of SGLT-2 inhibitors-  N/A SGLT-2 instructions: N/A  Blood Thinner Instructions: Aspirin Instructions:  ASA 81.  Per patient to stop 7 days before surgery  Last Dose:  Activity level:  Can go up a flight of stairs and perform activities of daily living without stopping and without symptoms of chest pain or shortness of breath.  Some difficulty due to hip pain  Anesthesia review:  CAD, nonischemic cardiomyopathy, CHF,  HTN, DM  BP 150/123 and on recheck 161/120 at PAT.  Patient denied headache, chest pain, SOB.  Patient stated that this is not abnormal for him.  He does monitor at home and gets number 120 to 150/90s.  Jessica Ward, PA-C made aware.  Patient will  monitor at home and call his cardiologist if  it remains elevated and he is aware that surgery can be canceled due to elevated BP.  Patient denies shortness of breath, fever, cough and chest pain at PAT appointment  Patient verbalized understanding of instructions that were given to them at the PAT appointment. Patient was also instructed that they will need to review over the PAT instructions again at home before surgery.

## 2022-08-28 ENCOUNTER — Encounter (HOSPITAL_COMMUNITY): Payer: Self-pay

## 2022-08-28 ENCOUNTER — Other Ambulatory Visit: Payer: Self-pay

## 2022-08-28 ENCOUNTER — Encounter (HOSPITAL_COMMUNITY)
Admission: RE | Admit: 2022-08-28 | Discharge: 2022-08-28 | Disposition: A | Discharge status: Home / Self Care | Payer: BC Managed Care – PPO | Source: Ambulatory Visit | Attending: Orthopedic Surgery | Admitting: Orthopedic Surgery

## 2022-08-28 VITALS — BP 161/120 | HR 89 | Temp 98.2°F | Resp 20 | Ht 70.0 in | Wt 279.8 lb

## 2022-08-28 DIAGNOSIS — Z01812 Encounter for preprocedural laboratory examination: Secondary | ICD-10-CM | POA: Insufficient documentation

## 2022-08-28 DIAGNOSIS — Z7984 Long term (current) use of oral hypoglycemic drugs: Secondary | ICD-10-CM | POA: Insufficient documentation

## 2022-08-28 DIAGNOSIS — F17210 Nicotine dependence, cigarettes, uncomplicated: Secondary | ICD-10-CM | POA: Insufficient documentation

## 2022-08-28 DIAGNOSIS — I110 Hypertensive heart disease with heart failure: Secondary | ICD-10-CM | POA: Insufficient documentation

## 2022-08-28 DIAGNOSIS — I10 Essential (primary) hypertension: Secondary | ICD-10-CM

## 2022-08-28 DIAGNOSIS — E119 Type 2 diabetes mellitus without complications: Secondary | ICD-10-CM | POA: Diagnosis not present

## 2022-08-28 DIAGNOSIS — M87052 Idiopathic aseptic necrosis of left femur: Secondary | ICD-10-CM | POA: Insufficient documentation

## 2022-08-28 DIAGNOSIS — M1612 Unilateral primary osteoarthritis, left hip: Secondary | ICD-10-CM | POA: Diagnosis not present

## 2022-08-28 DIAGNOSIS — M87852 Other osteonecrosis, left femur: Secondary | ICD-10-CM | POA: Diagnosis not present

## 2022-08-28 DIAGNOSIS — I509 Heart failure, unspecified: Secondary | ICD-10-CM | POA: Insufficient documentation

## 2022-08-28 DIAGNOSIS — M25552 Pain in left hip: Secondary | ICD-10-CM | POA: Diagnosis not present

## 2022-08-28 DIAGNOSIS — I2510 Atherosclerotic heart disease of native coronary artery without angina pectoris: Secondary | ICD-10-CM | POA: Diagnosis not present

## 2022-08-28 DIAGNOSIS — Z01818 Encounter for other preprocedural examination: Secondary | ICD-10-CM

## 2022-08-28 HISTORY — DX: Other complications of anesthesia, initial encounter: T88.59XA

## 2022-08-28 HISTORY — DX: Heart failure, unspecified: I50.9

## 2022-08-28 HISTORY — DX: Unspecified osteoarthritis, unspecified site: M19.90

## 2022-08-28 HISTORY — DX: Atherosclerotic heart disease of native coronary artery without angina pectoris: I25.10

## 2022-08-28 HISTORY — DX: Pneumonia, unspecified organism: J18.9

## 2022-08-28 HISTORY — DX: Sleep apnea, unspecified: G47.30

## 2022-08-28 LAB — TYPE AND SCREEN
ABO/RH(D): O NEG
Antibody Screen: NEGATIVE

## 2022-08-28 LAB — CBC
HCT: 42.3 % (ref 39.0–52.0)
Hemoglobin: 13.8 g/dL (ref 13.0–17.0)
MCH: 29.9 pg (ref 26.0–34.0)
MCHC: 32.6 g/dL (ref 30.0–36.0)
MCV: 91.6 fL (ref 80.0–100.0)
Platelets: 270 10*3/uL (ref 150–400)
RBC: 4.62 MIL/uL (ref 4.22–5.81)
RDW: 12.8 % (ref 11.5–15.5)
WBC: 9 10*3/uL (ref 4.0–10.5)
nRBC: 0 % (ref 0.0–0.2)

## 2022-08-28 LAB — GLUCOSE, CAPILLARY: Glucose-Capillary: 187 mg/dL — ABNORMAL HIGH (ref 70–99)

## 2022-08-28 LAB — BASIC METABOLIC PANEL
Anion gap: 11 (ref 5–15)
BUN: 10 mg/dL (ref 6–20)
CO2: 21 mmol/L — ABNORMAL LOW (ref 22–32)
Calcium: 9.2 mg/dL (ref 8.9–10.3)
Chloride: 108 mmol/L (ref 98–111)
Creatinine, Ser: 0.78 mg/dL (ref 0.61–1.24)
GFR, Estimated: >60 mL/min (ref >60)
Glucose, Bld: 193 mg/dL — ABNORMAL HIGH (ref 70–99)
Potassium: 3.7 mmol/L (ref 3.5–5.1)
Sodium: 140 mmol/L (ref 135–145)

## 2022-08-28 LAB — SURGICAL PCR SCREEN
MRSA, PCR: NEGATIVE
Staphylococcus aureus: NEGATIVE

## 2022-08-29 LAB — HEMOGLOBIN A1C
Hgb A1c MFr Bld: 7.1 % — ABNORMAL HIGH (ref 4.8–5.6)
Mean Plasma Glucose: 157 mg/dL

## 2022-08-31 NOTE — Progress Notes (Addendum)
Anesthesia Chart Review   Case: [id number] Date/Time: 09/08/22 0815   Procedure: TOTAL HIP ARTHROPLASTY ANTERIOR APPROACH (Left: Hip)   Anesthesia type: Spinal   Pre-op diagnosis: Left hip avascular necrosis   Location: [facility] ROOM 10 / [facility]   Surgeons: Paralee Cancel, MD       [facility] smoker with h/o HTN, sleep apnea, DM II, CHF, CAD, left hip AVN scheduled for above procedure 09/08/2022 with Dr. Paralee Cancel.   Pt seen by cardiology 06/17/2022 for preoperative evaluation.  Per OV note, "Preop evaluation prior to right hip surgery - patient denies chest pain, palpitations.  Until patient was diagnosed recently with influenza and pneumonia patient was able to walk 1 mile without problem but since recent diagnosis of influenza and pneumonia she had shortness of breath on minimal exertion which is currently improving and patient can walk 100 yards without problem. Recent cardiac echo was with improved LV systolic function and EF [id number] with no significant valvular disease.  Recent coronary CT with mild to moderate CAD as above.  Recent cardiac monitor with no significant arrhythmias noted.  Patient is on carvedilol and Entresto.  Heart rate and BP remains recently under control as above. See hypertension above.  Once patient recovers fully from recent influenza and pneumonia, patient can proceed with left hip surgery-considering comorbidities patient has some risk of perioperative ischemic events but the risk does not appear high.  I would avoid fluctuation of the blood pressure or heart rate during surgery. I would keep potassium between 4.0 and 5.0 and magnesium between 2.0 and 2.5." This was three months ago, pt asymptomatic from respiratory standpoint.   Elevated BP at PAT visit.  He has been cleared by both cardiology and PCP.  Blood pressure readings normal at these visit.  He reports fluctuations with BP.  Advised to contact cardiologist.  Evaluate DOS.  BP Readings from Last 3  Encounters:  08/28/22 (!) 161/120  08/13/22 125/87  06/30/22 (!) 140/112    VS: BP (!) 161/120   Pulse 89   Temp 36.8 C (Oral)   Resp 20   Ht '5\' 10"'$  (1.778 m)   Wt 126.9 kg   SpO2 98%   BMI 40.15 kg/m   PROVIDERS: Baruch Gouty, FNP is PCP   Cardiologist - Marina Goodell, MD  LABS: Labs reviewed: Acceptable for surgery. (all labs ordered are listed, but only abnormal results are displayed)  Labs Reviewed  HEMOGLOBIN A1C - Abnormal; Notable for the following components:      Result Value   Hgb A1c MFr Bld 7.1 (*)    All other components within normal limits  BASIC METABOLIC PANEL - Abnormal; Notable for the following components:   CO2 21 (*)    Glucose, Bld 193 (*)    All other components within normal limits  GLUCOSE, CAPILLARY - Abnormal; Notable for the following components:   Glucose-Capillary 187 (*)    All other components within normal limits  SURGICAL PCR SCREEN  CBC  TYPE AND SCREEN     IMAGES:   EKG:   CV: Echo 04/03/2022 Impression  Aorta: The aortic root is borderline dilated- 3.7cm. The ascending aorta is mildly dilated-3.9cm. Aortic arch appears mildly dilated - 3.55cm.   Left Ventricle: There is mild global hypokinesis of the left ventricle.   Left Ventricle: Systolic function is mildly abnormal. EF: 45-49%.   Left Ventricle: There is moderate to severe concentric hypertrophy.   Left Ventricle: Doppler parameters are indeterminate for diastolic function,  but Grade 1 diastolic dysfunction more likely present.   Tricuspid Valve: The right ventricular systolic pressure is normal (<36 mmHg).  No significant valvular disease was noted.  Compare to previous study from September, 2022 today's study revealed slightly improved LV EF from 40 to 45% by previous study to 45-49% by today's study.  Aortic root appears more dilated on current study. By previous study aortic root was 3.4cm. Patient has known history of nonischemic cardiomyopathy  with LVEF 30 to 35% from December 2021. Past Medical History:  Diagnosis Date   Arthritis    Cardiomyopathy [facility])    CHF (congestive heart failure) ([place])    Complication of anesthesia    Woke up during surgery   Coronary artery disease    Diabetes ([place])    Gout    High cholesterol    Hypertension    Low testosterone 04/17/2021   Malignant neoplasm of thyroid gland ([place]) 04/17/2016   Pneumonia    Postsurgical hypothyroidism    thyroid cancer   Sleep apnea    Vitamin D insufficiency     Past Surgical History:  Procedure Laterality Date   EYE SURGERY Left    cataract removal   FRACTURE SURGERY Left 06/23/1987   ankle   THYROIDECTOMY N/A 03/03/2013   Procedure: TOTAL THYROIDECTOMY;  Surgeon: Jamesetta So, MD;  Location: [facility];  Service: General;  Laterality: N/A;   TOTAL HIP ARTHROPLASTY Right 07/28/2021    MEDICATIONS:  acetaminophen (TYLENOL) 500 MG tablet   aspirin 81 MG EC tablet   carvedilol (COREG) 3.125 MG tablet   celecoxib (CELEBREX) 200 MG capsule   colchicine 0.6 MG tablet   ENTRESTO 97-103 MG   levothyroxine (SYNTHROID) 200 MCG tablet   magnesium oxide (MAG-OX) 400 MG tablet   meloxicam (MOBIC) 15 MG tablet   metFORMIN (GLUCOPHAGE) 1000 MG tablet   methocarbamol (ROBAXIN) 500 MG tablet   metoprolol succinate (TOPROL-XL) 100 MG 24 hr tablet   mupirocin ointment (BACTROBAN) 2 %   nystatin (MYCOSTATIN/NYSTOP) powder   nystatin cream (MYCOSTATIN)   omega-3 acid ethyl esters (LOVAZA) 1 g capsule   pravastatin (PRAVACHOL) 40 MG tablet   TURMERIC CURCUMIN PO   valACYclovir (VALTREX) 1000 MG tablet   Vitamin D, Ergocalciferol, (DRISDOL) 1.25 MG (50000 UNIT) CAPS capsule   No current facility-administered medications for this encounter.     Konrad Felix Ward, PA-C [facility] Pre-Surgical Testing [phone]

## 2022-09-01 ENCOUNTER — Other Ambulatory Visit: Payer: Self-pay | Admitting: Family Medicine

## 2022-09-01 ENCOUNTER — Other Ambulatory Visit: Payer: Self-pay | Admitting: "Endocrinology

## 2022-09-01 DIAGNOSIS — M2550 Pain in unspecified joint: Secondary | ICD-10-CM

## 2022-09-01 DIAGNOSIS — E119 Type 2 diabetes mellitus without complications: Secondary | ICD-10-CM

## 2022-09-07 ENCOUNTER — Encounter (HOSPITAL_COMMUNITY): Payer: Self-pay | Admitting: Orthopedic Surgery

## 2022-09-07 NOTE — Anesthesia Preprocedure Evaluation (Addendum)
Anesthesia Evaluation  Patient identified by MRN, date of birth, ID band Patient awake    Reviewed: Allergy & Precautions, NPO status , Patient's Chart, lab work & pertinent test results, reviewed documented beta blocker date and time   History of Anesthesia Complications (+) AWARENESS UNDER ANESTHESIA and history of anesthetic complications  Airway Mallampati: IV  TM Distance: >3 FB Neck ROM: Full    Dental no notable dental hx. (+) Teeth Intact, Caps, Dental Advisory Given   Pulmonary sleep apnea and Continuous Positive Airway Pressure Ventilation , pneumonia, resolved, Current Smoker   Pulmonary exam normal breath sounds clear to auscultation       Cardiovascular hypertension, Pt. on medications and Pt. on home beta blockers + CAD and +CHF   Rhythm:Regular Rate:Bradycardia  Hx/o Non Ischemic CM  Echo most recent EF 40-45%  EKG 12/2021 NSR, LVH with QRS widening, LAD   Neuro/Psych negative neurological ROS  negative psych ROS   GI/Hepatic negative GI ROS, Neg liver ROS,,,  Endo/Other  diabetes, Poorly Controlled, Type 2, Insulin Dependent, Oral Hypoglycemic AgentsHypothyroidism  Morbid obesityHyperlipidemia Gout  Renal/GU negative Renal ROS  negative genitourinary   Musculoskeletal  (+) Arthritis , Osteoarthritis,  AVN left hip   Abdominal  (+) + obese  Peds  Hematology negative hematology ROS (+)   Anesthesia Other Findings   Reproductive/Obstetrics HSV                              Anesthesia Physical Anesthesia Plan  ASA: 3  Anesthesia Plan: Spinal   Post-op Pain Management: Minimal or no pain anticipated and Dilaudid IV   Induction: Intravenous  PONV Risk Score and Plan: 1 and Treatment may vary due to age or medical condition, Propofol infusion, Midazolam and Ondansetron  Airway Management Planned: Natural Airway and Simple Face Mask  Additional Equipment:  None  Intra-op Plan:   Post-operative Plan:   Informed Consent: I have reviewed the patients History and Physical, chart, labs and discussed the procedure including the risks, benefits and alternatives for the proposed anesthesia with the patient or authorized representative who has indicated his/her understanding and acceptance.     Dental advisory given  Plan Discussed with: Anesthesiologist and CRNA  Anesthesia Plan Comments:          Anesthesia Quick Evaluation

## 2022-09-07 NOTE — H&P (Signed)
TOTAL HIP ADMISSION H&P  Patient is admitted for left total hip arthroplasty.  Subjective:  Chief Complaint: left hip pain  HPI: Tyler [name], 47 y.o. male, has a history of pain and functional disability in the left hip(s) due to arthritis and patient has failed non-surgical conservative treatments for greater than 12 weeks to include NSAID's and/or analgesics and activity modification.  Onset of symptoms was gradual starting 2 years ago with gradually worsening course since that time.The patient noted no past surgery on the left hip(s).  Patient currently rates pain in the left hip at 8 out of 10 with activity. Patient has worsening of pain with activity and weight bearing, pain that interfers with activities of daily living, and pain with passive range of motion. Patient has evidence of joint space narrowing by imaging studies. This condition presents safety issues increasing the risk of falls. There is no current active infection.  Patient Active Problem List   Diagnosis Date Noted   Arthritis of right wrist 04/30/2022   Pain of left hip joint 03/16/2022   Coronary artery disease involving native coronary artery of native heart without angina pectoris 06/30/2021   Low testosterone 04/17/2021   Chronic fatigue 12/26/2020   Mild sleep apnea 12/26/2020   Nonischemic cardiomyopathy ([place]) 07/02/2020   Morbid obesity ([place]) 05/28/2020   Vitamin D insufficiency 02/14/2020   Type 2 diabetes mellitus with hyperglycemia, without long-term current use of insulin ([place]) 09/29/2019   Pain in finger of left hand 02/10/2019   Polyarthralgia 07/28/2016   Controlled gout 06/07/2015   Essential hypertension 03/13/2015   Postsurgical hypothyroidism 02/09/2015   Tobacco abuse 12/21/2012   Past Medical History:  Diagnosis Date   Arthritis    Cardiomyopathy [facility])    CHF (congestive heart failure) ([place])    Complication of anesthesia    Woke up during surgery   Coronary artery disease    Diabetes  ([place])    Gout    High cholesterol    Hypertension    Low testosterone 04/17/2021   Malignant neoplasm of thyroid gland ([place]) 04/17/2016   Pneumonia    Postsurgical hypothyroidism    thyroid cancer   Sleep apnea    Vitamin D insufficiency     Past Surgical History:  Procedure Laterality Date   EYE SURGERY Left    cataract removal   FRACTURE SURGERY Left 06/23/1987   ankle   THYROIDECTOMY N/A 03/03/2013   Procedure: TOTAL THYROIDECTOMY;  Surgeon: Jamesetta So, MD;  Location: [facility];  Service: General;  Laterality: N/A;   TOTAL HIP ARTHROPLASTY Right 07/28/2021    No current facility-administered medications for this encounter.   Current Outpatient Medications  Medication Sig Dispense Refill Last Dose   acetaminophen (TYLENOL) 500 MG tablet Take 1,000 mg by mouth every 6 (six) hours as needed for moderate pain.      aspirin 81 MG EC tablet Take 1 tablet by mouth daily.      carvedilol (COREG) 3.125 MG tablet Take 3.125 mg by mouth 2 (two) times daily with a meal. Hold if sbp is less than 105 or hr less than 60      colchicine 0.6 MG tablet TAKE 2 TABLETS AT ONSET, THEN 1 TABLET 2 HOURS LATER- A MAXIMUM OF 3 TABLETS IN 24 HOURS (Patient taking differently: TAKE 2 TABLETS AT ONSET, THEN 1 TABLET 2 HOURS LATER AS NEEDED FOR GOUT FLARES - A MAXIMUM OF 3 TABLETS IN 24 HOURS) 270 tablet 0    ENTRESTO 97-103  MG Take 1 tablet by mouth 2 (two) times daily.      levothyroxine (SYNTHROID) 200 MCG tablet Take 200 mcg by mouth daily before breakfast.      magnesium oxide (MAG-OX) 400 MG tablet Take 400 mg by mouth daily.      methocarbamol (ROBAXIN) 500 MG tablet Take 500 mg by mouth every 6 (six) hours as needed for muscle spasms.      mupirocin ointment (BACTROBAN) 2 % Apply 1 Application topically 2 (two) times daily as needed (irritation/infection).      nystatin (MYCOSTATIN/NYSTOP) powder Apply 1 Application topically 2 (two) times daily as needed (irritation).      nystatin cream  (MYCOSTATIN) Apply 1 Application topically 2 (two) times daily as needed (irritation).      omega-3 acid ethyl esters (LOVAZA) 1 g capsule TAKE 2 CAPSULES BY MOUTH 2 TIMES DAILY. 360 capsule 1    pravastatin (PRAVACHOL) 40 MG tablet TAKE 1 TABLET BY MOUTH EVERY DAY (Patient taking differently: Take 40 mg by mouth at bedtime.) 90 tablet 0    TURMERIC CURCUMIN PO Take 1,000 mg by mouth 2 (two) times daily.      valACYclovir (VALTREX) 1000 MG tablet Take 1,000-2,000 mg by mouth See admin instructions. Take 2000 mg at onset of fever blisters, then take 1000 mg daily as needed      Vitamin D, Ergocalciferol, (DRISDOL) 1.25 MG (50000 UNIT) CAPS capsule Take 50,000 Units by mouth every Sunday.      celecoxib (CELEBREX) 200 MG capsule Take 200 mg by mouth daily. Alternates taking daily for a few weeks with meloxicam      meloxicam (MOBIC) 15 MG tablet TAKE 1 TABLET (15 MG TOTAL) BY MOUTH DAILY. 90 tablet 0    metFORMIN (GLUCOPHAGE) 1000 MG tablet TAKE 1 TABLET (1,000 MG TOTAL) BY MOUTH TWICE A DAY WITH FOOD 180 tablet 0    metoprolol succinate (TOPROL-XL) 100 MG 24 hr tablet Take 1 tablet (100 mg total) by mouth daily after breakfast. 90 tablet 1    Allergies  Allergen Reactions   Isosorbide Other (See Comments)    Low blood pressure    Social History   Tobacco Use   Smoking status: Every Day    Packs/day: 0.50    Years: 10.00    Additional pack years: 0.00    Total pack years: 5.00    Types: Cigarettes   Smokeless tobacco: Former  Substance Use Topics   Alcohol use: Yes    Comment: occasional    Family History  Problem Relation Age of Onset   Hypertension Father    Hyperlipidemia Father      Review of Systems  Constitutional:  Negative for chills and fever.  Respiratory:  Negative for cough and shortness of breath.   Cardiovascular:  Negative for chest pain.  Gastrointestinal:  Negative for nausea and vomiting.  Musculoskeletal:  Positive for arthralgias.      Objective:  Physical Exam Well nourished and well developed. General: Alert and oriented x3, cooperative and pleasant, no acute distress. Head: normocephalic, atraumatic, neck supple. Eyes: EOMI.  Musculoskeletal: Left Hip Exam: Mild inguinal intertrigo rash in bilateral groin region Pain with passive or active hip ROM   Calves soft and nontender. Motor function intact in LE. Strength 5/5 LE bilaterally. Neuro: Distal pulses 2+. Sensation to light touch intact in LE.  Vital signs in last 24 hours:    Labs:   Estimated body mass index is 40.15 kg/m as calculated from the following:  Height as of 08/28/22: 5\' 10"  (1.778 m).   Weight as of 08/28/22: 126.9 kg.   Imaging Review Plain radiographs demonstrate severe degenerative joint disease of the left hip(s). The bone quality appears to be adequate for age and reported activity level.      Assessment/Plan:  End stage arthritis, left hip(s)  The patient history, physical examination, clinical judgement of the provider and imaging studies are consistent with end stage degenerative joint disease of the left hip(s) and total hip arthroplasty is deemed medically necessary. The treatment options including medical management, injection therapy, arthroscopy and arthroplasty were discussed at length. The risks and benefits of total hip arthroplasty were presented and reviewed. The risks due to aseptic loosening, infection, stiffness, dislocation/subluxation,  thromboembolic complications and other imponderables were discussed.  The patient acknowledged the explanation, agreed to proceed with the plan and consent was signed. Patient is being admitted for inpatient treatment for surgery, pain control, PT, OT, prophylactic antibiotics, VTE prophylaxis, progressive ambulation and ADL's and discharge planning.The patient is planning to be discharged  home.  Therapy Plans: HEP Disposition: Home with wife Planned DVT Prophylaxis: aspirin  81mg  BID DME needed: none PCP: Delight Ovens, clearance received Cardiology: Dr. Geanie Berlin, clearance received TXA: IV Allergies: Anesthesia Concerns: none BMI: 40.6 Last HgbA1c: pre-diabetic  Other: - No hx of DVT or PE - Prefers SDD - oxycodone, robaxin, tylenol, celebrex  Costella Hatcher, PA-C Orthopedic Surgery [facility] [place] [phone]

## 2022-09-08 ENCOUNTER — Encounter (HOSPITAL_COMMUNITY): Payer: Self-pay | Admitting: Orthopedic Surgery

## 2022-09-08 ENCOUNTER — Ambulatory Visit (HOSPITAL_COMMUNITY): Payer: BC Managed Care – PPO | Admitting: Physician Assistant

## 2022-09-08 ENCOUNTER — Ambulatory Visit (HOSPITAL_COMMUNITY): Payer: BC Managed Care – PPO | Admitting: Anesthesiology

## 2022-09-08 ENCOUNTER — Ambulatory Visit (HOSPITAL_COMMUNITY)
Admission: RE | Admit: 2022-09-08 | Discharge: 2022-09-08 | Disposition: A | Discharge status: Home / Self Care | Payer: BC Managed Care – PPO | Source: Ambulatory Visit | Attending: Orthopedic Surgery | Admitting: Orthopedic Surgery

## 2022-09-08 ENCOUNTER — Ambulatory Visit (HOSPITAL_COMMUNITY): Payer: BC Managed Care – PPO

## 2022-09-08 ENCOUNTER — Other Ambulatory Visit: Payer: Self-pay

## 2022-09-08 ENCOUNTER — Encounter (HOSPITAL_COMMUNITY)
Admission: RE | Disposition: A | Discharge status: Home / Self Care | Payer: Self-pay | Source: Ambulatory Visit | Attending: Orthopedic Surgery

## 2022-09-08 DIAGNOSIS — M1612 Unilateral primary osteoarthritis, left hip: Secondary | ICD-10-CM | POA: Insufficient documentation

## 2022-09-08 DIAGNOSIS — F17210 Nicotine dependence, cigarettes, uncomplicated: Secondary | ICD-10-CM | POA: Insufficient documentation

## 2022-09-08 DIAGNOSIS — Z96642 Presence of left artificial hip joint: Secondary | ICD-10-CM | POA: Diagnosis not present

## 2022-09-08 DIAGNOSIS — Z96641 Presence of right artificial hip joint: Secondary | ICD-10-CM | POA: Diagnosis not present

## 2022-09-08 DIAGNOSIS — E1165 Type 2 diabetes mellitus with hyperglycemia: Secondary | ICD-10-CM | POA: Insufficient documentation

## 2022-09-08 DIAGNOSIS — Z471 Aftercare following joint replacement surgery: Secondary | ICD-10-CM | POA: Diagnosis not present

## 2022-09-08 DIAGNOSIS — M87852 Other osteonecrosis, left femur: Secondary | ICD-10-CM | POA: Diagnosis not present

## 2022-09-08 DIAGNOSIS — Z7984 Long term (current) use of oral hypoglycemic drugs: Secondary | ICD-10-CM | POA: Diagnosis not present

## 2022-09-08 DIAGNOSIS — I2510 Atherosclerotic heart disease of native coronary artery without angina pectoris: Secondary | ICD-10-CM | POA: Insufficient documentation

## 2022-09-08 DIAGNOSIS — E119 Type 2 diabetes mellitus without complications: Secondary | ICD-10-CM

## 2022-09-08 DIAGNOSIS — Z6841 Body Mass Index (BMI) 40.0 and over, adult: Secondary | ICD-10-CM | POA: Insufficient documentation

## 2022-09-08 DIAGNOSIS — M879 Osteonecrosis, unspecified: Secondary | ICD-10-CM | POA: Insufficient documentation

## 2022-09-08 DIAGNOSIS — M25752 Osteophyte, left hip: Secondary | ICD-10-CM | POA: Insufficient documentation

## 2022-09-08 DIAGNOSIS — E785 Hyperlipidemia, unspecified: Secondary | ICD-10-CM | POA: Diagnosis not present

## 2022-09-08 DIAGNOSIS — E890 Postprocedural hypothyroidism: Secondary | ICD-10-CM | POA: Insufficient documentation

## 2022-09-08 DIAGNOSIS — M109 Gout, unspecified: Secondary | ICD-10-CM | POA: Insufficient documentation

## 2022-09-08 DIAGNOSIS — I110 Hypertensive heart disease with heart failure: Secondary | ICD-10-CM | POA: Diagnosis not present

## 2022-09-08 DIAGNOSIS — G4730 Sleep apnea, unspecified: Secondary | ICD-10-CM | POA: Insufficient documentation

## 2022-09-08 DIAGNOSIS — I509 Heart failure, unspecified: Secondary | ICD-10-CM | POA: Insufficient documentation

## 2022-09-08 DIAGNOSIS — I428 Other cardiomyopathies: Secondary | ICD-10-CM | POA: Diagnosis not present

## 2022-09-08 HISTORY — PX: TOTAL HIP ARTHROPLASTY: SHX124

## 2022-09-08 LAB — GLUCOSE, CAPILLARY
Glucose-Capillary: 158 mg/dL — ABNORMAL HIGH (ref 70–99)
Glucose-Capillary: 140 mg/dL — ABNORMAL HIGH (ref 70–99)

## 2022-09-08 LAB — ABO/RH: ABO/RH(D): O NEG

## 2022-09-08 SURGERY — ARTHROPLASTY, HIP, TOTAL, ANTERIOR APPROACH
Anesthesia: Spinal | Site: Hip | Laterality: Left

## 2022-09-08 MED ORDER — TRANEXAMIC ACID-NACL 1000-0.7 MG/100ML-% IV SOLN
1000.0000 mg | Freq: Once | INTRAVENOUS | Status: AC
  Administered 2022-09-08: 1000 mg via INTRAVENOUS

## 2022-09-08 MED ORDER — KETAMINE HCL 10 MG/ML IJ SOLN
INTRAMUSCULAR | Status: DC | PRN
  Administered 2022-09-08 (×2): 10 mg via INTRAVENOUS

## 2022-09-08 MED ORDER — DEXAMETHASONE SODIUM PHOSPHATE 10 MG/ML IJ SOLN
INTRAMUSCULAR | Status: AC
  Filled 2022-09-08: qty 1

## 2022-09-08 MED ORDER — METHOCARBAMOL 500 MG PO TABS: 500.0000 mg | ORAL_TABLET | Freq: Four times a day (QID) | ORAL | Status: DC | PRN

## 2022-09-08 MED ORDER — GLYCOPYRROLATE 0.2 MG/ML IJ SOLN
INTRAMUSCULAR | Status: AC
  Filled 2022-09-08: qty 1

## 2022-09-08 MED ORDER — PROPOFOL 1000 MG/100ML IV EMUL
INTRAVENOUS | Status: AC
  Filled 2022-09-08: qty 100

## 2022-09-08 MED ORDER — OXYCODONE HCL 5 MG/5ML PO SOLN: 5.0000 mg | Freq: Once | ORAL | Status: DC | PRN

## 2022-09-08 MED ORDER — METHOCARBAMOL 500 MG PO TABS: 500.0000 mg | ORAL_TABLET | Freq: Four times a day (QID) | ORAL | 2 refills | Status: DC | PRN

## 2022-09-08 MED ORDER — CEFAZOLIN SODIUM-DEXTROSE 2-4 GM/100ML-% IV SOLN
2.0000 g | INTRAVENOUS | Status: AC
  Administered 2022-09-08: 2 g via INTRAVENOUS
  Filled 2022-09-08: qty 100

## 2022-09-08 MED ORDER — ONDANSETRON HCL 4 MG/2ML IJ SOLN: 4.0000 mg | Freq: Once | INTRAMUSCULAR | Status: DC | PRN

## 2022-09-08 MED ORDER — TRANEXAMIC ACID-NACL 1000-0.7 MG/100ML-% IV SOLN
1000.0000 mg | INTRAVENOUS | Status: AC
  Administered 2022-09-08: 1000 mg via INTRAVENOUS
  Filled 2022-09-08: qty 100

## 2022-09-08 MED ORDER — LACTATED RINGERS IV SOLN: INTRAVENOUS | Status: DC

## 2022-09-08 MED ORDER — OXYCODONE HCL 5 MG PO TABS
5.0000 mg | ORAL_TABLET | ORAL | Status: DC | PRN
  Administered 2022-09-08: 10 mg via ORAL

## 2022-09-08 MED ORDER — PHENYLEPHRINE HCL-NACL 20-0.9 MG/250ML-% IV SOLN
INTRAVENOUS | Status: AC
  Filled 2022-09-08: qty 750

## 2022-09-08 MED ORDER — EPHEDRINE 5 MG/ML INJ
INTRAVENOUS | Status: AC
  Filled 2022-09-08: qty 5

## 2022-09-08 MED ORDER — PHENYLEPHRINE HCL (PRESSORS) 10 MG/ML IV SOLN
INTRAVENOUS | Status: DC | PRN
  Administered 2022-09-08 (×6): 80 ug via INTRAVENOUS

## 2022-09-08 MED ORDER — SODIUM CHLORIDE (PF) 0.9 % IJ SOLN
INTRAMUSCULAR | Status: DC | PRN
  Administered 2022-09-08: 30 mL

## 2022-09-08 MED ORDER — FENTANYL CITRATE (PF) 100 MCG/2ML IJ SOLN
INTRAMUSCULAR | Status: AC
  Filled 2022-09-08: qty 2

## 2022-09-08 MED ORDER — CEFAZOLIN SODIUM-DEXTROSE 2-4 GM/100ML-% IV SOLN: 2.0000 g | Freq: Four times a day (QID) | INTRAVENOUS | Status: DC

## 2022-09-08 MED ORDER — ALBUMIN HUMAN 5 % IV SOLN: INTRAVENOUS | Status: DC | PRN

## 2022-09-08 MED ORDER — ONDANSETRON HCL 4 MG/2ML IJ SOLN
INTRAMUSCULAR | Status: DC | PRN
  Administered 2022-09-08: 4 mg via INTRAVENOUS

## 2022-09-08 MED ORDER — BUPIVACAINE IN DEXTROSE 0.75-8.25 % IT SOLN
INTRATHECAL | Status: DC | PRN
  Administered 2022-09-08: 2 mL via INTRATHECAL

## 2022-09-08 MED ORDER — OXYCODONE HCL 5 MG PO TABS: 5.0000 mg | ORAL_TABLET | Freq: Once | ORAL | Status: DC | PRN

## 2022-09-08 MED ORDER — 0.9 % SODIUM CHLORIDE (POUR BTL) OPTIME
TOPICAL | Status: DC | PRN
  Administered 2022-09-08: 1000 mL

## 2022-09-08 MED ORDER — CHLORHEXIDINE GLUCONATE 0.12 % MT SOLN
15.0000 mL | Freq: Once | OROMUCOSAL | Status: AC
  Administered 2022-09-08: 15 mL via OROMUCOSAL

## 2022-09-08 MED ORDER — LACTATED RINGERS IV BOLUS: 250.0000 mL | Freq: Once | INTRAVENOUS | Status: DC

## 2022-09-08 MED ORDER — KETOROLAC TROMETHAMINE 30 MG/ML IJ SOLN
INTRAMUSCULAR | Status: AC
  Filled 2022-09-08: qty 1

## 2022-09-08 MED ORDER — POLYETHYLENE GLYCOL 3350 17 G PO PACK: 17.0000 g | PACK | Freq: Two times a day (BID) | ORAL | 0 refills | Status: DC

## 2022-09-08 MED ORDER — EPHEDRINE SULFATE (PRESSORS) 50 MG/ML IJ SOLN
INTRAMUSCULAR | Status: DC | PRN
  Administered 2022-09-08 (×3): 10 mg via INTRAVENOUS

## 2022-09-08 MED ORDER — KETOROLAC TROMETHAMINE 30 MG/ML IJ SOLN
INTRAMUSCULAR | Status: DC | PRN
  Administered 2022-09-08: 30 mg via INTRAMUSCULAR

## 2022-09-08 MED ORDER — ACETAMINOPHEN 500 MG PO TABS: 1000.0000 mg | ORAL_TABLET | Freq: Four times a day (QID) | ORAL | Status: DC

## 2022-09-08 MED ORDER — METHOCARBAMOL 500 MG IVPB - SIMPLE MED
INTRAVENOUS | Status: AC
  Filled 2022-09-08: qty 55

## 2022-09-08 MED ORDER — DEXAMETHASONE SODIUM PHOSPHATE 10 MG/ML IJ SOLN
8.0000 mg | Freq: Once | INTRAMUSCULAR | Status: AC
  Administered 2022-09-08: 10 mg via INTRAVENOUS

## 2022-09-08 MED ORDER — METHOCARBAMOL 500 MG IVPB - SIMPLE MED
500.0000 mg | Freq: Four times a day (QID) | INTRAVENOUS | Status: DC | PRN
  Administered 2022-09-08: 500 mg via INTRAVENOUS

## 2022-09-08 MED ORDER — MIDAZOLAM HCL 5 MG/5ML IJ SOLN
INTRAMUSCULAR | Status: DC | PRN
  Administered 2022-09-08: 2 mg via INTRAVENOUS

## 2022-09-08 MED ORDER — SODIUM CHLORIDE (PF) 0.9 % IJ SOLN
INTRAMUSCULAR | Status: AC
  Filled 2022-09-08: qty 30

## 2022-09-08 MED ORDER — HYDROMORPHONE HCL 1 MG/ML IJ SOLN: 0.2500 mg | INTRAMUSCULAR | Status: DC | PRN

## 2022-09-08 MED ORDER — PROPOFOL 500 MG/50ML IV EMUL
INTRAVENOUS | Status: DC | PRN
  Administered 2022-09-08: 100 ug/kg/min via INTRAVENOUS
  Administered 2022-09-08: 30 mg via INTRAVENOUS

## 2022-09-08 MED ORDER — KETAMINE HCL 50 MG/5ML IJ SOSY
PREFILLED_SYRINGE | INTRAMUSCULAR | Status: AC
  Filled 2022-09-08: qty 5

## 2022-09-08 MED ORDER — ORAL CARE MOUTH RINSE: 15.0000 mL | Freq: Once | OROMUCOSAL | Status: AC

## 2022-09-08 MED ORDER — ASPIRIN 81 MG PO CHEW: 81.0000 mg | CHEWABLE_TABLET | Freq: Two times a day (BID) | ORAL | 0 refills | Status: AC

## 2022-09-08 MED ORDER — BUPIVACAINE-EPINEPHRINE (PF) 0.25% -1:200000 IJ SOLN
INTRAMUSCULAR | Status: DC | PRN
  Administered 2022-09-08: 30 mL

## 2022-09-08 MED ORDER — ONDANSETRON HCL 4 MG/2ML IJ SOLN
INTRAMUSCULAR | Status: AC
  Filled 2022-09-08: qty 2

## 2022-09-08 MED ORDER — TRANEXAMIC ACID-NACL 1000-0.7 MG/100ML-% IV SOLN
INTRAVENOUS | Status: AC
  Filled 2022-09-08: qty 100

## 2022-09-08 MED ORDER — SENNA 8.6 MG PO TABS: 1.0000 | ORAL_TABLET | Freq: Every day | ORAL | 0 refills | Status: AC

## 2022-09-08 MED ORDER — MIDAZOLAM HCL 2 MG/2ML IJ SOLN
INTRAMUSCULAR | Status: AC
  Filled 2022-09-08: qty 2

## 2022-09-08 MED ORDER — OXYCODONE HCL 5 MG PO TABS
ORAL_TABLET | ORAL | Status: AC
  Filled 2022-09-08: qty 2

## 2022-09-08 MED ORDER — POVIDONE-IODINE 10 % EX SWAB: 2.0000 | Freq: Once | CUTANEOUS | Status: DC

## 2022-09-08 MED ORDER — OXYCODONE HCL 5 MG PO TABS: 5.0000 mg | ORAL_TABLET | ORAL | 0 refills | Status: DC | PRN

## 2022-09-08 MED ORDER — PHENYLEPHRINE HCL-NACL 20-0.9 MG/250ML-% IV SOLN
INTRAVENOUS | Status: DC | PRN
  Administered 2022-09-08: 25 ug/min via INTRAVENOUS

## 2022-09-08 MED ORDER — LACTATED RINGERS IV SOLN: INTRAVENOUS | Status: DC | PRN

## 2022-09-08 MED ORDER — BUPIVACAINE-EPINEPHRINE (PF) 0.25% -1:200000 IJ SOLN
INTRAMUSCULAR | Status: AC
  Filled 2022-09-08: qty 30

## 2022-09-08 MED ORDER — FENTANYL CITRATE (PF) 100 MCG/2ML IJ SOLN
INTRAMUSCULAR | Status: DC | PRN
  Administered 2022-09-08 (×2): 50 ug via INTRAVENOUS

## 2022-09-08 SURGICAL SUPPLY — 42 items
ADH SKN CLS APL DERMABOND .7 (GAUZE/BANDAGES/DRESSINGS) ×1
BAG COUNTER SPONGE SURGICOUNT (BAG) IMPLANT
BAG DECANTER FOR FLEXI CONT (MISCELLANEOUS) IMPLANT
BAG SPEC THK2 15X12 ZIP CLS (MISCELLANEOUS)
BAG SPNG CNTER NS LX DISP (BAG)
BAG ZIPLOCK 12X15 (MISCELLANEOUS) IMPLANT
BLADE SAG 18X100X1.27 (BLADE) ×1 IMPLANT
COVER PERINEAL POST (MISCELLANEOUS) ×1 IMPLANT
COVER SURGICAL LIGHT HANDLE (MISCELLANEOUS) ×1 IMPLANT
CUP ACETBLR 52 OD PINNACLE (Hips) IMPLANT
DERMABOND ADVANCED .7 DNX12 (GAUZE/BANDAGES/DRESSINGS) ×1 IMPLANT
DRAPE FOOT SWITCH (DRAPES) ×1 IMPLANT
DRAPE STERI IOBAN 125X83 (DRAPES) ×1 IMPLANT
DRAPE U-SHAPE 47X51 STRL (DRAPES) ×2 IMPLANT
DRESSING AQUACEL AG SP 3.5X10 (GAUZE/BANDAGES/DRESSINGS) ×1 IMPLANT
DRSG AQUACEL AG ADV 3.5X10 (GAUZE/BANDAGES/DRESSINGS) IMPLANT
DRSG AQUACEL AG SP 3.5X10 (GAUZE/BANDAGES/DRESSINGS) ×1
DURAPREP 26ML APPLICATOR (WOUND CARE) ×1 IMPLANT
ELECT REM PT RETURN 15FT ADLT (MISCELLANEOUS) ×1 IMPLANT
FEM STEM 12/14 TAPER SZ 4 HIP (Orthopedic Implant) ×1 IMPLANT
FEMORAL STEM 12/14 TPR SZ4 HIP (Orthopedic Implant) IMPLANT
GLOVE BIO SURGEON STRL SZ 6 (GLOVE) ×1 IMPLANT
GLOVE BIOGEL PI IND STRL 6.5 (GLOVE) ×1 IMPLANT
GLOVE BIOGEL PI IND STRL 7.5 (GLOVE) ×1 IMPLANT
GLOVE ORTHO TXT STRL SZ7.5 (GLOVE) ×2 IMPLANT
GOWN STRL REUS W/ TWL LRG LVL3 (GOWN DISPOSABLE) ×2 IMPLANT
GOWN STRL REUS W/TWL LRG LVL3 (GOWN DISPOSABLE) ×2
HEAD CERAMIC DELTA 36 PLUS 1.5 (Hips) IMPLANT
HOLDER FOLEY CATH W/STRAP (MISCELLANEOUS) ×1 IMPLANT
KIT TURNOVER KIT A (KITS) IMPLANT
LINER NEUTRAL 52X36MM PLUS 4 (Liner) IMPLANT
PACK ANTERIOR HIP CUSTOM (KITS) ×1 IMPLANT
SCREW PINN CAN 6.5X20 (Screw) IMPLANT
SUT MNCRL AB 4-0 PS2 18 (SUTURE) ×1 IMPLANT
SUT STRATAFIX 0 PDS 27 VIOLET (SUTURE) ×1
SUT VIC AB 1 CT1 36 (SUTURE) ×3 IMPLANT
SUT VIC AB 2-0 CT1 27 (SUTURE) ×2
SUT VIC AB 2-0 CT1 TAPERPNT 27 (SUTURE) ×2 IMPLANT
SUTURE STRATFX 0 PDS 27 VIOLET (SUTURE) ×1 IMPLANT
TRAY FOLEY MTR SLVR 16FR STAT (SET/KITS/TRAYS/PACK) IMPLANT
TUBE SUCTION HIGH CAP CLEAR NV (SUCTIONS) ×1 IMPLANT
WATER STERILE IRR 1000ML POUR (IV SOLUTION) ×1 IMPLANT

## 2022-09-08 NOTE — Transfer of Care (Signed)
Immediate Anesthesia Transfer of Care Note  Patient: Tyler [name]  Procedure(s) Performed: TOTAL HIP ARTHROPLASTY ANTERIOR APPROACH (Left: Hip)  Patient Location: PACU  Anesthesia Type:MAC  Level of Consciousness: awake and alert   Airway & Oxygen Therapy: Patient Spontanous Breathing and Patient connected to nasal cannula oxygen  Post-op Assessment: Report given to RN and Post -op Vital signs reviewed and stable  Post vital signs: Reviewed and stable  Last Vitals:  Vitals Value Taken Time  BP 97/65 09/08/22 1036  Temp    Pulse 95 09/08/22 1039  Resp 16 09/08/22 1039  SpO2 97 % 09/08/22 1039  Vitals shown include unvalidated device data.  Last Pain:  Vitals:   09/08/22 0630  TempSrc: Oral         Complications: No notable events documented.

## 2022-09-08 NOTE — Evaluation (Addendum)
Physical Therapy Evaluation Patient Details Name: Tyler [name] MRN: [id number] DOB: 11/22/1975 Today's Date: 09/08/2022  History of Present Illness  47 yo male presents to therapy s/p LTHA, anterior approach on 09/08/2022 due to failure of conservative measures. Pt PMH includes but is not limited to: CAD, OSA, cardiomyopathy, DM II, gout, HTN, tobacco abuse, CHF, malignant neoplasm of thyroid s/p thyroidectomy (2024) and R THA (2023).  Clinical Impression    Tyler [name] is a 47 y.o. male POD 0 s/p L THA. Patient reports IND with mobility at baseline. Patient is now limited by functional impairments (see PT problem list below) and requires min guard and cues for transfers and gait with RW. Patient was able to ambulate 20 and 25 feet with RW and min guard and cues for safe walker management. Patient educated on safe sequencing for stair mobility, car transfers, pain management, use of CP pt and significant other verbalized understanding of safe guarding position for people assisting with mobility. Patient instructed in exercises to facilitate ROM and circulation reviewed and HO provided. Patient has met mobility goals at adequate level for discharge home; will continue to follow if pt continues acute stay to progress towards Mod I goals.      Recommendations for follow up therapy are one component of a multi-disciplinary discharge planning process, led by the attending physician.  Recommendations may be updated based on patient status, additional functional criteria and insurance authorization.  Follow Up Recommendations No PT follow up      Assistance Recommended at Discharge Intermittent Supervision/Assistance  Patient can return home with the following  A little help with walking and/or transfers;A little help with bathing/dressing/bathroom;Assistance with cooking/housework;Assist for transportation;Help with stairs or ramp for entrance    Equipment Recommendations None recommended by PT  (pt reports having DME)  Recommendations for Other Services       Functional Status Assessment Patient has had a recent decline in their functional status and demonstrates the ability to make significant improvements in function in a reasonable and predictable amount of time.     Precautions / Restrictions Precautions Precautions: Fall Restrictions Weight Bearing Restrictions: No      Mobility  Bed Mobility Overal bed mobility: Needs Assistance Bed Mobility: Supine to Sit     Supine to sit: Supervision          Transfers Overall transfer level: Needs assistance Equipment used: Rolling walker (2 wheels) Transfers: Sit to/from Stand Sit to Stand: Min guard           General transfer comment: cues for slow and controlled movements, UE placement    Ambulation/Gait Ambulation/Gait assistance: Min guard Gait Distance (Feet): 25 Feet Assistive device: Rolling walker (2 wheels) Gait Pattern/deviations: Step-to pattern (lateral sway) Gait velocity: decreased        Stairs Stairs: Yes Stairs assistance: Min guard Stair Management: Two rails Number of Stairs: 2 General stair comments: cues for safety and sequencing  Wheelchair Mobility    Modified Rankin (Stroke Patients Only)       Balance Overall balance assessment: Needs assistance Sitting-balance support: Feet supported Sitting balance-Leahy Scale: Fair     Standing balance support: No upper extremity supported (static standing and B UE support at [facility] for dynamic activity) Standing balance-Leahy Scale: Fair                               Pertinent Vitals/Pain Pain Assessment Pain Assessment: 0-10 Pain Score: 2  Pain Location: L hip Pain Descriptors / Indicators: Aching Pain Intervention(s): Monitored during session, Limited activity within patient's tolerance, Premedicated before session    Home Living Family/patient expects to be discharged to:: Private residence Living  Arrangements: Spouse/significant other Available Help at Discharge: Family Type of Home: House Home Access: Stairs to enter Entrance Stairs-Rails: None Entrance Stairs-Number of Steps: 1   Home Layout: Two level;Able to live on main level with bedroom/bathroom Home Equipment: Rolling Walker (2 wheels);Crutches;Wheelchair - manual      Prior Function Prior Level of Function : Independent/Modified Independent             Mobility Comments: IND with all ADLs, self care tasks, IADLs, driving       Hand Dominance        Extremity/Trunk Assessment        Lower Extremity Assessment Lower Extremity Assessment: LLE deficits/detail LLE Deficits / Details: L ankle DF.PF 5/5 LLE Sensation: WNL       Communication   Communication: No difficulties  Cognition Arousal/Alertness: Awake/alert Behavior During Therapy: WFL for tasks assessed/performed Overall Cognitive Status: Within Functional Limits for tasks assessed                                          General Comments      Exercises Total Joint Exercises Ankle Circles/Pumps: AROM, Both, 20 reps Quad Sets: AROM, Left, 10 reps Heel Slides: AROM, Left, 10 reps Hip ABduction/ADduction: AROM, Left, 10 reps, Supine, Standing Long Arc Quad: AROM, Left, 10 reps Knee Flexion: Standing, AROM, Left, 10 reps Marching in Standing: AROM, Both, 10 reps Standing Hip Extension: AROM, Left, 10 reps   Assessment/Plan    PT Assessment    PT Problem List         PT Treatment Interventions      PT Goals (Current goals can be found in the Care Plan section)  Acute Rehab PT Goals Patient Stated Goal: to be able to walk up steps with a more normal pattern PT Goal Formulation: With patient Time For Goal Achievement: 09/22/22 Potential to Achieve Goals: Good    Frequency       Co-evaluation               AM-PAC PT "6 Clicks" Mobility  Outcome Measure Help needed turning from your back to your side  while in a flat bed without using bedrails?: None Help needed moving from lying on your back to sitting on the side of a flat bed without using bedrails?: A Little Help needed moving to and from a bed to a chair (including a wheelchair)?: A Little Help needed standing up from a chair using your arms (e.g., wheelchair or bedside chair)?: A Little Help needed to walk in hospital room?: A Little Help needed climbing 3-5 steps with a railing? : A Little 6 Click Score: 19    End of Session Equipment Utilized During Treatment: Gait belt Activity Tolerance: Patient tolerated treatment well Patient left: in chair;with call bell/phone within reach;with family/visitor present Nurse Communication: Mobility status;Other (comment) (progression toward d/c) PT Visit Diagnosis: Unsteadiness on feet (R26.81);Muscle weakness (generalized) (M62.81);Difficulty in walking, not elsewhere classified (R26.2)    Time: [id number] PT Time Calculation (min) (ACUTE ONLY): 31 min   Charges:   PT Evaluation $PT Eval Low Complexity: 1 Low PT Treatments $Gait Training: 8-22 mins  Baird Lyons, PT   Adair Patter 09/08/2022, 4:19 PM

## 2022-09-08 NOTE — Op Note (Signed)
NAME:  Tyler [name]                ACCOUNT NO.: [id number]      MEDICAL RECORD NO.: [id number]      FACILITY:  [facility]      PHYSICIAN:  Mauri Pole  DATE OF BIRTH:  June 01, 1976     DATE OF PROCEDURE:  09/08/2022                                 OPERATIVE REPORT         PREOPERATIVE DIAGNOSIS: Left  hip avascular necrosis.      POSTOPERATIVE DIAGNOSIS:  Left hip avascular necrosis      PROCEDURE:  Left total hip replacement through an anterior approach   utilizing DePuy THR system, component size 52 mm pinnacle cup, a size 36+4 neutral   Altrex liner, a size 4Hi Actis stem with a 36+1.5 delta ceramic   ball.      SURGEON:  Pietro Cassis. Alvan Dame, M.D.      ASSISTANT:  Costella Hatcher, PA-C     ANESTHESIA:  Spinal.      SPECIMENS:  None.      COMPLICATIONS:  None.      BLOOD LOSS:  300 cc     DRAINS:  None.      INDICATION OF THE PROCEDURE:  [name] is a 47 y.o. male who had   presented to office for evaluation of left hip pain.  Radiographs and MRI revealed avascular necrosis with early subchondral collapse  progressive degenerative changes with bone-on-bone   articulation of the  hip joint, including subchondral cystic changes and osteophytes.  The patient had painful limited range of   motion significantly affecting their overall quality of life and function.  The patient was failing to    respond to conservative measures including medications and/or injections and activity modification and at this point was ready   to proceed with more definitive measures.  Consent was obtained for   benefit of pain relief.  Specific risks of infection, DVT, component   failure, dislocation, neurovascular injury, and need for revision surgery were reviewed in the office.     PROCEDURE IN DETAIL:  The patient was brought to operative theater.   Once adequate anesthesia, preoperative antibiotics, 2 gm of Ancef, 1 gm of Tranexamic Acid, and 10 mg of Decadron were  administered, the patient was positioned supine on the [employer] table.  Once the patient was safely positioned with adequate padding of boney prominences we predraped out the hip, and used fluoroscopy to confirm orientation of the pelvis.      The left hip was then prepped and draped from proximal iliac crest to   mid thigh with a shower curtain technique.      Time-out was performed identifying the patient, planned procedure, and the appropriate extremity.     An incision was then made 2 cm lateral to the   anterior superior iliac spine extending over the orientation of the   tensor fascia lata muscle and sharp dissection was carried down to the   fascia of the muscle.      The fascia was then incised.  The muscle belly was identified and swept   laterally and retractor placed along the superior neck.  Following   cauterization of the circumflex vessels and removing some pericapsular   fat, a  second cobra retractor was placed on the inferior neck.  A T-capsulotomy was made along the line of the   superior neck to the trochanteric fossa, then extended proximally and   distally.  Tag sutures were placed and the retractors were then placed   intracapsular.  We then identified the trochanteric fossa and   orientation of my neck cut and then made a neck osteotomy with the femur on traction.  The femoral   head was removed without difficulty or complication.  Traction was let   off and retractors were placed posterior and anterior around the   acetabulum.      The labrum and foveal tissue were debrided.  I began reaming with a 47 mm   reamer and reamed up to 51 mm reamer with good bony bed preparation and a 52 mm  cup was chosen.  The final 52 mm Pinnacle cup was then impacted under fluoroscopy to confirm the depth of penetration and orientation with respect to   Abduction and forward flexion.  A screw was placed into the ilium followed by the hole eliminator.  The final   36+4 neutral Altrex  liner was impacted with good visualized rim fit.  The cup was positioned anatomically within the acetabular portion of the pelvis.      At this point, the femur was rolled to 100 degrees.  Further capsule was   released off the inferior aspect of the femoral neck.  I then   released the superior capsule proximally.  With the leg in a neutral position the hook was placed laterally   along the femur under the vastus lateralis origin and elevated manually and then held in position using the hook attachment on the bed.  The leg was then extended and adducted with the leg rolled to 100   degrees of external rotation.  Retractors were placed along the medial calcar and posteriorly over the greater trochanter.  Once the proximal femur was fully   exposed, I used a box osteotome to set orientation.  I then began   broaching with the starting chili pepper broach and passed this by hand and then broached up to 4.  With the 4 broach in place I chose a high offset neck and did several trial reductions.  The offset was appropriate, leg lengths   appeared to be equal best matched with the +1.5 head ball trial confirmed radiographically.   Given these findings, I went ahead and dislocated the hip, repositioned all   retractors and positioned the right hip in the extended and abducted position.  The final 4 Hi Actis stem was   chosen and it was impacted down to the level of neck cut.  Based on this   and the trial reductions, a final 36+1.5 delta ceramic ball was chosen and   impacted onto a clean and dry trunnion, and the hip was reduced.  The   hip had been irrigated throughout the case again at this point.  I did   reapproximate the superior capsular leaflet to the anterior leaflet   using #1 Vicryl.  The fascia of the   tensor fascia lata muscle was then reapproximated using #1 Vicryl and #0 Stratafix sutures.  The   remaining wound was closed with 2-0 Vicryl and running 4-0 Monocryl.   The hip was cleaned,  dried, and dressed sterilely using Dermabond and   Aquacel dressing.  The patient was then brought   to recovery room in stable condition  tolerating the procedure well.    Costella Hatcher, PA-C was present for the entirety of the case involved from   preoperative positioning, perioperative retractor management, general   facilitation of the case, as well as primary wound closure as assistant.            Pietro Cassis Alvan Dame, M.D.        09/08/2022 9:02 AM

## 2022-09-08 NOTE — Discharge Instructions (Signed)

## 2022-09-08 NOTE — Anesthesia Procedure Notes (Signed)
Spinal  Patient location during procedure: OR Start time: 09/08/2022 8:54 AM End time: 09/08/2022 8:57 AM Reason for block: surgical anesthesia Staffing Performed: anesthesiologist  Anesthesiologist: Josephine Igo, MD Performed by: Josephine Igo, MD Authorized by: Josephine Igo, MD   Preanesthetic Checklist Completed: patient identified, IV checked, site marked, risks and benefits discussed, surgical consent, monitors and equipment checked, pre-op evaluation and timeout performed Spinal Block Patient position: sitting Prep: DuraPrep and site prepped and draped Patient monitoring: heart rate, cardiac monitor, continuous pulse ox and blood pressure Approach: midline Location: L3-4 Injection technique: single-shot Needle Needle type: Pencan  Needle gauge: 24 G Needle length: 9 cm Needle insertion depth: 7 cm Assessment Sensory level: T4 Events: CSF return Additional Notes Patient tolerated procedure well. Adequate sensory level.

## 2022-09-08 NOTE — Anesthesia Postprocedure Evaluation (Signed)
Anesthesia Post Note  Patient: Tyler [name]  Procedure(s) Performed: TOTAL HIP ARTHROPLASTY ANTERIOR APPROACH (Left: Hip)     Patient location during evaluation: PACU Anesthesia Type: Spinal Level of consciousness: oriented and awake and alert Pain management: pain level controlled Vital Signs Assessment: post-procedure vital signs reviewed and stable Respiratory status: spontaneous breathing, respiratory function stable and nonlabored ventilation Cardiovascular status: blood pressure returned to baseline and stable Postop Assessment: no headache, no backache, no apparent nausea or vomiting, patient able to bend at knees and spinal receding Anesthetic complications: no   No notable events documented.  Last Vitals:  Vitals:   09/08/22 1145 09/08/22 1200  BP: 115/84 115/82  Pulse: 80   Resp: 13   Temp:    SpO2: 92%     Last Pain:  Vitals:   09/08/22 1200  TempSrc:   PainSc: 0-No pain    LLE Motor Response: Purposeful movement (09/08/22 1200) LLE Sensation: Decreased;Numbness (09/08/22 1200) RLE Motor Response: Purposeful movement (09/08/22 1200) RLE Sensation: Decreased;Numbness (09/08/22 1200) L Sensory Level: L3-Anterior knee, lower leg (09/08/22 1200) R Sensory Level: L3-Anterior knee, lower leg (09/08/22 1200)  Edrei Norgaard A.

## 2022-09-08 NOTE — Interval H&P Note (Signed)
History and Physical Interval Note:  09/08/2022 6:50 AM  Tyler [name]  has presented today for surgery, with the diagnosis of Left hip avascular necrosis.  The various methods of treatment have been discussed with the patient and family. After consideration of risks, benefits and other options for treatment, the patient has consented to  Procedure(s): TOTAL HIP ARTHROPLASTY ANTERIOR APPROACH (Left) as a surgical intervention.  The patient's history has been reviewed, patient examined, no change in status, stable for surgery.  I have reviewed the patient's chart and labs.  Questions were answered to the patient's satisfaction.     Mauri Pole

## 2022-09-09 ENCOUNTER — Encounter (HOSPITAL_COMMUNITY): Payer: Self-pay | Admitting: Orthopedic Surgery

## 2022-09-25 ENCOUNTER — Ambulatory Visit: Payer: BC Managed Care – PPO | Admitting: Nurse Practitioner

## 2022-09-25 ENCOUNTER — Ambulatory Visit: Payer: BC Managed Care – PPO | Admitting: Family Medicine

## 2022-09-25 DIAGNOSIS — E119 Type 2 diabetes mellitus without complications: Secondary | ICD-10-CM | POA: Diagnosis not present

## 2022-09-25 DIAGNOSIS — E890 Postprocedural hypothyroidism: Secondary | ICD-10-CM | POA: Diagnosis not present

## 2022-09-25 DIAGNOSIS — C73 Malignant neoplasm of thyroid gland: Secondary | ICD-10-CM | POA: Diagnosis not present

## 2022-10-02 ENCOUNTER — Encounter: Payer: Self-pay | Admitting: "Endocrinology

## 2022-10-02 ENCOUNTER — Ambulatory Visit (INDEPENDENT_AMBULATORY_CARE_PROVIDER_SITE_OTHER): Payer: BC Managed Care – PPO | Admitting: "Endocrinology

## 2022-10-02 VITALS — BP 148/110 | HR 76 | Ht 70.0 in | Wt 274.0 lb

## 2022-10-02 DIAGNOSIS — E119 Type 2 diabetes mellitus without complications: Secondary | ICD-10-CM | POA: Diagnosis not present

## 2022-10-02 DIAGNOSIS — C73 Malignant neoplasm of thyroid gland: Secondary | ICD-10-CM | POA: Diagnosis not present

## 2022-10-02 DIAGNOSIS — E890 Postprocedural hypothyroidism: Secondary | ICD-10-CM

## 2022-10-02 DIAGNOSIS — E559 Vitamin D deficiency, unspecified: Secondary | ICD-10-CM

## 2022-10-02 DIAGNOSIS — I10 Essential (primary) hypertension: Secondary | ICD-10-CM

## 2022-10-02 MED ORDER — LEVOTHYROXINE SODIUM 25 MCG PO TABS: 25.0000 ug | ORAL_TABLET | Freq: Every day | ORAL | 1 refills | Status: DC

## 2022-10-02 NOTE — Patient Instructions (Signed)

## 2022-10-02 NOTE — Progress Notes (Signed)
10/02/2022     Endocrinology follow-up note   Subjective:    Patient ID: Tyler [name], male    DOB: [birth year], PCP Reginia Forts Doralee Albino, FNP   Past Medical History:  Diagnosis Date   Arthritis    Cardiomyopathy    CHF (congestive heart failure)    Complication of anesthesia    Woke up during surgery   Coronary artery disease    Diabetes    Gout    High cholesterol    Hypertension    Low testosterone 04/17/2021   Malignant neoplasm of thyroid gland 04/17/2016   Pneumonia    Postsurgical hypothyroidism    thyroid cancer   Sleep apnea    Vitamin D insufficiency    Past Surgical History:  Procedure Laterality Date   EYE SURGERY Left    cataract removal   FRACTURE SURGERY Left 06/23/1987   ankle   THYROIDECTOMY N/A 03/03/2013   Procedure: TOTAL THYROIDECTOMY;  Surgeon: Dalia Heading, MD;  Location: [facility];  Service: General;  Laterality: N/A;   TOTAL HIP ARTHROPLASTY Right 07/28/2021   TOTAL HIP ARTHROPLASTY Left 09/08/2022   Procedure: TOTAL HIP ARTHROPLASTY ANTERIOR APPROACH;  Surgeon: Durene Romans, MD;  Location: [facility];  Service: Orthopedics;  Laterality: Left;   Social History   Socioeconomic History   Marital status: Single    Spouse name: Not on file   Number of children: Not on file   Years of education: Not on file   Highest education level: Not on file  Occupational History   Not on file  Tobacco Use   Smoking status: Every Day    Packs/day: 0.50    Years: 10.00    Additional pack years: 0.00    Total pack years: 5.00    Types: Cigarettes   Smokeless tobacco: Former  Building services engineer Use: Never used  Substance and Sexual Activity   Alcohol use: Yes    Comment: occasional   Drug use: No   Sexual activity: Yes    Birth control/protection: None  Other Topics Concern   Not on file  Social History Narrative   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 Strain: Low Risk  (01/20/2022)   Overall Financial Resource Strain  (CARDIA)    Difficulty of Paying Living Expenses: Not hard at all  Food Insecurity: No Food Insecurity (03/10/2022)   Hunger Vital Sign    Worried About Running Out of Food in the Last Year: Never true    Ran Out of Food in the Last Year: Never true  Transportation Needs: No Transportation Needs (03/10/2022)   PRAPARE - Administrator, Civil Service (Medical): No    Lack of Transportation (Non-Medical): No  Physical Activity: Not on file  Stress: Not on file  Social Connections: Not on file   Outpatient Encounter Medications as of 10/02/2022  Medication Sig   levothyroxine (SYNTHROID) 25 MCG tablet Take 1 tablet (25 mcg total) by mouth daily before breakfast.   acetaminophen (TYLENOL) 500 MG tablet Take 1,000 mg by mouth every 6 (six) hours as needed for moderate pain.   aspirin (ASPIRIN CHILDRENS) 81 MG chewable tablet Chew 1 tablet (81 mg total) by mouth in the morning and at bedtime for 28 days.   carvedilol (COREG) 3.125 MG tablet Take 3.125 mg by mouth 2 (two) times daily with a meal. Hold if sbp is less than 105 or hr less than 60   colchicine 0.6  MG tablet TAKE 2 TABLETS AT ONSET, THEN 1 TABLET 2 HOURS LATER- A MAXIMUM OF 3 TABLETS IN 24 HOURS (Patient taking differently: TAKE 2 TABLETS AT ONSET, THEN 1 TABLET 2 HOURS LATER AS NEEDED FOR GOUT FLARES - A MAXIMUM OF 3 TABLETS IN 24 HOURS)   ENTRESTO 97-103 MG Take 1 tablet by mouth 2 (two) times daily.   levothyroxine (SYNTHROID) 200 MCG tablet Take 200 mcg by mouth daily before breakfast.   magnesium oxide (MAG-OX) 400 MG tablet Take 400 mg by mouth daily.   metFORMIN (GLUCOPHAGE) 1000 MG tablet TAKE 1 TABLET (1,000 MG TOTAL) BY MOUTH TWICE A DAY WITH FOOD   methocarbamol (ROBAXIN) 500 MG tablet Take 1 tablet (500 mg total) by mouth every 6 (six) hours as needed for muscle spasms.   metoprolol succinate (TOPROL-XL) 100 MG 24 hr tablet Take 1 tablet (100 mg total) by mouth daily after breakfast.   mupirocin ointment  (BACTROBAN) 2 % Apply 1 Application topically 2 (two) times daily as needed (irritation/infection).   nystatin (MYCOSTATIN/NYSTOP) powder Apply 1 Application topically 2 (two) times daily as needed (irritation).   nystatin cream (MYCOSTATIN) Apply 1 Application topically 2 (two) times daily as needed (irritation).   omega-3 acid ethyl esters (LOVAZA) 1 g capsule TAKE 2 CAPSULES BY MOUTH 2 TIMES DAILY.   oxyCODONE (ROXICODONE) 5 MG immediate release tablet Take 1 tablet (5 mg total) by mouth every 4 (four) hours as needed for severe pain.   polyethylene glycol (MIRALAX / GLYCOLAX) 17 g packet Take 17 g by mouth 2 (two) times daily.   pravastatin (PRAVACHOL) 40 MG tablet TAKE 1 TABLET BY MOUTH EVERY DAY (Patient taking differently: Take 40 mg by mouth at bedtime.)   TURMERIC CURCUMIN PO Take 1,000 mg by mouth 2 (two) times daily.   valACYclovir (VALTREX) 1000 MG tablet Take 1,000-2,000 mg by mouth See admin instructions. Take 2000 mg at onset of fever blisters, then take 1000 mg daily as needed   Vitamin D, Ergocalciferol, (DRISDOL) 1.25 MG (50000 UNIT) CAPS capsule Take 50,000 Units by mouth every Sunday.   No facility-administered encounter medications on file as of 10/02/2022.   ALLERGIES: Allergies  Allergen Reactions   Isosorbide Other (See Comments)    Low blood pressure   VACCINATION STATUS: Immunization History  Administered Date(s) Administered   Influenza-Unspecified 05/20/2000   Janssen (J&J) SARS-COV-2 Vaccination 04/03/2020   Td 04/08/1999   Tdap 05/05/2020    HPI  47 yr old male with medical history as above . He underwent total thyroidectomy for thyroid cancer on 03/03/2013 by Dr. Lovell Sheehan at [facility] in [place].   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 no lymph nodes were identified.  He received his first Thyrogen stimulated I -131 thyroid remnant ablation on 05/05/13  same, with no evidence of distant metastasis. -Subsequent Thyrogen Stimulated  whole body scan in January 2017  was reported to be unremarkable for tumor recurrence/distant metastases.    -On March 17, 2021, he underwent Thyrogen stimulated whole-body scan which was unremarkable. He presents with repeat thyroid function test consistent with under replacement.  He is currently on levothyroxine 200 mcg p.o. daily before breakfast.    - He also has hypertension, type 2 diabetes, currently on metformin.  His previsit labs show A1c of 7.1%, improving from 8%.  He remains on metformin 1000 mg p.o. twice daily.    His blood pressure is not controlled  despite multiple blood pressure medications with duplicate doses. -He is recovering from recent hip surgery.  Ambulates with a pair of crutches.  Review of Systems  Review of systems  Constitutional: + Minimally fluctuating body weight,  current  Body mass index is 39.31 kg/m. , no fatigue, no subjective hyperthermia, no subjective hypothermia    Objective:    BP (!) 148/110 Comment: R arm with manuel cuff  Pulse 76   Ht 5\' 10"  (1.778 m)   Wt 274 lb (124.3 kg)   BMI 39.31 kg/m   Wt Readings from Last 3 Encounters:  10/02/22 274 lb (124.3 kg)  09/08/22 279 lb 12.8 oz (126.9 kg)  08/28/22 279 lb 12.8 oz (126.9 kg)      Recent Results (from the past 2160 hour(s))  Surgical pcr screen     Status: None   Collection Time: 08/28/22  1:52 PM   Specimen: Nasal Mucosa; Nasal Swab  Result Value Ref Range   MRSA, PCR NEGATIVE NEGATIVE   Staphylococcus aureus NEGATIVE NEGATIVE    Comment: (NOTE) The Xpert SA Assay (FDA approved for NASAL specimens in patients 36 years of age and older), is one component of a comprehensive surveillance program. It is not intended to diagnose infection nor to guide or monitor treatment. Performed at [facility], [address], [place], [place] [address]   Hemoglobin A1c per protocol      Status: Abnormal   Collection Time: 08/28/22  1:52 PM  Result Value Ref Range   Hgb A1c MFr Bld 7.1 (H) 4.8 - 5.6 %    Comment: (NOTE)         Prediabetes: 5.7 - 6.4         Diabetes: >6.4         Glycemic control for adults with diabetes: <7.0    Mean Plasma Glucose 157 mg/dL    Comment: (NOTE) Performed At: [facility] [address] [place], [place] [id number] Jolene Schimke MD BO:[id number]   Basic metabolic panel per protocol     Status: Abnormal   Collection Time: 08/28/22  1:52 PM  Result Value Ref Range   Sodium 140 135 - 145 mmol/L   Potassium 3.7 3.5 - 5.1 mmol/L   Chloride 108 98 - 111 mmol/L   CO2 21 (L) 22 - 32 mmol/L   Glucose, Bld 193 (H) 70 - 99 mg/dL    Comment: Glucose reference range applies only to samples taken after fasting for at least 8 hours.   BUN 10 6 - 20 mg/dL   Creatinine, Ser 7.78 0.61 - 1.24 mg/dL   Calcium 9.2 8.9 - 24.2 mg/dL   GFR, Estimated >35 >36 mL/min    Comment: (NOTE) Calculated using the CKD-EPI Creatinine Equation (2021)    Anion gap 11 5 - 15    Comment: Performed at [facility], [address], [place], [place] [address]  CBC per protocol     Status: None   Collection Time: 08/28/22  1:52 PM  Result Value Ref Range   WBC 9.0 4.0 - 10.5 K/uL   RBC 4.62 4.22 - 5.81 MIL/uL   Hemoglobin 13.8 13.0 - 17.0 g/dL   HCT 54.0 08.6 - 76.1 %   MCV 91.6 80.0 - 100.0 fL   MCH 29.9 26.0 - 34.0 pg   MCHC 32.6 30.0 - 36.0 g/dL   RDW 95.0 93.2 - 67.1 %   Platelets 270 150 - 400 K/uL   nRBC 0.0 0.0 -  0.2 %    Comment: Performed at [facility], [address], [place], [place] [address]  Glucose, capillary     Status: Abnormal   Collection Time: 08/28/22  2:07 PM  Result Value Ref Range   Glucose-Capillary 187 (H) 70 - 99 mg/dL    Comment: Glucose reference range applies only to samples taken after fasting for at least 8 hours.  Type and screen Order type and screen if day of surgery is  less than 15 days from draw of preadmission visit or order morning of surgery if day of surgery is greater than 6 days from preadmission visit.     Status: None   Collection Time: 08/28/22  2:30 PM  Result Value Ref Range   ABO/RH(D) O NEG    Antibody Screen NEG    Sample Expiration 09/11/2022,2359    Extend sample reason      NO TRANSFUSIONS OR PREGNANCY IN THE PAST 3 MONTHS Performed at [facility], [address] [place], [place] [address]   Glucose, capillary     Status: Abnormal   Collection Time: 09/08/22  6:26 AM  Result Value Ref Range   Glucose-Capillary 158 (H) 70 - 99 mg/dL    Comment: Glucose reference range applies only to samples taken after fasting for at least 8 hours.  ABO/Rh     Status: None   Collection Time: 09/08/22  7:04 AM  Result Value Ref Range   ABO/RH(D)      [place] NEG Performed at [facility], [address], [place], [place] [address]   Glucose, capillary     Status: Abnormal   Collection Time: 09/08/22 10:42 AM  Result Value Ref Range   Glucose-Capillary 140 (H) 70 - 99 mg/dL    Comment: Glucose reference range applies only to samples taken after fasting for at least 8 hours.  Comprehensive metabolic panel     Status: Abnormal   Collection Time: 09/25/22 12:14 PM  Result Value Ref Range   Glucose 139 (H) 70 - 99 mg/dL   BUN 11 6 - 24 mg/dL   Creatinine, Ser 9.14 0.76 - 1.27 mg/dL   eGFR 782 >95 AO/ZHY/8.65   BUN/Creatinine Ratio 14 9 - 20   Sodium 142 134 - 144 mmol/L   Potassium 4.7 3.5 - 5.2 mmol/L   Chloride 105 96 - 106 mmol/L   CO2 21 20 - 29 mmol/L   Calcium 9.9 8.7 - 10.2 mg/dL   Total Protein 7.1 6.0 - 8.5 g/dL   Albumin 4.2 4.1 - 5.1 g/dL   Globulin, Total 2.9 1.5 - 4.5 g/dL   Albumin/Globulin Ratio 1.4 1.2 - 2.2   Bilirubin Total <0.2 0.0 - 1.2 mg/dL   Alkaline Phosphatase 132 (H) 44 - 121 IU/L   AST 18 0 - 40 IU/L   ALT 21 0 - 44 IU/L  TSH     Status: Abnormal   Collection Time: 09/25/22  12:14 PM  Result Value Ref Range   TSH 9.610 (H) 0.450 - 4.500 uIU/mL  T4, free     Status: None   Collection Time: 09/25/22 12:14 PM  Result Value Ref Range   Free T4 1.63 0.82 - 1.77 ng/dL  Thyroglobulin Level     Status: None (Preliminary result)   Collection Time: 09/25/22 12:14 PM  Result Value Ref Range   Thyroglobulin (TG-RIA) WILL FOLLOW    Diabetic Labs (most recent): Lab Results  Component Value Date   HGBA1C 7.1 (  H) 08/28/2022   HGBA1C 8.4 (A) 06/30/2022   HGBA1C 7.3 (H) 03/27/2022   MICROALBUR 29.5 02/12/2020    Lipid Panel     Component Value Date/Time   CHOL 194 04/23/2022 0821   TRIG 148 04/23/2022 0821   HDL 35 (L) 04/23/2022 0821   CHOLHDL 5.5 (H) 04/23/2022 0821   LDLCALC 132 (H) 04/23/2022 0821   LABVLDL 27 04/23/2022 0821    Assessment & Plan:   1. Postsurgical hypothyroidism  His recent thyroid function tests were consistent with under replacement.  He would benefit from additional dose of levothyroxine.  I discussed and added levothyroxine 25 mcg to his existing 200 mcg to make a total daily dose of 225 mcg p.o. daily before breakfast.    - We discussed about the correct intake of his thyroid hormone, on empty stomach at fasting, with water, separated by at least 30 minutes from breakfast and other medications,  and separated by more than 4 hours from calcium, iron, multivitamins, acid reflux medications (PPIs). -Patient is made aware of the fact that thyroid hormone replacement is needed for life, dose to be adjusted by periodic monitoring of thyroid function tests.  - TSH target for thyroid cancer patients is between 0.1-0.5.  2. 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 right lobe : follicular adenoma with foci of papillary microcarcinoma , follicular  variant. - After his surgery, he received  of I-131 remnant ablation on 05/05/13,  post therapy whole body scar showing no evidence of distant metastasis.  - surveillance ultrasound of the neck/thyroid in February 2016 was negative for thyroid remnants.  - whole-body scan with Thyrogen was negative for tumor recurrence on 07/05/2015.  -Another surveillance  thyroid/neck ultrasound on 06/09/2016 is remarkable for surgically absent thyroid.  - on October 04, 2017 he underwent another surveillance Thyrogen stimulated  whole-body scan with negative findings for tumor recurrence or distant metastasis. -His previous labs show thyroglobulin level undetectable (< 2) -Previsit surveillance thyroid/neck ultrasound negative for  residual thyroid tissue or recurrence or lymphadenopathy.  -September 2022 Thyrogen stimulated whole-body scan is unremarkable and favorable for tumor remission.  He will be considered for thyroid/neck ultrasound next year.  2.  Type 2 diabetes: Recent diagnosis, worsening. -His previsit labs show A1c of 7.1%, improving from 8.4%.  He is advised to continue metformin 1000 mg p.o. twice daily.    I had a long discussion with this patient on potential complications of uncontrolled diabetes.  His diabetes so far is complicated by obesity/sedentary life and patient remains at exceedingly high risk for complications including    CAD, CVA, CKD, retinopathy, and neuropathy. These are all discussed in detail with him.  - I have counseled him on diet management and weight loss, by adopting a carbohydrate restricted/protein rich diet.  - he acknowledges that there is a room for improvement in his food and drink choices. - Suggestion is made for him to avoid simple carbohydrates  from his diet including Cakes, Sweet Desserts, Ice Cream, Soda (diet and regular), Sweet Tea, Candies, Chips, Cookies, Store Bought Juices, Alcohol in Excess of  1-2 drinks a day, Artificial Sweeteners,  Coffee  Creamer, and "Sugar-free" Products, Lemonade. This will help patient to have more stable blood glucose profile and potentially avoid unintended weight gain.  - I encouraged him to switch  to  unprocessed or minimally processed complex starch and increased protein intake (animal or plant source), fruits, and vegetables.  -In light of his history of thyroid malignancy, he is not an optimal candidate for GLP-1 receptor agonists nor DPP 4 inhibitors.    3) hypertension-his blood pressure is uncontrolled despite multiple medications.  He has questionable compliance with medications, salt intake.  He is sedentary these days.  He is currently taking Entresto 97/103 mg p.o. twice daily, carvedilol 3.125 mg p.o. twice daily, metoprolol 100 mg p.o. daily.  Clearly double beta-blockers.  He will benefit from switching one of his beta-blockers to a different class of blood pressure medications.  He wishes to address this with his PMD.  I suggest either low-dose clonidine or HCTZ.   4) hyperlipidemia-uncontrolled lipid panel with LDL of 133.  This adds to his risk of cardiovascular disease.  He is advised to continue pravastatin 40 mg p.o. nightly.  Admittedly, he has been hesitant to take this medication.  He absolutely needs it unless his engagement with whole food plant-based diet is optimal.    5) vitamin D deficiency: Advised to continue vitamin D2 50,000 units weekly.  - Patient specific target  A1c;  LDL, HDL, Triglycerides, and  Waist Circumference were discussed in detail.   - I advised patient to maintain close follow up with Rakes, Doralee Albino, FNP for primary care needs.   I spent  26  minutes in the care of the patient today including review of labs from Thyroid Function, CMP, and other relevant labs ; imaging/biopsy records (current and previous including abstractions from other facilities); face-to-face time discussing  his lab results and symptoms, medications doses, his options of short and long  term treatment based on the latest standards of care / guidelines;   and documenting the encounter.  Mary Sella Uhrich  participated in the discussions, expressed understanding, and voiced agreement with the above plans.  All questions were answered to his satisfaction. he is encouraged to contact clinic should he have any questions or concerns prior to his return visit.    Follow up plan: No follow-ups on file.  Marquis Lunch, MD Phone: [phone]  Fax: [phone]  This note was partially dictated with voice recognition software. Similar sounding words can be transcribed inadequately or may not  be corrected upon review.  10/02/2022, 9:22 AM

## 2022-10-05 LAB — COMPREHENSIVE METABOLIC PANEL
ALT: 21 IU/L (ref 0–44)
AST: 18 IU/L (ref 0–40)
Albumin/Globulin Ratio: 1.4 (ref 1.2–2.2)
Albumin: 4.2 g/dL (ref 4.1–5.1)
Alkaline Phosphatase: 132 IU/L — ABNORMAL HIGH (ref 44–121)
BUN/Creatinine Ratio: 14 (ref 9–20)
BUN: 11 mg/dL (ref 6–24)
Bilirubin Total: <0.2 mg/dL (ref 0.0–1.2)
CO2: 21 mmol/L (ref 20–29)
Calcium: 9.9 mg/dL (ref 8.7–10.2)
Chloride: 105 mmol/L (ref 96–106)
Creatinine, Ser: 0.8 mg/dL (ref 0.76–1.27)
Globulin, Total: 2.9 g/dL (ref 1.5–4.5)
Glucose: 139 mg/dL — ABNORMAL HIGH (ref 70–99)
Potassium: 4.7 mmol/L (ref 3.5–5.2)
Sodium: 142 mmol/L (ref 134–144)
Total Protein: 7.1 g/dL (ref 6.0–8.5)
eGFR: 112 mL/min/{1.73_m2} (ref >59)

## 2022-10-05 LAB — TSH: TSH: 9.61 u[IU]/mL — ABNORMAL HIGH (ref 0.450–4.500)

## 2022-10-05 LAB — T4, FREE: Free T4: 1.63 ng/dL (ref 0.82–1.77)

## 2022-10-05 LAB — THYROGLOBULIN LEVEL: Thyroglobulin (TG-RIA): <2 ng/mL

## 2022-10-26 DIAGNOSIS — M79641 Pain in right hand: Secondary | ICD-10-CM | POA: Diagnosis not present

## 2022-10-26 DIAGNOSIS — M79642 Pain in left hand: Secondary | ICD-10-CM | POA: Diagnosis not present

## 2022-10-28 DIAGNOSIS — Z471 Aftercare following joint replacement surgery: Secondary | ICD-10-CM | POA: Diagnosis not present

## 2022-10-28 DIAGNOSIS — Z96642 Presence of left artificial hip joint: Secondary | ICD-10-CM | POA: Diagnosis not present

## 2022-11-09 DIAGNOSIS — M65332 Trigger finger, left middle finger: Secondary | ICD-10-CM | POA: Diagnosis not present

## 2022-11-09 DIAGNOSIS — M13831 Other specified arthritis, right wrist: Secondary | ICD-10-CM | POA: Diagnosis not present

## 2022-11-09 DIAGNOSIS — M109 Gout, unspecified: Secondary | ICD-10-CM | POA: Diagnosis not present

## 2022-11-09 DIAGNOSIS — M65342 Trigger finger, left ring finger: Secondary | ICD-10-CM | POA: Diagnosis not present

## 2022-11-24 ENCOUNTER — Other Ambulatory Visit: Payer: Self-pay | Admitting: "Endocrinology

## 2022-11-24 DIAGNOSIS — E119 Type 2 diabetes mellitus without complications: Secondary | ICD-10-CM

## 2022-11-25 ENCOUNTER — Other Ambulatory Visit: Payer: Self-pay | Admitting: *Deleted

## 2022-11-25 DIAGNOSIS — E781 Pure hyperglyceridemia: Secondary | ICD-10-CM

## 2022-11-25 MED ORDER — OMEGA-3-ACID ETHYL ESTERS 1 G PO CAPS: ORAL_CAPSULE | ORAL | 0 refills | Status: DC

## 2022-12-07 DIAGNOSIS — M65332 Trigger finger, left middle finger: Secondary | ICD-10-CM | POA: Diagnosis not present

## 2022-12-07 DIAGNOSIS — M65341 Trigger finger, right ring finger: Secondary | ICD-10-CM | POA: Diagnosis not present

## 2022-12-07 DIAGNOSIS — M65342 Trigger finger, left ring finger: Secondary | ICD-10-CM | POA: Diagnosis not present

## 2022-12-14 DIAGNOSIS — J029 Acute pharyngitis, unspecified: Secondary | ICD-10-CM | POA: Diagnosis not present

## 2022-12-14 DIAGNOSIS — R0981 Nasal congestion: Secondary | ICD-10-CM | POA: Diagnosis not present

## 2023-01-11 ENCOUNTER — Other Ambulatory Visit: Payer: Self-pay | Admitting: "Endocrinology

## 2023-01-11 ENCOUNTER — Other Ambulatory Visit: Payer: Self-pay | Admitting: Family Medicine

## 2023-01-11 DIAGNOSIS — C73 Malignant neoplasm of thyroid gland: Secondary | ICD-10-CM

## 2023-01-11 DIAGNOSIS — E781 Pure hyperglyceridemia: Secondary | ICD-10-CM

## 2023-01-11 DIAGNOSIS — M2550 Pain in unspecified joint: Secondary | ICD-10-CM

## 2023-01-12 ENCOUNTER — Encounter: Payer: Self-pay | Admitting: *Deleted

## 2023-02-03 ENCOUNTER — Emergency Department (HOSPITAL_BASED_OUTPATIENT_CLINIC_OR_DEPARTMENT_OTHER)
Admission: EM | Admit: 2023-02-03 | Discharge: 2023-02-03 | Disposition: A | Discharge status: Home / Self Care | Payer: BC Managed Care – PPO | Attending: Emergency Medicine | Admitting: Emergency Medicine

## 2023-02-03 ENCOUNTER — Encounter (HOSPITAL_BASED_OUTPATIENT_CLINIC_OR_DEPARTMENT_OTHER): Payer: Self-pay | Admitting: Pediatrics

## 2023-02-03 ENCOUNTER — Other Ambulatory Visit: Payer: Self-pay

## 2023-02-03 ENCOUNTER — Emergency Department (HOSPITAL_BASED_OUTPATIENT_CLINIC_OR_DEPARTMENT_OTHER): Payer: BC Managed Care – PPO

## 2023-02-03 DIAGNOSIS — Z85850 Personal history of malignant neoplasm of thyroid: Secondary | ICD-10-CM | POA: Insufficient documentation

## 2023-02-03 DIAGNOSIS — R519 Headache, unspecified: Secondary | ICD-10-CM | POA: Diagnosis not present

## 2023-02-03 DIAGNOSIS — Z79899 Other long term (current) drug therapy: Secondary | ICD-10-CM | POA: Insufficient documentation

## 2023-02-03 DIAGNOSIS — R059 Cough, unspecified: Secondary | ICD-10-CM | POA: Insufficient documentation

## 2023-02-03 DIAGNOSIS — I517 Cardiomegaly: Secondary | ICD-10-CM | POA: Diagnosis not present

## 2023-02-03 DIAGNOSIS — R079 Chest pain, unspecified: Secondary | ICD-10-CM | POA: Insufficient documentation

## 2023-02-03 DIAGNOSIS — I110 Hypertensive heart disease with heart failure: Secondary | ICD-10-CM | POA: Diagnosis not present

## 2023-02-03 DIAGNOSIS — R0602 Shortness of breath: Secondary | ICD-10-CM | POA: Insufficient documentation

## 2023-02-03 DIAGNOSIS — I700 Atherosclerosis of aorta: Secondary | ICD-10-CM | POA: Diagnosis not present

## 2023-02-03 DIAGNOSIS — I2510 Atherosclerotic heart disease of native coronary artery without angina pectoris: Secondary | ICD-10-CM | POA: Insufficient documentation

## 2023-02-03 DIAGNOSIS — I719 Aortic aneurysm of unspecified site, without rupture: Secondary | ICD-10-CM | POA: Diagnosis not present

## 2023-02-03 DIAGNOSIS — I509 Heart failure, unspecified: Secondary | ICD-10-CM | POA: Diagnosis not present

## 2023-02-03 DIAGNOSIS — R071 Chest pain on breathing: Secondary | ICD-10-CM | POA: Diagnosis not present

## 2023-02-03 DIAGNOSIS — R0789 Other chest pain: Secondary | ICD-10-CM | POA: Diagnosis not present

## 2023-02-03 DIAGNOSIS — E119 Type 2 diabetes mellitus without complications: Secondary | ICD-10-CM | POA: Insufficient documentation

## 2023-02-03 DIAGNOSIS — Z1152 Encounter for screening for COVID-19: Secondary | ICD-10-CM | POA: Insufficient documentation

## 2023-02-03 LAB — BASIC METABOLIC PANEL
Anion gap: 12 (ref 5–15)
BUN: 8 mg/dL (ref 6–20)
CO2: 19 mmol/L — ABNORMAL LOW (ref 22–32)
Calcium: 8.8 mg/dL — ABNORMAL LOW (ref 8.9–10.3)
Chloride: 105 mmol/L (ref 98–111)
Creatinine, Ser: 1.04 mg/dL (ref 0.61–1.24)
GFR, Estimated: >60 mL/min (ref >60)
Glucose, Bld: 194 mg/dL — ABNORMAL HIGH (ref 70–99)
Potassium: 3.7 mmol/L (ref 3.5–5.1)
Sodium: 136 mmol/L (ref 135–145)

## 2023-02-03 LAB — BRAIN NATRIURETIC PEPTIDE: B Natriuretic Peptide: 99.3 pg/mL (ref 0.0–100.0)

## 2023-02-03 LAB — CBC
HCT: 40.5 % (ref 39.0–52.0)
Hemoglobin: 13.8 g/dL (ref 13.0–17.0)
MCH: 29.5 pg (ref 26.0–34.0)
MCHC: 34.1 g/dL (ref 30.0–36.0)
MCV: 86.5 fL (ref 80.0–100.0)
Platelets: 208 10*3/uL (ref 150–400)
RBC: 4.68 MIL/uL (ref 4.22–5.81)
RDW: 13.3 % (ref 11.5–15.5)
WBC: 10 10*3/uL (ref 4.0–10.5)
nRBC: 0 % (ref 0.0–0.2)

## 2023-02-03 LAB — TROPONIN I (HIGH SENSITIVITY)
Troponin I (High Sensitivity): 10 ng/L (ref <18)
Troponin I (High Sensitivity): 12 ng/L (ref <18)

## 2023-02-03 LAB — SARS CORONAVIRUS 2 BY RT PCR: SARS Coronavirus 2 by RT PCR: NEGATIVE

## 2023-02-03 MED ORDER — IOHEXOL 350 MG/ML SOLN
100.0000 mL | Freq: Once | INTRAVENOUS | Status: AC | PRN
  Administered 2023-02-03: 100 mL via INTRAVENOUS

## 2023-02-03 NOTE — ED Triage Notes (Signed)
C/O shortness of breath that comes and go along with chest pain and coughing spells since Tuesday. Reports hx of HF.

## 2023-02-03 NOTE — ED Provider Notes (Signed)
[place] [facility] Provider Note   CSN: [id number] Arrival date & time: 02/03/23  1451     History {Add pertinent medical, surgical, social history, OB history to HPI:1} Chief Complaint  Patient presents with   Shortness of Breath   Chest Pain   Cough    Tyler [name] is a 47 y.o. male.  HPI     47 year old male with a history of cardiomyopathy, CHF, coronary artery disease, diabetes, hypertension, hyperlipidemia, malignant neoplasm of the thyroid gland, OSA, who presents with concern for shortness of breath, chest pain and coughing.   Cough coming and going, sore all over, has been that way since Tuesday AM, lightheadedness Chest pain worse with deep breaths Feels like flu without the fever, achy from head to toe.  No congestion or sore throat, every once in a while mild runny nose.  Coughing and shortness of breath, shortness of breath. WIll come on and stay then will feel better. Nothing brings it on.  Chest pain on left with shortness of breath.  Pressure like pain, soreness.   Will have episodes, come and go.  Not really brought on by exertion, has not really been doing much since Tuesday.  No nausea but has had low appetite.  Since Monday night.  No known sick contacts.  Not coughing up anything.   Just felt like tightness but when episode better felt better. Not palpitations.   Cardiology [facility] in [place]   Hip surgery in March Drove to [place] on Saturday and drove back Sunday     Past Medical History:  Diagnosis Date   Arthritis    Cardiomyopathy [facility])    CHF (congestive heart failure) (HCC)    Complication of anesthesia    Woke up during surgery   Coronary artery disease    Diabetes (HCC)    Gout    High cholesterol    Hypertension    Low testosterone 04/17/2021   Malignant neoplasm of thyroid gland (HCC) 04/17/2016   Pneumonia    Postsurgical hypothyroidism    thyroid cancer   Sleep apnea    Vitamin D  insufficiency     Home Medications Prior to Admission medications   Medication Sig Start Date End Date Taking? Authorizing Provider  acetaminophen (TYLENOL) 500 MG tablet Take 1,000 mg by mouth every 6 (six) hours as needed for moderate pain.    [provider]  carvedilol (COREG) 3.125 MG tablet Take 3.125 mg by mouth 2 (two) times daily with a meal. Hold if sbp is less than 105 or hr less than 60    [provider]  colchicine 0.6 MG tablet TAKE 2 TABLETS AT ONSET, THEN 1 TABLET 2 HOURS LATER- A MAXIMUM OF 3 TABLETS IN 24 HOURS Patient taking differently: TAKE 2 TABLETS AT ONSET, THEN 1 TABLET 2 HOURS LATER AS NEEDED FOR GOUT FLARES - A MAXIMUM OF 3 TABLETS IN 24 HOURS 05/21/20   Nida, Denman George, MD  ENTRESTO 97-103 MG Take 1 tablet by mouth 2 (two) times daily. 01/05/22   [provider]  levothyroxine (SYNTHROID) 200 MCG tablet Take 200 mcg by mouth daily before breakfast.    [provider]  levothyroxine (SYNTHROID) 200 MCG tablet Take 1 tablet (200 mcg total) by mouth daily. 01/15/23   Roma Kayser, MD  levothyroxine (SYNTHROID) 25 MCG tablet Take 1 tablet (25 mcg total) by mouth daily before breakfast. 10/02/22   Nida, Denman George, MD  magnesium oxide (  MAG-OX) 400 MG tablet Take 400 mg by mouth daily. 01/15/22   [provider]  metFORMIN (GLUCOPHAGE) 1000 MG tablet TAKE 1 TABLET (1,000 MG TOTAL) BY MOUTH TWICE A DAY WITH FOOD 11/25/22   Roma Kayser, MD  methocarbamol (ROBAXIN) 500 MG tablet Take 1 tablet (500 mg total) by mouth every 6 (six) hours as needed for muscle spasms. 09/08/22   Cassandria Anger, PA-C  metoprolol succinate (TOPROL-XL) 100 MG 24 hr tablet Take 1 tablet (100 mg total) by mouth daily after breakfast. 06/30/22   Nida, Denman George, MD  mupirocin ointment (BACTROBAN) 2 % Apply 1 Application topically 2 (two) times daily as needed (irritation/infection).    [provider]  nystatin  (MYCOSTATIN/NYSTOP) powder Apply 1 Application topically 2 (two) times daily as needed (irritation).    [provider]  nystatin cream (MYCOSTATIN) Apply 1 Application topically 2 (two) times daily as needed (irritation).    [provider]  omega-3 acid ethyl esters (LOVAZA) 1 g capsule TAKE 2 CAPSULES BY MOUTH TWICE A DAY 01/12/23   Sonny Masters, FNP  oxyCODONE (ROXICODONE) 5 MG immediate release tablet Take 1 tablet (5 mg total) by mouth every 4 (four) hours as needed for severe pain. 09/08/22   Cassandria Anger, PA-C  polyethylene glycol (MIRALAX / GLYCOLAX) 17 g packet Take 17 g by mouth 2 (two) times daily. 09/08/22   Cassandria Anger, PA-C  pravastatin (PRAVACHOL) 40 MG tablet TAKE 1 TABLET BY MOUTH EVERY DAY Patient taking differently: Take 40 mg by mouth at bedtime. 06/23/22   Daryll Drown, NP  TURMERIC CURCUMIN PO Take 1,000 mg by mouth 2 (two) times daily.    [provider]  valACYclovir (VALTREX) 1000 MG tablet Take 1,000-2,000 mg by mouth See admin instructions. Take 2000 mg at onset of fever blisters, then take 1000 mg daily as needed    [provider]  Vitamin D, Ergocalciferol, (DRISDOL) 1.25 MG (50000 UNIT) CAPS capsule Take 50,000 Units by mouth every Sunday.    [provider]      Allergies    Isosorbide    Review of Systems   Review of Systems  Physical Exam Updated Vital Signs BP (!) 170/131 (BP Location: Right Arm)   Pulse 91   Temp 98.2 F (36.8 C) (Oral)   Resp 20   Ht 5\' 10"  (1.778 m)   Wt 127 kg   SpO2 98%   BMI 40.18 kg/m  Physical Exam  ED Results / Procedures / Treatments   Labs (all labs ordered are listed, but only abnormal results are displayed) Labs Reviewed  BASIC METABOLIC PANEL - Abnormal; Notable for the following components:      Result Value   CO2 19 (*)    Glucose, Bld 194 (*)    Calcium 8.8 (*)    All other components within normal limits  SARS CORONAVIRUS 2 BY RT PCR  CBC  BRAIN  NATRIURETIC PEPTIDE  TROPONIN I (HIGH SENSITIVITY)  TROPONIN I (HIGH SENSITIVITY)    EKG EKG Interpretation Date/Time:  Wednesday February 03 2023 15:01:30 EDT Ventricular Rate:  91 PR Interval:  241 QRS Duration:  142 QT Interval:  378 QTC Calculation: 466 R Axis:   -49  Text Interpretation: Sinus rhythm Prolonged PR interval Left bundle branch block No significant change since last tracing Rate faster Confirmed by Alvira Monday ([id number]) on 02/03/2023 3:17:25 PM  Radiology DG Chest 2 View  Result Date: 02/03/2023 CLINICAL DATA:  Chest pain, shortness of breath EXAM: CHEST - 2 VIEW COMPARISON:  06/18/2022 FINDINGS: Mild cardiomegaly. Both lungs are clear. The visualized skeletal structures are unremarkable. IMPRESSION: Mild cardiomegaly without acute abnormality of the lungs. Electronically Signed   By: Jearld Lesch M.D.   On: 02/03/2023 15:54    Procedures Procedures  {Document cardiac monitor, telemetry assessment procedure when appropriate:1}  Medications Ordered in ED Medications - No data to display  ED Course/ Medical Decision Making/ A&P   {   Click here for ABCD2, HEART and other calculatorsREFRESH Note before signing :1}                              Medical Decision Making Amount and/or Complexity of Data Reviewed Labs: ordered. Radiology: ordered.   ***   Labs completed and personally read interpreted by me show normal troponin doubt ACS, normal BNP, low suspicion for CHF exacerbation.  BMP without clinically significant electrolyte abnormalities with mild hyperglycemia and mild acidosis.  CBC without signs of anemia or leukocytosis.  COVID-19 testing is negative.  Chest x-ray completed and personally about interpreted by me shows cardiomegaly without signs of edema or pneumonia.   {Document critical care time when appropriate:1} {Document review of labs and clinical decision tools ie heart score, Chads2Vasc2 etc:1}  {Document your independent review of  radiology images, and any outside records:1} {Document your discussion with family members, caretakers, and with consultants:1} {Document social determinants of health affecting pt's care:1} {Document your decision making why or why not admission, treatments were needed:1} Final Clinical Impression(s) / ED Diagnoses Final diagnoses:  None    Rx / DC Orders ED Discharge Orders     None

## 2023-02-03 NOTE — ED Notes (Signed)
Unable to obtain IV access, ultrasound IV in process

## 2023-02-11 ENCOUNTER — Encounter: Payer: BC Managed Care – PPO | Admitting: Family Medicine

## 2023-02-12 ENCOUNTER — Encounter: Payer: BC Managed Care – PPO | Admitting: Family Medicine

## 2023-02-18 ENCOUNTER — Emergency Department (HOSPITAL_BASED_OUTPATIENT_CLINIC_OR_DEPARTMENT_OTHER): Payer: BC Managed Care – PPO

## 2023-02-18 ENCOUNTER — Other Ambulatory Visit: Payer: Self-pay

## 2023-02-18 ENCOUNTER — Encounter (HOSPITAL_BASED_OUTPATIENT_CLINIC_OR_DEPARTMENT_OTHER): Payer: Self-pay

## 2023-02-18 ENCOUNTER — Inpatient Hospital Stay (HOSPITAL_BASED_OUTPATIENT_CLINIC_OR_DEPARTMENT_OTHER)
Admission: EM | Admit: 2023-02-18 | Discharge: 2023-02-20 | DRG: 683 | Disposition: A | Discharge status: Home / Self Care | Payer: BC Managed Care – PPO | Attending: Student | Admitting: Student

## 2023-02-18 DIAGNOSIS — M109 Gout, unspecified: Secondary | ICD-10-CM | POA: Diagnosis present

## 2023-02-18 DIAGNOSIS — Z79890 Hormone replacement therapy (postmenopausal): Secondary | ICD-10-CM

## 2023-02-18 DIAGNOSIS — Z85850 Personal history of malignant neoplasm of thyroid: Secondary | ICD-10-CM | POA: Diagnosis not present

## 2023-02-18 DIAGNOSIS — E291 Testicular hypofunction: Secondary | ICD-10-CM | POA: Diagnosis present

## 2023-02-18 DIAGNOSIS — E890 Postprocedural hypothyroidism: Secondary | ICD-10-CM

## 2023-02-18 DIAGNOSIS — Z6841 Body Mass Index (BMI) 40.0 and over, adult: Secondary | ICD-10-CM

## 2023-02-18 DIAGNOSIS — Z83438 Family history of other disorder of lipoprotein metabolism and other lipidemia: Secondary | ICD-10-CM

## 2023-02-18 DIAGNOSIS — R1031 Right lower quadrant pain: Secondary | ICD-10-CM | POA: Diagnosis not present

## 2023-02-18 DIAGNOSIS — N179 Acute kidney failure, unspecified: Secondary | ICD-10-CM | POA: Diagnosis not present

## 2023-02-18 DIAGNOSIS — Z7984 Long term (current) use of oral hypoglycemic drugs: Secondary | ICD-10-CM | POA: Diagnosis not present

## 2023-02-18 DIAGNOSIS — I5042 Chronic combined systolic (congestive) and diastolic (congestive) heart failure: Secondary | ICD-10-CM | POA: Diagnosis not present

## 2023-02-18 DIAGNOSIS — Z720 Tobacco use: Secondary | ICD-10-CM

## 2023-02-18 DIAGNOSIS — E7800 Pure hypercholesterolemia, unspecified: Secondary | ICD-10-CM | POA: Diagnosis not present

## 2023-02-18 DIAGNOSIS — Z888 Allergy status to other drugs, medicaments and biological substances status: Secondary | ICD-10-CM

## 2023-02-18 DIAGNOSIS — N132 Hydronephrosis with renal and ureteral calculous obstruction: Secondary | ICD-10-CM | POA: Diagnosis not present

## 2023-02-18 DIAGNOSIS — I110 Hypertensive heart disease with heart failure: Secondary | ICD-10-CM | POA: Diagnosis not present

## 2023-02-18 DIAGNOSIS — E876 Hypokalemia: Secondary | ICD-10-CM | POA: Diagnosis not present

## 2023-02-18 DIAGNOSIS — E860 Dehydration: Secondary | ICD-10-CM | POA: Diagnosis not present

## 2023-02-18 DIAGNOSIS — Z96643 Presence of artificial hip joint, bilateral: Secondary | ICD-10-CM | POA: Diagnosis present

## 2023-02-18 DIAGNOSIS — I428 Other cardiomyopathies: Secondary | ICD-10-CM | POA: Diagnosis not present

## 2023-02-18 DIAGNOSIS — I10 Essential (primary) hypertension: Secondary | ICD-10-CM

## 2023-02-18 DIAGNOSIS — I152 Hypertension secondary to endocrine disorders: Secondary | ICD-10-CM | POA: Diagnosis present

## 2023-02-18 DIAGNOSIS — M79604 Pain in right leg: Secondary | ICD-10-CM | POA: Diagnosis present

## 2023-02-18 DIAGNOSIS — E1165 Type 2 diabetes mellitus with hyperglycemia: Secondary | ICD-10-CM

## 2023-02-18 DIAGNOSIS — N201 Calculus of ureter: Secondary | ICD-10-CM

## 2023-02-18 DIAGNOSIS — N200 Calculus of kidney: Secondary | ICD-10-CM | POA: Diagnosis not present

## 2023-02-18 DIAGNOSIS — F17200 Nicotine dependence, unspecified, uncomplicated: Secondary | ICD-10-CM | POA: Diagnosis present

## 2023-02-18 DIAGNOSIS — Z7982 Long term (current) use of aspirin: Secondary | ICD-10-CM | POA: Diagnosis not present

## 2023-02-18 DIAGNOSIS — I2510 Atherosclerotic heart disease of native coronary artery without angina pectoris: Secondary | ICD-10-CM | POA: Diagnosis not present

## 2023-02-18 DIAGNOSIS — R109 Unspecified abdominal pain: Secondary | ICD-10-CM | POA: Diagnosis not present

## 2023-02-18 DIAGNOSIS — E1159 Type 2 diabetes mellitus with other circulatory complications: Secondary | ICD-10-CM | POA: Diagnosis present

## 2023-02-18 DIAGNOSIS — F17210 Nicotine dependence, cigarettes, uncomplicated: Secondary | ICD-10-CM | POA: Diagnosis not present

## 2023-02-18 DIAGNOSIS — Z79899 Other long term (current) drug therapy: Secondary | ICD-10-CM

## 2023-02-18 DIAGNOSIS — Z8249 Family history of ischemic heart disease and other diseases of the circulatory system: Secondary | ICD-10-CM

## 2023-02-18 DIAGNOSIS — N130 Hydronephrosis with ureteropelvic junction obstruction: Secondary | ICD-10-CM | POA: Diagnosis not present

## 2023-02-18 DIAGNOSIS — R1030 Lower abdominal pain, unspecified: Secondary | ICD-10-CM | POA: Diagnosis not present

## 2023-02-18 DIAGNOSIS — G4730 Sleep apnea, unspecified: Secondary | ICD-10-CM | POA: Diagnosis present

## 2023-02-18 LAB — COMPREHENSIVE METABOLIC PANEL
ALT: 39 U/L (ref 0–44)
AST: 30 U/L (ref 15–41)
Albumin: 3.9 g/dL (ref 3.5–5.0)
Alkaline Phosphatase: 67 U/L (ref 38–126)
Anion gap: 12 (ref 5–15)
BUN: 17 mg/dL (ref 6–20)
CO2: 21 mmol/L — ABNORMAL LOW (ref 22–32)
Calcium: 9.3 mg/dL (ref 8.9–10.3)
Chloride: 104 mmol/L (ref 98–111)
Creatinine, Ser: 2.23 mg/dL — ABNORMAL HIGH (ref 0.61–1.24)
GFR, Estimated: 36 mL/min — ABNORMAL LOW (ref >60)
Glucose, Bld: 215 mg/dL — ABNORMAL HIGH (ref 70–99)
Potassium: 3.6 mmol/L (ref 3.5–5.1)
Sodium: 137 mmol/L (ref 135–145)
Total Bilirubin: 0.7 mg/dL (ref 0.3–1.2)
Total Protein: 7.3 g/dL (ref 6.5–8.1)

## 2023-02-18 LAB — BASIC METABOLIC PANEL
Anion gap: 11 (ref 5–15)
BUN: 17 mg/dL (ref 6–20)
CO2: 24 mmol/L (ref 22–32)
Calcium: 8.7 mg/dL — ABNORMAL LOW (ref 8.9–10.3)
Chloride: 106 mmol/L (ref 98–111)
Creatinine, Ser: 2.03 mg/dL — ABNORMAL HIGH (ref 0.61–1.24)
GFR, Estimated: 41 mL/min — ABNORMAL LOW (ref >60)
Glucose, Bld: 105 mg/dL — ABNORMAL HIGH (ref 70–99)
Potassium: 3.5 mmol/L (ref 3.5–5.1)
Sodium: 141 mmol/L (ref 135–145)

## 2023-02-18 LAB — CK: Total CK: 70 U/L (ref 49–397)

## 2023-02-18 LAB — URINALYSIS, ROUTINE W REFLEX MICROSCOPIC

## 2023-02-18 LAB — CBC
HCT: 40.4 % (ref 39.0–52.0)
Hemoglobin: 13.5 g/dL (ref 13.0–17.0)
MCH: 29.7 pg (ref 26.0–34.0)
MCHC: 33.4 g/dL (ref 30.0–36.0)
MCV: 88.8 fL (ref 80.0–100.0)
Platelets: 249 10*3/uL (ref 150–400)
RBC: 4.55 MIL/uL (ref 4.22–5.81)
RDW: 13.7 % (ref 11.5–15.5)
WBC: 10.8 10*3/uL — ABNORMAL HIGH (ref 4.0–10.5)
nRBC: 0 % (ref 0.0–0.2)

## 2023-02-18 LAB — HEPATIC FUNCTION PANEL
ALT: 33 U/L (ref 0–44)
AST: 21 U/L (ref 15–41)
Albumin: 3.5 g/dL (ref 3.5–5.0)
Alkaline Phosphatase: 62 U/L (ref 38–126)
Bilirubin, Direct: 0.1 mg/dL (ref 0.0–0.2)
Indirect Bilirubin: 0.4 mg/dL (ref 0.3–0.9)
Total Bilirubin: 0.5 mg/dL (ref 0.3–1.2)
Total Protein: 6.7 g/dL (ref 6.5–8.1)

## 2023-02-18 LAB — URINALYSIS, COMPLETE (UACMP) WITH MICROSCOPIC
Bilirubin Urine: NEGATIVE
Glucose, UA: NEGATIVE mg/dL
Ketones, ur: NEGATIVE mg/dL
Leukocytes,Ua: NEGATIVE
Nitrite: NEGATIVE
Protein, ur: NEGATIVE mg/dL
Specific Gravity, Urine: 1.013 (ref 1.005–1.030)
pH: 5 (ref 5.0–8.0)

## 2023-02-18 LAB — URINALYSIS, MICROSCOPIC (REFLEX): RBC / HPF: >50 RBC/hpf (ref 0–5)

## 2023-02-18 LAB — GLUCOSE, CAPILLARY: Glucose-Capillary: 143 mg/dL — ABNORMAL HIGH (ref 70–99)

## 2023-02-18 LAB — PHOSPHORUS: Phosphorus: 3.4 mg/dL (ref 2.5–4.6)

## 2023-02-18 LAB — MAGNESIUM: Magnesium: 1.7 mg/dL (ref 1.7–2.4)

## 2023-02-18 MED ORDER — ONDANSETRON HCL 4 MG PO TABS: 4.0000 mg | ORAL_TABLET | Freq: Four times a day (QID) | ORAL | Status: DC | PRN

## 2023-02-18 MED ORDER — SODIUM CHLORIDE 0.9 % IV SOLN: INTRAVENOUS | Status: AC

## 2023-02-18 MED ORDER — MAGNESIUM SULFATE IN D5W 1-5 GM/100ML-% IV SOLN
1.0000 g | Freq: Once | INTRAVENOUS | Status: AC
  Administered 2023-02-18: 1 g via INTRAVENOUS
  Filled 2023-02-18: qty 100

## 2023-02-18 MED ORDER — SODIUM CHLORIDE 0.9 % IV BOLUS
1000.0000 mL | Freq: Once | INTRAVENOUS | Status: AC
  Administered 2023-02-18: 1000 mL via INTRAVENOUS

## 2023-02-18 MED ORDER — ACETAMINOPHEN 325 MG PO TABS: 650.0000 mg | ORAL_TABLET | Freq: Four times a day (QID) | ORAL | Status: DC | PRN

## 2023-02-18 MED ORDER — PRAVASTATIN SODIUM 40 MG PO TABS
40.0000 mg | ORAL_TABLET | Freq: Every day | ORAL | Status: DC
  Administered 2023-02-18 – 2023-02-19 (×2): 40 mg via ORAL
  Filled 2023-02-18 (×2): qty 1

## 2023-02-18 MED ORDER — FENTANYL CITRATE PF 50 MCG/ML IJ SOSY: 12.5000 ug | PREFILLED_SYRINGE | INTRAMUSCULAR | Status: DC | PRN

## 2023-02-18 MED ORDER — ACETAMINOPHEN 650 MG RE SUPP: 650.0000 mg | Freq: Four times a day (QID) | RECTAL | Status: DC | PRN

## 2023-02-18 MED ORDER — HYDROCODONE-ACETAMINOPHEN 5-325 MG PO TABS: 1.0000 | ORAL_TABLET | ORAL | Status: DC | PRN

## 2023-02-18 MED ORDER — ONDANSETRON HCL 4 MG/2ML IJ SOLN: 4.0000 mg | Freq: Four times a day (QID) | INTRAMUSCULAR | Status: DC | PRN

## 2023-02-18 MED ORDER — LEVOTHYROXINE SODIUM 100 MCG PO TABS
200.0000 ug | ORAL_TABLET | Freq: Every day | ORAL | Status: DC
  Administered 2023-02-19 – 2023-02-20 (×2): 200 ug via ORAL
  Filled 2023-02-18 (×2): qty 2

## 2023-02-18 MED ORDER — KETOROLAC TROMETHAMINE 15 MG/ML IJ SOLN
15.0000 mg | Freq: Once | INTRAMUSCULAR | Status: DC
  Filled 2023-02-18: qty 1

## 2023-02-18 MED ORDER — INSULIN ASPART 100 UNIT/ML IJ SOLN
0.0000 [IU] | INTRAMUSCULAR | Status: DC
  Administered 2023-02-19 (×2): 2 [IU] via SUBCUTANEOUS
  Administered 2023-02-19 – 2023-02-20 (×4): 1 [IU] via SUBCUTANEOUS

## 2023-02-18 NOTE — Assessment & Plan Note (Signed)
Urology is aware. Have seen in consult.  N.p.o. postmidnight in case needs procedures done

## 2023-02-18 NOTE — Assessment & Plan Note (Addendum)
-   currently appears to be slightly on the dry side,  , carefuly follow fluid status and Cr Hold Entresto given AKI

## 2023-02-18 NOTE — Assessment & Plan Note (Signed)
Follow-up as an outpatient with nutrition °

## 2023-02-18 NOTE — Consult Note (Signed)
Urology Consult   Physician requesting consult: Dr. Therisa Doyne  Reason for consult: Right ureteral calculus with AKI  History of Present Illness: Tyler [name] is a 47 y.o. followed by Dr. Kasandra Knudsen for testosterone deficiency who presented to the ED with a 1-2 day history of hematuria and right flank and abdominal pain.  He has had some nausea but denies fever.  CT imaging demonstrated a small, distal right ureteral stone with right hydronephrosis.  He also had a contralateral 5 mm right renal pelvic stone that was nonobstructing.  He did have an acute kidney injury with a serum creatinine of approximately 2.0 which was increased from his baseline of 1.0.  He also has other major medical problems including congestive heart failure due to cardiomyopathy.  He was admitted for observation due to his acute kidney injury and unilateral ureteral obstruction.   Past Medical History:  Diagnosis Date   Arthritis    Cardiomyopathy [facility])    CHF (congestive heart failure) (HCC)    Complication of anesthesia    Woke up during surgery   Coronary artery disease    Diabetes (HCC)    Gout    High cholesterol    Hypertension    Low testosterone 04/17/2021   Malignant neoplasm of thyroid gland (HCC) 04/17/2016   Pneumonia    Postsurgical hypothyroidism    thyroid cancer   Sleep apnea    Vitamin D insufficiency     Past Surgical History:  Procedure Laterality Date   EYE SURGERY Left    cataract removal   FRACTURE SURGERY Left 06/23/1987   ankle   THYROIDECTOMY N/A 03/03/2013   Procedure: TOTAL THYROIDECTOMY;  Surgeon: Dalia Heading, MD;  Location: [facility];  Service: General;  Laterality: N/A;   TOTAL HIP ARTHROPLASTY Right 07/28/2021   TOTAL HIP ARTHROPLASTY Left 09/08/2022   Procedure: TOTAL HIP ARTHROPLASTY ANTERIOR APPROACH;  Surgeon: Durene Romans, MD;  Location: [facility];  Service: Orthopedics;  Laterality: Left;    Current Hospital Medications:  Home Meds:  No current  facility-administered medications on file prior to encounter.   Current Outpatient Medications on File Prior to Encounter  Medication Sig Dispense Refill   acetaminophen (TYLENOL) 500 MG tablet Take 1,000 mg by mouth every 6 (six) hours as needed for moderate pain.     carvedilol (COREG) 3.125 MG tablet Take 3.125 mg by mouth 2 (two) times daily with a meal. Hold if sbp is less than 105 or hr less than 60     colchicine 0.6 MG tablet TAKE 2 TABLETS AT ONSET, THEN 1 TABLET 2 HOURS LATER- A MAXIMUM OF 3 TABLETS IN 24 HOURS (Patient taking differently: TAKE 2 TABLETS AT ONSET, THEN 1 TABLET 2 HOURS LATER AS NEEDED FOR GOUT FLARES - A MAXIMUM OF 3 TABLETS IN 24 HOURS) 270 tablet 0   ENTRESTO 97-103 MG Take 1 tablet by mouth 2 (two) times daily.     levothyroxine (SYNTHROID) 200 MCG tablet Take 200 mcg by mouth daily before breakfast.     levothyroxine (SYNTHROID) 200 MCG tablet Take 1 tablet (200 mcg total) by mouth daily. 90 tablet 1   levothyroxine (SYNTHROID) 25 MCG tablet Take 1 tablet (25 mcg total) by mouth daily before breakfast. 90 tablet 1   magnesium oxide (MAG-OX) 400 MG tablet Take 400 mg by mouth daily.     metFORMIN (GLUCOPHAGE) 1000 MG tablet TAKE 1 TABLET (1,000 MG TOTAL) BY MOUTH TWICE A DAY WITH FOOD 180 tablet 0  methocarbamol (ROBAXIN) 500 MG tablet Take 1 tablet (500 mg total) by mouth every 6 (six) hours as needed for muscle spasms. 40 tablet 2   metoprolol succinate (TOPROL-XL) 100 MG 24 hr tablet Take 1 tablet (100 mg total) by mouth daily after breakfast. 90 tablet 1   mupirocin ointment (BACTROBAN) 2 % Apply 1 Application topically 2 (two) times daily as needed (irritation/infection).     nystatin (MYCOSTATIN/NYSTOP) powder Apply 1 Application topically 2 (two) times daily as needed (irritation).     nystatin cream (MYCOSTATIN) Apply 1 Application topically 2 (two) times daily as needed (irritation).     omega-3 acid ethyl esters (LOVAZA) 1 g capsule TAKE 2 CAPSULES BY MOUTH  TWICE A DAY 360 capsule 0   oxyCODONE (ROXICODONE) 5 MG immediate release tablet Take 1 tablet (5 mg total) by mouth every 4 (four) hours as needed for severe pain. 42 tablet 0   polyethylene glycol (MIRALAX / GLYCOLAX) 17 g packet Take 17 g by mouth 2 (two) times daily. 14 each 0   pravastatin (PRAVACHOL) 40 MG tablet TAKE 1 TABLET BY MOUTH EVERY DAY (Patient taking differently: Take 40 mg by mouth at bedtime.) 90 tablet 0   TURMERIC CURCUMIN PO Take 1,000 mg by mouth 2 (two) times daily.     valACYclovir (VALTREX) 1000 MG tablet Take 1,000-2,000 mg by mouth See admin instructions. Take 2000 mg at onset of fever blisters, then take 1000 mg daily as needed     Vitamin D, Ergocalciferol, (DRISDOL) 1.25 MG (50000 UNIT) CAPS capsule Take 50,000 Units by mouth every Sunday.       Scheduled Meds: Continuous Infusions: PRN Meds:.  Allergies:  Allergies  Allergen Reactions   Isosorbide Other (See Comments)    Low blood pressure    Family History  Problem Relation Age of Onset   Hypertension Father    Hyperlipidemia Father     Social History:  reports that he has been smoking cigarettes. He has a 5 pack-year smoking history. He has quit using smokeless tobacco. He reports current alcohol use. He reports that he does not use drugs.  ROS: A complete review of systems was performed.  All systems are negative except for pertinent findings as noted.  Physical Exam:  Vital signs in last 24 hours: Temp:  [97.7 F (36.5 C)] 97.7 F (36.5 C) (08/29 1940) Pulse Rate:  [74-101] 74 (08/29 1940) Resp:  [16] 16 (08/29 1513) BP: (116-141)/(92-106) 141/106 (08/29 1940) SpO2:  [97 %] 97 % (08/29 1940) Weight:  [122.5 kg] 122.5 kg (08/29 1512) Constitutional:  Alert and oriented, No acute distress Cardiovascular: No JVD Respiratory: Normal respiratory effort GI: Abdomen is soft, nontender, nondistended, no abdominal masses GU: Mild right CVA tenderness Lymphatic: No lymphadenopathy Neurologic:  Grossly intact, no focal deficits Psychiatric: Normal mood and affect  Laboratory Data:  Recent Labs    02/18/23 1525  WBC 10.8*  HGB 13.5  HCT 40.4  PLT 249    Recent Labs    02/18/23 1530 02/18/23 1754  NA 137 141  K 3.6 3.5  CL 104 106  GLUCOSE 215* 105*  BUN 17 17  CALCIUM 9.3 8.7*  CREATININE 2.23* 2.03*     Results for orders placed or performed during the hospital encounter of 02/18/23 (from the past 24 hour(s))  CBC     Status: Abnormal   Collection Time: 02/18/23  3:25 PM  Result Value Ref Range   WBC 10.8 (H) 4.0 - 10.5 K/uL   RBC  4.55 4.22 - 5.81 MIL/uL   Hemoglobin 13.5 13.0 - 17.0 g/dL   HCT 01.0 93.2 - 35.5 %   MCV 88.8 80.0 - 100.0 fL   MCH 29.7 26.0 - 34.0 pg   MCHC 33.4 30.0 - 36.0 g/dL   RDW 73.2 20.2 - 54.2 %   Platelets 249 150 - 400 K/uL   nRBC 0.0 0.0 - 0.2 %  Comprehensive metabolic panel     Status: Abnormal   Collection Time: 02/18/23  3:30 PM  Result Value Ref Range   Sodium 137 135 - 145 mmol/L   Potassium 3.6 3.5 - 5.1 mmol/L   Chloride 104 98 - 111 mmol/L   CO2 21 (L) 22 - 32 mmol/L   Glucose, Bld 215 (H) 70 - 99 mg/dL   BUN 17 6 - 20 mg/dL   Creatinine, Ser 7.06 (H) 0.61 - 1.24 mg/dL   Calcium 9.3 8.9 - 23.7 mg/dL   Total Protein 7.3 6.5 - 8.1 g/dL   Albumin 3.9 3.5 - 5.0 g/dL   AST 30 15 - 41 U/L   ALT 39 0 - 44 U/L   Alkaline Phosphatase 67 38 - 126 U/L   Total Bilirubin 0.7 0.3 - 1.2 mg/dL   GFR, Estimated 36 (L) >60 mL/min   Anion gap 12 5 - 15  CK     Status: None   Collection Time: 02/18/23  3:30 PM  Result Value Ref Range   Total CK 70 49 - 397 U/L  Urinalysis, Routine w reflex microscopic -Urine, Unspecified Source     Status: Abnormal   Collection Time: 02/18/23  3:53 PM  Result Value Ref Range   Color, Urine BROWN (A) YELLOW   APPearance TURBID (A) CLEAR   Specific Gravity, Urine  1.005 - 1.030    TEST NOT REPORTED DUE TO COLOR INTERFERENCE OF URINE PIGMENT   pH  5.0 - 8.0    TEST NOT REPORTED DUE TO COLOR  INTERFERENCE OF URINE PIGMENT   Glucose, UA (A) NEGATIVE mg/dL    TEST NOT REPORTED DUE TO COLOR INTERFERENCE OF URINE PIGMENT   Hgb urine dipstick (A) NEGATIVE    TEST NOT REPORTED DUE TO COLOR INTERFERENCE OF URINE PIGMENT   Bilirubin Urine (A) NEGATIVE    TEST NOT REPORTED DUE TO COLOR INTERFERENCE OF URINE PIGMENT   Ketones, ur (A) NEGATIVE mg/dL    TEST NOT REPORTED DUE TO COLOR INTERFERENCE OF URINE PIGMENT   Protein, ur (A) NEGATIVE mg/dL    TEST NOT REPORTED DUE TO COLOR INTERFERENCE OF URINE PIGMENT   Nitrite (A) NEGATIVE    TEST NOT REPORTED DUE TO COLOR INTERFERENCE OF URINE PIGMENT   Leukocytes,Ua (A) NEGATIVE    TEST NOT REPORTED DUE TO COLOR INTERFERENCE OF URINE PIGMENT  Urinalysis, Microscopic (reflex)     Status: Abnormal   Collection Time: 02/18/23  3:53 PM  Result Value Ref Range   RBC / HPF >50 0 - 5 RBC/hpf   WBC, UA 6-10 0 - 5 WBC/hpf   Bacteria, UA FEW (A) NONE SEEN   Squamous Epithelial / HPF 6-10 0 - 5 /HPF  Basic metabolic panel     Status: Abnormal   Collection Time: 02/18/23  5:54 PM  Result Value Ref Range   Sodium 141 135 - 145 mmol/L   Potassium 3.5 3.5 - 5.1 mmol/L   Chloride 106 98 - 111 mmol/L   CO2 24 22 - 32 mmol/L   Glucose, Bld 105 (H) 70 -  99 mg/dL   BUN 17 6 - 20 mg/dL   Creatinine, Ser 1.91 (H) 0.61 - 1.24 mg/dL   Calcium 8.7 (L) 8.9 - 10.3 mg/dL   GFR, Estimated 41 (L) >60 mL/min   Anion gap 11 5 - 15   No results found for this or any previous visit (from the past 240 hour(s)).  Renal Function: Recent Labs    02/18/23 1530 02/18/23 1754  CREATININE 2.23* 2.03*   Estimated Creatinine Clearance: 61 mL/min (A) (by C-G formula based on SCr of 2.03 mg/dL (H)).  Radiologic Imaging: CT Renal Stone Study  Result Date: 02/18/2023 CLINICAL DATA:  Blood in urine for 3 days.  Lower abdominal pain EXAM: CT ABDOMEN AND PELVIS WITHOUT CONTRAST TECHNIQUE: Multidetector CT imaging of the abdomen and pelvis was performed following the  standard protocol without IV contrast. RADIATION DOSE REDUCTION: This exam was performed according to the departmental dose-optimization program which includes automated exposure control, adjustment of the mA and/or kV according to patient size and/or use of iterative reconstruction technique. COMPARISON:  None Available. FINDINGS: Lower chest: Slight linear opacity seen along bases likely scar or atelectasis. No pleural effusion coronary artery calcifications are seen. Coronary artery calcifications are seen. Please correlate for other coronary risk factors. Hepatobiliary: On this non IV contrast exam there is diffuse fatty liver infiltration gallbladder is nondilated. Pancreas: Unremarkable. No pancreatic ductal dilatation or surrounding inflammatory changes. Spleen: Normal in size without focal abnormality. Adrenals/Urinary Tract: Adrenal glands are preserved. There is some perinephric stranding on the right with slight ectasia of the renal collecting system down to the level of the bladder. The low pelvis and bladder obscured by the significant streak artifact from both hip arthroplasties but there appears to be a tiny stone in the very distal right ureter proximal to the UVJ best seen on coronal series 601, image 78 and axial series 301, image 93. There are some separate nonobstructing left-sided renal stones measuring up to 3-4 mm. No left ureteral stone or collecting system dilatation. Grossly preserved contours of the urinary bladder but again obscured. Stomach/Bowel: On this non oral contrast exam, the large bowel has a normal course and caliber with some fluid and stool. Normal appendix extends inferior and medial to the cecum in the right lower quadrant. Moderate debris and fluid in the stomach. Small bowel is nondilated. Vascular/Lymphatic: Aortic atherosclerosis. No enlarged abdominal or pelvic lymph nodes. Reproductive: Obscured by the streak artifact. Other: No free intra-air or free fluid.  Musculoskeletal: Degenerative changes seen of the spine and pelvis. In particular along the lower lumbar spine with disc bulging and stenosis. Again bilateral hip arthroplasties with associated streak artifact obscuring the surrounding tissues. IMPRESSION: Punctate stone seen in the extreme distal right ureter just proximal to the UVJ. Mild collecting system dilatation proximally with perinephric stranding. Nonobstructing left-sided renal stones. Normal appendix. Portions of the pelvis are obscured by the significant streak artifact from the bilateral hip arthroplasties. Electronically Signed   By: Karen Kays M.D.   On: 02/18/2023 16:57    I independently reviewed the above imaging studies.  Impression/Recommendation:  1.  Right ureteral calculus/acute kidney injury: His acute kidney injury is most likely related to recent dehydration.  I would recommend following his renal function which hopefully will be improving by morning with IV fluids.  His right ureteral stone is small and likely should pass spontaneously.  Continue to strain urine.  N.p.o. after midnight in case his renal function is not improving.  His renal function  is improving, he likely can be discharged home tomorrow with outpatient follow-up.  I will notify Dr. Arita Miss of his admission.  Crecencio Mc 02/18/2023, 7:44 PM    Moody Bruins MD   CC: Dr. Adela Glimpse

## 2023-02-18 NOTE — ED Notes (Addendum)
Spoke with EDP. Open to prioritizing PO fluids first and evaluating nausea.

## 2023-02-18 NOTE — ED Notes (Signed)
Pt given bedside urinal to attempt to pee, due to being hooked up to IV. UA obtained.

## 2023-02-18 NOTE — Plan of Care (Signed)

## 2023-02-18 NOTE — H&P (Signed)
Tyler [name] XBJ:[id number] DOB: January 31, 1976 DOA: 02/18/2023     PCP: Sonny Masters, FNP   [facility]  CARDS:  Dr. Wille Glaser, MD     Urology Dr. Laverle Patter Endocrinology  [facility]  Patient arrived to ER on 02/18/23 at 1507 Referred by Attending Therisa Doyne, MD   Patient coming from:    home Lives With family     Chief Complaint:   Chief Complaint  Patient presents with   Hematuria    HPI: Tyler [name] is a 47 y.o. 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 intake only drank very little bit of water notices urine has been turning dark in color and also noted his pain continued.  Presented today Pain started in the right flank with radiation to the right inguinal region.  Intermittent but overall mild he was able to stay on a job no dysuria no fevers no chills no nausea no vomiting he was seen in the emergency department about 2 weeks ago with what felt to be a viral illness. Patient does has history of cardiomyopathy followed by cardiology     Denies significant ETOH intake   Does  smoke but interested in quitting  Lab Results  Component Value Date   SARSCOV2NAA NEGATIVE 02/03/2023   SARSCOV2NAA NEGATIVE 06/09/2022    Regarding pertinent Chronic problems:    Hyperlipidemia - on statins Pravachol Lipid Panel     Component Value Date/Time   CHOL 194 04/23/2022 0821   TRIG 148 04/23/2022 0821   HDL 35 (L) 04/23/2022 0821   CHOLHDL 5.5 (H) 04/23/2022 0821   LDLCALC 132 (H) 04/23/2022 0821   LABVLDL 27 04/23/2022 0821   History of gout on colchicine  HTN on Coreg   chronic CHF  systolic/ combined - last echo October 2023 No results found for this or any previous visit (from the past 30865 hour(s)). There is mild global  hypokinesis of the left ventricle. Doppler parameters are indeterminate for diastolic function, but Grade 1 diastolic dysfunction more likely present.   EF: 45-49%.  on Entresto     CAD  - On Aspirin, statin, betablocker,                 - followed by cardiology                    DM 2 -  Lab Results  Component Value Date   HGBA1C 7.1 (H) 08/28/2022   on PO meds only,     Hypothyroidism:   Lab Results  Component Value Date   TSH 9.610 (H) 09/25/2022   T4TOTAL 9.5 03/27/2022   on synthroid   obesity-   BMI Readings from Last 1 Encounters:  02/18/23 38.74 kg/m       While in ER: Clinical Course as of 02/18/23 2117  Thu Feb 18, 2023  1801 Conversation was had with patient regarding admission given evidence of AKI in the setting of dehydration and kidney stone.  He elected for repeat assessment of BMP after IV fluids were given and making decisions thereafter. [CR]  1823 Consulted Dr. Laverle Patter of urology who recommended admission to [facility] for monitoring of patient's renal function.  No intervention at this time. [CR]  1857 Consulted Dr. Adela Glimpse of hospitalist who  agreed with admission and assume further treatment/care [CR]    Clinical Course User Index [CR] Peter Garter, PA     Lab Orders         Urine Culture         Urinalysis, Routine w reflex microscopic -Urine, Unspecified Source         CBC         Comprehensive metabolic panel         CK         Urinalysis, Microscopic (reflex)         Basic metabolic panel         Hepatic function panel         Magnesium         Phosphorus         TSH         Sodium, urine, random         Creatinine, urine, random      CTabd/pelvis - Punctate stone seen in the extreme distal right ureter just proximal to the UVJ. Mild collecting system dilatation proximally with perinephric stranding.   Nonobstructing left-sided renal stones.   Normal appendix.   Portions of the pelvis are obscured by the significant  streak artifact from the bilateral hip arthroplasties.    Following Medications were ordered in ER: Medications  sodium chloride 0.9 % bolus 1,000 mL (0 mLs Intravenous Stopped 02/18/23 1753)  sodium chloride 0.9 % bolus 1,000 mL (0 mLs Intravenous Stopped 02/18/23 1651)    _______________________________________________________ ER Provider Called:    Urology    Dr Laverle Patter They Recommend admit to medicine   Will see on arrival   ED Triage Vitals  Encounter Vitals Group     BP 02/18/23 1513 (!) 116/92     Systolic BP Percentile --      Diastolic BP Percentile --      Pulse Rate 02/18/23 1513 (!) 101     Resp 02/18/23 1513 16     Temp 02/18/23 1513 97.7 F (36.5 C)     Temp Source 02/18/23 1513 Oral     SpO2 02/18/23 1513 97 %     Weight 02/18/23 1512 270 lb (122.5 kg)     Height --      Head Circumference --      Peak Flow --      Pain Score 02/18/23 1510 8     Pain Loc --      Pain Education --      Exclude from Growth Chart --   WUJW(11)@     _________________________________________ Significant initial  Findings: Abnormal Labs Reviewed  URINALYSIS, ROUTINE W REFLEX MICROSCOPIC - Abnormal; Notable for the following components:      Result Value   Color, Urine BROWN (*)    APPearance TURBID (*)    Glucose, UA   (*)    Value: TEST NOT REPORTED DUE TO COLOR INTERFERENCE OF URINE PIGMENT   Hgb urine dipstick   (*)    Value: TEST NOT REPORTED DUE TO COLOR INTERFERENCE OF URINE PIGMENT   Bilirubin Urine   (*)    Value: TEST NOT REPORTED DUE TO COLOR INTERFERENCE OF URINE PIGMENT   Ketones, ur   (*)    Value: TEST NOT REPORTED DUE TO COLOR INTERFERENCE OF URINE PIGMENT   Protein, ur   (*)    Value: TEST NOT REPORTED DUE TO COLOR INTERFERENCE OF URINE PIGMENT   Nitrite   (*)  Value: TEST NOT REPORTED DUE TO COLOR INTERFERENCE OF URINE PIGMENT   Leukocytes,Ua   (*)    Value: TEST NOT REPORTED DUE TO COLOR INTERFERENCE OF URINE PIGMENT   All other components within  normal limits  CBC - Abnormal; Notable for the following components:   WBC 10.8 (*)    All other components within normal limits  COMPREHENSIVE METABOLIC PANEL - Abnormal; Notable for the following components:   CO2 21 (*)    Glucose, Bld 215 (*)    Creatinine, Ser 2.23 (*)    GFR, Estimated 36 (*)    All other components within normal limits  URINALYSIS, MICROSCOPIC (REFLEX) - Abnormal; Notable for the following components:   Bacteria, UA FEW (*)    All other components within normal limits  BASIC METABOLIC PANEL - Abnormal; Notable for the following components:   Glucose, Bld 105 (*)    Creatinine, Ser 2.03 (*)    Calcium 8.7 (*)    GFR, Estimated 41 (*)    All other components within normal limits      _________________________ Troponin  Cardiac Panel (last 3 results) Recent Labs    02/18/23 1530  CKTOTAL 70     ECG: Ordered Personally reviewed and interpreted by me showing: HR : 74 Rhythm:  NSR,  left axis deviation   nonspecific changes,  QTC 466  BNP (last 3 results) Recent Labs    02/03/23 1500  BNP 99.3      The recent clinical data is shown below. Vitals:   02/18/23 1513 02/18/23 1930 02/18/23 1940 02/18/23 2105  BP: (!) 116/92  (!) 141/106 (!) 172/113  Pulse: (!) 101 78 74 75  Resp: 16   20  Temp: 97.7 F (36.5 C)  97.7 F (36.5 C) 98.1 F (36.7 C)  TempSrc: Oral   Oral  SpO2: 97% 97% 97% 99%  Weight:         WBC     Component Value Date/Time   WBC 10.8 (H) 02/18/2023 1525   LYMPHSABS 1.3 06/09/2022 1516   LYMPHSABS 3.8 (H) 03/27/2022 1618   MONOABS 0.4 06/09/2022 1516   EOSABS 0.0 06/09/2022 1516   EOSABS 0.3 03/27/2022 1618   BASOSABS 0.0 06/09/2022 1516   BASOSABS 0.1 03/27/2022 1618      UA   no evidence of UTI     Urine analysis:    Component Value Date/Time   COLORURINE BROWN (A) 02/18/2023 1553   APPEARANCEUR TURBID (A) 02/18/2023 1553   APPEARANCEUR Clear 07/04/2021 1548   LABSPEC  02/18/2023 1553    TEST NOT REPORTED  DUE TO COLOR INTERFERENCE OF URINE PIGMENT   PHURINE  02/18/2023 1553    TEST NOT REPORTED DUE TO COLOR INTERFERENCE OF URINE PIGMENT   GLUCOSEU (A) 02/18/2023 1553    TEST NOT REPORTED DUE TO COLOR INTERFERENCE OF URINE PIGMENT   HGBUR (A) 02/18/2023 1553    TEST NOT REPORTED DUE TO COLOR INTERFERENCE OF URINE PIGMENT   BILIRUBINUR (A) 02/18/2023 1553    TEST NOT REPORTED DUE TO COLOR INTERFERENCE OF URINE PIGMENT   BILIRUBINUR Negative 07/04/2021 1548   KETONESUR (A) 02/18/2023 1553    TEST NOT REPORTED DUE TO COLOR INTERFERENCE OF URINE PIGMENT   PROTEINUR (A) 02/18/2023 1553    TEST NOT REPORTED DUE TO COLOR INTERFERENCE OF URINE PIGMENT   UROBILINOGEN 0.2 12/20/2012 2159   NITRITE (A) 02/18/2023 1553    TEST NOT REPORTED DUE TO COLOR INTERFERENCE OF URINE PIGMENT  LEUKOCYTESUR (A) 02/18/2023 1553    TEST NOT REPORTED DUE TO COLOR INTERFERENCE OF URINE PIGMENT   _______  __________________________________________________________ Recent Labs  Lab 02/18/23 1530 02/18/23 1754 02/18/23 1900  NA 137 141  --   K 3.6 3.5  --   CO2 21* 24  --   GLUCOSE 215* 105*  --   BUN 17 17  --   CREATININE 2.23* 2.03*  --   CALCIUM 9.3 8.7*  --   MG  --   --  1.7  PHOS  --   --  3.4    Cr   Up from baseline see below Lab Results  Component Value Date   CREATININE 2.03 (H) 02/18/2023   CREATININE 2.23 (H) 02/18/2023   CREATININE 1.04 02/03/2023    Recent Labs  Lab 02/18/23 1530 02/18/23 1930  AST 30 21  ALT 39 33  ALKPHOS 67 62  BILITOT 0.7 0.5  PROT 7.3 6.7  ALBUMIN 3.9 3.5   Lab Results  Component Value Date   CALCIUM 8.7 (L) 02/18/2023   PHOS 3.4 02/18/2023     Plt: Lab Results  Component Value Date   PLT 249 02/18/2023     Recent Labs  Lab 02/18/23 1525  WBC 10.8*  HGB 13.5  HCT 40.4  MCV 88.8  PLT 249    HG/HCT  stable,      Component Value Date/Time   HGB 13.5 02/18/2023 1525   HGB 14.0 03/27/2022 1618   HCT 40.4 02/18/2023 1525   HCT 40.3  03/27/2022 1618   MCV 88.8 02/18/2023 1525   MCV 92 03/27/2022 1618   _______________________________________________ Hospitalist was called for admission for  AKI, Ureterolithiasis   The following Work up has been ordered so far:  Orders Placed This Encounter  Procedures   Urine Culture   CT Renal Stone Study   Urinalysis, Routine w reflex microscopic -Urine, Unspecified Source   CBC   Comprehensive metabolic panel   CK   Urinalysis, Microscopic (reflex)   Basic metabolic panel   Hepatic function panel   Magnesium   Phosphorus   TSH   Sodium, urine, random   Creatinine, urine, random   Cardiac Monitoring Continuous x 48 hours Indications for use: Other; Other indications for use: aki   Consult to urology   Consult to hospitalist   EKG 12-Lead   EKG   Place in observation (patient's expected length of stay will be less than 2 midnights)     OTHER Significant initial  Findings:  labs showing:     DM  labs:  HbA1C: Recent Labs    03/27/22 1613 06/30/22 1213 08/28/22 1352  HGBA1C 7.3* 8.4* 7.1*    CBG (last 3)  No results for input(s): "GLUCAP" in the last 72 hours.        Cultures:    Component Value Date/Time   SDES URINE, CLEAN CATCH 12/20/2012 2159   SPECREQUEST NONE 12/20/2012 2159   CULT NO GROWTH 12/20/2012 2159   REPTSTATUS 12/22/2012 FINAL 12/20/2012 2159     Radiological Exams on Admission: CT Renal Stone Study  Result Date: 02/18/2023 CLINICAL DATA:  Blood in urine for 3 days.  Lower abdominal pain EXAM: CT ABDOMEN AND PELVIS WITHOUT CONTRAST TECHNIQUE: Multidetector CT imaging of the abdomen and pelvis was performed following the standard protocol without IV contrast. RADIATION DOSE REDUCTION: This exam was performed according to the departmental dose-optimization program which includes automated exposure control, adjustment of the mA and/or kV according to  patient size and/or use of iterative reconstruction technique. COMPARISON:  None  Available. FINDINGS: Lower chest: Slight linear opacity seen along bases likely scar or atelectasis. No pleural effusion coronary artery calcifications are seen. Coronary artery calcifications are seen. Please correlate for other coronary risk factors. Hepatobiliary: On this non IV contrast exam there is diffuse fatty liver infiltration gallbladder is nondilated. Pancreas: Unremarkable. No pancreatic ductal dilatation or surrounding inflammatory changes. Spleen: Normal in size without focal abnormality. Adrenals/Urinary Tract: Adrenal glands are preserved. There is some perinephric stranding on the right with slight ectasia of the renal collecting system down to the level of the bladder. The low pelvis and bladder obscured by the significant streak artifact from both hip arthroplasties but there appears to be a tiny stone in the very distal right ureter proximal to the UVJ best seen on coronal series 601, image 78 and axial series 301, image 93. There are some separate nonobstructing left-sided renal stones measuring up to 3-4 mm. No left ureteral stone or collecting system dilatation. Grossly preserved contours of the urinary bladder but again obscured. Stomach/Bowel: On this non oral contrast exam, the large bowel has a normal course and caliber with some fluid and stool. Normal appendix extends inferior and medial to the cecum in the right lower quadrant. Moderate debris and fluid in the stomach. Small bowel is nondilated. Vascular/Lymphatic: Aortic atherosclerosis. No enlarged abdominal or pelvic lymph nodes. Reproductive: Obscured by the streak artifact. Other: No free intra-air or free fluid. Musculoskeletal: Degenerative changes seen of the spine and pelvis. In particular along the lower lumbar spine with disc bulging and stenosis. Again bilateral hip arthroplasties with associated streak artifact obscuring the surrounding tissues. IMPRESSION: Punctate stone seen in the extreme distal right ureter just  proximal to the UVJ. Mild collecting system dilatation proximally with perinephric stranding. Nonobstructing left-sided renal stones. Normal appendix. Portions of the pelvis are obscured by the significant streak artifact from the bilateral hip arthroplasties. Electronically Signed   By: Karen Kays M.D.   On: 02/18/2023 16:57   _______________________________________________________________________________________________________ Latest  Blood pressure (!) 172/113, pulse 75, temperature 98.1 F (36.7 C), temperature source Oral, resp. rate 20, weight 122.5 kg, SpO2 99%.   Vitals  labs and radiology finding personally reviewed  Review of Systems:    Pertinent positives include: flank pain No straining to urinate.   Constitutional:  No weight loss, night sweats, Fevers, chills, fatigue, weight loss  HEENT:  No headaches, Difficulty swallowing,Tooth/dental problems,Sore throat,  No sneezing, itching, ear ache, nasal congestion, post nasal drip,  Cardio-vascular:  No chest pain, Orthopnea, PND, anasarca, dizziness, palpitations.no Bilateral lower extremity swelling  GI:  No heartburn, indigestion, abdominal pain, nausea, vomiting, diarrhea, change in bowel habits, loss of appetite, melena, blood in stool, hematemesis Resp:  no shortness of breath at rest. No dyspnea on exertion, No excess mucus, no productive cough, No non-productive cough, No coughing up of blood.No change in color of mucus.No wheezing. Skin:  no rash or lesions. No jaundice GU:  no dysuria, change in color of urine, no urgency or frequency.  Musculoskeletal:  No joint pain or no joint swelling. No decreased range of motion. No back pain.  Psych:  No change in mood or affect. No depression or anxiety. No memory loss.  Neuro: no localizing neurological complaints, no tingling, no weakness, no double vision, no gait abnormality, no slurred speech, no confusion  All systems reviewed and apart from HOPI all are  negative _______________________________________________________________________________________________ Past Medical History:   Past  Medical History:  Diagnosis Date   Arthritis    Cardiomyopathy [facility])    CHF (congestive heart failure) (HCC)    Complication of anesthesia    Woke up during surgery   Coronary artery disease    Diabetes (HCC)    Gout    High cholesterol    Hypertension    Low testosterone 04/17/2021   Malignant neoplasm of thyroid gland (HCC) 04/17/2016   Pneumonia    Postsurgical hypothyroidism    thyroid cancer   Sleep apnea    Vitamin D insufficiency       Past Surgical History:  Procedure Laterality Date   EYE SURGERY Left    cataract removal   FRACTURE SURGERY Left 06/23/1987   ankle   THYROIDECTOMY N/A 03/03/2013   Procedure: TOTAL THYROIDECTOMY;  Surgeon: Dalia Heading, MD;  Location: [facility];  Service: General;  Laterality: N/A;   TOTAL HIP ARTHROPLASTY Right 07/28/2021   TOTAL HIP ARTHROPLASTY Left 09/08/2022   Procedure: TOTAL HIP ARTHROPLASTY ANTERIOR APPROACH;  Surgeon: Durene Romans, MD;  Location: [facility];  Service: Orthopedics;  Laterality: Left;    Social History:  Ambulatory  independently    reports that he has been smoking cigarettes. He has a 5 pack-year smoking history. He has quit using smokeless tobacco. He reports current alcohol use. He reports that he does not use drugs. Family History:  Family History  Problem Relation Age of Onset   Hypertension Father    Hyperlipidemia Father    ______________________________________________________________________________________________ Allergies: Allergies  Allergen Reactions   Isosorbide Other (See Comments)    Low blood pressure     Prior to Admission medications   Medication Sig Start Date End Date Taking? Authorizing Provider  acetaminophen (TYLENOL) 500 MG tablet Take 1,000 mg by mouth every 6 (six) hours as needed for moderate pain.    [provider]  carvedilol  (COREG) 3.125 MG tablet Take 3.125 mg by mouth 2 (two) times daily with a meal. Hold if sbp is less than 105 or hr less than 60    [provider]  colchicine 0.6 MG tablet TAKE 2 TABLETS AT ONSET, THEN 1 TABLET 2 HOURS LATER- A MAXIMUM OF 3 TABLETS IN 24 HOURS Patient taking differently: TAKE 2 TABLETS AT ONSET, THEN 1 TABLET 2 HOURS LATER AS NEEDED FOR GOUT FLARES - A MAXIMUM OF 3 TABLETS IN 24 HOURS 05/21/20   Nida, Denman George, MD  ENTRESTO 97-103 MG Take 1 tablet by mouth 2 (two) times daily. 01/05/22   [provider]  levothyroxine (SYNTHROID) 200 MCG tablet Take 200 mcg by mouth daily before breakfast.    [provider]  levothyroxine (SYNTHROID) 200 MCG tablet Take 1 tablet (200 mcg total) by mouth daily. 01/15/23   Roma Kayser, MD  levothyroxine (SYNTHROID) 25 MCG tablet Take 1 tablet (25 mcg total) by mouth daily before breakfast. 10/02/22   Nida, Denman George, MD  magnesium oxide (MAG-OX) 400 MG tablet Take 400 mg by mouth daily. 01/15/22   [provider]  metFORMIN (GLUCOPHAGE) 1000 MG tablet TAKE 1 TABLET (1,000 MG TOTAL) BY MOUTH TWICE A DAY WITH FOOD 11/25/22   Roma Kayser, MD  methocarbamol (ROBAXIN) 500 MG tablet Take 1 tablet (500 mg total) by mouth every 6 (six) hours as needed for muscle spasms. 09/08/22   Cassandria Anger, PA-C  metoprolol succinate (TOPROL-XL) 100 MG 24 hr tablet Take 1 tablet (100 mg total) by mouth daily after breakfast. 06/30/22  Roma Kayser, MD  mupirocin ointment (BACTROBAN) 2 % Apply 1 Application topically 2 (two) times daily as needed (irritation/infection).    [provider]  nystatin (MYCOSTATIN/NYSTOP) powder Apply 1 Application topically 2 (two) times daily as needed (irritation).    [provider]  nystatin cream (MYCOSTATIN) Apply 1 Application topically 2 (two) times daily as needed (irritation).    [provider]  omega-3 acid ethyl esters (LOVAZA) 1  g capsule TAKE 2 CAPSULES BY MOUTH TWICE A DAY 01/12/23   Sonny Masters, FNP  oxyCODONE (ROXICODONE) 5 MG immediate release tablet Take 1 tablet (5 mg total) by mouth every 4 (four) hours as needed for severe pain. 09/08/22   Cassandria Anger, PA-C  polyethylene glycol (MIRALAX / GLYCOLAX) 17 g packet Take 17 g by mouth 2 (two) times daily. 09/08/22   Cassandria Anger, PA-C  pravastatin (PRAVACHOL) 40 MG tablet TAKE 1 TABLET BY MOUTH EVERY DAY Patient taking differently: Take 40 mg by mouth at bedtime. 06/23/22   Daryll Drown, NP  TURMERIC CURCUMIN PO Take 1,000 mg by mouth 2 (two) times daily.    [provider]  valACYclovir (VALTREX) 1000 MG tablet Take 1,000-2,000 mg by mouth See admin instructions. Take 2000 mg at onset of fever blisters, then take 1000 mg daily as needed    [provider]  Vitamin D, Ergocalciferol, (DRISDOL) 1.25 MG (50000 UNIT) CAPS capsule Take 50,000 Units by mouth every Sunday.    [provider]    ___________________________________________________________________________________________________ Physical Exam:    02/18/2023    9:05 PM 02/18/2023    7:40 PM 02/18/2023    7:30 PM  Vitals with BMI  Systolic 172 141   Diastolic 113 106   Pulse 75 74 78     1. General:  in No Acute distress  Chronically ill -appearing 2. Psychological: Alert and  Oriented 3. Head/ENT:   Dry Mucous Membranes                          Head Non traumatic, neck supple                          Poor Dentition 4. SKIN: decreased Skin turgor,  Skin clean Dry and intact no rash    5. Heart: Regular rate and rhythm no Murmur, no Rub or gallop 6. Lungs:no wheezes or crackles   7. Abdomen: Soft, non-tender, Non distended  obese bowel sounds present 8. Lower extremities: no clubbing, cyanosis, no edema 9. Neurologically Grossly intact, moving all 4 extremities equally 10. MSK: Normal range of motion    Chart has been  reviewed  ______________________________________________________________________________________________  Assessment/Plan 47 y.o. male with medical history significant of cardiomyopathy, CAD, diabetes, gout, hypertension sleep apnea  Admitted for   AKI   Ureterolithiasis      Present on Admission:  AKI (acute kidney injury) (HCC)  Tobacco abuse  Postsurgical hypothyroidism  Essential hypertension  Type 2 diabetes mellitus with hyperglycemia, without long-term current use of insulin (HCC)  Chronic combined systolic and diastolic CHF (congestive heart failure) (HCC)  Morbid obesity (HCC)  Ureteral stone     AKI (acute kidney injury) (HCC) In the setting of dehydration will rehydrate and follow renal function Although there is a renal stone present it is very small and unlikely to cause any obstruction appreciate urology consult Obtained electrolytes  Tobacco abuse  - Spoke about importance  of quitting spent 5 minutes discussing options for treatment, prior attempts at quitting, and dangers of smoking  -At this point patient is    interested in quitting  - pt declined nicotine patch   - nursing tobacco cessation protocol   Postsurgical hypothyroidism - Check TSH continue home medications Synthroid at po q day   Essential hypertension Allow permissive hypertension for tonight  Type 2 diabetes mellitus with hyperglycemia, without long-term current use of insulin (HCC) Order sliding scale hold p.o. medications  Chronic combined systolic and diastolic CHF (congestive heart failure) (HCC) - currently appears to be slightly on the dry side,  , carefuly follow fluid status and Cr Hold Entresto given AKI   Morbid obesity (HCC) Follow-up as an outpatient with nutrition  Ureteral stone Urology is aware. Have seen in consult.  N.p.o. postmidnight in case needs procedures done    Other plan as per orders.  DVT prophylaxis:  SCD     Code Status:    Code Status:  Prior FULL CODE   as per patient   I had personally discussed CODE STATUS with patient   ACP   none    Family Communication:   Family  at  Bedside  plan of care was discussed  with  Wife,   Diet  npo post midnihgt   Disposition Plan:     Following barriers for discharge:                            AKI corrected                                                           Pain controlled with PO medications                                                          Will need consultants to evaluate patient prior to discharge       Consult Orders  (From admission, onward)           Start     Ordered   02/18/23 1819  Consult to hospitalist  Called [facility] for consult with hospitalist  spoke with Festus Barren  Once       Provider:  (Not yet assigned)  Question Answer Comment  Place call to: [facility]   Reason for Consult Admit      02/18/23 1818                               Consults called:     Treatment Team:  Heloise Purpura, MD  Admission status:  ED Disposition     ED Disposition  Admit   Condition  --   Comment  Hospital Area: [facility] [[id number]]  Level of Care: Telemetry [5]  Admit to tele based on following criteria: Monitor QTC interval  Interfacility transfer: Yes  May place patient in observation at [facility] or [facility] if equivalent level of care is available:: No  Covid Evaluation: Asymptomatic - no recent  exposure (last 10 days) testing not required  Diagnosis: AKI (acute kidney injury) [facility] [[id number]]  Admitting Physician: Therisa Doyne [[id number]]  Attending Physician: Therisa Doyne [[id number]]          Obs    Level of care     tele  For   24H          Aleza Pew 02/18/2023, 11:52 PM     [facility]     after 2 AM please page floor coverage PA If 7AM-7PM, please contact the day team taking care of the patient using [email or web address]

## 2023-02-18 NOTE — ED Notes (Signed)
Pt transported to imaging.

## 2023-02-18 NOTE — Assessment & Plan Note (Signed)
-   Spoke about importance of quitting spent 5 minutes discussing options for treatment, prior attempts at quitting, and dangers of smoking  -At this point patient is    interested in quitting  - pt declined nicotine patch   - nursing tobacco cessation protocol

## 2023-02-18 NOTE — ED Provider Notes (Signed)
[place] [facility]ERGENCY DEPARTMENT AT [facility] Provider Note   CSN: [id number] Arrival date & time: 02/18/23  1507     History  Chief Complaint  Patient presents with   Hematuria    Tyler [name] is a 47 y.o. male.   Hematuria   47 year old male presents emergency department with complaints of right-sided flank pain, hematuria.  Patient reports symptoms beginning 3 days ago.  Reports beginning with right-sided flank pain with some radiation to right inguinal region.  States the pain has been intermittent since onset but still with some residual dull right sided back pain in between episodes of exacerbations.  Reports noticing darker colored urine intermittently over the past 3 days but states he has been drinking minimally due to being "on the job" and not having liquids near him.  Denies any dysuria, urinary frequency/urgency, fever, chills, nausea, vomiting.  Denies history of similar symptoms in the past.  Past medical history significant for CHF, cardiomyopathy, CAD, diabetes, gout, hypercholesterolemia, hypertension,  Home Medications Prior to Admission medications   Medication Sig Start Date End Date Taking? Authorizing Provider  acetaminophen (TYLENOL) 500 MG tablet Take 1,000 mg by mouth every 6 (six) hours as needed for moderate pain.    [provider]  carvedilol (COREG) 3.125 MG tablet Take 3.125 mg by mouth 2 (two) times daily with a meal. Hold if sbp is less than 105 or hr less than 60    [provider]  colchicine 0.6 MG tablet TAKE 2 TABLETS AT ONSET, THEN 1 TABLET 2 HOURS LATER- A MAXIMUM OF 3 TABLETS IN 24 HOURS Patient taking differently: TAKE 2 TABLETS AT ONSET, THEN 1 TABLET 2 HOURS LATER AS NEEDED FOR GOUT FLARES - A MAXIMUM OF 3 TABLETS IN 24 HOURS 05/21/20   Nida, Denman George, MD  ENTRESTO 97-103 MG Take 1 tablet by mouth 2 (two) times daily. 01/05/22   [provider]  levothyroxine (SYNTHROID) 200 MCG tablet Take 200  mcg by mouth daily before breakfast.    [provider]  levothyroxine (SYNTHROID) 200 MCG tablet Take 1 tablet (200 mcg total) by mouth daily. 01/15/23   Roma Kayser, MD  levothyroxine (SYNTHROID) 25 MCG tablet Take 1 tablet (25 mcg total) by mouth daily before breakfast. 10/02/22   Nida, Denman George, MD  magnesium oxide (MAG-OX) 400 MG tablet Take 400 mg by mouth daily. 01/15/22   [provider]  metFORMIN (GLUCOPHAGE) 1000 MG tablet TAKE 1 TABLET (1,000 MG TOTAL) BY MOUTH TWICE A DAY WITH FOOD 11/25/22   Roma Kayser, MD  methocarbamol (ROBAXIN) 500 MG tablet Take 1 tablet (500 mg total) by mouth every 6 (six) hours as needed for muscle spasms. 09/08/22   Cassandria Anger, PA-C  metoprolol succinate (TOPROL-XL) 100 MG 24 hr tablet Take 1 tablet (100 mg total) by mouth daily after breakfast. 06/30/22   Nida, Denman George, MD  mupirocin ointment (BACTROBAN) 2 % Apply 1 Application topically 2 (two) times daily as needed (irritation/infection).    [provider]  nystatin (MYCOSTATIN/NYSTOP) powder Apply 1 Application topically 2 (two) times daily as needed (irritation).    [provider]  nystatin cream (MYCOSTATIN) Apply 1 Application topically 2 (two) times daily as needed (irritation).    [provider]  omega-3 acid ethyl esters (LOVAZA) 1 g capsule TAKE 2 CAPSULES BY MOUTH TWICE A DAY 01/12/23   Sonny Masters, FNP  oxyCODONE (ROXICODONE) 5 MG immediate release tablet Take 1 tablet (  5 mg total) by mouth every 4 (four) hours as needed for severe pain. 09/08/22   Cassandria Anger, PA-C  polyethylene glycol (MIRALAX / GLYCOLAX) 17 g packet Take 17 g by mouth 2 (two) times daily. 09/08/22   Cassandria Anger, PA-C  pravastatin (PRAVACHOL) 40 MG tablet TAKE 1 TABLET BY MOUTH EVERY DAY Patient taking differently: Take 40 mg by mouth at bedtime. 06/23/22   Daryll Drown, NP  TURMERIC CURCUMIN PO Take 1,000 mg by mouth 2 (two) times  daily.    [provider]  valACYclovir (VALTREX) 1000 MG tablet Take 1,000-2,000 mg by mouth See admin instructions. Take 2000 mg at onset of fever blisters, then take 1000 mg daily as needed    [provider]  Vitamin D, Ergocalciferol, (DRISDOL) 1.25 MG (50000 UNIT) CAPS capsule Take 50,000 Units by mouth every Sunday.    [provider]      Allergies    Isosorbide    Review of Systems   Review of Systems  Genitourinary:  Positive for hematuria.  All other systems reviewed and are negative.   Physical Exam Updated Vital Signs BP (!) 116/92 (BP Location: Left Arm)   Pulse (!) 101   Temp 97.7 F (36.5 C) (Oral)   Resp 16   Wt 122.5 kg   SpO2 97%   BMI 38.74 kg/m  Physical Exam Vitals and nursing note reviewed.  Constitutional:      General: He is not in acute distress.    Appearance: He is well-developed.  HENT:     Head: Normocephalic and atraumatic.  Eyes:     Conjunctiva/sclera: Conjunctivae normal.  Cardiovascular:     Rate and Rhythm: Normal rate and regular rhythm.     Heart sounds: No murmur heard. Pulmonary:     Effort: Pulmonary effort is normal. No respiratory distress.     Breath sounds: Normal breath sounds. No wheezing, rhonchi or rales.  Abdominal:     Palpations: Abdomen is soft.     Tenderness: There is no abdominal tenderness. There is right CVA tenderness. There is no guarding.  Musculoskeletal:        General: No swelling.     Cervical back: Neck supple.     Right lower leg: No edema.     Left lower leg: No edema.  Skin:    General: Skin is warm and dry.     Capillary Refill: Capillary refill takes less than 2 seconds.  Neurological:     Mental Status: He is alert.  Psychiatric:        Mood and Affect: Mood normal.     ED Results / Procedures / Treatments   Labs (all labs ordered are listed, but only abnormal results are displayed) Labs Reviewed  URINALYSIS, ROUTINE W REFLEX MICROSCOPIC - Abnormal; Notable  for the following components:      Result Value   Color, Urine BROWN (*)    APPearance TURBID (*)    Glucose, UA   (*)    Value: TEST NOT REPORTED DUE TO COLOR INTERFERENCE OF URINE PIGMENT   Hgb urine dipstick   (*)    Value: TEST NOT REPORTED DUE TO COLOR INTERFERENCE OF URINE PIGMENT   Bilirubin Urine   (*)    Value: TEST NOT REPORTED DUE TO COLOR INTERFERENCE OF URINE PIGMENT   Ketones, ur   (*)    Value: TEST NOT REPORTED DUE TO COLOR INTERFERENCE OF URINE PIGMENT   Protein, ur   (*)  Value: TEST NOT REPORTED DUE TO COLOR INTERFERENCE OF URINE PIGMENT   Nitrite   (*)    Value: TEST NOT REPORTED DUE TO COLOR INTERFERENCE OF URINE PIGMENT   Leukocytes,Ua   (*)    Value: TEST NOT REPORTED DUE TO COLOR INTERFERENCE OF URINE PIGMENT   All other components within normal limits  CBC - Abnormal; Notable for the following components:   WBC 10.8 (*)    All other components within normal limits  COMPREHENSIVE METABOLIC PANEL - Abnormal; Notable for the following components:   CO2 21 (*)    Glucose, Bld 215 (*)    Creatinine, Ser 2.23 (*)    GFR, Estimated 36 (*)    All other components within normal limits  URINALYSIS, MICROSCOPIC (REFLEX) - Abnormal; Notable for the following components:   Bacteria, UA FEW (*)    All other components within normal limits  BASIC METABOLIC PANEL - Abnormal; Notable for the following components:   Glucose, Bld 105 (*)    Creatinine, Ser 2.03 (*)    Calcium 8.7 (*)    GFR, Estimated 41 (*)    All other components within normal limits  URINE CULTURE  CK  HEPATIC FUNCTION PANEL  URINALYSIS, ROUTINE W REFLEX MICROSCOPIC  MAGNESIUM  PHOSPHORUS  TSH  SODIUM, URINE, RANDOM  CREATININE, URINE, RANDOM    EKG None  Radiology CT Renal Stone Study  Result Date: 02/18/2023 CLINICAL DATA:  Blood in urine for 3 days.  Lower abdominal pain EXAM: CT ABDOMEN AND PELVIS WITHOUT CONTRAST TECHNIQUE: Multidetector CT imaging of the abdomen and pelvis was  performed following the standard protocol without IV contrast. RADIATION DOSE REDUCTION: This exam was performed according to the departmental dose-optimization program which includes automated exposure control, adjustment of the mA and/or kV according to patient size and/or use of iterative reconstruction technique. COMPARISON:  None Available. FINDINGS: Lower chest: Slight linear opacity seen along bases likely scar or atelectasis. No pleural effusion coronary artery calcifications are seen. Coronary artery calcifications are seen. Please correlate for other coronary risk factors. Hepatobiliary: On this non IV contrast exam there is diffuse fatty liver infiltration gallbladder is nondilated. Pancreas: Unremarkable. No pancreatic ductal dilatation or surrounding inflammatory changes. Spleen: Normal in size without focal abnormality. Adrenals/Urinary Tract: Adrenal glands are preserved. There is some perinephric stranding on the right with slight ectasia of the renal collecting system down to the level of the bladder. The low pelvis and bladder obscured by the significant streak artifact from both hip arthroplasties but there appears to be a tiny stone in the very distal right ureter proximal to the UVJ best seen on coronal series 601, image 78 and axial series 301, image 93. There are some separate nonobstructing left-sided renal stones measuring up to 3-4 mm. No left ureteral stone or collecting system dilatation. Grossly preserved contours of the urinary bladder but again obscured. Stomach/Bowel: On this non oral contrast exam, the large bowel has a normal course and caliber with some fluid and stool. Normal appendix extends inferior and medial to the cecum in the right lower quadrant. Moderate debris and fluid in the stomach. Small bowel is nondilated. Vascular/Lymphatic: Aortic atherosclerosis. No enlarged abdominal or pelvic lymph nodes. Reproductive: Obscured by the streak artifact. Other: No free intra-air  or free fluid. Musculoskeletal: Degenerative changes seen of the spine and pelvis. In particular along the lower lumbar spine with disc bulging and stenosis. Again bilateral hip arthroplasties with associated streak artifact obscuring the surrounding tissues. IMPRESSION: Punctate stone  seen in the extreme distal right ureter just proximal to the UVJ. Mild collecting system dilatation proximally with perinephric stranding. Nonobstructing left-sided renal stones. Normal appendix. Portions of the pelvis are obscured by the significant streak artifact from the bilateral hip arthroplasties. Electronically Signed   By: Karen Kays M.D.   On: 02/18/2023 16:57    Procedures Procedures    Medications Ordered in ED Medications  sodium chloride 0.9 % bolus 1,000 mL (0 mLs Intravenous Stopped 02/18/23 1753)  sodium chloride 0.9 % bolus 1,000 mL (0 mLs Intravenous Stopped 02/18/23 1651)    ED Course/ Medical Decision Making/ A&P Clinical Course as of 02/18/23 1902  Thu Feb 18, 2023  1801 Orlene Erm was had with patient regarding admission given evidence of AKI in the setting of dehydration and kidney stone.  He elected for repeat assessment of BMP after IV fluids were given and making decisions thereafter. [CR]  1823 Consulted Dr. Laverle Patter of urology who recommended admission to [facility] for monitoring of patient's renal function.  No intervention at this time. [CR]  1857 Consulted Dr. Adela Glimpse of hospitalist who agreed with admission and assume further treatment/care [CR]    Clinical Course User Index [CR] Peter Garter, PA                                 Medical Decision Making Amount and/or Complexity of Data Reviewed Labs: ordered. Radiology: ordered.  Risk Prescription drug management. Decision regarding hospitalization.   This patient presents to the ED for concern of flank pain, this involves an extensive number of treatment options, and is a complaint that carries with it a high  risk of complications and morbidity.  The differential diagnosis includes pyelonephritis, nephrolithiasis, diverticulitis, appendicitis, cystitis, SBO/LBO, volvulus, musculoskeletal   Co morbidities that complicate the patient evaluation  See HPI   Additional history obtained:  Additional history obtained from EMR External records from outside source obtained and reviewed including hospital records   Lab Tests:  I Ordered, and personally interpreted labs.  The pertinent results include: Leukocytosis of 10.8.  No evidence of anemia.  Placed within range.  Mild decrease in bicarb of 21 but otherwise can electrolytes within normal limits.  Doubling of creatinine of 2.23 at baseline around 1.  No transaminitis.  CK within normal limits.  UA difficult to assess due to brown turbid appearance but with 6-10 squamous epithelial cells, few bacteria, 6-10 WBCs.   Imaging Studies ordered:  I ordered imaging studies including CT renal stone study I independently visualized and interpreted imaging which showed punctate stone in distal right ureter just proximal to UVJ.  Mild collecting system dilation proximally with perinephric stranding.  Nonobstructing left-sided renal stones.  Normal appendix. I agree with the radiologist interpretation  Cardiac Monitoring: / EKG:  The patient was maintained on a cardiac monitor.  I personally viewed and interpreted the cardiac monitored which showed an underlying rhythm of: Sinus rhythm   Consultations Obtained:  See ED course  Problem List / ED Course / Critical interventions / Medication management  Right flank pain, ureterolithiasis, AKI I ordered medication including 1600 cc normal saline   Reevaluation of the patient after these medicines showed that the patient improved I have reviewed the patients home medicines and have made adjustments as needed   Social Determinants of Health:  Chronic cigarette use.  Denies illicit drug use.   Test /  Admission - Considered:  Right  flank pain, ureterolithiasis, AKI Vitals signs significant for initial tachycardia with a heart rate of 101. Otherwise within normal range and stable throughout visit. Laboratory/imaging studies significant for: See above 47 year old male presents emergency department with complaints of right-sided flank pain.  On exam, patient with reproducible right-sided CVA tenderness.  Given acute onset nature right-sided flank pain, colicky nature with no prior history of kidney stones, laboratory imaging studies were performed.  CT imaging did show punctate distal right-sided kidney stone and ureter.  Patient's laboratory studies significant for AKI with doubling of creatinine from about 1.04-2.23.  This could be secondary to kidney stone but given punctate nature and overall well-appearing left kidney, more likely secondary to dehydration.  Per patient, he has had nothing to drink today and does drink minimally when "on the job."  Patient works as a [employer] architect and has had difficulty maintaining hydration during long workdays.  Consulted urology regarding the patient who recommended admission to [facility] for monitoring patient's renal function with no current intervention at this time.  Consult to the hospitalist who agreed with admission and assume further treatment/care.  Treatment plan discussed at length with patient and he acknowledged understanding was agreeable to said plan.  Patient overall well-appearing, afebrile in no acute distress.        Final Clinical Impression(s) / ED Diagnoses Final diagnoses:  AKI (acute kidney injury) (HCC)  Flank pain  Ureterolithiasis    Rx / DC Orders ED Discharge Orders     None         Peter Garter, [place] 02/18/23 Lenon Oms, MD 03/04/23 [phone]

## 2023-02-18 NOTE — Assessment & Plan Note (Addendum)
In the setting of dehydration will rehydrate and follow renal function Although there is a renal stone present it is very small and unlikely to cause any obstruction appreciate urology consult Obtained electrolytes

## 2023-02-18 NOTE — Assessment & Plan Note (Signed)
-   Check TSH continue home medications Synthroid at po q day

## 2023-02-18 NOTE — Assessment & Plan Note (Signed)
Order sliding scale hold p.o. medications 

## 2023-02-18 NOTE — Assessment & Plan Note (Signed)
Allow permissive hypertension for tonight 

## 2023-02-18 NOTE — ED Triage Notes (Signed)
Pt reports having blood in his urine x 3 days. Lower abdominal pain.

## 2023-02-18 NOTE — ED Notes (Signed)
[facility] reached out for transport @ 19:38  No ETA currently...Marland KitchenMarland Kitchen

## 2023-02-18 NOTE — ED Notes (Signed)
PIV placement and bloodwork collection attempted, unsuccessful.  [place] IV placement requested.

## 2023-02-18 NOTE — Subjective & Objective (Addendum)
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 intake only drank very little bit of water notices urine has been turning dark in color and also noted his pain continued.  Presented today Pain started in the right flank with radiation to the right inguinal region.  Intermittent but overall mild he was able to stay on a job no dysuria no fevers no chills no nausea no vomiting he was seen in the emergency department about 2 weeks ago with what felt to be a viral illness. Patient does has history of cardiomyopathy followed by cardiology

## 2023-02-19 DIAGNOSIS — Z8249 Family history of ischemic heart disease and other diseases of the circulatory system: Secondary | ICD-10-CM | POA: Diagnosis not present

## 2023-02-19 DIAGNOSIS — E291 Testicular hypofunction: Secondary | ICD-10-CM | POA: Diagnosis present

## 2023-02-19 DIAGNOSIS — I10 Essential (primary) hypertension: Secondary | ICD-10-CM | POA: Diagnosis not present

## 2023-02-19 DIAGNOSIS — I110 Hypertensive heart disease with heart failure: Secondary | ICD-10-CM | POA: Diagnosis present

## 2023-02-19 DIAGNOSIS — N179 Acute kidney failure, unspecified: Secondary | ICD-10-CM | POA: Diagnosis present

## 2023-02-19 DIAGNOSIS — Z83438 Family history of other disorder of lipoprotein metabolism and other lipidemia: Secondary | ICD-10-CM | POA: Diagnosis not present

## 2023-02-19 DIAGNOSIS — I428 Other cardiomyopathies: Secondary | ICD-10-CM

## 2023-02-19 DIAGNOSIS — Z96643 Presence of artificial hip joint, bilateral: Secondary | ICD-10-CM | POA: Diagnosis present

## 2023-02-19 DIAGNOSIS — M109 Gout, unspecified: Secondary | ICD-10-CM | POA: Diagnosis present

## 2023-02-19 DIAGNOSIS — I2510 Atherosclerotic heart disease of native coronary artery without angina pectoris: Secondary | ICD-10-CM | POA: Diagnosis present

## 2023-02-19 DIAGNOSIS — N132 Hydronephrosis with renal and ureteral calculous obstruction: Secondary | ICD-10-CM | POA: Diagnosis present

## 2023-02-19 DIAGNOSIS — Z85850 Personal history of malignant neoplasm of thyroid: Secondary | ICD-10-CM | POA: Diagnosis not present

## 2023-02-19 DIAGNOSIS — F17210 Nicotine dependence, cigarettes, uncomplicated: Secondary | ICD-10-CM | POA: Diagnosis present

## 2023-02-19 DIAGNOSIS — Z7982 Long term (current) use of aspirin: Secondary | ICD-10-CM | POA: Diagnosis not present

## 2023-02-19 DIAGNOSIS — E7800 Pure hypercholesterolemia, unspecified: Secondary | ICD-10-CM | POA: Diagnosis present

## 2023-02-19 DIAGNOSIS — E1165 Type 2 diabetes mellitus with hyperglycemia: Secondary | ICD-10-CM | POA: Diagnosis present

## 2023-02-19 DIAGNOSIS — Z79890 Hormone replacement therapy (postmenopausal): Secondary | ICD-10-CM | POA: Diagnosis not present

## 2023-02-19 DIAGNOSIS — E890 Postprocedural hypothyroidism: Secondary | ICD-10-CM | POA: Diagnosis present

## 2023-02-19 DIAGNOSIS — E860 Dehydration: Secondary | ICD-10-CM | POA: Diagnosis present

## 2023-02-19 DIAGNOSIS — I5042 Chronic combined systolic (congestive) and diastolic (congestive) heart failure: Secondary | ICD-10-CM | POA: Diagnosis present

## 2023-02-19 DIAGNOSIS — E876 Hypokalemia: Secondary | ICD-10-CM | POA: Diagnosis present

## 2023-02-19 DIAGNOSIS — Z888 Allergy status to other drugs, medicaments and biological substances status: Secondary | ICD-10-CM | POA: Diagnosis not present

## 2023-02-19 DIAGNOSIS — Z7984 Long term (current) use of oral hypoglycemic drugs: Secondary | ICD-10-CM | POA: Diagnosis not present

## 2023-02-19 DIAGNOSIS — Z6841 Body Mass Index (BMI) 40.0 and over, adult: Secondary | ICD-10-CM | POA: Diagnosis not present

## 2023-02-19 LAB — HEMOGLOBIN A1C
Hgb A1c MFr Bld: 7.2 % — ABNORMAL HIGH (ref 4.8–5.6)
Mean Plasma Glucose: 159.94 mg/dL

## 2023-02-19 LAB — CBC
HCT: 38.8 % — ABNORMAL LOW (ref 39.0–52.0)
Hemoglobin: 12.9 g/dL — ABNORMAL LOW (ref 13.0–17.0)
MCH: 30.6 pg (ref 26.0–34.0)
MCHC: 33.2 g/dL (ref 30.0–36.0)
MCV: 91.9 fL (ref 80.0–100.0)
Platelets: 203 10*3/uL (ref 150–400)
RBC: 4.22 MIL/uL (ref 4.22–5.81)
RDW: 13.5 % (ref 11.5–15.5)
WBC: 11.5 10*3/uL — ABNORMAL HIGH (ref 4.0–10.5)
nRBC: 0 % (ref 0.0–0.2)

## 2023-02-19 LAB — PHOSPHORUS: Phosphorus: 3.9 mg/dL (ref 2.5–4.6)

## 2023-02-19 LAB — COMPREHENSIVE METABOLIC PANEL
ALT: 31 U/L (ref 0–44)
AST: 19 U/L (ref 15–41)
Albumin: 3.3 g/dL — ABNORMAL LOW (ref 3.5–5.0)
Alkaline Phosphatase: 64 U/L (ref 38–126)
Anion gap: 9 (ref 5–15)
BUN: 16 mg/dL (ref 6–20)
CO2: 20 mmol/L — ABNORMAL LOW (ref 22–32)
Calcium: 8.3 mg/dL — ABNORMAL LOW (ref 8.9–10.3)
Chloride: 108 mmol/L (ref 98–111)
Creatinine, Ser: 1.84 mg/dL — ABNORMAL HIGH (ref 0.61–1.24)
GFR, Estimated: 46 mL/min — ABNORMAL LOW (ref >60)
Glucose, Bld: 155 mg/dL — ABNORMAL HIGH (ref 70–99)
Potassium: 3.4 mmol/L — ABNORMAL LOW (ref 3.5–5.1)
Sodium: 137 mmol/L (ref 135–145)
Total Bilirubin: 0.6 mg/dL (ref 0.3–1.2)
Total Protein: 6.4 g/dL — ABNORMAL LOW (ref 6.5–8.1)

## 2023-02-19 LAB — GLUCOSE, CAPILLARY
Glucose-Capillary: 110 mg/dL — ABNORMAL HIGH (ref 70–99)
Glucose-Capillary: 115 mg/dL — ABNORMAL HIGH (ref 70–99)
Glucose-Capillary: 126 mg/dL — ABNORMAL HIGH (ref 70–99)
Glucose-Capillary: 137 mg/dL — ABNORMAL HIGH (ref 70–99)
Glucose-Capillary: 158 mg/dL — ABNORMAL HIGH (ref 70–99)
Glucose-Capillary: 159 mg/dL — ABNORMAL HIGH (ref 70–99)

## 2023-02-19 LAB — URINE CULTURE: Culture: NO GROWTH

## 2023-02-19 LAB — TSH: TSH: 4.391 u[IU]/mL (ref 0.350–4.500)

## 2023-02-19 LAB — HIV ANTIBODY (ROUTINE TESTING W REFLEX): HIV Screen 4th Generation wRfx: NONREACTIVE

## 2023-02-19 LAB — SODIUM, URINE, RANDOM: Sodium, Ur: 105 mmol/L

## 2023-02-19 LAB — MAGNESIUM: Magnesium: 2 mg/dL (ref 1.7–2.4)

## 2023-02-19 LAB — CREATININE, URINE, RANDOM: Creatinine, Urine: 200 mg/dL

## 2023-02-19 MED ORDER — CARVEDILOL 3.125 MG PO TABS: 3.1250 mg | ORAL_TABLET | Freq: Two times a day (BID) | ORAL | Status: DC

## 2023-02-19 MED ORDER — TAMSULOSIN HCL 0.4 MG PO CAPS
0.4000 mg | ORAL_CAPSULE | Freq: Every day | ORAL | Status: DC
  Administered 2023-02-19 – 2023-02-20 (×2): 0.4 mg via ORAL
  Filled 2023-02-19 (×2): qty 1

## 2023-02-19 MED ORDER — ASPIRIN 81 MG PO TBEC
81.0000 mg | DELAYED_RELEASE_TABLET | Freq: Every day | ORAL | Status: DC
  Administered 2023-02-19: 81 mg via ORAL
  Filled 2023-02-19: qty 1

## 2023-02-19 MED ORDER — POTASSIUM CHLORIDE CRYS ER 20 MEQ PO TBCR
40.0000 meq | EXTENDED_RELEASE_TABLET | ORAL | Status: AC
  Administered 2023-02-19 (×2): 40 meq via ORAL
  Filled 2023-02-19 (×2): qty 2

## 2023-02-19 MED ORDER — SODIUM CHLORIDE 0.9 % IV SOLN: INTRAVENOUS | Status: DC

## 2023-02-19 MED ORDER — CARVEDILOL 6.25 MG PO TABS
6.2500 mg | ORAL_TABLET | Freq: Two times a day (BID) | ORAL | Status: DC
  Administered 2023-02-19: 6.25 mg via ORAL
  Filled 2023-02-19: qty 1

## 2023-02-19 MED ORDER — METHOCARBAMOL 500 MG PO TABS: 500.0000 mg | ORAL_TABLET | Freq: Four times a day (QID) | ORAL | Status: DC | PRN

## 2023-02-19 MED ORDER — LABETALOL HCL 5 MG/ML IV SOLN
10.0000 mg | INTRAVENOUS | Status: DC | PRN
  Administered 2023-02-19 – 2023-02-20 (×2): 10 mg via INTRAVENOUS
  Filled 2023-02-19 (×2): qty 4

## 2023-02-19 NOTE — TOC CM/SW Note (Signed)
Transition of Care [facility]) - Inpatient Brief Assessment   Patient Details  Name: VESTA HENDRIKS MRN: [id number] Date of Birth: 12/26/1975  Transition of Care [facility]) CM/SW Contact:    Howell Rucks, RN Phone Number: 02/19/2023, 11:39 AM   Clinical Narrative: Met with pt at bedside to introduce role of TOC/NCM and review for dc planning, pt reports he has a PCP and pharmacy in place, no home care services or home DME, pt reports she feels safe returning home with good support, reports she has transportation available at discharge. TOC Brief Assessment completed. No TOC needs identified.     Transition of Care Asessment: Insurance and Status: Insurance coverage has been reviewed Patient has primary care physician: Yes Home environment has been reviewed: private residence with good support Prior level of function:: Independent Prior/Current Home Services: No current home services   Readmission risk has been reviewed: Yes Transition of care needs: no transition of care needs at this time

## 2023-02-19 NOTE — Progress Notes (Addendum)
PROGRESS NOTE  Tyler [name] YSA:[id number] DOB: Nov 08, 1975   PCP: Sonny Masters, FNP  Patient is from: Home.  Independently ambulates at baseline.  DOA: 02/18/2023 LOS: 0  Chief complaints Chief Complaint  Patient presents with   Hematuria     Brief Narrative / Interim history: 47 year old M with PMH of combined CHF/NICM, CAD, DM-2, morbid obesity, gout and left THA presenting with right flank pain radiating to right inguinal region and decreased p.o. intake 4 days and admitted for AKI and right ureteral stone. Cr 2.23 (baseline 0.8-1).  CT renal stone study showed punctate stone in the extreme distal right ureter just proximal to the UVJ and mild collecting system dilation proximally with perinephric stranding.  Urology consulted and recommended conservative management as long as AKI improves.   Subjective: Seen and examined earlier this morning.  No major events overnight of this morning.  Felt some abdominal pain earlier this morning.  Also reports dark urine but improved.  Denies dysuria, frank hematuria or other urinary symptoms.  Objective: Vitals:   02/18/23 2105 02/19/23 0100 02/19/23 0505 02/19/23 0859  BP: (!) 172/113 (!) 166/111 (!) 129/94 (!) 145/112  Pulse: 75 83 81 74  Resp: 20 18 16 20   Temp: 98.1 F (36.7 C) 98 F (36.7 C) 98.3 F (36.8 C) 97.6 F (36.4 C)  TempSrc: Oral Oral Oral Oral  SpO2: 99% 100% 97% 98%  Weight: 127.1 kg     Height: 5\' 10"  (1.778 m)       Examination:  GENERAL: No apparent distress.  Nontoxic. HEENT: MMM.  Vision and hearing grossly intact.  NECK: Supple.  No apparent JVD.  RESP:  No IWOB.  Fair aeration bilaterally. CVS:  RRR. Heart sounds normal.  ABD/GI/GU: BS+. Abd soft, NTND.  No CVA tenderness. MSK/EXT:  Moves extremities. No apparent deformity. No edema.  SKIN: no apparent skin lesion or wound NEURO: Awake, alert and oriented appropriately.  No apparent focal neuro deficit. PSYCH: Calm. Normal affect.   Procedures:   None  Microbiology summarized: Urine culture pending  Assessment and plan: Principal Problem:   AKI (acute kidney injury) (HCC) Active Problems:   Tobacco abuse   Postsurgical hypothyroidism   Essential hypertension   Type 2 diabetes mellitus with hyperglycemia, without long-term current use of insulin (HCC)   Morbid obesity (HCC)   Chronic combined systolic and diastolic CHF (congestive heart failure) (HCC)   Ureteral stone  AKI: Baseline Cr ~0.8-1.0.  Likely due to poor p.o. intake, concurrent use of Entresto, Mobic and Celebrex (and obstructive uropathy from kidney stone.  Improving. Recent Labs    03/27/22 1618 04/23/22 0821 06/09/22 1516 08/28/22 1352 09/25/22 1214 02/03/23 1459 02/18/23 1530 02/18/23 1754 02/19/23 0359  BUN 9 11 9 10 11 8 17 17 16   CREATININE 0.98 0.97 1.04 0.78 0.80 1.04 2.23* 2.03* 1.84*  -Continue IV fluid -Recheck in the morning -Hold Entresto -Avoid NSAID.  Right ureteral stone: Presents with right flank pain.  CT renal stone study showed punctate stone in the extreme distal right ureter just proximal to the UVJ and mild collecting system dilation proximally with perinephric stranding.  Low suspicion for infection -Continue straining urine -Start Flomax -Follow urine culture  Chronic combined CHF/NICM: TTE in 2023 with LVEF of 45 to 49% (was 30 to 35% in 05/2020).  Does not seem to be on diuretics.  He is on Entresto and low-dose Coreg for GDMT.  Appears euvolemic but difficult exam due to body habitus. -Closely monitor fluid  and rate culture status while on IV fluid -Increased home Coreg to 6.25 mg twice daily -Continue holding Entresto   History of CAD: Stable.  No anginal symptoms. -Coreg as above -Continue home aspirin and Pravachol  Uncontrolled hypertension: BP elevated.  -Resumed home Coreg at increased dose -IV labetalol as needed -Continue holding Entresto in the setting of AKI  NIDDM-2: A1c 7.2%.  On metformin at  home. Recent Labs  Lab 02/18/23 2351 02/19/23 0358 02/19/23 0807 02/19/23 1143  GLUCAP 143* 158* 126* 115*  -Continue SSI  Hypothyroidism -Continue home Synthroid  History of gout: Seems to be on colchicine.  No acute flare. -Hold colchicine in the setting of renal failure  Hypokalemia -Monitor replenish as appropriate  Morbid obesity Body mass index is 40.21 kg/m. -Encourage lifestyle change to lose weight           DVT prophylaxis:  SCDs Start: 02/18/23 2140  Code Status: Full code Family Communication: Updated patient's wife at bedside Level of care: Telemetry Status is: Observation The patient will require care spanning > 2 midnights and should be moved to inpatient because: AKI requiring IV fluid, obstructive uropathy   Final disposition: Home Consultants:  Urology  55 minutes with more than 50% spent in reviewing records, counseling patient/family and coordinating care.   Sch Meds:  Scheduled Meds:  aspirin EC  81 mg Oral Daily   carvedilol  3.125 mg Oral BID WC   insulin aspart  0-9 Units Subcutaneous Q4H   levothyroxine  200 mcg Oral QAC breakfast   potassium chloride  40 mEq Oral Q4H   pravastatin  40 mg Oral QHS   tamsulosin  0.4 mg Oral Daily   Continuous Infusions:  sodium chloride     PRN Meds:.acetaminophen **OR** acetaminophen, fentaNYL (SUBLIMAZE) injection, HYDROcodone-acetaminophen, labetalol, methocarbamol, ondansetron **OR** ondansetron (ZOFRAN) IV  Antimicrobials: Anti-infectives (From admission, onward)    None        I have personally reviewed the following labs and images: CBC: Recent Labs  Lab 02/18/23 1525 02/19/23 0359  WBC 10.8* 11.5*  HGB 13.5 12.9*  HCT 40.4 38.8*  MCV 88.8 91.9  PLT 249 203   BMP &GFR Recent Labs  Lab 02/18/23 1530 02/18/23 1754 02/18/23 1900 02/19/23 0359  NA 137 141  --  137  K 3.6 3.5  --  3.4*  CL 104 106  --  108  CO2 21* 24  --  20*  GLUCOSE 215* 105*  --  155*  BUN 17  17  --  16  CREATININE 2.23* 2.03*  --  1.84*  CALCIUM 9.3 8.7*  --  8.3*  MG  --   --  1.7 2.0  PHOS  --   --  3.4 3.9   Estimated Creatinine Clearance: 68.6 mL/min (A) (by C-G formula based on SCr of 1.84 mg/dL (H)). Liver & Pancreas: Recent Labs  Lab 02/18/23 1530 02/18/23 1930 02/19/23 0359  AST 30 21 19   ALT 39 33 31  ALKPHOS 67 62 64  BILITOT 0.7 0.5 0.6  PROT 7.3 6.7 6.4*  ALBUMIN 3.9 3.5 3.3*   No results for input(s): "LIPASE", "AMYLASE" in the last 168 hours. No results for input(s): "AMMONIA" in the last 168 hours. Diabetic: Recent Labs    02/18/23 2156  HGBA1C 7.2*   Recent Labs  Lab 02/18/23 2351 02/19/23 0358 02/19/23 0807  GLUCAP 143* 158* 126*   Cardiac Enzymes: Recent Labs  Lab 02/18/23 1530  CKTOTAL 70   No results for  input(s): "PROBNP" in the last 8760 hours. Coagulation Profile: No results for input(s): "INR", "PROTIME" in the last 168 hours. Thyroid Function Tests: Recent Labs    02/18/23 1900  TSH 4.391   Lipid Profile: No results for input(s): "CHOL", "HDL", "LDLCALC", "TRIG", "CHOLHDL", "LDLDIRECT" in the last 72 hours. Anemia Panel: No results for input(s): "VITAMINB12", "FOLATE", "FERRITIN", "TIBC", "IRON", "RETICCTPCT" in the last 72 hours. Urine analysis:    Component Value Date/Time   COLORURINE YELLOW 02/18/2023 2216   APPEARANCEUR CLEAR 02/18/2023 2216   APPEARANCEUR Clear 07/04/2021 1548   LABSPEC 1.013 02/18/2023 2216   PHURINE 5.0 02/18/2023 2216   GLUCOSEU NEGATIVE 02/18/2023 2216   HGBUR LARGE (A) 02/18/2023 2216   BILIRUBINUR NEGATIVE 02/18/2023 2216   BILIRUBINUR Negative 07/04/2021 1548   KETONESUR NEGATIVE 02/18/2023 2216   PROTEINUR NEGATIVE 02/18/2023 2216   UROBILINOGEN 0.2 12/20/2012 2159   NITRITE NEGATIVE 02/18/2023 2216   LEUKOCYTESUR NEGATIVE 02/18/2023 2216   Sepsis Labs: Invalid input(s): "PROCALCITONIN", "LACTICIDVEN"  Microbiology: No results found for this or any previous visit (from the  past 240 hour(s)).  Radiology Studies: CT Renal Stone Study  Result Date: 02/18/2023 CLINICAL DATA:  Blood in urine for 3 days.  Lower abdominal pain EXAM: CT ABDOMEN AND PELVIS WITHOUT CONTRAST TECHNIQUE: Multidetector CT imaging of the abdomen and pelvis was performed following the standard protocol without IV contrast. RADIATION DOSE REDUCTION: This exam was performed according to the departmental dose-optimization program which includes automated exposure control, adjustment of the mA and/or kV according to patient size and/or use of iterative reconstruction technique. COMPARISON:  None Available. FINDINGS: Lower chest: Slight linear opacity seen along bases likely scar or atelectasis. No pleural effusion coronary artery calcifications are seen. Coronary artery calcifications are seen. Please correlate for other coronary risk factors. Hepatobiliary: On this non IV contrast exam there is diffuse fatty liver infiltration gallbladder is nondilated. Pancreas: Unremarkable. No pancreatic ductal dilatation or surrounding inflammatory changes. Spleen: Normal in size without focal abnormality. Adrenals/Urinary Tract: Adrenal glands are preserved. There is some perinephric stranding on the right with slight ectasia of the renal collecting system down to the level of the bladder. The low pelvis and bladder obscured by the significant streak artifact from both hip arthroplasties but there appears to be a tiny stone in the very distal right ureter proximal to the UVJ best seen on coronal series 601, image 78 and axial series 301, image 93. There are some separate nonobstructing left-sided renal stones measuring up to 3-4 mm. No left ureteral stone or collecting system dilatation. Grossly preserved contours of the urinary bladder but again obscured. Stomach/Bowel: On this non oral contrast exam, the large bowel has a normal course and caliber with some fluid and stool. Normal appendix extends inferior and medial to the  cecum in the right lower quadrant. Moderate debris and fluid in the stomach. Small bowel is nondilated. Vascular/Lymphatic: Aortic atherosclerosis. No enlarged abdominal or pelvic lymph nodes. Reproductive: Obscured by the streak artifact. Other: No free intra-air or free fluid. Musculoskeletal: Degenerative changes seen of the spine and pelvis. In particular along the lower lumbar spine with disc bulging and stenosis. Again bilateral hip arthroplasties with associated streak artifact obscuring the surrounding tissues. IMPRESSION: Punctate stone seen in the extreme distal right ureter just proximal to the UVJ. Mild collecting system dilatation proximally with perinephric stranding. Nonobstructing left-sided renal stones. Normal appendix. Portions of the pelvis are obscured by the significant streak artifact from the bilateral hip arthroplasties. Electronically Signed   By:  Karen Kays M.D.   On: 02/18/2023 16:57      [place] Code T. Obrien Huskins [facility]  If 7PM-7AM, please contact night-coverage [email or web address] 02/19/2023, 11:33 AM

## 2023-02-20 ENCOUNTER — Inpatient Hospital Stay (HOSPITAL_COMMUNITY): Payer: BC Managed Care – PPO

## 2023-02-20 DIAGNOSIS — I2510 Atherosclerotic heart disease of native coronary artery without angina pectoris: Secondary | ICD-10-CM | POA: Diagnosis not present

## 2023-02-20 DIAGNOSIS — I10 Essential (primary) hypertension: Secondary | ICD-10-CM | POA: Diagnosis not present

## 2023-02-20 DIAGNOSIS — I5042 Chronic combined systolic (congestive) and diastolic (congestive) heart failure: Secondary | ICD-10-CM | POA: Diagnosis not present

## 2023-02-20 DIAGNOSIS — N179 Acute kidney failure, unspecified: Secondary | ICD-10-CM | POA: Diagnosis not present

## 2023-02-20 LAB — RENAL FUNCTION PANEL
Albumin: 3.1 g/dL — ABNORMAL LOW (ref 3.5–5.0)
Anion gap: 8 (ref 5–15)
BUN: 17 mg/dL (ref 6–20)
CO2: 21 mmol/L — ABNORMAL LOW (ref 22–32)
Calcium: 8.8 mg/dL — ABNORMAL LOW (ref 8.9–10.3)
Chloride: 109 mmol/L (ref 98–111)
Creatinine, Ser: 1.66 mg/dL — ABNORMAL HIGH (ref 0.61–1.24)
GFR, Estimated: 52 mL/min — ABNORMAL LOW (ref >60)
Glucose, Bld: 155 mg/dL — ABNORMAL HIGH (ref 70–99)
Phosphorus: 3.9 mg/dL (ref 2.5–4.6)
Potassium: 3.9 mmol/L (ref 3.5–5.1)
Sodium: 138 mmol/L (ref 135–145)

## 2023-02-20 LAB — CBC
HCT: 35.9 % — ABNORMAL LOW (ref 39.0–52.0)
Hemoglobin: 11.8 g/dL — ABNORMAL LOW (ref 13.0–17.0)
MCH: 30.1 pg (ref 26.0–34.0)
MCHC: 32.9 g/dL (ref 30.0–36.0)
MCV: 91.6 fL (ref 80.0–100.0)
Platelets: 188 10*3/uL (ref 150–400)
RBC: 3.92 MIL/uL — ABNORMAL LOW (ref 4.22–5.81)
RDW: 13.2 % (ref 11.5–15.5)
WBC: 9.6 10*3/uL (ref 4.0–10.5)
nRBC: 0 % (ref 0.0–0.2)

## 2023-02-20 LAB — GLUCOSE, CAPILLARY
Glucose-Capillary: 149 mg/dL — ABNORMAL HIGH (ref 70–99)
Glucose-Capillary: 133 mg/dL — ABNORMAL HIGH (ref 70–99)

## 2023-02-20 LAB — CK: Total CK: 52 U/L (ref 49–397)

## 2023-02-20 LAB — MAGNESIUM: Magnesium: 1.8 mg/dL (ref 1.7–2.4)

## 2023-02-20 MED ORDER — ENTRESTO 97-103 MG PO TABS: 1.0000 | ORAL_TABLET | Freq: Two times a day (BID) | ORAL | Status: DC

## 2023-02-20 MED ORDER — CARVEDILOL 12.5 MG PO TABS: 12.5000 mg | ORAL_TABLET | Freq: Two times a day (BID) | ORAL | 1 refills | Status: DC

## 2023-02-20 MED ORDER — CARVEDILOL 12.5 MG PO TABS
12.5000 mg | ORAL_TABLET | Freq: Two times a day (BID) | ORAL | Status: DC
  Administered 2023-02-20: 12.5 mg via ORAL
  Filled 2023-02-20: qty 1

## 2023-02-20 MED ORDER — TAMSULOSIN HCL 0.4 MG PO CAPS: 0.4000 mg | ORAL_CAPSULE | Freq: Every day | ORAL | 0 refills | Status: DC

## 2023-02-20 NOTE — Progress Notes (Signed)
Urology Inpatient Progress Report  Ureterolithiasis [N20.1] Flank pain [R10.9] AKI (acute kidney injury) (HCC) [N17.9]        Intv/Subj: No acute events overnight. Patient is without complaint.  No longer having any right-sided abdominal pain.  States that he did not strain his urine 1 time.  Does not think that he saw a stone pass.  Creatinine improving.  Principal Problem:   AKI (acute kidney injury) (HCC) Active Problems:   Tobacco abuse   Postsurgical hypothyroidism   Essential hypertension   Type 2 diabetes mellitus with hyperglycemia, without long-term current use of insulin (HCC)   Nonischemic cardiomyopathy (HCC)   Morbid obesity (HCC)   Coronary artery disease involving native coronary artery of native heart without angina pectoris   Chronic combined systolic and diastolic CHF (congestive heart failure) (HCC)   Ureteral stone  Current Facility-Administered Medications  Medication Dose Route Frequency Provider Last Rate Last Admin   0.9 %  sodium chloride infusion   Intravenous Continuous Almon Hercules, MD 75 mL/hr at 02/20/23 0041 New Bag at 02/20/23 0041   acetaminophen (TYLENOL) tablet 650 mg  650 mg Oral Q6H PRN Therisa Doyne, MD       Or   acetaminophen (TYLENOL) suppository 650 mg  650 mg Rectal Q6H PRN Doutova, Anastassia, MD       aspirin EC tablet 81 mg  81 mg Oral Daily Gonfa, Taye T, MD   81 mg at 02/19/23 1152   carvedilol (COREG) tablet 12.5 mg  12.5 mg Oral BID WC Gonfa, Taye T, MD   12.5 mg at 02/20/23 0836   fentaNYL (SUBLIMAZE) injection 12.5-50 mcg  12.5-50 mcg Intravenous Q2H PRN Doutova, Jonny Ruiz, MD       HYDROcodone-acetaminophen (NORCO/VICODIN) 5-325 MG per tablet 1-2 tablet  1-2 tablet Oral Q4H PRN Doutova, Anastassia, MD       insulin aspart (novoLOG) injection 0-9 Units  0-9 Units Subcutaneous Q4H Doutova, Anastassia, MD   1 Units at 02/20/23 0836   labetalol (NORMODYNE) injection 10 mg  10 mg Intravenous Q2H PRN Candelaria Stagers T, MD   10  mg at 02/20/23 0122   levothyroxine (SYNTHROID) tablet 200 mcg  200 mcg Oral QAC breakfast Therisa Doyne, MD   200 mcg at 02/20/23 0500   methocarbamol (ROBAXIN) tablet 500 mg  500 mg Oral Q6H PRN Almon Hercules, MD       ondansetron (ZOFRAN) tablet 4 mg  4 mg Oral Q6H PRN Doutova, Anastassia, MD       Or   ondansetron (ZOFRAN) injection 4 mg  4 mg Intravenous Q6H PRN Doutova, Anastassia, MD       pravastatin (PRAVACHOL) tablet 40 mg  40 mg Oral QHS Doutova, Anastassia, MD   40 mg at 02/19/23 2152   tamsulosin (FLOMAX) capsule 0.4 mg  0.4 mg Oral Daily Candelaria Stagers T, MD   0.4 mg at 02/20/23 0836     Objective: Vital: Vitals:   02/19/23 1318 02/19/23 2024 02/20/23 0117 02/20/23 0410  BP: (!) 142/96 (!) 154/106 (!) 156/105 (!) 145/92  Pulse: 76 81  77  Resp: 18 18  18   Temp: 98.2 F (36.8 C) 97.9 F (36.6 C)  97.8 F (36.6 C)  TempSrc: Oral Oral  Oral  SpO2: 98% 98%  96%  Weight:      Height:       I/Os: I/O last 3 completed shifts: In: 1383.6 [P.O.:360; I.V.:1023.6] Out: 2600 [Urine:2600]  Physical Exam:  General: Patient is in no apparent  distress Lungs: Normal respiratory effort, chest expands symmetrically. GI: The abdomen is soft and nontender without mass. Ext: lower extremities symmetric  Lab Results: Recent Labs    02/18/23 1525 02/19/23 0359 02/20/23 0416  WBC 10.8* 11.5* 9.6  HGB 13.5 12.9* 11.8*  HCT 40.4 38.8* 35.9*   Recent Labs    02/18/23 1754 02/19/23 0359 02/20/23 0416  NA 141 137 138  K 3.5 3.4* 3.9  CL 106 108 109  CO2 24 20* 21*  GLUCOSE 105* 155* 155*  BUN 17 16 17   CREATININE 2.03* 1.84* 1.66*  CALCIUM 8.7* 8.3* 8.8*   No results for input(s): "LABPT", "INR" in the last 72 hours. No results for input(s): "LABURIN" in the last 72 hours. Results for orders placed or performed during the hospital encounter of 02/18/23  Urine Culture     Status: None   Collection Time: 02/18/23  5:54 PM   Specimen: Urine, Clean Catch  Result  Value Ref Range Status   Specimen Description   Final    URINE, CLEAN CATCH Performed at [facility], [address] [place], [place] [address]    Special Requests   Final    NONE Performed at [facility], [address] [place], [place] [address]    Culture   Final    NO GROWTH Performed at [facility], [address] [place], [place] [address]    Report Status 02/19/2023 FINAL  Final    Studies/Results: CT Renal Stone Study  Result Date: 02/18/2023 CLINICAL DATA:  Blood in urine for 3 days.  Lower abdominal pain EXAM: CT ABDOMEN AND PELVIS WITHOUT CONTRAST TECHNIQUE: Multidetector CT imaging of the abdomen and pelvis was performed following the standard protocol without IV contrast. RADIATION DOSE REDUCTION: This exam was performed according to the departmental dose-optimization program which includes automated exposure control, adjustment of the mA and/or kV according to patient size and/or use of iterative reconstruction technique. COMPARISON:  None Available. FINDINGS: Lower chest: Slight linear opacity seen along bases likely scar or atelectasis. No pleural effusion coronary artery calcifications are seen. Coronary artery calcifications are seen. Please correlate for other coronary risk factors. Hepatobiliary: On this non IV contrast exam there is diffuse fatty liver infiltration gallbladder is nondilated. Pancreas: Unremarkable. No pancreatic ductal dilatation or surrounding inflammatory changes. Spleen: Normal in size without focal abnormality. Adrenals/Urinary Tract: Adrenal glands are preserved. There is some perinephric stranding on the right with slight ectasia of the renal collecting system down to the level of the bladder. The low pelvis and bladder obscured by the significant streak artifact from both hip arthroplasties but there appears to be a tiny stone in the very distal right ureter proximal to the UVJ best seen on coronal series 601, image 78  and axial series 301, image 93. There are some separate nonobstructing left-sided renal stones measuring up to 3-4 mm. No left ureteral stone or collecting system dilatation. Grossly preserved contours of the urinary bladder but again obscured. Stomach/Bowel: On this non oral contrast exam, the large bowel has a normal course and caliber with some fluid and stool. Normal appendix extends inferior and medial to the cecum in the right lower quadrant. Moderate debris and fluid in the stomach. Small bowel is nondilated. Vascular/Lymphatic: Aortic atherosclerosis. No enlarged abdominal or pelvic lymph nodes. Reproductive: Obscured by the streak artifact. Other: No free intra-air or free fluid. Musculoskeletal: Degenerative changes seen of the spine and pelvis. In particular along the lower  lumbar spine with disc bulging and stenosis. Again bilateral hip arthroplasties with associated streak artifact obscuring the surrounding tissues. IMPRESSION: Punctate stone seen in the extreme distal right ureter just proximal to the UVJ. Mild collecting system dilatation proximally with perinephric stranding. Nonobstructing left-sided renal stones. Normal appendix. Portions of the pelvis are obscured by the significant streak artifact from the bilateral hip arthroplasties. Electronically Signed   By: Karen Kays M.D.   On: 02/18/2023 16:57    Assessment: Right ureteral calculus Left renal calculi Acute renal insufficiency, improving  Plan: I will obtain a renal ultrasound today to ensure resolution of hydronephrosis.  Regardless, creatinine improving and okay for discharge from urological standpoint.  Follow-up next Friday in the urology clinic for reassessment.   Modena Slater, MD Urology 02/20/2023, 8:57 AM

## 2023-02-20 NOTE — Discharge Summary (Signed)
Physician Discharge Summary  Tyler [name]:[id number] DOB: April 12, 1976 DOA: 02/18/2023  PCP: Sonny Masters, FNP  Admit date: 02/18/2023 Discharge date: 02/20/2023 Admitted From: Home Disposition: Home Recommendations for Outpatient Follow-up:  Outpatient follow-up with PCP and urology as below Recommend outpatient follow-up with cardiology in 2 to 3 weeks Could benefit from sleep apnea evaluation Stop Entresto to allow renal recovery.  Increased Coreg for better BP control. Check BP, fluid status, CMP and CBC in 1 week Please follow up on the following pending results: None  Home Health: Not indicated Equipment/Devices: Not indicated  Discharge Condition: Stable CODE STATUS: Full code  Follow-up Information     Reginia Forts Doralee Albino, FNP. Schedule an appointment as soon as possible for a visit in 1 week(s).   Specialty: Family Medicine Contact information: [address] [place] [address] [phone]         Noel Christmas, MD. Schedule an appointment as soon as possible for a visit in 2 week(s).   Specialty: Urology Contact information: [address] [place] [address] [place] [address] [phone]                 Hospital course 47 year old M with PMH of combined CHF/NICM, CAD, DM-2, morbid obesity, gout and left THA presenting with right flank pain radiating to right inguinal region and decreased p.o. intake 4 days and admitted for AKI and right ureteral stone. Cr 2.23 (baseline 0.8-1).  CT renal stone study showed punctate stone in the extreme distal right ureter just proximal to the UVJ and mild collecting system dilation proximally with perinephric stranding.  Urology consulted and recommended conservative management as long as AKI improves.   Patient's pain resolved.  AKI improved.  Renal ultrasound without residual hydronephrosis.  Urine culture NGTD.  Cleared for discharge by urology for outpatient follow-up.  Advised to hold Entresto for 1 week to  allow renal recovery.  Increase Coreg to 12.5 mg twice daily for better blood pressure control.  Encouraged to avoid NSAID and discontinued meloxicam.  See individual problem list below for more.   Problems addressed during this hospitalization Principal Problem:   AKI (acute kidney injury) (HCC) Active Problems:   Tobacco abuse   Postsurgical hypothyroidism   Essential hypertension   Type 2 diabetes mellitus with hyperglycemia, without long-term current use of insulin (HCC)   Nonischemic cardiomyopathy (HCC)   Morbid obesity (HCC)   Coronary artery disease involving native coronary artery of native heart without angina pectoris   Chronic combined systolic and diastolic CHF (congestive heart failure) (HCC)   Ureteral stone   AKI: Baseline Cr ~0.8-1.0.  Likely due to poor p.o. intake, concurrent use of Entresto, Mobic and Celebrex (and obstructive uropathy from kidney stone.  Improved. Recent Labs    03/27/22 1618 04/23/22 0821 06/09/22 1516 08/28/22 1352 09/25/22 1214 02/03/23 1459 02/18/23 1530 02/18/23 1754 02/19/23 0359 02/20/23 0416  BUN 9 11 9 10 11 8 17 17 16 17   CREATININE 0.98 0.97 1.04 0.78 0.80 1.04 2.23* 2.03* 1.84* 1.66*  -Recheck renal function in 1 week    Right ureteral stone: Presents with right flank pain.  CT renal stone study showed punctate stone in the extreme distal right ureter just proximal to the UVJ and mild collecting system dilation proximally with perinephric stranding.  Low suspicion for infection.  Urine culture NGTD.  Renal ultrasound without hydronephrosis.  Pain resolved. -Flomax for 10 days -Outpatient follow-up with urology as above   Chronic combined CHF/NICM: TTE  in 2023 with LVEF of 45 to 49% (was 30 to 35% in 05/2020).  Does not seem to be on diuretics.  He is on Entresto and low-dose Coreg for GDMT.  Appears euvolemic but difficult exam due to body habitus. -Recommend holding Entresto for 1 week to allow renal recovery -Increased  Coreg to 12.5 mg twice daily.  He was on 3.125 mg twice daily before admission. -Consider SGL 2 inhibitors. -Reassess BP, renal function and fluid status and adjust meds as appropriate -Recommend follow-up with cardiology in 2 to 3 weeks  History of CAD: Stable.  No anginal symptoms. -Coreg as above -Continue home aspirin and Pravachol   Uncontrolled hypertension: BP elevated but improved.  -Adjusted cardiac meds as above -Could benefit from sleep study to rule out sleep apnea   NIDDM-2: A1c 7.2%.  On metformin at home. -Continue home meds -Consider SGLT2 inhibitors   Hypothyroidism -Continue home Synthroid   History of gout: Seems to be on colchicine.  No acute flare. -Continue home meds   Hypokalemia: Resolved  At risk for sleep apnea: Patient's wife reports snoring.  Per wife, previously tested for sleep apnea and told to have "mild sleep apnea". -Recommend referral to sleep clinic for reevaluation.   Morbid obesity Body mass index is 40.21 kg/m. -Encourage lifestyle change to lose weight -Consider GLP-1 agonist           Time spent 45 minutes  Vital signs Vitals:   02/19/23 1318 02/19/23 2024 02/20/23 0117 02/20/23 0410  BP: (!) 142/96 (!) 154/106 (!) 156/105 (!) 145/92  Pulse: 76 81  77  Temp: 98.2 F (36.8 C) 97.9 F (36.6 C)  97.8 F (36.6 C)  Resp: 18 18  18   Height:      Weight:      SpO2: 98% 98%  96%  TempSrc: Oral Oral  Oral  BMI (Calculated):         Discharge exam  GENERAL: No apparent distress.  Nontoxic. HEENT: MMM.  Vision and hearing grossly intact.  NECK: Supple.  No apparent JVD.  RESP:  No IWOB.  Fair aeration bilaterally. CVS:  RRR. Heart sounds normal.  ABD/GI/GU: BS+. Abd soft, NTND.  MSK/EXT:  Moves extremities. No apparent deformity. No edema.  SKIN: no apparent skin lesion or wound NEURO: Awake and alert. Oriented appropriately.  No apparent focal neuro deficit. PSYCH: Calm. Normal affect.   Discharge  Instructions Discharge Instructions     Diet - low sodium heart healthy   Complete by: As directed    Diet Carb Modified   Complete by: As directed    Discharge instructions   Complete by: As directed    It has been a pleasure taking care of you!  You were hospitalized due to acute kidney injury and kidney stone.  Your kidney function has improved.  We have made some changes to your medication until your kidney recovers.  We have also started you on Flomax for kidney stone.  Please review your new medication list and the directions on your medications before you take them.  Avoid any over-the-counter pain medication other than plain Tylenol.  We strongly recommend good hydration.  Please follow-up with your primary care doctor in 1 week to have your kidney numbers rechecked.    Take care,   Increase activity slowly   Complete by: As directed       Allergies as of 02/20/2023       Reactions   Isosorbide Other (See Comments)  Low blood pressure        Medication List     STOP taking these medications    meloxicam 15 MG tablet Commonly known as: MOBIC       TAKE these medications    aspirin EC 81 MG tablet Take 81 mg by mouth daily.   carvedilol 12.5 MG tablet Commonly known as: COREG Take 1 tablet (12.5 mg total) by mouth 2 (two) times daily with a meal. What changed:  medication strength how much to take additional instructions   celecoxib 200 MG capsule Commonly known as: CELEBREX Take 200 mg by mouth daily.   colchicine 0.6 MG tablet TAKE 2 TABLETS AT ONSET, THEN 1 TABLET 2 HOURS LATER- A MAXIMUM OF 3 TABLETS IN 24 HOURS What changed: See the new instructions.   Entresto 97-103 MG Generic drug: sacubitril-valsartan Take 1 tablet by mouth 2 (two) times daily. Start taking on: February 27, 2023 What changed: These instructions start on February 27, 2023. If you are unsure what to do until then, ask your doctor or other care provider.   levothyroxine  200 MCG tablet Commonly known as: SYNTHROID Take 200 mcg by mouth daily before breakfast.   metFORMIN 1000 MG tablet Commonly known as: GLUCOPHAGE TAKE 1 TABLET (1,000 MG TOTAL) BY MOUTH TWICE A DAY WITH FOOD What changed: See the new instructions.   methocarbamol 500 MG tablet Commonly known as: ROBAXIN Take 1 tablet (500 mg total) by mouth every 6 (six) hours as needed for muscle spasms.   mupirocin ointment 2 % Commonly known as: BACTROBAN Apply 1 Application topically 2 (two) times daily as needed (irritation/infection).   nystatin cream Commonly known as: MYCOSTATIN Apply 1 Application topically 2 (two) times daily as needed (irritation).   nystatin powder Commonly known as: MYCOSTATIN/NYSTOP Apply 1 Application topically 2 (two) times daily as needed (irritation).   omega-3 acid ethyl esters 1 g capsule Commonly known as: LOVAZA TAKE 2 CAPSULES BY MOUTH TWICE A DAY What changed:  how much to take how to take this when to take this additional instructions   pravastatin 40 MG tablet Commonly known as: PRAVACHOL TAKE 1 TABLET BY MOUTH EVERY DAY What changed: when to take this   tamsulosin 0.4 MG Caps capsule Commonly known as: FLOMAX Take 1 capsule (0.4 mg total) by mouth daily.   TURMERIC CURCUMIN PO Take 1,000 mg by mouth 2 (two) times daily.   valACYclovir 1000 MG tablet Commonly known as: VALTREX Take 1,000-2,000 mg by mouth See admin instructions. Take 2000 mg at onset of fever blisters, then take 1000 mg daily as needed   Vitamin D (Ergocalciferol) 1.25 MG (50000 UNIT) Caps capsule Commonly known as: DRISDOL Take 50,000 Units by mouth every Sunday.        Consultations: Urology  Procedures/Studies:   US RENAL  Result Date: 02/20/2023 CLINICAL DATA:  Ureteral calculi. EXAM: RENAL / URINARY TRACT ULTRASOUND COMPLETE COMPARISON:  CT abdomen pelvis dated 02/18/2023. FINDINGS: Right Kidney: Renal measurements: 13.3 x 6.2 x 7.3 cm = volume: 316 mL.  Echogenicity within normal limits. No mass or hydronephrosis visualized. Left Kidney: Renal measurements: 12.2 x 5.3 x 7.0 cm = volume: 237 mL. Echogenicity within normal limits. No mass or hydronephrosis visualized. Bladder: Appears normal for degree of bladder distention. Other: None. IMPRESSION: No hydronephrosis. Electronically Signed   By: Romona Curls M.D.   On: 02/20/2023 13:45   CT Renal Stone Study  Result Date: 02/18/2023 CLINICAL DATA:  Blood in urine for 3  days.  Lower abdominal pain EXAM: CT ABDOMEN AND PELVIS WITHOUT CONTRAST TECHNIQUE: Multidetector CT imaging of the abdomen and pelvis was performed following the standard protocol without IV contrast. RADIATION DOSE REDUCTION: This exam was performed according to the departmental dose-optimization program which includes automated exposure control, adjustment of the mA and/or kV according to patient size and/or use of iterative reconstruction technique. COMPARISON:  None Available. FINDINGS: Lower chest: Slight linear opacity seen along bases likely scar or atelectasis. No pleural effusion coronary artery calcifications are seen. Coronary artery calcifications are seen. Please correlate for other coronary risk factors. Hepatobiliary: On this non IV contrast exam there is diffuse fatty liver infiltration gallbladder is nondilated. Pancreas: Unremarkable. No pancreatic ductal dilatation or surrounding inflammatory changes. Spleen: Normal in size without focal abnormality. Adrenals/Urinary Tract: Adrenal glands are preserved. There is some perinephric stranding on the right with slight ectasia of the renal collecting system down to the level of the bladder. The low pelvis and bladder obscured by the significant streak artifact from both hip arthroplasties but there appears to be a tiny stone in the very distal right ureter proximal to the UVJ best seen on coronal series 601, image 78 and axial series 301, image 93. There are some separate  nonobstructing left-sided renal stones measuring up to 3-4 mm. No left ureteral stone or collecting system dilatation. Grossly preserved contours of the urinary bladder but again obscured. Stomach/Bowel: On this non oral contrast exam, the large bowel has a normal course and caliber with some fluid and stool. Normal appendix extends inferior and medial to the cecum in the right lower quadrant. Moderate debris and fluid in the stomach. Small bowel is nondilated. Vascular/Lymphatic: Aortic atherosclerosis. No enlarged abdominal or pelvic lymph nodes. Reproductive: Obscured by the streak artifact. Other: No free intra-air or free fluid. Musculoskeletal: Degenerative changes seen of the spine and pelvis. In particular along the lower lumbar spine with disc bulging and stenosis. Again bilateral hip arthroplasties with associated streak artifact obscuring the surrounding tissues. IMPRESSION: Punctate stone seen in the extreme distal right ureter just proximal to the UVJ. Mild collecting system dilatation proximally with perinephric stranding. Nonobstructing left-sided renal stones. Normal appendix. Portions of the pelvis are obscured by the significant streak artifact from the bilateral hip arthroplasties. Electronically Signed   By: Karen Kays M.D.   On: 02/18/2023 16:57   CT Angio Chest PE W and/or Wo Contrast  Result Date: 02/03/2023 CLINICAL DATA:  Shortness of breath chest pain and coughing EXAM: CT ANGIOGRAPHY CHEST WITH CONTRAST TECHNIQUE: Multidetector CT imaging of the chest was performed using the standard protocol during bolus administration of intravenous contrast. Multiplanar CT image reconstructions and MIPs were obtained to evaluate the vascular anatomy. RADIATION DOSE REDUCTION: This exam was performed according to the departmental dose-optimization program which includes automated exposure control, adjustment of the mA and/or kV according to patient size and/or use of iterative reconstruction  technique. CONTRAST:  OMNIPAQUE IOHEXOL 350 MG/ML SOLN COMPARISON:  Chest x-ray 02/03/2023 FINDINGS: Cardiovascular: Satisfactory opacification of the pulmonary arteries to the segmental level. No evidence of pulmonary embolism. Normal heart size. No pericardial effusion. Maximum ascending aortic diameter of 4.4 cm. Mild atherosclerosis. Mediastinum/Nodes: No enlarged mediastinal, hilar, or axillary lymph nodes. Status post thyroidectomy. Esophagus within normal limits. Lungs/Pleura: Lungs are clear. No pleural effusion or pneumothorax. Upper Abdomen: No acute abnormality. Musculoskeletal: No chest wall abnormality. No acute or significant osseous findings. Review of the MIP images confirms the above findings. IMPRESSION: 1. Negative for acute  pulmonary embolus or aortic dissection. 2. Aortic atherosclerosis. Ascending aortic diameter up to 4.4 cm. Recommend annual imaging followup by CTA or MRA. This recommendation follows 2010 ACCF/AHA/AATS/ACR/ASA/SCA/SCAI/SIR/STS/SVM Guidelines for the Diagnosis and Management of Patients with Thoracic Aortic Disease. Circulation. 2010; 121: Q657-Q469. Aortic aneurysm NOS (ICD10-I71.9) Aortic Atherosclerosis (ICD10-I70.0). Aortic aneurysm NOS (ICD10-I71.9). Electronically Signed   By: Jasmine Pang M.D.   On: 02/03/2023 21:05   DG Chest 2 View  Result Date: 02/03/2023 CLINICAL DATA:  Chest pain, shortness of breath EXAM: CHEST - 2 VIEW COMPARISON:  06/18/2022 FINDINGS: Mild cardiomegaly. Both lungs are clear. The visualized skeletal structures are unremarkable. IMPRESSION: Mild cardiomegaly without acute abnormality of the lungs. Electronically Signed   By: Jearld Lesch M.D.   On: 02/03/2023 15:54       The results of significant diagnostics from this hospitalization (including imaging, microbiology, ancillary and laboratory) are listed below for reference.     Microbiology: Recent Results (from the past 240 hour(s))  Urine Culture     Status: None    Collection Time: 02/18/23  5:54 PM   Specimen: Urine, Clean Catch  Result Value Ref Range Status   Specimen Description   Final    URINE, CLEAN CATCH Performed at [facility], [address] [place], [place] [address]    Special Requests   Final    NONE Performed at [facility], [address] [place], [place] [address]    Culture   Final    NO GROWTH Performed at [facility], [address] [place], [place] [address]    Report Status 02/19/2023 FINAL  Final     Labs:  CBC: Recent Labs  Lab 02/18/23 1525 02/19/23 0359 02/20/23 0416  WBC 10.8* 11.5* 9.6  HGB 13.5 12.9* 11.8*  HCT 40.4 38.8* 35.9*  MCV 88.8 91.9 91.6  PLT 249 203 188   BMP &GFR Recent Labs  Lab 02/18/23 1530 02/18/23 1754 02/18/23 1900 02/19/23 0359 02/20/23 0416  NA 137 141  --  137 138  K 3.6 3.5  --  3.4* 3.9  CL 104 106  --  108 109  CO2 21* 24  --  20* 21*  GLUCOSE 215* 105*  --  155* 155*  BUN 17 17  --  16 17  CREATININE 2.23* 2.03*  --  1.84* 1.66*  CALCIUM 9.3 8.7*  --  8.3* 8.8*  MG  --   --  1.7 2.0 1.8  PHOS  --   --  3.4 3.9 3.9   Estimated Creatinine Clearance: 76 mL/min (A) (by C-G formula based on SCr of 1.66 mg/dL (H)). Liver & Pancreas: Recent Labs  Lab 02/18/23 1530 02/18/23 1930 02/19/23 0359 02/20/23 0416  AST 30 21 19   --   ALT 39 33 31  --   ALKPHOS 67 62 64  --   BILITOT 0.7 0.5 0.6  --   PROT 7.3 6.7 6.4*  --   ALBUMIN 3.9 3.5 3.3* 3.1*   No results for input(s): "LIPASE", "AMYLASE" in the last 168 hours. No results for input(s): "AMMONIA" in the last 168 hours. Diabetic: Recent Labs    02/18/23 2156  HGBA1C 7.2*   Recent Labs  Lab 02/19/23 1601 02/19/23 2021 02/19/23 2349 02/20/23 0418 02/20/23 0739  GLUCAP 137* 159* 110* 149* 133*   Cardiac Enzymes: Recent Labs  Lab 02/18/23 1530 02/20/23 0416  CKTOTAL 70 52   No results for input(s): "PROBNP" in the  last 8760 hours. Coagulation Profile: No results  for input(s): "INR", "PROTIME" in the last 168 hours. Thyroid Function Tests: Recent Labs    02/18/23 1900  TSH 4.391   Lipid Profile: No results for input(s): "CHOL", "HDL", "LDLCALC", "TRIG", "CHOLHDL", "LDLDIRECT" in the last 72 hours. Anemia Panel: No results for input(s): "VITAMINB12", "FOLATE", "FERRITIN", "TIBC", "IRON", "RETICCTPCT" in the last 72 hours. Urine analysis:    Component Value Date/Time   COLORURINE YELLOW 02/18/2023 2216   APPEARANCEUR CLEAR 02/18/2023 2216   APPEARANCEUR Clear 07/04/2021 1548   LABSPEC 1.013 02/18/2023 2216   PHURINE 5.0 02/18/2023 2216   GLUCOSEU NEGATIVE 02/18/2023 2216   HGBUR LARGE (A) 02/18/2023 2216   BILIRUBINUR NEGATIVE 02/18/2023 2216   BILIRUBINUR Negative 07/04/2021 1548   KETONESUR NEGATIVE 02/18/2023 2216   PROTEINUR NEGATIVE 02/18/2023 2216   UROBILINOGEN 0.2 12/20/2012 2159   NITRITE NEGATIVE 02/18/2023 2216   LEUKOCYTESUR NEGATIVE 02/18/2023 2216   Sepsis Labs: Invalid input(s): "PROCALCITONIN", "LACTICIDVEN"   SIGNED:  Almon Hercules, MD  [facility] 02/20/2023, 2:18 PM

## 2023-02-20 NOTE — Plan of Care (Signed)
  Problem: Metabolic: Goal: Ability to maintain appropriate glucose levels will improve Outcome: Progressing   Problem: Coping: Goal: Ability to adjust to condition or change in health will improve Outcome: Progressing   Problem: Safety: Goal: Ability to remain free from injury will improve Outcome: Progressing   Problem: Pain Managment: Goal: General experience of comfort will improve Outcome: Progressing

## 2023-02-23 DIAGNOSIS — I428 Other cardiomyopathies: Secondary | ICD-10-CM | POA: Diagnosis not present

## 2023-02-23 DIAGNOSIS — I5022 Chronic systolic (congestive) heart failure: Secondary | ICD-10-CM | POA: Diagnosis not present

## 2023-02-23 DIAGNOSIS — R9431 Abnormal electrocardiogram [ECG] [EKG]: Secondary | ICD-10-CM | POA: Diagnosis not present

## 2023-02-23 DIAGNOSIS — E1169 Type 2 diabetes mellitus with other specified complication: Secondary | ICD-10-CM | POA: Diagnosis not present

## 2023-02-23 DIAGNOSIS — Z1331 Encounter for screening for depression: Secondary | ICD-10-CM | POA: Diagnosis not present

## 2023-02-23 DIAGNOSIS — I10 Essential (primary) hypertension: Secondary | ICD-10-CM | POA: Diagnosis not present

## 2023-02-24 ENCOUNTER — Encounter (HOSPITAL_COMMUNITY): Payer: Self-pay

## 2023-02-24 ENCOUNTER — Telehealth: Payer: Self-pay

## 2023-02-24 ENCOUNTER — Inpatient Hospital Stay (HOSPITAL_COMMUNITY)
Admission: EM | Admit: 2023-02-24 | Discharge: 2023-02-26 | DRG: 660 | Disposition: A | Discharge status: Home / Self Care | Payer: BC Managed Care – PPO | Attending: Internal Medicine | Admitting: Internal Medicine

## 2023-02-24 ENCOUNTER — Emergency Department (HOSPITAL_COMMUNITY): Payer: BC Managed Care – PPO

## 2023-02-24 DIAGNOSIS — I2510 Atherosclerotic heart disease of native coronary artery without angina pectoris: Secondary | ICD-10-CM | POA: Diagnosis not present

## 2023-02-24 DIAGNOSIS — Z8701 Personal history of pneumonia (recurrent): Secondary | ICD-10-CM | POA: Diagnosis not present

## 2023-02-24 DIAGNOSIS — N201 Calculus of ureter: Principal | ICD-10-CM | POA: Diagnosis present

## 2023-02-24 DIAGNOSIS — Z7982 Long term (current) use of aspirin: Secondary | ICD-10-CM | POA: Diagnosis not present

## 2023-02-24 DIAGNOSIS — Z9109 Other allergy status, other than to drugs and biological substances: Secondary | ICD-10-CM

## 2023-02-24 DIAGNOSIS — I160 Hypertensive urgency: Secondary | ICD-10-CM

## 2023-02-24 DIAGNOSIS — Z8249 Family history of ischemic heart disease and other diseases of the circulatory system: Secondary | ICD-10-CM | POA: Diagnosis not present

## 2023-02-24 DIAGNOSIS — F17210 Nicotine dependence, cigarettes, uncomplicated: Secondary | ICD-10-CM | POA: Diagnosis present

## 2023-02-24 DIAGNOSIS — Z79890 Hormone replacement therapy (postmenopausal): Secondary | ICD-10-CM

## 2023-02-24 DIAGNOSIS — E291 Testicular hypofunction: Secondary | ICD-10-CM | POA: Diagnosis not present

## 2023-02-24 DIAGNOSIS — I110 Hypertensive heart disease with heart failure: Secondary | ICD-10-CM | POA: Diagnosis not present

## 2023-02-24 DIAGNOSIS — Z79899 Other long term (current) drug therapy: Secondary | ICD-10-CM | POA: Diagnosis not present

## 2023-02-24 DIAGNOSIS — E872 Acidosis, unspecified: Secondary | ICD-10-CM | POA: Diagnosis not present

## 2023-02-24 DIAGNOSIS — N202 Calculus of kidney with calculus of ureter: Secondary | ICD-10-CM | POA: Diagnosis not present

## 2023-02-24 DIAGNOSIS — Z83438 Family history of other disorder of lipoprotein metabolism and other lipidemia: Secondary | ICD-10-CM | POA: Diagnosis not present

## 2023-02-24 DIAGNOSIS — Z96643 Presence of artificial hip joint, bilateral: Secondary | ICD-10-CM | POA: Diagnosis present

## 2023-02-24 DIAGNOSIS — N2882 Megaloureter: Secondary | ICD-10-CM | POA: Diagnosis not present

## 2023-02-24 DIAGNOSIS — N179 Acute kidney failure, unspecified: Secondary | ICD-10-CM | POA: Diagnosis present

## 2023-02-24 DIAGNOSIS — Z6841 Body Mass Index (BMI) 40.0 and over, adult: Secondary | ICD-10-CM | POA: Diagnosis not present

## 2023-02-24 DIAGNOSIS — G4730 Sleep apnea, unspecified: Secondary | ICD-10-CM | POA: Diagnosis present

## 2023-02-24 DIAGNOSIS — N135 Crossing vessel and stricture of ureter without hydronephrosis: Secondary | ICD-10-CM

## 2023-02-24 DIAGNOSIS — M109 Gout, unspecified: Secondary | ICD-10-CM | POA: Diagnosis present

## 2023-02-24 DIAGNOSIS — Z85850 Personal history of malignant neoplasm of thyroid: Secondary | ICD-10-CM | POA: Diagnosis not present

## 2023-02-24 DIAGNOSIS — E7800 Pure hypercholesterolemia, unspecified: Secondary | ICD-10-CM | POA: Diagnosis present

## 2023-02-24 DIAGNOSIS — I5042 Chronic combined systolic (congestive) and diastolic (congestive) heart failure: Secondary | ICD-10-CM | POA: Diagnosis present

## 2023-02-24 DIAGNOSIS — I428 Other cardiomyopathies: Secondary | ICD-10-CM | POA: Diagnosis present

## 2023-02-24 DIAGNOSIS — I509 Heart failure, unspecified: Secondary | ICD-10-CM | POA: Diagnosis not present

## 2023-02-24 DIAGNOSIS — G4733 Obstructive sleep apnea (adult) (pediatric): Secondary | ICD-10-CM | POA: Diagnosis present

## 2023-02-24 DIAGNOSIS — F17200 Nicotine dependence, unspecified, uncomplicated: Secondary | ICD-10-CM | POA: Diagnosis present

## 2023-02-24 DIAGNOSIS — N134 Hydroureter: Secondary | ICD-10-CM | POA: Diagnosis not present

## 2023-02-24 DIAGNOSIS — Z720 Tobacco use: Secondary | ICD-10-CM | POA: Diagnosis present

## 2023-02-24 DIAGNOSIS — E890 Postprocedural hypothyroidism: Secondary | ICD-10-CM | POA: Diagnosis not present

## 2023-02-24 DIAGNOSIS — E1165 Type 2 diabetes mellitus with hyperglycemia: Secondary | ICD-10-CM | POA: Diagnosis present

## 2023-02-24 DIAGNOSIS — N209 Urinary calculus, unspecified: Secondary | ICD-10-CM | POA: Diagnosis present

## 2023-02-24 DIAGNOSIS — I10 Essential (primary) hypertension: Secondary | ICD-10-CM | POA: Diagnosis not present

## 2023-02-24 LAB — URINALYSIS, ROUTINE W REFLEX MICROSCOPIC
Bilirubin Urine: NEGATIVE
Glucose, UA: NEGATIVE mg/dL
Ketones, ur: NEGATIVE mg/dL
Nitrite: NEGATIVE
Protein, ur: NEGATIVE mg/dL
Specific Gravity, Urine: 1.011 (ref 1.005–1.030)
pH: 5 (ref 5.0–8.0)

## 2023-02-24 LAB — CBC WITH DIFFERENTIAL/PLATELET
Abs Immature Granulocytes: 0.05 10*3/uL (ref 0.00–0.07)
Basophils Absolute: 0 10*3/uL (ref 0.0–0.1)
Basophils Relative: 0 %
Eosinophils Absolute: 0.2 10*3/uL (ref 0.0–0.5)
Eosinophils Relative: 2 %
HCT: 38.6 % — ABNORMAL LOW (ref 39.0–52.0)
Hemoglobin: 12.8 g/dL — ABNORMAL LOW (ref 13.0–17.0)
Immature Granulocytes: 1 %
Lymphocytes Relative: 27 %
Lymphs Abs: 2.6 10*3/uL (ref 0.7–4.0)
MCH: 30 pg (ref 26.0–34.0)
MCHC: 33.2 g/dL (ref 30.0–36.0)
MCV: 90.6 fL (ref 80.0–100.0)
Monocytes Absolute: 0.5 10*3/uL (ref 0.1–1.0)
Monocytes Relative: 5 %
Neutro Abs: 6.1 10*3/uL (ref 1.7–7.7)
Neutrophils Relative %: 65 %
Platelets: 246 10*3/uL (ref 150–400)
RBC: 4.26 MIL/uL (ref 4.22–5.81)
RDW: 13.2 % (ref 11.5–15.5)
WBC: 9.5 10*3/uL (ref 4.0–10.5)
nRBC: 0 % (ref 0.0–0.2)

## 2023-02-24 LAB — COMPREHENSIVE METABOLIC PANEL
ALT: 19 U/L (ref 0–44)
AST: 16 U/L (ref 15–41)
Albumin: 3.6 g/dL (ref 3.5–5.0)
Alkaline Phosphatase: 74 U/L (ref 38–126)
Anion gap: 10 (ref 5–15)
BUN: 22 mg/dL — ABNORMAL HIGH (ref 6–20)
CO2: 23 mmol/L (ref 22–32)
Calcium: 9 mg/dL (ref 8.9–10.3)
Chloride: 108 mmol/L (ref 98–111)
Creatinine, Ser: 2.4 mg/dL — ABNORMAL HIGH (ref 0.61–1.24)
GFR, Estimated: 33 mL/min — ABNORMAL LOW (ref >60)
Glucose, Bld: 211 mg/dL — ABNORMAL HIGH (ref 70–99)
Potassium: 4.4 mmol/L (ref 3.5–5.1)
Sodium: 141 mmol/L (ref 135–145)
Total Bilirubin: 0.7 mg/dL (ref 0.3–1.2)
Total Protein: 7.3 g/dL (ref 6.5–8.1)

## 2023-02-24 LAB — TSH: TSH: 7.002 u[IU]/mL — ABNORMAL HIGH (ref 0.350–4.500)

## 2023-02-24 LAB — GLUCOSE, CAPILLARY: Glucose-Capillary: 130 mg/dL — ABNORMAL HIGH (ref 70–99)

## 2023-02-24 LAB — MRSA NEXT GEN BY PCR, NASAL: MRSA by PCR Next Gen: NOT DETECTED

## 2023-02-24 LAB — T4, FREE: Free T4: 1.21 ng/dL — ABNORMAL HIGH (ref 0.61–1.12)

## 2023-02-24 MED ORDER — CIPROFLOXACIN IN D5W 400 MG/200ML IV SOLN
400.0000 mg | Freq: Two times a day (BID) | INTRAVENOUS | Status: DC
  Administered 2023-02-24 – 2023-02-26 (×4): 400 mg via INTRAVENOUS
  Filled 2023-02-24 (×4): qty 200

## 2023-02-24 MED ORDER — HYDRALAZINE HCL 20 MG/ML IJ SOLN
10.0000 mg | INTRAMUSCULAR | Status: AC
  Administered 2023-02-24: 10 mg via INTRAVENOUS
  Filled 2023-02-24: qty 1

## 2023-02-24 MED ORDER — TAMSULOSIN HCL 0.4 MG PO CAPS
0.4000 mg | ORAL_CAPSULE | Freq: Every day | ORAL | Status: DC
  Administered 2023-02-25 – 2023-02-26 (×2): 0.4 mg via ORAL
  Filled 2023-02-24 (×2): qty 1

## 2023-02-24 MED ORDER — ACETAMINOPHEN 650 MG RE SUPP: 650.0000 mg | Freq: Four times a day (QID) | RECTAL | Status: DC | PRN

## 2023-02-24 MED ORDER — ONDANSETRON HCL 4 MG/2ML IJ SOLN: 4.0000 mg | Freq: Four times a day (QID) | INTRAMUSCULAR | Status: DC | PRN

## 2023-02-24 MED ORDER — ACETAMINOPHEN 325 MG PO TABS
650.0000 mg | ORAL_TABLET | Freq: Four times a day (QID) | ORAL | Status: DC | PRN
  Administered 2023-02-24: 650 mg via ORAL
  Filled 2023-02-24: qty 2

## 2023-02-24 MED ORDER — INSULIN ASPART 100 UNIT/ML IJ SOLN
0.0000 [IU] | Freq: Three times a day (TID) | INTRAMUSCULAR | Status: DC
  Administered 2023-02-24: 2 [IU] via SUBCUTANEOUS
  Administered 2023-02-25: 8 [IU] via SUBCUTANEOUS
  Administered 2023-02-25: 3 [IU] via SUBCUTANEOUS
  Administered 2023-02-25: 2 [IU] via SUBCUTANEOUS
  Administered 2023-02-26: 8 [IU] via SUBCUTANEOUS
  Administered 2023-02-26: 3 [IU] via SUBCUTANEOUS
  Administered 2023-02-26: 8 [IU] via SUBCUTANEOUS
  Filled 2023-02-24: qty 0.15

## 2023-02-24 MED ORDER — MAGNESIUM OXIDE -MG SUPPLEMENT 400 (240 MG) MG PO TABS
400.0000 mg | ORAL_TABLET | Freq: Every day | ORAL | Status: DC
  Administered 2023-02-24 – 2023-02-25 (×2): 400 mg via ORAL
  Filled 2023-02-24 (×2): qty 1

## 2023-02-24 MED ORDER — OXYCODONE HCL 5 MG PO TABS: 5.0000 mg | ORAL_TABLET | Freq: Four times a day (QID) | ORAL | Status: DC | PRN

## 2023-02-24 MED ORDER — AMLODIPINE BESYLATE 10 MG PO TABS
5.0000 mg | ORAL_TABLET | Freq: Every day | ORAL | Status: DC
  Administered 2023-02-24 – 2023-02-26 (×3): 5 mg via ORAL
  Filled 2023-02-24 (×3): qty 1

## 2023-02-24 MED ORDER — CARVEDILOL 12.5 MG PO TABS
12.5000 mg | ORAL_TABLET | Freq: Two times a day (BID) | ORAL | Status: DC
  Administered 2023-02-24 – 2023-02-26 (×5): 12.5 mg via ORAL
  Filled 2023-02-24 (×5): qty 1

## 2023-02-24 MED ORDER — LEVOTHYROXINE SODIUM 100 MCG PO TABS
200.0000 ug | ORAL_TABLET | Freq: Every day | ORAL | Status: DC
  Administered 2023-02-25 – 2023-02-26 (×2): 200 ug via ORAL
  Filled 2023-02-24 (×2): qty 2

## 2023-02-24 MED ORDER — SODIUM CHLORIDE 0.45 % IV SOLN: INTRAVENOUS | Status: DC

## 2023-02-24 MED ORDER — ONDANSETRON HCL 4 MG PO TABS: 4.0000 mg | ORAL_TABLET | Freq: Four times a day (QID) | ORAL | Status: DC | PRN

## 2023-02-24 MED ORDER — LABETALOL HCL 5 MG/ML IV SOLN
20.0000 mg | INTRAVENOUS | Status: DC | PRN
  Administered 2023-02-24: 20 mg via INTRAVENOUS
  Filled 2023-02-24: qty 4

## 2023-02-24 MED ORDER — HYDROMORPHONE HCL 1 MG/ML IJ SOLN
1.0000 mg | INTRAMUSCULAR | Status: DC | PRN
  Administered 2023-02-25 (×2): 1 mg via INTRAVENOUS
  Filled 2023-02-24 (×2): qty 1

## 2023-02-24 MED ORDER — LABETALOL HCL 5 MG/ML IV SOLN
20.0000 mg | INTRAVENOUS | Status: DC | PRN
  Administered 2023-02-24 – 2023-02-25 (×4): 20 mg via INTRAVENOUS
  Filled 2023-02-24 (×4): qty 4

## 2023-02-24 MED ORDER — HYDRALAZINE HCL 25 MG PO TABS
25.0000 mg | ORAL_TABLET | Freq: Three times a day (TID) | ORAL | Status: DC
  Administered 2023-02-24 – 2023-02-26 (×7): 25 mg via ORAL
  Filled 2023-02-24 (×7): qty 1

## 2023-02-24 NOTE — ED Notes (Signed)
MD Robb Matar at bedside speaking with patient. Pt asymptomatic at this time, denies headache, dizziness or any other symptoms at this time.

## 2023-02-24 NOTE — ED Notes (Signed)
ED TO INPATIENT HANDOFF REPORT  ED Nurse Name and Phone #: Rebecca Eaton RN  S Name/Age/Gender Tyler [name] 47 y.o. male Room/Bed: WA15/WA15  Code Status   Code Status: Prior  Home/SNF/Other Home Patient oriented to: self, place, time, and situation Is this baseline? Yes   Triage Complete: Triage complete  Chief Complaint Hypertensive urgency [I16.0]  Triage Note No notes on file   Allergies Allergies  Allergen Reactions   Isosorbide Other (See Comments)    Lowers the blood pressure too much    Level of Care/Admitting Diagnosis ED Disposition     ED Disposition  Admit   Condition  --   Comment  Hospital Area: [facility] [[id number]]  Level of Care: Stepdown [14]  Admit to SDU based on following criteria: Hemodynamic compromise or significant risk of instability:  Patient requiring short term acute titration and management of vasoactive drips, and invasive monitoring (i.e., CVP and Arterial line).  May place patient in observation at [facility] or [facility] if equivalent level of care is available:: No  Covid Evaluation: Asymptomatic - no recent exposure (last 10 days) testing not required  Diagnosis: Hypertensive urgency [867619]  Admitting Physician: Bobette Mo [[id number]]  Attending Physician: Bobette Mo [[id number]]          B Medical/Surgery History Past Medical History:  Diagnosis Date   Arthritis    Cardiomyopathy (HCC)    CHF (congestive heart failure) (HCC)    Complication of anesthesia    Woke up during surgery   Coronary artery disease    Diabetes (HCC)    Gout    High cholesterol    Hypertension    Low testosterone 04/17/2021   Malignant neoplasm of thyroid gland (HCC) 04/17/2016   Pneumonia    Postsurgical hypothyroidism    thyroid cancer   Sleep apnea    Vitamin D insufficiency    Past Surgical History:  Procedure Laterality Date   EYE SURGERY Left    cataract removal   FRACTURE SURGERY Left  06/23/1987   ankle   THYROIDECTOMY N/A 03/03/2013   Procedure: TOTAL THYROIDECTOMY;  Surgeon: Dalia Heading, MD;  Location: [facility];  Service: General;  Laterality: N/A;   TOTAL HIP ARTHROPLASTY Right 07/28/2021   TOTAL HIP ARTHROPLASTY Left 09/08/2022   Procedure: TOTAL HIP ARTHROPLASTY ANTERIOR APPROACH;  Surgeon: Durene Romans, MD;  Location: [facility];  Service: Orthopedics;  Laterality: Left;     A IV Location/Drains/Wounds Patient Lines/Drains/Airways Status     Active Line/Drains/Airways     Name Placement date Placement time Site Days   Peripheral IV 02/24/23 20 G 1.75" Left Antecubital 02/24/23  1238  Antecubital  less than 1            Intake/Output Last 24 hours No intake or output data in the 24 hours ending 02/24/23 1648  Labs/Imaging Results for orders placed or performed during the hospital encounter of 02/24/23 (from the past 48 hour(s))  Urinalysis, Routine w reflex microscopic -Urine, Clean Catch     Status: Abnormal   Collection Time: 02/24/23 11:52 AM  Result Value Ref Range   Color, Urine YELLOW YELLOW   APPearance CLEAR CLEAR   Specific Gravity, Urine 1.011 1.005 - 1.030   pH 5.0 5.0 - 8.0   Glucose, UA NEGATIVE NEGATIVE mg/dL   Hgb urine dipstick MODERATE (A) NEGATIVE   Bilirubin Urine NEGATIVE NEGATIVE   Ketones, ur NEGATIVE NEGATIVE mg/dL   Protein, ur NEGATIVE NEGATIVE mg/dL  Nitrite NEGATIVE NEGATIVE   Leukocytes,Ua SMALL (A) NEGATIVE   RBC / HPF 21-50 0 - 5 RBC/hpf   WBC, UA 21-50 0 - 5 WBC/hpf   Bacteria, UA RARE (A) NONE SEEN   Squamous Epithelial / HPF 0-5 0 - 5 /HPF   Mucus PRESENT     Comment: Performed at [facility], [address], [place], [place] [address]  Comprehensive metabolic panel     Status: Abnormal   Collection Time: 02/24/23 12:39 PM  Result Value Ref Range   Sodium 141 135 - 145 mmol/L   Potassium 4.4 3.5 - 5.1 mmol/L   Chloride 108 98 - 111 mmol/L   CO2 23 22 - 32 mmol/L   Glucose, Bld 211 (H)  70 - 99 mg/dL    Comment: Glucose reference range applies only to samples taken after fasting for at least 8 hours.   BUN 22 (H) 6 - 20 mg/dL   Creatinine, Ser 2.84 (H) 0.61 - 1.24 mg/dL   Calcium 9.0 8.9 - 13.2 mg/dL   Total Protein 7.3 6.5 - 8.1 g/dL   Albumin 3.6 3.5 - 5.0 g/dL   AST 16 15 - 41 U/L   ALT 19 0 - 44 U/L   Alkaline Phosphatase 74 38 - 126 U/L   Total Bilirubin 0.7 0.3 - 1.2 mg/dL   GFR, Estimated 33 (L) >60 mL/min    Comment: (NOTE) Calculated using the CKD-EPI Creatinine Equation (2021)    Anion gap 10 5 - 15    Comment: Performed at [facility], [address] [place], [place] [address]  CBC with Differential     Status: Abnormal   Collection Time: 02/24/23 12:39 PM  Result Value Ref Range   WBC 9.5 4.0 - 10.5 K/uL   RBC 4.26 4.22 - 5.81 MIL/uL   Hemoglobin 12.8 (L) 13.0 - 17.0 g/dL   HCT 27.2 (L) 53.6 - 64.4 %   MCV 90.6 80.0 - 100.0 fL   MCH 30.0 26.0 - 34.0 pg   MCHC 33.2 30.0 - 36.0 g/dL   RDW 03.4 74.2 - 59.5 %   Platelets 246 150 - 400 K/uL   nRBC 0.0 0.0 - 0.2 %   Neutrophils Relative % 65 %   Neutro Abs 6.1 1.7 - 7.7 K/uL   Lymphocytes Relative 27 %   Lymphs Abs 2.6 0.7 - 4.0 K/uL   Monocytes Relative 5 %   Monocytes Absolute 0.5 0.1 - 1.0 K/uL   Eosinophils Relative 2 %   Eosinophils Absolute 0.2 0.0 - 0.5 K/uL   Basophils Relative 0 %   Basophils Absolute 0.0 0.0 - 0.1 K/uL   Immature Granulocytes 1 %   Abs Immature Granulocytes 0.05 0.00 - 0.07 K/uL    Comment: Performed at [facility], [address] [place], [place] [address]  TSH     Status: Abnormal   Collection Time: 02/24/23 12:39 PM  Result Value Ref Range   TSH 7.002 (H) 0.350 - 4.500 uIU/mL    Comment: Performed by a 3rd Generation assay with a functional sensitivity of <=0.01 uIU/mL. Performed at [facility], [address] [place], [place] [address]    CT Renal Stone Study  Result Date: 02/24/2023 CLINICAL DATA:   Flank pain. EXAM: CT ABDOMEN AND PELVIS WITHOUT CONTRAST TECHNIQUE: Multidetector CT imaging of the abdomen and pelvis was performed following the standard protocol without IV contrast. RADIATION DOSE REDUCTION: This exam was performed according to  the departmental dose-optimization program which includes automated exposure control, adjustment of the mA and/or kV according to patient size and/or use of iterative reconstruction technique. COMPARISON:  CT 02/18/2023 FINDINGS: Lower chest: Mild lingular atelectasis or scar. No pleural effusion lung bases. Coronary artery calcifications are seen. Hepatobiliary: Fatty liver infiltration.  Gallbladder is contracted. Pancreas: Unremarkable. No pancreatic ductal dilatation or surrounding inflammatory changes. Spleen: Normal in size without focal abnormality. Adrenals/Urinary Tract: The adrenal glands are preserved. Persistent mild right-sided renal collecting system dilatation. Punctate nonobstructing lower pole right-sided renal stone. Previously there is a small stone at the right distal ureter just proximal to the UVJ. There is suggestion of a stone this location again but this areas obscured by the extensive streak artifact from the bilateral hip arthroplasties. However there is new left-sided collecting system dilatation with what appears to be a stone in the extreme distal left ureter on series 2, image 90. Previously there was a 4 mm stone in the left kidney which is no longer seen in the left kidney today. There are some separate punctate nonobstructing lower pole left-sided stones. Again the bladder is obscured. What is seen has some slight wall thickening and stranding. Stomach/Bowel: Large bowel has a normal course and caliber with scattered stool. Normal appendix. Moderate fluid and debris in the stomach. The small bowel is nondilated. Vascular/Lymphatic: Aortic atherosclerosis. No enlarged abdominal or pelvic lymph nodes. Reproductive: Prostate is obscured.  Other: No free air or free fluid. Musculoskeletal: Degenerative changes along the spine and pelvis. Multilevel disc bulging along the lower lumbar spine with some stenosis. Trace retrolisthesis of L4-5 and L5-S1. Again bilateral hip arthroplasties are seen with significant streak artifact obscuring significant portions of the pelvis. IMPRESSION: Persistent right-sided renal collecting system mild dilatation. The distal right ureteral stone is felt to be present but the bladder and distal ureters are poorly seen with the extensive streak artifact from the bilateral hip arthroplasties. However there is new collecting system dilatation of the left kidney and ureter with some perinephric stranding. The previous 4 mm stone in the left renal pelvis is now no longer identified. Please correlate for the stone being in the distal ureter. In the setting of potential distal bilateral ureteral obstruction, please correlate with patient's renal function and consider urologic consultation Slight wall thickening of the visualized portions of the urinary bladder with stranding. Fatty liver infiltration. Electronically Signed   By: Karen Kays M.D.   On: 02/24/2023 15:05    Pending Labs Unresulted Labs (From admission, onward)     Start     Ordered   02/24/23 1152  T4, free  Once,   URGENT        02/24/23 1151            Vitals/Pain Today's Vitals   02/24/23 1426 02/24/23 1500 02/24/23 1600 02/24/23 1630  BP: (!) 188/142 (!) 183/153 (!) 194/132 (!) 195/137  Pulse: 72 74 71 74  Resp: 19 19 17 16   Temp:      TempSrc:      SpO2: 100% 99%  100%  Weight:      Height:      PainSc:        Isolation Precautions No active isolations  Medications Medications  labetalol (NORMODYNE) injection 20 mg (20 mg Intravenous Given 02/24/23 1349)  hydrALAZINE (APRESOLINE) injection 10 mg (10 mg Intravenous Given 02/24/23 1637)    Mobility walks     Focused Assessments     R Recommendations: See Admitting  Provider  Note  Report given to:   Additional Notes:

## 2023-02-24 NOTE — H&P (View-Only) (Signed)
Urology Consult  Referring physician: Gerhard Munch Reason for referral: ureteral stones  Chief Complaint: ureteral stones  History of Present Illness: Patient was recently in the hospital discharged August 31.  He has a significant cardiac history with cardiomyopathy and congestive heart failure.  He was initially seen August 29.  He was having right flank pain and hematuria.  He had a distal right ureteral stone with right hydronephrosis and he had a 3-4 mm stones in the other kidney nonobstructing.  Serum creatinine was 2.0 with a baseline of 1.0.  It was felt that he would pass the stone.  Urine culture August 29 was normal.  Serum creatinine in August 30 was 1.84 when it was as high as 2.23.  In the past has been 0.8-1.04.  He was discharged August 31 with urology follow-up.  The repeat renal ultrasound demonstrated no hydronephrosis.  Patient came into the emergency room room today with hypertension.  He has multiple readings over 180 systolic and 200 systolic.  He was having a headache.  He said because his serum creatinine was elevated from the last admission they had held his blood pressure pills.  He has been taking Flomax and making urine.  He was having a headache off and on for more than 24 hours.  He can void every 20 minutes and every 2 hours.  Flow was reasonable.  He is on Flomax.  He says he is making urine.  He has never had a kidney stone before.  Patient had a CT scan today which shows persistent mild right sided collecting system dilation.  The stone may still be present in the distal right ureter but is difficult to see on both CT scans due to streak artifact from the prosthesis.  There is new left-sided dilation of the left ureter from the distal left stone.  The 4 mm stone initially in the left kidney from the last CT is no longer present.  Hemoglobin 12.8.  White blood count 9.5.  Serum creatinine 2.40.  4 days ago was 1.66 Urinalysis demonstrated red blood cells in urine  and rare bacteria  Past Medical History:  Diagnosis Date   Arthritis    Cardiomyopathy (HCC)    CHF (congestive heart failure) (HCC)    Complication of anesthesia    Woke up during surgery   Coronary artery disease    Diabetes (HCC)    Gout    High cholesterol    Hypertension    Low testosterone 04/17/2021   Malignant neoplasm of thyroid gland (HCC) 04/17/2016   Pneumonia    Postsurgical hypothyroidism    thyroid cancer   Sleep apnea    Vitamin D insufficiency    Past Surgical History:  Procedure Laterality Date   EYE SURGERY Left    cataract removal   FRACTURE SURGERY Left 06/23/1987   ankle   THYROIDECTOMY N/A 03/03/2013   Procedure: TOTAL THYROIDECTOMY;  Surgeon: Dalia Heading, MD;  Location: [facility];  Service: General;  Laterality: N/A;   TOTAL HIP ARTHROPLASTY Right 07/28/2021   TOTAL HIP ARTHROPLASTY Left 09/08/2022   Procedure: TOTAL HIP ARTHROPLASTY ANTERIOR APPROACH;  Surgeon: Durene Romans, MD;  Location: [facility];  Service: Orthopedics;  Laterality: Left;    Medications: I have reviewed the patient's current medications. Allergies:  Allergies  Allergen Reactions   Isosorbide Other (See Comments)    Lowers the blood pressure too much    Family History  Problem Relation Age of Onset   Hypertension Father  Hyperlipidemia Father    Social History:  reports that he has been smoking cigarettes. He has a 5 pack-year smoking history. He has quit using smokeless tobacco. He reports current alcohol use. He reports that he does not use drugs.  ROS: All systems are reviewed and negative except as noted. Rest negative  Physical Exam:  Vital signs in last 24 hours: Temp:  [97.6 F (36.4 C)-98 F (36.7 C)] 98 F (36.7 C) (09/04 1309) Pulse Rate:  [66-82] 72 (09/04 1426) Resp:  [16-19] 19 (09/04 1426) BP: (181-200)/(130-142) 188/142 (09/04 1426) SpO2:  [98 %-100 %] 100 % (09/04 1426) Weight:  [127 kg] 127 kg (09/04 1145)  Cardiovascular: Skin warm; not  flushed Respiratory: Breaths quiet; no shortness of breath Abdomen: No masses Neurological: Normal sensation to touch Musculoskeletal: Normal motor function arms and legs Lymphatics: No inguinal adenopathy Skin: No rashes Genitourinary:rest negative  Laboratory Data:  Results for orders placed or performed during the hospital encounter of 02/24/23 (from the past 72 hour(s))  Urinalysis, Routine w reflex microscopic -Urine, Clean Catch     Status: Abnormal   Collection Time: 02/24/23 11:52 AM  Result Value Ref Range   Color, Urine YELLOW YELLOW   APPearance CLEAR CLEAR   Specific Gravity, Urine 1.011 1.005 - 1.030   pH 5.0 5.0 - 8.0   Glucose, UA NEGATIVE NEGATIVE mg/dL   Hgb urine dipstick MODERATE (A) NEGATIVE   Bilirubin Urine NEGATIVE NEGATIVE   Ketones, ur NEGATIVE NEGATIVE mg/dL   Protein, ur NEGATIVE NEGATIVE mg/dL   Nitrite NEGATIVE NEGATIVE   Leukocytes,Ua SMALL (A) NEGATIVE   RBC / HPF 21-50 0 - 5 RBC/hpf   WBC, UA 21-50 0 - 5 WBC/hpf   Bacteria, UA RARE (A) NONE SEEN   Squamous Epithelial / HPF 0-5 0 - 5 /HPF   Mucus PRESENT     Comment: Performed at [facility], [address] [place], [place] [address]  Comprehensive metabolic panel     Status: Abnormal   Collection Time: 02/24/23 12:39 PM  Result Value Ref Range   Sodium 141 135 - 145 mmol/L   Potassium 4.4 3.5 - 5.1 mmol/L   Chloride 108 98 - 111 mmol/L   CO2 23 22 - 32 mmol/L   Glucose, Bld 211 (H) 70 - 99 mg/dL    Comment: Glucose reference range applies only to samples taken after fasting for at least 8 hours.   BUN 22 (H) 6 - 20 mg/dL   Creatinine, Ser 4.78 (H) 0.61 - 1.24 mg/dL   Calcium 9.0 8.9 - 29.5 mg/dL   Total Protein 7.3 6.5 - 8.1 g/dL   Albumin 3.6 3.5 - 5.0 g/dL   AST 16 15 - 41 U/L   ALT 19 0 - 44 U/L   Alkaline Phosphatase 74 38 - 126 U/L   Total Bilirubin 0.7 0.3 - 1.2 mg/dL   GFR, Estimated 33 (L) >60 mL/min    Comment: (NOTE) Calculated using the CKD-EPI  Creatinine Equation (2021)    Anion gap 10 5 - 15    Comment: Performed at [facility], [address], [place], [place] [address]  CBC with Differential     Status: Abnormal   Collection Time: 02/24/23 12:39 PM  Result Value Ref Range   WBC 9.5 4.0 - 10.5 K/uL   RBC 4.26 4.22 - 5.81 MIL/uL   Hemoglobin 12.8 (L) 13.0 - 17.0 g/dL   HCT 86.5 (L) 78.4 - 69.6 %   MCV  90.6 80.0 - 100.0 fL   MCH 30.0 26.0 - 34.0 pg   MCHC 33.2 30.0 - 36.0 g/dL   RDW 96.0 45.4 - 09.8 %   Platelets 246 150 - 400 K/uL   nRBC 0.0 0.0 - 0.2 %   Neutrophils Relative % 65 %   Neutro Abs 6.1 1.7 - 7.7 K/uL   Lymphocytes Relative 27 %   Lymphs Abs 2.6 0.7 - 4.0 K/uL   Monocytes Relative 5 %   Monocytes Absolute 0.5 0.1 - 1.0 K/uL   Eosinophils Relative 2 %   Eosinophils Absolute 0.2 0.0 - 0.5 K/uL   Basophils Relative 0 %   Basophils Absolute 0.0 0.0 - 0.1 K/uL   Immature Granulocytes 1 %   Abs Immature Granulocytes 0.05 0.00 - 0.07 K/uL    Comment: Performed at [facility], [address] [place], [place] [address]  TSH     Status: Abnormal   Collection Time: 02/24/23 12:39 PM  Result Value Ref Range   TSH 7.002 (H) 0.350 - 4.500 uIU/mL    Comment: Performed by a 3rd Generation assay with a functional sensitivity of <=0.01 uIU/mL. Performed at [facility], [address], [place], [place] [address]    Recent Results (from the past 240 hour(s))  Urine Culture     Status: None   Collection Time: 02/18/23  5:54 PM   Specimen: Urine, Clean Catch  Result Value Ref Range Status   Specimen Description   Final    URINE, CLEAN CATCH Performed at [facility], [address] [place], [place] [address]    Special Requests   Final    NONE Performed at [facility], [address] [place], [place] [address]    Culture   Final    NO GROWTH Performed at [facility], [address] [place], [place] [address]     Report Status 02/19/2023 FINAL  Final   Creatinine: Recent Labs    02/18/23 1530 02/18/23 1754 02/19/23 0359 02/20/23 0416 02/24/23 1239  CREATININE 2.23* 2.03* 1.84* 1.66* 2.40*    Xrays: See report/chart noted  Impression/Assessment:  Patient likely has bilateral ureteral stones that are small with an elevated serum creatinine and mild bilateral hydronephrosis.  He has very high blood pressure.  Plan:  Picture was drawn.  Patient understands that there was some artifact on the CT scan but he has bilateral hydroureter and likely bilateral stones.  I went through with stent with full pros cons risks.  Sequelae with injury and percutaneous tubes discussed.  Acuteness discussed.  He understands because of his high blood pressure he will be admitted to [facility] and timing of surgery will be dictated somewhat by this.  He had a couple water since 1130 this morning and had a sandwich or biscuit at 1130.  I spoke to emergency room physician and anesthesia.  Anesthesia did not feel that this was an emergency and I agree with them as the patient is not septic and he is stable and can be observed well overnight in the hospital.  I want to get his blood pressure under control keep him n.p.o. and put it stents tomorrow morning at 9:00 as scheduled.  I thought this was safe and better than trying to transfer him tonight to [facility] recognizing some of the barriers to that.  He will be admitted to the medicine service and his blood pressure be under control  and this will be done in the morning.  He is not having pain.  I will ask medicine to start him on some ciprofloxacin broad-spectrum and asked the emergency room physician Dr. Jeraldine Loots to send urine for culture.  We will keep him n.p.o. at midnight.  Consent is signed  Lorin Picket A Shawntia Mangal 02/24/2023, 3:47 PM

## 2023-02-24 NOTE — ED Provider Notes (Signed)
Pt signed out by Dr. Jeraldine Loots at shift change.  Dr. Robb Matar ([facility]) will admit.  Dr. Sherron Monday plans for surgery tomorrow after his BP has been more controlled.     Jacalyn Lefevre, MD 02/24/23 [id number]

## 2023-02-24 NOTE — Transitions of Care (Post Inpatient/ED Visit) (Signed)
   02/24/2023  Name: Tyler [name] MRN: [id number] DOB: 1975/08/07  Today's TOC FU Call Status: Today's TOC FU Call Status:: Successful TOC FU Call Completed TOC FU Call Complete Date: 02/24/23 Patient's Name and Date of Birth confirmed.  Transition Care Management Follow-up Telephone Call Date of Discharge: 02/20/23 Discharge Facility: [facility]) Type of Discharge: Inpatient Admission Primary Inpatient Discharge Diagnosis:: Acute Kidney Injury How have you been since you were released from the hospital?: Worse (Patient is currenlty on the way back to the hospital for elevated BP.) Any questions or concerns?: No Items Reviewed: Patient notes he is being readmitted to hospital as he cannot get his blood pressure under control.    Medications Reviewed Today: Medications Reviewed Today   Medications were not reviewed in this encounter    Plan to follow up with patient after discharge from the hospital.    Jodelle Gross RN, BSN, CCM [facility] RN Care Coordinator/ Transitions of Care Direct Dial: [phone]  Fax: [phone]

## 2023-02-24 NOTE — H&P (Addendum)
History and Physical    Patient: Tyler [name] WNU:[id number] DOB: 1975/12/02 DOA: 02/24/2023 DOS: the patient was seen and examined on 02/24/2023 PCP: Sonny Masters, FNP  Patient coming from: Home  Chief Complaint:  Chief Complaint  Patient presents with   Hypertension   HPI: Tyler [name] is a 47 y.o. male with medical history significant of osteoarthritis, nonischemic cardiomyopathy, chronic combined systolic/diastolic CHF, CAD, type 2 diabetes, gout, hyperlipidemia, hypertension, hypogonadism, thyroid cancer, postsurgical hypothyroidism, history of pneumonia, sleep apnea, vitamin D deficiency who was recent admitted and discharged from 02/18/2023 until 02/20/2023 due to AKI in the setting of right ureteral stone who is returning to the emergency department due to uncontrolled hypertension, right flank pain and hematuria. He denied fever, chills, rhinorrhea, sore throat, wheezing or hemoptysis.  No chest pain, palpitations, diaphoresis, PND, orthopnea or recent pitting edema of the lower extremities.  No abdominal pain, nausea, emesis, diarrhea, constipation, melena or hematochezia.  No polyuria, polydipsia, polyphagia or blurred vision.   Lab work: His urine analysis showed moderate hemoglobin, small leukocyte esterase and rare bacteria.  CBC with a white count of 9.5, hemoglobin 12.8 g/dL and platelets 034.  TSH 7.002 IU/mL.  CMP showed a glucose of 211, BUN 22 and creatinine 2.40 mg/dL.  The rest of the CMP measurement was normal.  Most recent creatinine level was 1.66 mg/dL 4 days ago.  Imaging: CT renal study showed persistent right-sided renal collecting system mild dilatation.  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  However there is a new Cholestin system dilatation of the left kidney and ureter with some perinephric stranding.  The previous 4  mm stone in the left renal pelvis is no longer identified.  Fatty liver infiltration.   ED course: Initial vital signs were temperature 97.6 F, pulse 82, respirations 16, BP 181/130 mmHg O2 sat 100% on room air.  The patient received hydralazine 10 mg IVP and labetalol 20 mg x 3 doses as needed was ordered.  He was seen by urology (Dr. Nino Parsley).  Of  Review of Systems: As mentioned in the history of present illness. All other systems reviewed and are negative. Past Medical History:  Diagnosis Date   Arthritis    Cardiomyopathy [facility])    CHF (congestive heart failure) (HCC)    Complication of anesthesia    Woke up during surgery   Coronary artery disease    Diabetes (HCC)    Gout    High cholesterol    Hypertension    Low testosterone 04/17/2021   Malignant neoplasm of thyroid gland (HCC) 04/17/2016   Pneumonia    Postsurgical hypothyroidism    thyroid cancer   Sleep apnea    Vitamin D insufficiency    Past Surgical History:  Procedure Laterality Date   EYE SURGERY Left    cataract removal   FRACTURE SURGERY Left 06/23/1987   ankle   THYROIDECTOMY N/A 03/03/2013   Procedure: TOTAL THYROIDECTOMY;  Surgeon: Dalia Heading, MD;  Location: [facility];  Service: General;  Laterality: N/A;   TOTAL HIP ARTHROPLASTY Right 07/28/2021   TOTAL HIP ARTHROPLASTY Left 09/08/2022   Procedure: TOTAL HIP ARTHROPLASTY ANTERIOR APPROACH;  Surgeon: Durene Romans, MD;  Location: [facility];  Service: Orthopedics;  Laterality: Left;   Social History:  reports that he has been smoking cigarettes. He has a 5 pack-year smoking history.  He has quit using smokeless tobacco. He reports current alcohol use. He reports that he does not use drugs.  Allergies  Allergen Reactions   Isosorbide Other (See Comments)    Lowers the blood pressure too much    Family History  Problem Relation Age of Onset   Hypertension Father    Hyperlipidemia Father     Prior to Admission medications   Medication Sig Start  Date End Date Taking? Authorizing Provider  amLODipine (NORVASC) 5 MG tablet Take 5 mg by mouth daily.   Yes [provider]  aspirin EC 81 MG tablet Take 81 mg by mouth daily. 12/07/22  Yes [provider]  carvedilol (COREG) 12.5 MG tablet Take 1 tablet (12.5 mg total) by mouth 2 (two) times daily with a meal. 02/20/23  Yes Gonfa, Taye T, MD  celecoxib (CELEBREX) 200 MG capsule Take 200 mg by mouth daily.   Yes [provider]  colchicine 0.6 MG tablet TAKE 2 TABLETS AT ONSET, THEN 1 TABLET 2 HOURS LATER- A MAXIMUM OF 3 TABLETS IN 24 HOURS Patient taking differently: Take 0.6-1.2 mg by mouth See admin instructions. TAKE 1.2 mg (2 TABLETS) by mouth AT ONSET OF GOUT FLARE, THEN 0.6 mg (1 TABLET) 2 HOURS LATER AS NEEDED- A MAXIMUM OF 1.8 mg (3 TABLETS IN 24 HOURS) 05/21/20  Yes Nida, Denman George, MD  hydrALAZINE (APRESOLINE) 25 MG tablet Take 25 mg by mouth 3 (three) times daily.   Yes [provider]  levothyroxine (SYNTHROID) 200 MCG tablet Take 200 mcg by mouth daily before breakfast.   Yes [provider]  magnesium oxide (MAG-OX) 400 (240 Mg) MG tablet Take 400 mg by mouth at bedtime.   Yes [provider]  methocarbamol (ROBAXIN) 500 MG tablet Take 1 tablet (500 mg total) by mouth every 6 (six) hours as needed for muscle spasms. 09/08/22  Yes Cassandria Anger, PA-C  mupirocin ointment (BACTROBAN) 2 % Apply 1 Application topically 2 (two) times daily as needed (as directed for irritation- affected areas).   Yes [provider]  nystatin (MYCOSTATIN/NYSTOP) powder Apply 1 Application topically 2 (two) times daily as needed (as directed for irritation- affected areas).   Yes [provider]  nystatin cream (MYCOSTATIN) Apply 1 Application topically 2 (two) times daily as needed (as directed for irritation- affected areas).   Yes [provider]  omega-3 acid ethyl esters (LOVAZA) 1 g capsule TAKE 2 CAPSULES BY MOUTH  TWICE A DAY Patient taking differently: Take 2 g by mouth See admin instructions. Take 2 grams by mouth in the morning and afternoon 01/12/23  Yes Rakes, Doralee Albino, FNP  oxyCODONE (OXY IR/ROXICODONE) 5 MG immediate release tablet Take 5 mg by mouth every 4 (four) hours as needed for severe pain.   Yes [provider]  tamsulosin (FLOMAX) 0.4 MG CAPS capsule Take 1 capsule (0.4 mg total) by mouth daily. 02/20/23  Yes Almon Hercules, MD  TURMERIC CURCUMIN PO Take 1,000 mg by mouth 2 (two) times daily.   Yes [provider]  TYLENOL 500 MG tablet Take 1,000 mg by mouth every 6 (six) hours as needed for mild pain or headache.   Yes [provider]  Vitamin D, Ergocalciferol, (DRISDOL) 1.25 MG (50000 UNIT) CAPS capsule Take 50,000 Units by mouth every Sunday.   Yes [provider]  ENTRESTO 97-103 MG Take 1 tablet by mouth 2 (two) times daily. Patient not taking: Reported on 02/24/2023 02/27/23   Candelaria Stagers  T, MD  metFORMIN (GLUCOPHAGE) 1000 MG tablet TAKE 1 TABLET (1,000 MG TOTAL) BY MOUTH TWICE A DAY WITH FOOD Patient not taking: Reported on 02/24/2023 11/25/22   Roma Kayser, MD  pravastatin (PRAVACHOL) 40 MG tablet TAKE 1 TABLET BY MOUTH EVERY DAY Patient not taking: Reported on 02/24/2023 06/23/22   Daryll Drown, NP    Physical Exam: Vitals:   02/24/23 1400 02/24/23 1426 02/24/23 1500 02/24/23 1600  BP: (!) 188/142 (!) 188/142 (!) 183/153 (!) 194/132  Pulse: 66 72 74 71  Resp:  19 19 17   Temp:      TempSrc:      SpO2: 98% 100% 99%   Weight:      Height:       Physical Exam Vitals and nursing note reviewed.  Constitutional:      General: He is awake. He is not in acute distress.    Appearance: Normal appearance. He is morbidly obese.  HENT:     Head: Normocephalic.     Nose: No rhinorrhea.     Mouth/Throat:     Mouth: Mucous membranes are moist.  Eyes:     General: No scleral icterus.    Pupils: Pupils are equal, round, and reactive to light.   Neck:     Vascular: No JVD.  Cardiovascular:     Rate and Rhythm: Normal rate and regular rhythm.     Heart sounds: S1 normal and S2 normal.  Pulmonary:     Effort: Pulmonary effort is normal.     Breath sounds: No wheezing, rhonchi or rales.  Abdominal:     General: Bowel sounds are normal. There is no distension.     Palpations: Abdomen is soft.     Tenderness: There is no abdominal tenderness.  Musculoskeletal:     Cervical back: Neck supple.     Right lower leg: No edema.     Left lower leg: No edema.  Skin:    General: Skin is warm and dry.  Neurological:     General: No focal deficit present.     Mental Status: He is alert and oriented to person, place, and time.  Psychiatric:        Mood and Affect: Mood normal.        Behavior: Behavior normal. Behavior is cooperative.    Data Reviewed:  Results are pending, will review when available.  04/03/2022 transthoracic echocardiogram. Impression  Aorta: The aortic root is borderline dilated- 3.7cm. The ascending aorta is mildly dilated-3.9cm. Aortic arch appears mildly dilated - 3.55cm.   Left Ventricle: There is mild global hypokinesis of the left ventricle.   Left Ventricle: Systolic function is mildly abnormal. EF: 45-49%.   Left Ventricle: There is moderate to severe concentric hypertrophy.   Left Ventricle: Doppler parameters are indeterminate for diastolic function, but Grade 1 diastolic dysfunction more likely present.   Tricuspid Valve: The right ventricular systolic pressure is normal (<36 mmHg).  No significant valvular disease was noted.  Compare to previous study from September, 2022 today's study revealed slightly improved LV EF from 40 to 45% by previous study to 45-49% by today's study.  Aortic root appears more dilated on current study. By previous study aortic root was 3.4cm. Patient has known history of nonischemic cardiomyopathy with LVEF 30 to 35% from December 2021.  EKG: Vent. rate 85  BPM PR interval 194 ms QRS duration 138 ms QT/QTcB 365/434 ms P-R-T axes 7 -49 29 Sinus rhythm slightly wider QRS compared to  prior LBBB? Abnormal ECG  Assessment and Plan: Principal Problem:   Hypertensive urgency Observation/stepdown. Optimize pain control. Continue amlodipine 5 mg p.o. daily. Continue carvedilol 12.5 mg p.o. twice daily. Continue hydralazine 25 mg p.o. daily. Sherryll Burger has been held due to AKI. -Will give extra dose of amlodipine 5 mg now.  Active Problems:   AKI (acute kidney injury) (HCC) In the setting of:   Urolithiasis  Time-limited/gentle IV fluids. Hold ARB/ACE. Avoid hypotension. Avoid nephrotoxins. Monitor intake and output. Monitor renal function and electrolytes. Scheduled for retrograde cystoscopy in AM.    Coronary artery disease involving native coronary artery of native heart without angina pectoris Continue ASA, amlodipine and carvedilol.   No longer taking pravastatin.    Chronic combined systolic and diastolic CHF (congestive heart failure) (HCC) With history of:   Nonischemic cardiomyopathy (HCC) Holding Entresto due to AKI. Continue carvedilol 12.5 mg p.o. twice daily. Check echocardiogram in the morning.    Tobacco abuse Only smoking 1 or 2 cigarettes every 2 to 3 days. Tobacco cessation advised. Nicotine replacement therapy offered but not needed.    Postsurgical hypothyroidism Levothyroxine 200 mcg p.o. daily.    Type 2 diabetes mellitus with hyperglycemia,  without long-term current use of insulin (HCC) Recent hemoglobin A1c 7.2%. Carbohydrate modified diet. No longer on metformin. CBG monitoring with [facility] while in the hospital.. Check hemoglobin A1c.    Mild sleep apnea Not on CPAP.    Morbid obesity (HCC) Current BMI 40.17 kg/m. Lifestyle modifications. Follow-up with closely PCP and/or bariatric clinic.     Advance Care Planning:   Code Status: Full Code   Consults: Urology.  Family  Communication:   Severity of Illness: The appropriate patient status for this patient is INPATIENT. Inpatient status is judged to be reasonable and necessary in order to provide the required intensity of service to ensure the patient's safety. The patient's presenting symptoms, physical exam findings, and initial radiographic and laboratory data in the context of their chronic comorbidities is felt to place them at high risk for further clinical deterioration. Furthermore, it is not anticipated that the patient will be medically stable for discharge from the hospital within 2 midnights of admission.   * I certify that at the point of admission it is my clinical judgment that the patient will require inpatient hospital care spanning beyond 2 midnights from the point of admission due to high intensity of service, high risk for further deterioration and high frequency of surveillance required.*  Author: Bobette Mo, MD 02/24/2023 4:25 PM  For on call review [email or web address].   This document was prepared using Dragon voice recognition software and may contain some unintended transcription errors.

## 2023-02-24 NOTE — ED Provider Notes (Signed)
[place] EMERGENCY DEPARTMENT AT [facility] Provider Note   CSN: [id number] Arrival date & time: 02/24/23  1136     History {Add pertinent medical, surgical, social history, OB history to HPI:1} Chief Complaint  Patient presents with   Hypertension    Tyler [name] is a 47 y.o. male.  HPI Presents with his wife who assists with the history. He presents 4 days after discharge, now with concern for hypertension. History is notable for hospitalization last week during which patient was found to have acute kidney injury.  Medications were changed for renal protective strategy.  However, today patient has had multiple readings over the 180 systolic 200 systolic.  No chest pain, no dyspnea, no syncope, no numbness weakness tingling in his extremities.     Home Medications Prior to Admission medications   Medication Sig Start Date End Date Taking? Authorizing Provider  amLODipine (NORVASC) 5 MG tablet Take 5 mg by mouth daily.   Yes [provider]  aspirin EC 81 MG tablet Take 81 mg by mouth daily. 12/07/22  Yes [provider]  carvedilol (COREG) 12.5 MG tablet Take 1 tablet (12.5 mg total) by mouth 2 (two) times daily with a meal. 02/20/23  Yes Gonfa, Taye T, MD  celecoxib (CELEBREX) 200 MG capsule Take 200 mg by mouth daily.   Yes [provider]  colchicine 0.6 MG tablet TAKE 2 TABLETS AT ONSET, THEN 1 TABLET 2 HOURS LATER- A MAXIMUM OF 3 TABLETS IN 24 HOURS Patient taking differently: Take 0.6-1.2 mg by mouth See admin instructions. TAKE 1.2 mg (2 TABLETS) by mouth AT ONSET OF GOUT FLARE, THEN 0.6 mg (1 TABLET) 2 HOURS LATER AS NEEDED- A MAXIMUM OF 1.8 mg (3 TABLETS IN 24 HOURS) 05/21/20  Yes Nida, Denman George, MD  hydrALAZINE (APRESOLINE) 25 MG tablet Take 25 mg by mouth 3 (three) times daily.   Yes [provider]  levothyroxine (SYNTHROID) 200 MCG tablet Take 200 mcg by mouth daily before breakfast.   Yes [provider]   magnesium oxide (MAG-OX) 400 (240 Mg) MG tablet Take 400 mg by mouth at bedtime.   Yes [provider]  methocarbamol (ROBAXIN) 500 MG tablet Take 1 tablet (500 mg total) by mouth every 6 (six) hours as needed for muscle spasms. 09/08/22  Yes Cassandria Anger, PA-C  mupirocin ointment (BACTROBAN) 2 % Apply 1 Application topically 2 (two) times daily as needed (as directed for irritation- affected areas).   Yes [provider]  nystatin (MYCOSTATIN/NYSTOP) powder Apply 1 Application topically 2 (two) times daily as needed (as directed for irritation- affected areas).   Yes [provider]  nystatin cream (MYCOSTATIN) Apply 1 Application topically 2 (two) times daily as needed (as directed for irritation- affected areas).   Yes [provider]  omega-3 acid ethyl esters (LOVAZA) 1 g capsule TAKE 2 CAPSULES BY MOUTH TWICE A DAY Patient taking differently: Take 2 g by mouth See admin instructions. Take 2 grams by mouth in the morning and afternoon 01/12/23  Yes Rakes, Doralee Albino, FNP  oxyCODONE (OXY IR/ROXICODONE) 5 MG immediate release tablet Take 5 mg by mouth every 4 (four) hours as needed for severe pain.   Yes [provider]  tamsulosin (FLOMAX) 0.4 MG CAPS capsule Take 1 capsule (0.4 mg total) by mouth daily. 02/20/23  Yes Almon Hercules, MD  TURMERIC CURCUMIN PO Take 1,000 mg by mouth 2 (two) times daily.   Yes [provider]  TYLENOL 500 MG tablet Take 1,000 mg by mouth every 6 (six) hours as needed for mild pain or headache.   Yes [provider]  Vitamin D, Ergocalciferol, (DRISDOL) 1.25 MG (50000 UNIT) CAPS capsule Take 50,000 Units by mouth every Sunday.   Yes [provider]  ENTRESTO 97-103 MG Take 1 tablet by mouth 2 (two) times daily. Patient not taking: Reported on 02/24/2023 02/27/23   Almon Hercules, MD  metFORMIN (GLUCOPHAGE) 1000 MG tablet TAKE 1 TABLET (1,000 MG TOTAL) BY MOUTH TWICE A DAY WITH FOOD Patient not taking:  Reported on 02/24/2023 11/25/22   Roma Kayser, MD  pravastatin (PRAVACHOL) 40 MG tablet TAKE 1 TABLET BY MOUTH EVERY DAY Patient not taking: Reported on 02/24/2023 06/23/22   Daryll Drown, NP      Allergies    Isosorbide    Review of Systems   Review of Systems  All other systems reviewed and are negative.   Physical Exam Updated Vital Signs BP (!) 195/137   Pulse 74   Temp 98 F (36.7 C) (Oral)   Resp 16   Ht 5\' 10"  (1.778 m)   Wt 127 kg   SpO2 100%   BMI 40.17 kg/m  Physical Exam Vitals and nursing note reviewed.  Constitutional:      General: He is not in acute distress.    Appearance: He is well-developed. He is obese.  HENT:     Head: Normocephalic and atraumatic.  Eyes:     Conjunctiva/sclera: Conjunctivae normal.  Cardiovascular:     Rate and Rhythm: Normal rate and regular rhythm.  Pulmonary:     Effort: Pulmonary effort is normal. No respiratory distress.     Breath sounds: No stridor.  Abdominal:     General: There is no distension.  Skin:    General: Skin is warm and dry.  Neurological:     General: No focal deficit present.     Mental Status: He is alert and oriented to person, place, and time.     Cranial Nerves: No cranial nerve deficit.     Motor: No weakness.     ED Results / Procedures / Treatments   Labs (all labs ordered are listed, but only abnormal results are displayed) Labs Reviewed  COMPREHENSIVE METABOLIC PANEL - Abnormal; Notable for the following components:      Result Value   Glucose, Bld 211 (*)    BUN 22 (*)    Creatinine, Ser 2.40 (*)    GFR, Estimated 33 (*)    All other components within normal limits  CBC WITH DIFFERENTIAL/PLATELET - Abnormal; Notable for the following components:   Hemoglobin 12.8 (*)    HCT 38.6 (*)    All other components within normal limits  URINALYSIS, ROUTINE W REFLEX MICROSCOPIC - Abnormal; Notable for the following components:   Hgb urine dipstick MODERATE (*)    Leukocytes,Ua SMALL  (*)    Bacteria, UA RARE (*)    All other components within normal limits  TSH - Abnormal; Notable for the following components:   TSH 7.002 (*)    All other components within normal limits  T4, FREE    EKG EKG Interpretation Date/Time:  Wednesday February 24 2023 11:52:53 EDT Ventricular Rate:  85 PR Interval:  194 QRS Duration:  138 QT Interval:  365 QTC Calculation: 434 R Axis:   -49  Text Interpretation: Sinus rhythm slightly widerer QRS compared to prior, poss LBBB Abnormal ECG Confirmed by Gerhard Munch (  [id number]) on 02/24/2023 12:31:59 PM  Radiology CT Renal Stone Study  Result Date: 02/24/2023 CLINICAL DATA:  Flank pain. EXAM: CT ABDOMEN AND PELVIS WITHOUT CONTRAST TECHNIQUE: Multidetector CT imaging of the abdomen and pelvis was performed following the standard protocol without IV contrast. RADIATION DOSE REDUCTION: This exam was performed according to the departmental dose-optimization program which includes automated exposure control, adjustment of the mA and/or kV according to patient size and/or use of iterative reconstruction technique. COMPARISON:  CT 02/18/2023 FINDINGS: Lower chest: Mild lingular atelectasis or scar. No pleural effusion lung bases. Coronary artery calcifications are seen. Hepatobiliary: Fatty liver infiltration.  Gallbladder is contracted. Pancreas: Unremarkable. No pancreatic ductal dilatation or surrounding inflammatory changes. Spleen: Normal in size without focal abnormality. Adrenals/Urinary Tract: The adrenal glands are preserved. Persistent mild right-sided renal collecting system dilatation. Punctate nonobstructing lower pole right-sided renal stone. Previously there is a small stone at the right distal ureter just proximal to the UVJ. There is suggestion of a stone this location again but this areas obscured by the extensive streak artifact from the bilateral hip arthroplasties. However there is new left-sided collecting system dilatation with what  appears to be a stone in the extreme distal left ureter on series 2, image 90. Previously there was a 4 mm stone in the left kidney which is no longer seen in the left kidney today. There are some separate punctate nonobstructing lower pole left-sided stones. Again the bladder is obscured. What is seen has some slight wall thickening and stranding. Stomach/Bowel: Large bowel has a normal course and caliber with scattered stool. Normal appendix. Moderate fluid and debris in the stomach. The small bowel is nondilated. Vascular/Lymphatic: Aortic atherosclerosis. No enlarged abdominal or pelvic lymph nodes. Reproductive: Prostate is obscured. Other: No free air or free fluid. Musculoskeletal: Degenerative changes along the spine and pelvis. Multilevel disc bulging along the lower lumbar spine with some stenosis. Trace retrolisthesis of L4-5 and L5-S1. Again bilateral hip arthroplasties are seen with significant streak artifact obscuring significant portions of the pelvis. IMPRESSION: Persistent right-sided renal collecting system mild dilatation. The distal right ureteral stone is felt to be present but the bladder and distal ureters are poorly seen with the extensive streak artifact from the bilateral hip arthroplasties. However there is new collecting system dilatation of the left kidney and ureter with some perinephric stranding. The previous 4 mm stone in the left renal pelvis is now no longer identified. Please correlate for the stone being in the distal ureter. In the setting of potential distal bilateral ureteral obstruction, please correlate with patient's renal function and consider urologic consultation Slight wall thickening of the visualized portions of the urinary bladder with stranding. Fatty liver infiltration. Electronically Signed   By: Karen Kays M.D.   On: 02/24/2023 15:05    Procedures Procedures  {Document cardiac monitor, telemetry assessment procedure when appropriate:1}  Medications  Ordered in ED Medications  labetalol (NORMODYNE) injection 20 mg (20 mg Intravenous Given 02/24/23 1349)  hydrALAZINE (APRESOLINE) injection 10 mg (10 mg Intravenous Given 02/24/23 1637)    ED Course/ Medical Decision Making/ A&P   {   Click here for ABCD2, HEART and other calculatorsREFRESH Note before signing :1}                              Medical Decision Making Adult male presents after recent hospitalization for kidney stone with acute kidney injury now with concern for hypertensive crisis. Initial blood pressure  180/130.  Concern for endorgan changes, labs sent, ECG ordered. Cardiac 80 sinus normal Pulse ox 100% room air normal   Amount and/or Complexity of Data Reviewed Independent Historian: spouse External Data Reviewed: notes.    Details: Hospitalization notes from last week included below Labs: ordered. Decision-making details documented in ED Course. Radiology: ordered. ECG/medicine tests: ordered and independent interpretation performed. Decision-making details documented in ED Course.  Risk Prescription drug management. Decision regarding hospitalization.  Hospital course 47 year old M with PMH of combined CHF/NICM, CAD, DM-2, morbid obesity, gout and left THA presenting with right flank pain radiating to right inguinal region and decreased p.o. intake 4 days and admitted for AKI and right ureteral stone. Cr 2.23 (baseline 0.8-1).  CT renal stone study showed punctate stone in the extreme distal right ureter just proximal to the UVJ and mild collecting system dilation proximally with perinephric stranding.  Urology consulted and recommended conservative management as long as AKI improves.    Patient's pain resolved.  AKI improved.  Renal ultrasound without residual hydronephrosis.  Urine culture NGTD.  Cleared for discharge by urology for outpatient follow-up.  Advised to hold Entresto for 1 week to allow renal recovery.  Increase Coreg to 12.5 mg twice daily for better  blood pressure control.  Encouraged to avoid NSAID and discontinued meloxicam. Update: Patient remains hypertensive after initial beta-blocker dosing. He is otherwise in no distress.  CT no available, notable for likely bilateral ureteral obstruction.  I discussed the patient's case with our urologist, Dr. Jacquelyne Balint who will see the patient.  Update: Patient's labs notable for worsening renal function and with concern for bilateral ureteral obstruction, Dr. Jacquelyne Balint will have patient for procedure, bilateral ureteral stents. I have discussed the patient's case with our hospitalist colleague, the patient has received additional antihypertensive, though expectation for decreased blood pressure without intervention as above  {Document critical care time when appropriate:1} {Document review of labs and clinical decision tools ie heart score, Chads2Vasc2 etc:1}  {Document your independent review of radiology images, and any outside records:1} {Document your discussion with family members, caretakers, and with consultants:1} {Document social determinants of health affecting pt's care:1} {Document your decision making why or why not admission, treatments were needed:1} Final Clinical Impression(s) / ED Diagnoses Final diagnoses:  None    Rx / DC Orders ED Discharge Orders     None

## 2023-02-24 NOTE — Consult Note (Signed)
Urology Consult  Referring physician: Gerhard Munch Reason for referral: ureteral stones  Chief Complaint: ureteral stones  History of Present Illness: Patient was recently in the hospital discharged August 31.  He has a significant cardiac history with cardiomyopathy and congestive heart failure.  He was initially seen August 29.  He was having right flank pain and hematuria.  He had a distal right ureteral stone with right hydronephrosis and he had a 3-4 mm stones in the other kidney nonobstructing.  Serum creatinine was 2.0 with a baseline of 1.0.  It was felt that he would pass the stone.  Urine culture August 29 was normal.  Serum creatinine in August 30 was 1.84 when it was as high as 2.23.  In the past has been 0.8-1.04.  He was discharged August 31 with urology follow-up.  The repeat renal ultrasound demonstrated no hydronephrosis.  Patient came into the emergency room room today with hypertension.  He has multiple readings over 180 systolic and 200 systolic.  He was having a headache.  He said because his serum creatinine was elevated from the last admission they had held his blood pressure pills.  He has been taking Flomax and making urine.  He was having a headache off and on for more than 24 hours.  He can void every 20 minutes and every 2 hours.  Flow was reasonable.  He is on Flomax.  He says he is making urine.  He has never had a kidney stone before.  Patient had a CT scan today which shows persistent mild right sided collecting system dilation.  The stone may still be present in the distal right ureter but is difficult to see on both CT scans due to streak artifact from the prosthesis.  There is new left-sided dilation of the left ureter from the distal left stone.  The 4 mm stone initially in the left kidney from the last CT is no longer present.  Hemoglobin 12.8.  White blood count 9.5.  Serum creatinine 2.40.  4 days ago was 1.66 Urinalysis demonstrated red blood cells in urine  and rare bacteria  Past Medical History:  Diagnosis Date   Arthritis    Cardiomyopathy (HCC)    CHF (congestive heart failure) (HCC)    Complication of anesthesia    Woke up during surgery   Coronary artery disease    Diabetes (HCC)    Gout    High cholesterol    Hypertension    Low testosterone 04/17/2021   Malignant neoplasm of thyroid gland (HCC) 04/17/2016   Pneumonia    Postsurgical hypothyroidism    thyroid cancer   Sleep apnea    Vitamin D insufficiency    Past Surgical History:  Procedure Laterality Date   EYE SURGERY Left    cataract removal   FRACTURE SURGERY Left 06/23/1987   ankle   THYROIDECTOMY N/A 03/03/2013   Procedure: TOTAL THYROIDECTOMY;  Surgeon: Dalia Heading, MD;  Location: [facility];  Service: General;  Laterality: N/A;   TOTAL HIP ARTHROPLASTY Right 07/28/2021   TOTAL HIP ARTHROPLASTY Left 09/08/2022   Procedure: TOTAL HIP ARTHROPLASTY ANTERIOR APPROACH;  Surgeon: Durene Romans, MD;  Location: [facility];  Service: Orthopedics;  Laterality: Left;    Medications: I have reviewed the patient's current medications. Allergies:  Allergies  Allergen Reactions   Isosorbide Other (See Comments)    Lowers the blood pressure too much    Family History  Problem Relation Age of Onset   Hypertension Father  Hyperlipidemia Father    Social History:  reports that he has been smoking cigarettes. He has a 5 pack-year smoking history. He has quit using smokeless tobacco. He reports current alcohol use. He reports that he does not use drugs.  ROS: All systems are reviewed and negative except as noted. Rest negative  Physical Exam:  Vital signs in last 24 hours: Temp:  [97.6 F (36.4 C)-98 F (36.7 C)] 98 F (36.7 C) (09/04 1309) Pulse Rate:  [66-82] 72 (09/04 1426) Resp:  [16-19] 19 (09/04 1426) BP: (181-200)/(130-142) 188/142 (09/04 1426) SpO2:  [98 %-100 %] 100 % (09/04 1426) Weight:  [127 kg] 127 kg (09/04 1145)  Cardiovascular: Skin warm; not  flushed Respiratory: Breaths quiet; no shortness of breath Abdomen: No masses Neurological: Normal sensation to touch Musculoskeletal: Normal motor function arms and legs Lymphatics: No inguinal adenopathy Skin: No rashes Genitourinary:rest negative  Laboratory Data:  Results for orders placed or performed during the hospital encounter of 02/24/23 (from the past 72 hour(s))  Urinalysis, Routine w reflex microscopic -Urine, Clean Catch     Status: Abnormal   Collection Time: 02/24/23 11:52 AM  Result Value Ref Range   Color, Urine YELLOW YELLOW   APPearance CLEAR CLEAR   Specific Gravity, Urine 1.011 1.005 - 1.030   pH 5.0 5.0 - 8.0   Glucose, UA NEGATIVE NEGATIVE mg/dL   Hgb urine dipstick MODERATE (A) NEGATIVE   Bilirubin Urine NEGATIVE NEGATIVE   Ketones, ur NEGATIVE NEGATIVE mg/dL   Protein, ur NEGATIVE NEGATIVE mg/dL   Nitrite NEGATIVE NEGATIVE   Leukocytes,Ua SMALL (A) NEGATIVE   RBC / HPF 21-50 0 - 5 RBC/hpf   WBC, UA 21-50 0 - 5 WBC/hpf   Bacteria, UA RARE (A) NONE SEEN   Squamous Epithelial / HPF 0-5 0 - 5 /HPF   Mucus PRESENT     Comment: Performed at [facility], [address] [place], [place] [address]  Comprehensive metabolic panel     Status: Abnormal   Collection Time: 02/24/23 12:39 PM  Result Value Ref Range   Sodium 141 135 - 145 mmol/L   Potassium 4.4 3.5 - 5.1 mmol/L   Chloride 108 98 - 111 mmol/L   CO2 23 22 - 32 mmol/L   Glucose, Bld 211 (H) 70 - 99 mg/dL    Comment: Glucose reference range applies only to samples taken after fasting for at least 8 hours.   BUN 22 (H) 6 - 20 mg/dL   Creatinine, Ser 4.78 (H) 0.61 - 1.24 mg/dL   Calcium 9.0 8.9 - 29.5 mg/dL   Total Protein 7.3 6.5 - 8.1 g/dL   Albumin 3.6 3.5 - 5.0 g/dL   AST 16 15 - 41 U/L   ALT 19 0 - 44 U/L   Alkaline Phosphatase 74 38 - 126 U/L   Total Bilirubin 0.7 0.3 - 1.2 mg/dL   GFR, Estimated 33 (L) >60 mL/min    Comment: (NOTE) Calculated using the CKD-EPI  Creatinine Equation (2021)    Anion gap 10 5 - 15    Comment: Performed at [facility], [address], [place], [place] [address]  CBC with Differential     Status: Abnormal   Collection Time: 02/24/23 12:39 PM  Result Value Ref Range   WBC 9.5 4.0 - 10.5 K/uL   RBC 4.26 4.22 - 5.81 MIL/uL   Hemoglobin 12.8 (L) 13.0 - 17.0 g/dL   HCT 86.5 (L) 78.4 - 69.6 %   MCV  90.6 80.0 - 100.0 fL   MCH 30.0 26.0 - 34.0 pg   MCHC 33.2 30.0 - 36.0 g/dL   RDW 96.0 45.4 - 09.8 %   Platelets 246 150 - 400 K/uL   nRBC 0.0 0.0 - 0.2 %   Neutrophils Relative % 65 %   Neutro Abs 6.1 1.7 - 7.7 K/uL   Lymphocytes Relative 27 %   Lymphs Abs 2.6 0.7 - 4.0 K/uL   Monocytes Relative 5 %   Monocytes Absolute 0.5 0.1 - 1.0 K/uL   Eosinophils Relative 2 %   Eosinophils Absolute 0.2 0.0 - 0.5 K/uL   Basophils Relative 0 %   Basophils Absolute 0.0 0.0 - 0.1 K/uL   Immature Granulocytes 1 %   Abs Immature Granulocytes 0.05 0.00 - 0.07 K/uL    Comment: Performed at [facility], [address] [place], [place] [address]  TSH     Status: Abnormal   Collection Time: 02/24/23 12:39 PM  Result Value Ref Range   TSH 7.002 (H) 0.350 - 4.500 uIU/mL    Comment: Performed by a 3rd Generation assay with a functional sensitivity of <=0.01 uIU/mL. Performed at [facility], [address], [place], [place] [address]    Recent Results (from the past 240 hour(s))  Urine Culture     Status: None   Collection Time: 02/18/23  5:54 PM   Specimen: Urine, Clean Catch  Result Value Ref Range Status   Specimen Description   Final    URINE, CLEAN CATCH Performed at [facility], [address] [place], [place] [address]    Special Requests   Final    NONE Performed at [facility], [address] [place], [place] [address]    Culture   Final    NO GROWTH Performed at [facility], [address] [place], [place] [address]     Report Status 02/19/2023 FINAL  Final   Creatinine: Recent Labs    02/18/23 1530 02/18/23 1754 02/19/23 0359 02/20/23 0416 02/24/23 1239  CREATININE 2.23* 2.03* 1.84* 1.66* 2.40*    Xrays: See report/chart noted  Impression/Assessment:  Patient likely has bilateral ureteral stones that are small with an elevated serum creatinine and mild bilateral hydronephrosis.  He has very high blood pressure.  Plan:  Picture was drawn.  Patient understands that there was some artifact on the CT scan but he has bilateral hydroureter and likely bilateral stones.  I went through with stent with full pros cons risks.  Sequelae with injury and percutaneous tubes discussed.  Acuteness discussed.  He understands because of his high blood pressure he will be admitted to [facility] and timing of surgery will be dictated somewhat by this.  He had a couple water since 1130 this morning and had a sandwich or biscuit at 1130.  I spoke to emergency room physician and anesthesia.  Anesthesia did not feel that this was an emergency and I agree with them as the patient is not septic and he is stable and can be observed well overnight in the hospital.  I want to get his blood pressure under control keep him n.p.o. and put it stents tomorrow morning at 9:00 as scheduled.  I thought this was safe and better than trying to transfer him tonight to [facility] recognizing some of the barriers to that.  He will be admitted to the medicine service and his blood pressure be under control  and this will be done in the morning.  He is not having pain.  I will ask medicine to start him on some ciprofloxacin broad-spectrum and asked the emergency room physician Dr. Jeraldine Loots to send urine for culture.  We will keep him n.p.o. at midnight.  Consent is signed  Lorin Picket A Shawntia Mangal 02/24/2023, 3:47 PM

## 2023-02-25 ENCOUNTER — Observation Stay (HOSPITAL_COMMUNITY): Payer: BC Managed Care – PPO

## 2023-02-25 ENCOUNTER — Encounter (HOSPITAL_COMMUNITY): Payer: Self-pay | Admitting: Internal Medicine

## 2023-02-25 ENCOUNTER — Encounter (HOSPITAL_COMMUNITY)
Admission: EM | Disposition: A | Discharge status: Home / Self Care | Payer: Self-pay | Source: Home / Self Care | Attending: Internal Medicine

## 2023-02-25 ENCOUNTER — Observation Stay (HOSPITAL_COMMUNITY): Payer: BC Managed Care – PPO | Admitting: Anesthesiology

## 2023-02-25 ENCOUNTER — Other Ambulatory Visit: Payer: Self-pay

## 2023-02-25 DIAGNOSIS — I509 Heart failure, unspecified: Secondary | ICD-10-CM | POA: Diagnosis not present

## 2023-02-25 DIAGNOSIS — E7800 Pure hypercholesterolemia, unspecified: Secondary | ICD-10-CM | POA: Diagnosis present

## 2023-02-25 DIAGNOSIS — Z6841 Body Mass Index (BMI) 40.0 and over, adult: Secondary | ICD-10-CM | POA: Diagnosis not present

## 2023-02-25 DIAGNOSIS — Z79899 Other long term (current) drug therapy: Secondary | ICD-10-CM | POA: Diagnosis not present

## 2023-02-25 DIAGNOSIS — E1165 Type 2 diabetes mellitus with hyperglycemia: Secondary | ICD-10-CM | POA: Diagnosis present

## 2023-02-25 DIAGNOSIS — G4733 Obstructive sleep apnea (adult) (pediatric): Secondary | ICD-10-CM | POA: Diagnosis present

## 2023-02-25 DIAGNOSIS — N201 Calculus of ureter: Secondary | ICD-10-CM | POA: Diagnosis not present

## 2023-02-25 DIAGNOSIS — Z8249 Family history of ischemic heart disease and other diseases of the circulatory system: Secondary | ICD-10-CM | POA: Diagnosis not present

## 2023-02-25 DIAGNOSIS — Z85850 Personal history of malignant neoplasm of thyroid: Secondary | ICD-10-CM | POA: Diagnosis not present

## 2023-02-25 DIAGNOSIS — I10 Essential (primary) hypertension: Secondary | ICD-10-CM | POA: Diagnosis present

## 2023-02-25 DIAGNOSIS — Z8701 Personal history of pneumonia (recurrent): Secondary | ICD-10-CM | POA: Diagnosis not present

## 2023-02-25 DIAGNOSIS — I160 Hypertensive urgency: Secondary | ICD-10-CM | POA: Diagnosis present

## 2023-02-25 DIAGNOSIS — I428 Other cardiomyopathies: Secondary | ICD-10-CM | POA: Diagnosis present

## 2023-02-25 DIAGNOSIS — I5042 Chronic combined systolic (congestive) and diastolic (congestive) heart failure: Secondary | ICD-10-CM

## 2023-02-25 DIAGNOSIS — E291 Testicular hypofunction: Secondary | ICD-10-CM | POA: Diagnosis present

## 2023-02-25 DIAGNOSIS — M109 Gout, unspecified: Secondary | ICD-10-CM | POA: Diagnosis present

## 2023-02-25 DIAGNOSIS — F17210 Nicotine dependence, cigarettes, uncomplicated: Secondary | ICD-10-CM | POA: Diagnosis present

## 2023-02-25 DIAGNOSIS — E890 Postprocedural hypothyroidism: Secondary | ICD-10-CM | POA: Diagnosis present

## 2023-02-25 DIAGNOSIS — E872 Acidosis, unspecified: Secondary | ICD-10-CM | POA: Diagnosis not present

## 2023-02-25 DIAGNOSIS — I2510 Atherosclerotic heart disease of native coronary artery without angina pectoris: Secondary | ICD-10-CM | POA: Diagnosis not present

## 2023-02-25 DIAGNOSIS — N179 Acute kidney failure, unspecified: Secondary | ICD-10-CM | POA: Diagnosis present

## 2023-02-25 DIAGNOSIS — I110 Hypertensive heart disease with heart failure: Secondary | ICD-10-CM | POA: Diagnosis not present

## 2023-02-25 DIAGNOSIS — Z7982 Long term (current) use of aspirin: Secondary | ICD-10-CM | POA: Diagnosis not present

## 2023-02-25 DIAGNOSIS — Z83438 Family history of other disorder of lipoprotein metabolism and other lipidemia: Secondary | ICD-10-CM | POA: Diagnosis not present

## 2023-02-25 DIAGNOSIS — Z79890 Hormone replacement therapy (postmenopausal): Secondary | ICD-10-CM | POA: Diagnosis not present

## 2023-02-25 HISTORY — PX: CYSTOSCOPY W/ URETERAL STENT PLACEMENT: SHX1429

## 2023-02-25 LAB — GLUCOSE, CAPILLARY
Glucose-Capillary: 145 mg/dL — ABNORMAL HIGH (ref 70–99)
Glucose-Capillary: 147 mg/dL — ABNORMAL HIGH (ref 70–99)
Glucose-Capillary: 151 mg/dL — ABNORMAL HIGH (ref 70–99)
Glucose-Capillary: 194 mg/dL — ABNORMAL HIGH (ref 70–99)
Glucose-Capillary: 291 mg/dL — ABNORMAL HIGH (ref 70–99)
Glucose-Capillary: 345 mg/dL — ABNORMAL HIGH (ref 70–99)

## 2023-02-25 LAB — ECHOCARDIOGRAM COMPLETE
Area-P 1/2: 4.31 cm2
Calc EF: 52.9 %
Height: 70 in
S' Lateral: 3.9 cm
Single Plane A2C EF: 62 %
Single Plane A4C EF: 43.2 %
Weight: 4479.75 [oz_av]

## 2023-02-25 LAB — CBC
HCT: 36.5 % — ABNORMAL LOW (ref 39.0–52.0)
Hemoglobin: 12 g/dL — ABNORMAL LOW (ref 13.0–17.0)
MCH: 29.9 pg (ref 26.0–34.0)
MCHC: 32.9 g/dL (ref 30.0–36.0)
MCV: 91 fL (ref 80.0–100.0)
Platelets: 234 10*3/uL (ref 150–400)
RBC: 4.01 MIL/uL — ABNORMAL LOW (ref 4.22–5.81)
RDW: 13.2 % (ref 11.5–15.5)
WBC: 10.5 10*3/uL (ref 4.0–10.5)
nRBC: 0 % (ref 0.0–0.2)

## 2023-02-25 LAB — BASIC METABOLIC PANEL
Anion gap: 11 (ref 5–15)
BUN: 23 mg/dL — ABNORMAL HIGH (ref 6–20)
CO2: 21 mmol/L — ABNORMAL LOW (ref 22–32)
Calcium: 8.2 mg/dL — ABNORMAL LOW (ref 8.9–10.3)
Chloride: 106 mmol/L (ref 98–111)
Creatinine, Ser: 2.88 mg/dL — ABNORMAL HIGH (ref 0.61–1.24)
GFR, Estimated: 27 mL/min — ABNORMAL LOW (ref >60)
Glucose, Bld: 157 mg/dL — ABNORMAL HIGH (ref 70–99)
Potassium: 4.4 mmol/L (ref 3.5–5.1)
Sodium: 138 mmol/L (ref 135–145)

## 2023-02-25 SURGERY — CYSTOSCOPY, WITH RETROGRADE PYELOGRAM AND URETERAL STENT INSERTION
Anesthesia: General | Site: Pelvis | Laterality: Bilateral

## 2023-02-25 MED ORDER — ACETAMINOPHEN 160 MG/5ML PO SOLN: 1000.0000 mg | Freq: Once | ORAL | Status: DC | PRN

## 2023-02-25 MED ORDER — LACTATED RINGERS IV SOLN
INTRAVENOUS | Status: DC | PRN
Start: 2023-02-25 — End: 2023-02-25

## 2023-02-25 MED ORDER — MIDAZOLAM HCL 2 MG/2ML IJ SOLN
INTRAMUSCULAR | Status: AC
  Filled 2023-02-25: qty 2

## 2023-02-25 MED ORDER — CHLORHEXIDINE GLUCONATE CLOTH 2 % EX PADS
6.0000 | MEDICATED_PAD | Freq: Every day | CUTANEOUS | Status: DC
  Administered 2023-02-25 – 2023-02-26 (×2): 6 via TOPICAL

## 2023-02-25 MED ORDER — EPHEDRINE SULFATE (PRESSORS) 50 MG/ML IJ SOLN
INTRAMUSCULAR | Status: DC | PRN
  Administered 2023-02-25 (×5): 10 mg via INTRAVENOUS

## 2023-02-25 MED ORDER — PROPOFOL 10 MG/ML IV BOLUS
INTRAVENOUS | Status: DC | PRN
  Administered 2023-02-25: 130 mg via INTRAVENOUS

## 2023-02-25 MED ORDER — DEXAMETHASONE SODIUM PHOSPHATE 10 MG/ML IJ SOLN
INTRAMUSCULAR | Status: DC | PRN
  Administered 2023-02-25: 5 mg via INTRAVENOUS

## 2023-02-25 MED ORDER — SODIUM CHLORIDE 0.9 % IV SOLN: INTRAVENOUS | Status: DC

## 2023-02-25 MED ORDER — ONDANSETRON HCL 4 MG/2ML IJ SOLN
INTRAMUSCULAR | Status: AC
  Filled 2023-02-25: qty 2

## 2023-02-25 MED ORDER — LACTATED RINGERS IV SOLN: INTRAVENOUS | Status: DC

## 2023-02-25 MED ORDER — DEXAMETHASONE SODIUM PHOSPHATE 10 MG/ML IJ SOLN
INTRAMUSCULAR | Status: AC
  Filled 2023-02-25: qty 1

## 2023-02-25 MED ORDER — PROPOFOL 10 MG/ML IV BOLUS
INTRAVENOUS | Status: AC
  Filled 2023-02-25: qty 20

## 2023-02-25 MED ORDER — PRAVASTATIN SODIUM 40 MG PO TABS
40.0000 mg | ORAL_TABLET | Freq: Every day | ORAL | Status: DC
  Administered 2023-02-26: 40 mg via ORAL
  Filled 2023-02-25: qty 1

## 2023-02-25 MED ORDER — HYDRALAZINE HCL 20 MG/ML IJ SOLN: 10.0000 mg | Freq: Once | INTRAMUSCULAR | Status: AC

## 2023-02-25 MED ORDER — CHLORHEXIDINE GLUCONATE 0.12 % MT SOLN
15.0000 mL | Freq: Once | OROMUCOSAL | Status: AC
  Administered 2023-02-25: 15 mL via OROMUCOSAL

## 2023-02-25 MED ORDER — AMLODIPINE BESYLATE 5 MG PO TABS
5.0000 mg | ORAL_TABLET | Freq: Once | ORAL | Status: AC
  Administered 2023-02-25: 5 mg via ORAL
  Filled 2023-02-25: qty 1

## 2023-02-25 MED ORDER — MUPIROCIN 2 % EX OINT: 1.0000 | TOPICAL_OINTMENT | Freq: Two times a day (BID) | CUTANEOUS | Status: DC | PRN

## 2023-02-25 MED ORDER — PHENYLEPHRINE HCL (PRESSORS) 10 MG/ML IV SOLN
INTRAVENOUS | Status: DC | PRN
Start: 2023-02-25 — End: 2023-02-25
  Administered 2023-02-25: 80 ug via INTRAVENOUS
  Administered 2023-02-25 (×5): 160 ug via INTRAVENOUS

## 2023-02-25 MED ORDER — ACETAMINOPHEN 10 MG/ML IV SOLN: 1000.0000 mg | Freq: Once | INTRAVENOUS | Status: DC | PRN

## 2023-02-25 MED ORDER — FENTANYL CITRATE (PF) 100 MCG/2ML IJ SOLN
INTRAMUSCULAR | Status: AC
  Filled 2023-02-25: qty 2

## 2023-02-25 MED ORDER — LIDOCAINE HCL (CARDIAC) PF 100 MG/5ML IV SOSY
PREFILLED_SYRINGE | INTRAVENOUS | Status: DC | PRN
Start: 2023-02-25 — End: 2023-02-25
  Administered 2023-02-25: 80 mg via INTRATRACHEAL

## 2023-02-25 MED ORDER — HYDRALAZINE HCL 20 MG/ML IJ SOLN
INTRAMUSCULAR | Status: AC
  Administered 2023-02-25: 10 mg via INTRAVENOUS
  Filled 2023-02-25: qty 1

## 2023-02-25 MED ORDER — STERILE WATER FOR IRRIGATION IR SOLN
Status: DC | PRN
  Administered 2023-02-25: 3000 mL

## 2023-02-25 MED ORDER — ONDANSETRON HCL 4 MG/2ML IJ SOLN
INTRAMUSCULAR | Status: DC | PRN
  Administered 2023-02-25: 4 mg via INTRAVENOUS

## 2023-02-25 MED ORDER — PHENYLEPHRINE 80 MCG/ML (10ML) SYRINGE FOR IV PUSH (FOR BLOOD PRESSURE SUPPORT)
PREFILLED_SYRINGE | INTRAVENOUS | Status: AC
  Filled 2023-02-25: qty 10

## 2023-02-25 MED ORDER — IOHEXOL 300 MG/ML  SOLN
INTRAMUSCULAR | Status: DC | PRN
  Administered 2023-02-25: 4 mL

## 2023-02-25 MED ORDER — LIDOCAINE HCL URETHRAL/MUCOSAL 2 % EX GEL
CUTANEOUS | Status: AC
  Filled 2023-02-25: qty 30

## 2023-02-25 MED ORDER — VITAMIN D (ERGOCALCIFEROL) 1.25 MG (50000 UNIT) PO CAPS: 50000.0000 [IU] | ORAL_CAPSULE | ORAL | Status: DC

## 2023-02-25 MED ORDER — PERFLUTREN LIPID MICROSPHERE
1.0000 mL | INTRAVENOUS | Status: AC | PRN
  Administered 2023-02-25: 2 mL via INTRAVENOUS

## 2023-02-25 MED ORDER — ACETAMINOPHEN 500 MG PO TABS: 1000.0000 mg | ORAL_TABLET | Freq: Four times a day (QID) | ORAL | Status: DC | PRN

## 2023-02-25 MED ORDER — FENTANYL CITRATE PF 50 MCG/ML IJ SOSY: 25.0000 ug | PREFILLED_SYRINGE | INTRAMUSCULAR | Status: DC | PRN

## 2023-02-25 MED ORDER — ACETAMINOPHEN 500 MG PO TABS: 1000.0000 mg | ORAL_TABLET | Freq: Once | ORAL | Status: DC | PRN

## 2023-02-25 MED ORDER — ASPIRIN 81 MG PO TBEC
81.0000 mg | DELAYED_RELEASE_TABLET | Freq: Every day | ORAL | Status: DC
  Administered 2023-02-26: 81 mg via ORAL
  Filled 2023-02-25: qty 1

## 2023-02-25 MED ORDER — FENTANYL CITRATE (PF) 100 MCG/2ML IJ SOLN
INTRAMUSCULAR | Status: DC | PRN
  Administered 2023-02-25: 50 ug via INTRAVENOUS

## 2023-02-25 MED ORDER — COLCHICINE 0.6 MG PO TABS: 0.6000 mg | ORAL_TABLET | Freq: Two times a day (BID) | ORAL | Status: DC | PRN

## 2023-02-25 SURGICAL SUPPLY — 11 items
BAG URO CATCHER STRL LF (MISCELLANEOUS) ×1 IMPLANT
CATH URETL OPEN END 6FR 70 (CATHETERS) ×1 IMPLANT
CLOTH BEACON ORANGE TIMEOUT ST (SAFETY) ×1 IMPLANT
GLOVE SURG LX STRL 7.5 STRW (GLOVE) ×1 IMPLANT
GOWN STRL REUS W/ TWL XL LVL3 (GOWN DISPOSABLE) ×2 IMPLANT
GOWN STRL REUS W/TWL XL LVL3 (GOWN DISPOSABLE) ×2
GUIDEWIRE STR DUAL SENSOR (WIRE) ×1 IMPLANT
MANIFOLD NEPTUNE II (INSTRUMENTS) ×1 IMPLANT
PACK CYSTO (CUSTOM PROCEDURE TRAY) ×1 IMPLANT
STENT URET 6FRX26 CONTOUR (STENTS) IMPLANT
TUBING CONNECTING 10 (TUBING) IMPLANT

## 2023-02-25 NOTE — Progress Notes (Signed)
  Echocardiogram 2D Echocardiogram has been performed.  Janalyn Harder 02/25/2023, 3:50 PM

## 2023-02-25 NOTE — Op Note (Signed)
Preoperative diagnosis: Bilateral ureteral stones Postoperative diagnosis: Bilateral ureteral stones Surgery: Cystoscopy removal of left ureteral stone with grasping forceps and right retrograde ureterogram and insertion of right ureteral stent Surgeon: Dr. Lorin Picket Shela Esses  The patient is above diagnoses and consented to the above procedure.  Extra care was taken with leg positioning.  He had preoperative antibiotics.  On initial cystoscopy I used a graduate to see if there was a stone in the bladder.  24 French cystoscope utilized.  Penile bulbar urethra normal.  Had bilobar enlargement of the prostate with a mild high riding bladder neck.  On inspection of the bladder he had a stone crowning at the left ureteral orifice.  There was very impressive edema and reddening of the trigone on both sides.  I had to fill the bladder a moderate amount to flatten out some of the edematous folds.  It looks like the 4 mm stone in total.  I used a grasping forceps first to push on the stone and then remove it with a grasping forceps.  In my opinion the entire stone was removed and there was E flux from the left ureteral orifice.  There was a lot of associated edema again post removal of stone  It took approximately 10 minutes to negotiate the right ureteral orifice due to the edema.  I could see the opening.  Sensor wire would not pass.  I tried to pass an open-ended ureteral catheter and it would not pass.  I then used both together to try to negotiate the wire.  I finally was able to advance the wire up the ureter to the mid ureter.  Open-ended ureteral catheter passed nicely.  Wire was removed.  I did a gentle retrograde on the right was 4 cc of contrast into moderate hydro on the right in the upper pole calyces  Sensor wire was passed to the upper pole calyx.  Open-ended ureteral catheter was removed.  26 cm x 6 French double-J stent with no string was passed currently in the upper pole calyx and currently in the  bladder.  Initially the contrast could still be seen at the ureteral vesicle junction either from the edema clamping down around the stent and or the stone at that level.  Finally there was good E flux from the ureteral orifice through and around the stent and the contrast in the right ureter evacuated for the most part  I carefully inspected the left ureteral orifice again and there was good E flux.  Because of the edema and difficulty on the right I did not think that she would pass a stent on the left.  The stone was sent for analysis.  I do believe he is now stone free on the left and he has a stent at right ureter with likely a distal stone on the right.  The stent in place will make future ureteroscopy much easier.  Will be followed as per protocol

## 2023-02-25 NOTE — Progress Notes (Addendum)
0830 patient leaving floor for surgery alertx4 on room air fluids stopped chg completed 1055 patient back from surgery alert able to make all needs known

## 2023-02-25 NOTE — Progress Notes (Signed)
2D echo attempted, patient in surgery. Will try later

## 2023-02-25 NOTE — Interval H&P Note (Signed)
History and Physical Interval Note:  02/25/2023 9:19 AM  Tyler [name]  has presented today for surgery, with the diagnosis of BILATERAL URETERAL STONE.  The various methods of treatment have been discussed with the patient and family. After consideration of risks, benefits and other options for treatment, the patient has consented to  Procedure(s): CYSTOSCOPY WITH RETROGRADE PYELOGRAM/URETERAL STENT PLACEMENT (Bilateral) as a surgical intervention.  The patient's history has been reviewed, patient examined, no change in status, stable for surgery.  I have reviewed the patient's chart and labs.  Questions were answered to the patient's satisfaction.     Kayren Holck A Rowland Ericsson

## 2023-02-25 NOTE — Progress Notes (Signed)
PROGRESS NOTE Tyler [name]  WUX:[id number] DOB: 1975/09/05 DOA: 02/24/2023 PCP: Sonny Masters, FNP  Brief Narrative/Hospital Course: 47 y.o.mw/ hx of osteoarthritis,NICM/chronic combined systolic/diastolic CHF, CAD, t2dm, gout, hyperlipidemia, hypertension, hypogonadism, thyroid cancer, postsurgical hypothyroidism,OSA,recent admission 8/29-8/31/2024 due to AKI /Right ureteral stone returned to the ED due to uncontrolled hypertension, right flank pain and hematuria. ED course: Initial vital signs were temperature 97.6 F, pulse 82, respirations 16, BP 181/130 mmHg O2 sat 100% on room air.  S/P hydralazine 10 mg IVP and labetalol 20 mg x 3 doses as needed was ordered. Lab work: His urine analysis showed moderate hemoglobin, small leukocyte esterase and rare bacteria.  CBC with a white count of 9.5, hemoglobin 12.8 g/dL and platelets 253.  TSH 7.002 IU/mL.  CMP showed a glucose of 211, BUN 22 and creatinine 2.40 mg/dL B/L Creat was 6.64 mg/dL 4 days PTA Imaging: CT renal study showed persistent right-sided renal collecting system mild dilatation.  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  However there is a new Cholestin system dilatation of the left kidney and ureter with some perinephric stranding.  The previous 4 mm stone in the left renal pelvis is no longer identified.  Fatty liver infiltration.  Urology consulted and admitted    Subjective: Patient seen and examined Wife at the bedside Reports she had worsening of right flank pain overnight, pain this morning subsided after pain medication Overnight patient has been afebrile blood pressure running in 140s to 160s> has trended up to 102/now patient reports similar elevated blood pressure around his hip surgery in the past Blood sugar 94, a.m. labs pending  Assessment and Plan: Principal Problem:    Hypertensive urgency Active Problems:   Tobacco abuse   Postsurgical hypothyroidism   Type 2 diabetes mellitus with hyperglycemia, without long-term current use of insulin (HCC)   Nonischemic cardiomyopathy (HCC)   Mild sleep apnea   Morbid obesity (HCC)   Coronary artery disease involving native coronary artery of native heart without angina pectoris   AKI (acute kidney injury) (HCC)   Chronic combined systolic and diastolic CHF (congestive heart failure) (HCC)   Urolithiasis   AKI with obstructive uropathy Metabolic acidosis Likely bilateral ureteral stone Recent admission with AKI and right ureteral stone 8/29-8/31: Labs shows creatinine up at 2.8, input appreciated from urology, continue IV antibiotics, keep n.p.o.,planning for stent 9/5. Keep IV fluids and monitor renal function Recent Labs    04/23/22 0821 06/09/22 1516 08/28/22 1352 09/25/22 1214 02/03/23 1459 02/18/23 1530 02/18/23 1754 02/19/23 0359 02/20/23 0416 02/24/23 1239  BUN 11 9 10 11 8 17 17 16 17  22*  CREATININE 0.97 1.04 0.78 0.80 1.04 2.23* 2.03* 1.84* 1.66* 2.40*  CO2 21 24 21* 21 19* 21* 24 20* 21* 23    Hypertension urgency: Blood pressure much improved overnight> on amlodipine 5, coreg 12.5 twice daily, hydralazine 25mg  tid and on IV PRNs. Blood pressure elevated again this now and about to get his a.m. meds likely pain contributing also Entresto on hold.  Will give additional amlodipine 5 mg x 1 today .  Continue to monitor and address  CAD: No chest pain, on aspirin and Coreg  NICM Chronic combined systolic and diastolic dysfunction: Holding Entresto due to AKI continue Coreg.Repeat echo has been ordered.  Monitor volume status  Hypothyroidism postsurgical: Continue home levothyroxine 200 mcg.  TSH 55  normal but will need recheck after acute illness resolves.  T2DM without long-term use of insulin: Recent HbA1c stable 7.2, continue on SSI holding OHA Recent Labs  Lab 02/18/23 2156  02/18/23 2351 02/19/23 2349 02/20/23 0418 02/20/23 0739 02/24/23 1812 02/25/23 0057  GLUCAP  --    < > 110* 149* 133* 130* 194*  HGBA1C 7.2*  --   --   --   --   --   --    < > = values in this interval not displayed.   OSA: Not on CPAP.  Tobacco use: Cessation will be advised  Morbid obesity:Patient's Body mass index is 40.17 kg/m. : Will benefit with PCP follow-up, weight loss/healthy lifestyle   DVT prophylaxis: SCDs Start: 02/24/23 1655 Code Status:   Code Status: Full Code Family Communication: plan of care discussed with patient/wife at bedside. Patient status is: Inpatient because of obstructive uropathy Level of care: Stepdown   Dispo: The patient is from: Home            Anticipated disposition: Home TBD Objective: Vitals last 24 hrs: Vitals:   02/25/23 0300 02/25/23 0400 02/25/23 0500 02/25/23 0600  BP: (!) 173/103 (!) 163/116 (!) 143/112 (!) 145/125  Pulse: 81 77 64 (!) 59  Resp: 16 20 (!) 23 12  Temp:  (!) 97 F (36.1 C)    TempSrc:      SpO2: 96% 98% 93% 94%  Weight:      Height:       Weight change:   Physical Examination: General exam: alert awake, older than stated age HEENT:Oral mucosa moist, Ear/Nose WNL grossly Respiratory system: bilaterally CLEAR BS, no use of accessory muscle Cardiovascular system: S1 & S2 +, No JVD. Gastrointestinal system: Abdomen soft,NT,ND, BS+ Nervous System:Alert, awake, moving extremities. Extremities: LE edema NEG,distal peripheral pulses palpable.  Skin: No rashes,no icterus. MSK: Normal muscle bulk,tone, power  Medications reviewed:  Scheduled Meds:  amLODipine  5 mg Oral Daily   carvedilol  12.5 mg Oral BID WC   Chlorhexidine Gluconate Cloth  6 each Topical Daily   hydrALAZINE  25 mg Oral TID   insulin aspart  0-15 Units Subcutaneous TID WC   levothyroxine  200 mcg Oral QAC breakfast   magnesium oxide  400 mg Oral QHS   tamsulosin  0.4 mg Oral Daily   Continuous Infusions:  sodium chloride 125 mL/hr  at 02/25/23 0332   ciprofloxacin Stopped (02/25/23 0626)    Diet Order             Diet NPO time specified  Diet effective midnight                  No intake or output data in the 24 hours ending 02/25/23 0723 Net IO Since Admission: No IO data has been entered for this period [02/25/23 0723]  Wt Readings from Last 3 Encounters:  02/24/23 127 kg  02/18/23 127.1 kg  02/03/23 127 kg     Unresulted Labs (From admission, onward)     Start     Ordered   02/25/23 0617  CBC  Once,   R       Question:  Specimen collection method  Answer:  Lab=Lab collect   02/25/23 0616   02/25/23 8182  Basic metabolic panel  Once,   R       Question:  Specimen collection method  Answer:  Lab=Lab collect   02/25/23 9937          Data Reviewed:  I have personally reviewed following labs and imaging studies CBC: Recent Labs  Lab 02/18/23 1525 02/19/23 0359 02/20/23 0416 02/24/23 1239  WBC 10.8* 11.5* 9.6 9.5  NEUTROABS  --   --   --  6.1  HGB 13.5 12.9* 11.8* 12.8*  HCT 40.4 38.8* 35.9* 38.6*  MCV 88.8 91.9 91.6 90.6  PLT 249 203 188 246   Basic Metabolic Panel: Recent Labs  Lab 02/18/23 1530 02/18/23 1754 02/18/23 1900 02/19/23 0359 02/20/23 0416 02/24/23 1239  NA 137 141  --  137 138 141  K 3.6 3.5  --  3.4* 3.9 4.4  CL 104 106  --  108 109 108  CO2 21* 24  --  20* 21* 23  GLUCOSE 215* 105*  --  155* 155* 211*  BUN 17 17  --  16 17 22*  CREATININE 2.23* 2.03*  --  1.84* 1.66* 2.40*  CALCIUM 9.3 8.7*  --  8.3* 8.8* 9.0  MG  --   --  1.7 2.0 1.8  --   PHOS  --   --  3.4 3.9 3.9  --    GFR: Estimated Creatinine Clearance: 52.6 mL/min (A) (by C-G formula based on SCr of 2.4 mg/dL (H)). Liver Function Tests: Recent Labs  Lab 02/18/23 1530 02/18/23 1930 02/19/23 0359 02/20/23 0416 02/24/23 1239  AST 30 21 19   --  16  ALT 39 33 31  --  19  ALKPHOS 67 62 64  --  74  BILITOT 0.7 0.5 0.6  --  0.7  PROT 7.3 6.7 6.4*  --  7.3  ALBUMIN 3.9 3.5 3.3* 3.1* 3.6   Recent  Labs    02/24/23 1239  TSH 7.002*  FREET4 1.21*  Sepsis Labs: No results for input(s): "PROCALCITON", "LATICACIDVEN" in the last 168 hours.  Recent Results (from the past 240 hour(s))  Urine Culture     Status: None   Collection Time: 02/18/23  5:54 PM   Specimen: Urine, Clean Catch  Result Value Ref Range Status   Specimen Description   Final    URINE, CLEAN CATCH Performed at [facility], [address] [place], [place] [address]    Special Requests   Final    NONE Performed at [facility], [address] [place], [place] [address]    Culture   Final    NO GROWTH Performed at [facility], [address] [place], [place] [address]    Report Status 02/19/2023 FINAL  Final  MRSA Next Gen by PCR, Nasal     Status: None   Collection Time: 02/24/23  6:18 PM   Specimen: Nasal Mucosa; Nasal Swab  Result Value Ref Range Status   MRSA by PCR Next Gen NOT DETECTED NOT DETECTED Final    Comment: (NOTE) The GeneXpert MRSA Assay (FDA approved for NASAL specimens only), is one component of a comprehensive MRSA colonization surveillance program. It is not intended to diagnose MRSA infection nor to guide or monitor treatment for MRSA infections. Test performance is not FDA approved in patients less than 76 years old. Performed at [facility], [address], [place], [place] [address]     Antimicrobials: Anti-infectives (From admission, onward)    Start     Dose/Rate Route Frequency Ordered Stop   02/24/23 1800  ciprofloxacin (CIPRO) IVPB 400 mg       Note to Pharmacy: Preop for urological procedure.   400 mg 200 mL/hr  over 60 Minutes Intravenous Every 12 hours 02/24/23 1658        Culture/Microbiology    Component Value Date/Time   SDES  02/18/2023 1754    URINE, CLEAN CATCH Performed at [facility], [address] [place], [place] [address]    [facility]  02/18/2023 1754    NONE Performed at [facility], [address], [place], [place] [address]    CULT  02/18/2023 1754    NO GROWTH Performed at [facility], [address] [place] [address], [place], [place] [address]    REPTSTATUS 02/19/2023 FINAL 02/18/2023 1754    Radiology Studies: CT Renal Stone Study  Result Date: 02/24/2023 CLINICAL DATA:  Flank pain. EXAM: CT ABDOMEN AND PELVIS WITHOUT CONTRAST TECHNIQUE: Multidetector CT imaging of the abdomen and pelvis was performed following the standard protocol without IV contrast. RADIATION DOSE REDUCTION: This exam was performed according to the departmental dose-optimization program which includes automated exposure control, adjustment of the mA and/or kV according to patient size and/or use of iterative reconstruction technique. COMPARISON:  CT 02/18/2023 FINDINGS: Lower chest: Mild lingular atelectasis or scar. No pleural effusion lung bases. Coronary artery calcifications are seen. Hepatobiliary: Fatty liver infiltration.  Gallbladder is contracted. Pancreas: Unremarkable. No pancreatic ductal dilatation or surrounding inflammatory changes. Spleen: Normal in size without focal abnormality. Adrenals/Urinary Tract: The adrenal glands are preserved. Persistent mild right-sided renal collecting system dilatation. Punctate nonobstructing lower pole right-sided renal stone. Previously there is a small stone at the right distal ureter just proximal to the UVJ. There is suggestion of a stone this location again but this areas obscured by the extensive streak artifact from the bilateral hip arthroplasties. However there is new left-sided collecting system dilatation with what appears to be a stone in the extreme distal left ureter on series 2, image 90. Previously there was a 4 mm stone in the left kidney which is no longer seen in the left kidney today. There are some separate punctate nonobstructing lower pole left-sided stones. Again the bladder is obscured. What is seen has some slight wall thickening  and stranding. Stomach/Bowel: Large bowel has a normal course and caliber with scattered stool. Normal appendix. Moderate fluid and debris in the stomach. The small bowel is nondilated. Vascular/Lymphatic: Aortic atherosclerosis. No enlarged abdominal or pelvic lymph nodes. Reproductive: Prostate is obscured. Other: No free air or free fluid. Musculoskeletal: Degenerative changes along the spine and pelvis. Multilevel disc bulging along the lower lumbar spine with some stenosis. Trace retrolisthesis of L4-5 and L5-S1. Again bilateral hip arthroplasties are seen with significant streak artifact obscuring significant portions of the pelvis. IMPRESSION: Persistent right-sided renal collecting system mild dilatation. The distal right ureteral stone is felt to be present but the bladder and distal ureters are poorly seen with the extensive streak artifact from the bilateral hip arthroplasties. However there is new collecting system dilatation of the left kidney and ureter with some perinephric stranding. The previous 4 mm stone in the left renal pelvis is now no longer identified. Please correlate for the stone being in the distal ureter. In the setting of potential distal bilateral ureteral obstruction, please correlate with patient's renal function and consider urologic consultation Slight wall thickening of the visualized portions of the urinary bladder with stranding. Fatty liver infiltration. Electronically Signed   By: Karen Kays M.D.   On: 02/24/2023 15:05     LOS: 0 days   Lanae Boast, MD [facility]  02/25/2023, 7:23 AM

## 2023-02-25 NOTE — Plan of Care (Signed)

## 2023-02-25 NOTE — Anesthesia Procedure Notes (Signed)
Procedure Name: LMA Insertion Date/Time: 02/25/2023 9:29 AM  Performed by: Deri Fuelling, CRNAPre-anesthesia Checklist: Patient identified, Emergency Drugs available, Suction available and Patient being monitored Patient Re-evaluated:Patient Re-evaluated prior to induction Oxygen Delivery Method: Circle system utilized Preoxygenation: Pre-oxygenation with 100% oxygen Induction Type: IV induction Ventilation: Mask ventilation without difficulty LMA: LMA inserted LMA Size: 5.0 Tube type: Oral Number of attempts: 1 Airway Equipment and Method: Stylet and Oral airway Placement Confirmation: ETT inserted through vocal cords under direct vision, positive ETCO2 and breath sounds checked- equal and bilateral Tube secured with: Tape Dental Injury: Teeth and Oropharynx as per pre-operative assessment

## 2023-02-25 NOTE — Anesthesia Preprocedure Evaluation (Addendum)
Anesthesia Evaluation  Patient identified by MRN, date of birth, ID band Patient awake    Reviewed: Allergy & Precautions, NPO status , Patient's Chart, lab work & pertinent test results  History of Anesthesia Complications Negative for: history of anesthetic complications  Airway Mallampati: IV  TM Distance: >3 FB Neck ROM: Full  Mouth opening: Limited Mouth Opening  Dental  (+) Teeth Intact, Dental Advisory Given   Pulmonary neg shortness of breath, neg sleep apnea, neg COPD, neg recent URI, Current Smoker and Patient abstained from smoking.   breath sounds clear to auscultation       Cardiovascular hypertension, Pt. on medications + CAD and +CHF   Rhythm:Regular     Neuro/Psych negative neurological ROS  negative psych ROS   GI/Hepatic negative GI ROS, Neg liver ROS,,,  Endo/Other  diabetesHypothyroidism  Morbid obesityLab Results      Component                Value               Date                      HGBA1C                   7.2 (H)             02/18/2023             Renal/GU ARFRenal diseaseLab Results      Component                Value               Date                      NA                       141                 02/24/2023                K                        4.4                 02/24/2023                CO2                      23                  02/24/2023                GLUCOSE                  211 (H)             02/24/2023                BUN                      22 (H)              02/24/2023                CREATININE               2.40 (H)  02/24/2023                CALCIUM                  9.0                 02/24/2023                EGFR                     112                 09/25/2022                GFRNONAA                 33 (L)              02/24/2023                Musculoskeletal  (+) Arthritis ,    Abdominal   Peds  Hematology  (+) Blood dyscrasia, anemia Lab  Results      Component                Value               Date                      WBC                      9.5                 02/24/2023                HGB                      12.8 (L)            02/24/2023                HCT                      38.6 (L)            02/24/2023                MCV                      90.6                02/24/2023                PLT                      246                 02/24/2023              Anesthesia Other Findings Tyler [name] is 47 y.o. yrs old male who returns in follow-up today after recent hospitaliztion. He usually sees Dr. Danne Harbor at office. He does have a history of chronic hypertension, mild to moderate CAD, nonischemic cardiomyopathy with recovered ejection fraction. Initially, he was found to have ejection fraction of 30 to 35% by echocardiogram in 2021 in the setting of uncontrolled hypertension. Currently, his ejection fraction has improved to 45 to 50%, he used to take Coreg and Entresto for cardiomyopathy. He  is obese with BMI of 40.86. He does have a history of chronic hypertension, type 2 diabetes, mild OSA, chronic fatigue, history of smoking.    Reproductive/Obstetrics                             Anesthesia Physical Anesthesia Plan  ASA: 3  Anesthesia Plan: General   Post-op Pain Management: Minimal or no pain anticipated   Induction: Intravenous  PONV Risk Score and Plan: 1 and Ondansetron and Dexamethasone  Airway Management Planned: Oral ETT and LMA  Additional Equipment: None  Intra-op Plan:   Post-operative Plan: Extubation in OR  Informed Consent: I have reviewed the patients History and Physical, chart, labs and discussed the procedure including the risks, benefits and alternatives for the proposed anesthesia with the patient or authorized representative who has indicated his/her understanding and acceptance.     Dental advisory given  Plan Discussed with: CRNA  Anesthesia  Plan Comments:         Anesthesia Quick Evaluation

## 2023-02-25 NOTE — Hospital Course (Addendum)
46 y.o.mw/ hx of osteoarthritis,NICM/chronic combined systolic/diastolic CHF, CAD, t2dm, gout, hyperlipidemia, hypertension, hypogonadism, thyroid cancer, postsurgical hypothyroidism,OSA,recent admission 8/29-8/31/2024 due to AKI /Right ureteral stone returned to the ED due to uncontrolled hypertension, right flank pain and hematuria. ED course: Initial vital signs were temperature 97.6 F, pulse 82, respirations 16, BP 181/130 mmHg O2 sat 100% on room air.  S/P hydralazine 10 mg IVP and labetalol 20 mg x 3 doses as needed was ordered. Lab work: His urine analysis showed moderate hemoglobin, small leukocyte esterase and rare bacteria.  CBC with a white count of 9.5, hemoglobin 12.8 g/dL and platelets 161.  TSH 7.002 IU/mL.  CMP showed a glucose of 211, BUN 22 and creatinine 2.40 mg/dL B/L Creat was 0.96 mg/dL 4 days PTA Imaging: CT renal study showed persistent right-sided renal collecting system mild dilatation.  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  However there is a new Cholestin system dilatation of the left kidney and ureter with some perinephric stranding.  The previous 4 mm stone in the left renal pelvis is no longer identified.  Fatty liver infiltration. Urology consulted and admitted> underwent cystoscopy removal of left ureteral stent with insertion of right ureteral stent.renal function improving and patient wanting to go home, repeat bmp is further better we will dc him home, he will need to f/u with urology as OP

## 2023-02-25 NOTE — TOC CM/SW Note (Signed)
Transition of Care [facility]) - Inpatient Brief Assessment  Patient Details  Name: Tyler [name] MRN: [id number] Date of Birth: [birth year]  Transition of [facility]) CM/SW Contact:    Ewing Schlein, LCSW Phone Number: 02/25/2023, 3:35 PM  Clinical Narrative: Patient recently discharged on 02/20/23. Per chart screening, there are no TOC needs identified at this time. Please consult TOC if any needs arise.  Transition of Care Asessment: Insurance and Status: Insurance coverage has been reviewed Patient has primary care physician: Yes Home environment has been reviewed: Resides at home with spouse Prior level of function:: Independent at baseline Prior/Current Home Services: No current home services Social Determinants of Health Reivew: SDOH reviewed no interventions necessary Readmission risk has been reviewed: Yes Transition of care needs: no transition of care needs at this time

## 2023-02-25 NOTE — Progress Notes (Addendum)
CCC Pre-op Review  Pre-op checklist: asked rn to complete  NPO: at MN  Labs: completed  Consent: completed  H&P: yes  Vitals: Highly elevated B/P anesthesia aware  O2 requirements: none  MAR/PTA review: coreg given 0739, cipro given at 0526, dilaudid given at 0741, labetalol given at 0739  IV: 20G L aAC  Floor nurse name:    Additional info: stepdown unit, elevated A1c 7.2 will need diabetic protocol Order for echo, per Dr Maple Hudson doesn't need to be completed before surgery pt had one in 03/2022 in care everywhere

## 2023-02-25 NOTE — Transfer of Care (Signed)
Immediate Anesthesia Transfer of Care Note  Patient: Tyler [name]  Procedure(s) Performed: CYSTOSCOPY WITH RETROGRADE PYELOGRAM/URETERAL STENT PLACEMENT (Bilateral: Pelvis)  Patient Location: PACU  Anesthesia Type:General  Level of Consciousness: sedated  Airway & Oxygen Therapy: Patient Spontanous Breathing and Patient connected to face mask oxygen  Post-op Assessment: Report given to RN and Post -op Vital signs reviewed and stable  Post vital signs: Reviewed and stable  Last Vitals:  Vitals Value Taken Time  BP 122/85 02/25/23 1011  Temp    Pulse 74 02/25/23 1013  Resp 10 02/25/23 1013  SpO2 100 % 02/25/23 1013  Vitals shown include unfiled device data.  Last Pain:  Vitals:   02/25/23 0848  TempSrc:   PainSc: 0-No pain         Complications: No notable events documented.

## 2023-02-26 ENCOUNTER — Encounter (HOSPITAL_COMMUNITY): Payer: Self-pay | Admitting: Urology

## 2023-02-26 DIAGNOSIS — I160 Hypertensive urgency: Secondary | ICD-10-CM | POA: Diagnosis not present

## 2023-02-26 LAB — BASIC METABOLIC PANEL
Anion gap: 9 (ref 5–15)
Anion gap: 9 (ref 5–15)
BUN: 24 mg/dL — ABNORMAL HIGH (ref 6–20)
BUN: 24 mg/dL — ABNORMAL HIGH (ref 6–20)
CO2: 21 mmol/L — ABNORMAL LOW (ref 22–32)
CO2: 22 mmol/L (ref 22–32)
Calcium: 8.3 mg/dL — ABNORMAL LOW (ref 8.9–10.3)
Calcium: 8.3 mg/dL — ABNORMAL LOW (ref 8.9–10.3)
Chloride: 107 mmol/L (ref 98–111)
Chloride: 106 mmol/L (ref 98–111)
Creatinine, Ser: 2.08 mg/dL — ABNORMAL HIGH (ref 0.61–1.24)
Creatinine, Ser: 1.64 mg/dL — ABNORMAL HIGH (ref 0.61–1.24)
GFR, Estimated: 40 mL/min — ABNORMAL LOW (ref >60)
GFR, Estimated: 53 mL/min — ABNORMAL LOW (ref >60)
Glucose, Bld: 241 mg/dL — ABNORMAL HIGH (ref 70–99)
Glucose, Bld: 288 mg/dL — ABNORMAL HIGH (ref 70–99)
Potassium: 4.9 mmol/L (ref 3.5–5.1)
Potassium: 4.5 mmol/L (ref 3.5–5.1)
Sodium: 137 mmol/L (ref 135–145)
Sodium: 137 mmol/L (ref 135–145)

## 2023-02-26 LAB — CBC
HCT: 34.8 % — ABNORMAL LOW (ref 39.0–52.0)
Hemoglobin: 11.7 g/dL — ABNORMAL LOW (ref 13.0–17.0)
MCH: 30.4 pg (ref 26.0–34.0)
MCHC: 33.6 g/dL (ref 30.0–36.0)
MCV: 90.4 fL (ref 80.0–100.0)
Platelets: 253 10*3/uL (ref 150–400)
RBC: 3.85 MIL/uL — ABNORMAL LOW (ref 4.22–5.81)
RDW: 13.1 % (ref 11.5–15.5)
WBC: 13.2 10*3/uL — ABNORMAL HIGH (ref 4.0–10.5)
nRBC: 0 % (ref 0.0–0.2)

## 2023-02-26 LAB — URINE CULTURE: Culture: NO GROWTH

## 2023-02-26 LAB — GLUCOSE, CAPILLARY
Glucose-Capillary: 217 mg/dL — ABNORMAL HIGH (ref 70–99)
Glucose-Capillary: 199 mg/dL — ABNORMAL HIGH (ref 70–99)
Glucose-Capillary: 254 mg/dL — ABNORMAL HIGH (ref 70–99)
Glucose-Capillary: 278 mg/dL — ABNORMAL HIGH (ref 70–99)

## 2023-02-26 MED ORDER — CEFADROXIL 500 MG PO CAPS: 1000.0000 mg | ORAL_CAPSULE | Freq: Two times a day (BID) | ORAL | 0 refills | Status: AC

## 2023-02-26 MED ORDER — CEFADROXIL 500 MG PO CAPS
1000.0000 mg | ORAL_CAPSULE | Freq: Two times a day (BID) | ORAL | Status: DC
  Administered 2023-02-26: 1000 mg via ORAL
  Filled 2023-02-26: qty 2

## 2023-02-26 NOTE — Progress Notes (Signed)
PROGRESS NOTE Tyler [name]  XNA:[id number] DOB: September 20, 1975 DOA: 02/24/2023 PCP: Sonny Masters, FNP  Brief Narrative/Hospital Course: 47 y.o.mw/ hx of osteoarthritis,NICM/chronic combined systolic/diastolic CHF, CAD, t2dm, gout, hyperlipidemia, hypertension, hypogonadism, thyroid cancer, postsurgical hypothyroidism,OSA,recent admission 8/29-8/31/2024 due to AKI /Right ureteral stone returned to the ED due to uncontrolled hypertension, right flank pain and hematuria. ED course: Initial vital signs were temperature 97.6 F, pulse 82, respirations 16, BP 181/130 mmHg O2 sat 100% on room air.  S/P hydralazine 10 mg IVP and labetalol 20 mg x 3 doses as needed was ordered. Lab work: His urine analysis showed moderate hemoglobin, small leukocyte esterase and rare bacteria.  CBC with a white count of 9.5, hemoglobin 12.8 g/dL and platelets 542.  TSH 7.002 IU/mL.  CMP showed a glucose of 211, BUN 22 and creatinine 2.40 mg/dL B/L Creat was 7.06 mg/dL 4 days PTA Imaging: CT renal study showed persistent right-sided renal collecting system mild dilatation.  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  However there is a new Cholestin system dilatation of the left kidney and ureter with some perinephric stranding.  The previous 4 mm stone in the left renal pelvis is no longer identified.  Fatty liver infiltration. Urology consulted and admitted> underwent cystoscopy removal of left ureteral stent with insertion of right ureteral stent     Subjective: Overnight patient has been afebrile blood pressure running in 120s to 140s Reports having developed blood initially otherwise voiding well and clear urine Lab this morning shows creatinine is improving  Assessment and Plan: Principal Problem:   Hypertensive urgency Active Problems:   Tobacco abuse   Postsurgical  hypothyroidism   Type 2 diabetes mellitus with hyperglycemia, without long-term current use of insulin (HCC)   Nonischemic cardiomyopathy (HCC)   Mild sleep apnea   Morbid obesity (HCC)   Coronary artery disease involving native coronary artery of native heart without angina pectoris   AKI (acute kidney injury) (HCC)   Chronic combined systolic and diastolic CHF (congestive heart failure) (HCC)   Urolithiasis   AKI with obstructive uropathy Metabolic acidosis Likely bilateral ureteral stone Recent admission with AKI and right ureteral stone 8/29-8/31: underwent cystoscopy removal of left ureteral stent with insertion of right ureteral stent, now creatinine is improving. Continue IV fluid hydration encourage oral intake.  Urine culture in process, on empiric ciprofloxacin.  Continue Flomax await further urological recommendation and monitor renal function Recent Labs    08/28/22 1352 09/25/22 1214 02/03/23 1459 02/18/23 1530 02/18/23 1754 02/19/23 0359 02/20/23 0416 02/24/23 1239 02/25/23 0822 02/26/23 0424  BUN 10 11 8 17 17 16 17  22* 23* 24*  CREATININE 0.78 0.80 1.04 2.23* 2.03* 1.84* 1.66* 2.40* 2.88* 2.08*  CO2 21* 21 19* 21* 24 20* 21* 23 21* 21*    Hypertension urgency: Blood pressure much improved now, poor control likely in the setting of pain .  Continue Coreg, hydralazine amlodipine 5 mg consider increasing to 10 mg if remains elevated, holding chlorthalidone.    CAD: No chest pain, on aspirin and Coreg-resume  NICM Chronic combined systolic and diastolic dysfunction: Holding Entresto due to AKI , continue Coreg.Repeat echo has been ordered> shows EF 55-60% moderate LVH G1 DD.  Monitor volume status  Hypothyroidism postsurgical: Continue home levothyroxine 200 mcg.  TSH / ft abnormal could be in the setting of acute illness so will need to  be rechecked in 2 weeks and adjust Synthroid 4   T2DM without long-term use of insulin: Recent HbA1c stable 7.2, continue  on SSI holding OHA-blood sugars poorly controlled Recent Labs  Lab 02/25/23 1139 02/25/23 1548 02/25/23 2215 02/26/23 0341 02/26/23 0744  GLUCAP 151* 291* 345* 217* 199*   OSA: Not on CPAP.  Tobacco use: Cessation will be advised  Morbid obesity:Patient's Body mass index is 40.81 kg/m. : Will benefit with PCP follow-up, weight loss/healthy lifestyle   DVT prophylaxis: SCDs Start: 02/24/23 1655 Code Status:   Code Status: Full Code Family Communication: plan of care discussed with patient/wife at bedside. Patient status is: Inpatient because of obstructive uropathy Level of care: Progressive   Dispo: The patient is from: Home            Anticipated disposition: Home TBD Objective: Vitals last 24 hrs: Vitals:   02/25/23 1819 02/25/23 1903 02/25/23 2220 02/26/23 0323  BP: (!) 142/96 (!) 125/94 (!) 138/93 130/84  Pulse: (!) 101 99 82 85  Resp: 18 20 20 18   Temp:  98 F (36.7 C) 97.8 F (36.6 C) 97.7 F (36.5 C)  TempSrc:  Oral Oral Oral  SpO2: 96% 97% 96% 97%  Weight:  129 kg    Height:  5\' 10"  (1.778 m)     Weight change: 2 kg  Physical Examination: General exam: alert awake, oriented at baseline, older than stated age HEENT:Oral mucosa moist, Ear/Nose WNL grossly Respiratory system: Bilaterally clear BS,no use of accessory muscle Cardiovascular system: S1 & S2 +, No JVD. Gastrointestinal system: Abdomen soft,NT,ND, BS+ Nervous System: Alert, awake, moving all extremities,and following commands. Extremities: LE edema neg,distal peripheral pulses palpable and warm.  Skin: No rashes,no icterus. MSK: Normal muscle bulk,tone, power   Medications reviewed:  Scheduled Meds:  amLODipine  5 mg Oral Daily   aspirin EC  81 mg Oral Daily   carvedilol  12.5 mg Oral BID WC   Chlorhexidine Gluconate Cloth  6 each Topical Daily   hydrALAZINE  25 mg Oral TID   insulin aspart  0-15 Units Subcutaneous TID WC   levothyroxine  200 mcg Oral QAC breakfast   magnesium oxide   400 mg Oral QHS   pravastatin  40 mg Oral Daily   tamsulosin  0.4 mg Oral Daily   [START ON 02/28/2023] Vitamin D (Ergocalciferol)  50,000 Units Oral Q Sun   Continuous Infusions:  sodium chloride 125 mL/hr at 02/26/23 0529   ciprofloxacin 400 mg (02/26/23 0530)    Diet Order             Diet regular Room service appropriate? Yes; Fluid consistency: Thin  Diet effective now                   Intake/Output Summary (Last 24 hours) at 02/26/2023 0814 Last data filed at 02/26/2023 0600 Gross per 24 hour  Intake 3715.43 ml  Output 300 ml  Net 3415.43 ml   Net IO Since Admission: 5,117.65 mL [02/26/23 0814]  Wt Readings from Last 3 Encounters:  02/25/23 129 kg  02/18/23 127.1 kg  02/03/23 127 kg     Unresulted Labs (From admission, onward)     Start     Ordered   02/26/23 0500  Basic metabolic panel  Daily,   R     Question:  Specimen collection method  Answer:  Lab=Lab collect   02/25/23 1106   02/26/23 0500  CBC  Daily,   R  Question:  Specimen collection method  Answer:  Lab=Lab collect   02/25/23 1106   02/25/23 1206  Urine Culture (for pregnant, neutropenic or urologic patients or patients with an indwelling urinary catheter)  (Urine Labs)  Add-on,   AD       Question:  Indication  Answer:  Dysuria   02/25/23 1205   02/25/23 0958  Calculi, with Photograph (to Clinical Lab)  RELEASE UPON ORDERING,   TIMED       Comments: Specimen A: Specimen Description left renal stone Phone [phone]         Previous Biopsy:  no Is the patient on airborne/droplet precautions? No           Clinical History:  renal stones Copy of Report to:  N/A Specimen Disposition: OR Specimen Holding     02/25/23 0958          Data Reviewed: I have personally reviewed following labs and imaging studies CBC: Recent Labs  Lab 02/20/23 0416 02/24/23 1239 02/25/23 0822 02/26/23 0424  WBC 9.6 9.5 10.5 13.2*  NEUTROABS  --  6.1  --   --   HGB 11.8* 12.8* 12.0* 11.7*  HCT 35.9* 38.6*  36.5* 34.8*  MCV 91.6 90.6 91.0 90.4  PLT 188 246 234 253   Basic Metabolic Panel: Recent Labs  Lab 02/20/23 0416 02/24/23 1239 02/25/23 0822 02/26/23 0424  NA 138 141 138 137  K 3.9 4.4 4.4 4.9  CL 109 108 106 107  CO2 21* 23 21* 21*  GLUCOSE 155* 211* 157* 241*  BUN 17 22* 23* 24*  CREATININE 1.66* 2.40* 2.88* 2.08*  CALCIUM 8.8* 9.0 8.2* 8.3*  MG 1.8  --   --   --   PHOS 3.9  --   --   --    GFR: Estimated Creatinine Clearance: 61.2 mL/min (A) (by C-G formula based on SCr of 2.08 mg/dL (H)). Liver Function Tests: Recent Labs  Lab 02/20/23 0416 02/24/23 1239  AST  --  16  ALT  --  19  ALKPHOS  --  74  BILITOT  --  0.7  PROT  --  7.3  ALBUMIN 3.1* 3.6   Recent Labs    02/24/23 1239  TSH 7.002*  FREET4 1.21*  Sepsis Labs: No results for input(s): "PROCALCITON", "LATICACIDVEN" in the last 168 hours.  Recent Results (from the past 240 hour(s))  Urine Culture     Status: None   Collection Time: 02/18/23  5:54 PM   Specimen: Urine, Clean Catch  Result Value Ref Range Status   Specimen Description   Final    URINE, CLEAN CATCH Performed at [facility], [address] [place], [place] [address]    Special Requests   Final    NONE Performed at [facility], [address] [place], [place] [address]    Culture   Final    NO GROWTH Performed at [facility], [address], [place], [place] [address]    Report Status 02/19/2023 FINAL  Final  MRSA Next Gen by PCR, Nasal     Status: None   Collection Time: 02/24/23  6:18 PM   Specimen: Nasal Mucosa; Nasal Swab  Result Value Ref Range Status   MRSA by PCR Next Gen NOT DETECTED NOT DETECTED Final    Comment: (NOTE) The GeneXpert MRSA Assay (FDA approved for NASAL specimens only), is one component of a comprehensive MRSA colonization surveillance program. It is not  intended to diagnose MRSA infection nor to guide or monitor treatment for MRSA infections. Test performance is not  FDA approved in patients less than 47 years old. Performed at [facility], [address], [place], [place] [address]     Antimicrobials: Anti-infectives (From admission, onward)    Start     Dose/Rate Route Frequency Ordered Stop   02/24/23 1800  ciprofloxacin (CIPRO) IVPB 400 mg       Note to Pharmacy: Preop for urological procedure.   400 mg 200 mL/hr over 60 Minutes Intravenous Every 12 hours 02/24/23 1658        Culture/Microbiology    Component Value Date/Time   SDES  02/18/2023 1754    URINE, CLEAN CATCH Performed at [facility], [address] [place], [place] [address]    [facility] [place]  02/18/2023 1754    NONE Performed at [facility], [address] [place], [place] [address]    CULT  02/18/2023 1754    NO GROWTH Performed at [facility], [address] [place], [place] [address]    REPTSTATUS 02/19/2023 FINAL 02/18/2023 1754    Radiology Studies: ECHOCARDIOGRAM COMPLETE  Result Date: 02/25/2023    ECHOCARDIOGRAM REPORT   Patient Name:   Calob JATAVIOUS GEHRMAN Date of Exam: 02/25/2023 Medical Rec #:  [id number]      Height:       70.0 in Accession #:    [id number]     Weight:       280.0 lb Date of Birth:  1975-12-08      BSA:          2.408 m Patient Age:    46 years       BP:           181/123 mmHg Patient Gender: M              HR:           89 bpm. Exam Location:  Inpatient Procedure: 2D Echo, Cardiac Doppler, Color Doppler and Intracardiac            Opacification Agent Indications:    I50.40* Unspecified combined systolic (congestive) and diastolic                 (congestive) heart failure  History:        Patient has no prior history of Echocardiogram examinations.                 Cardiomyopathy, CAD; Risk Factors:Current Smoker, Diabetes and                 Hypertension. Cancer.  Sonographer:    Sheralyn Boatman RDCS Referring Phys: [id number] DAVID MANUEL ORTIZ  Sonographer Comments: Technically difficult study due to poor echo  windows and patient is obese. Image acquisition challenging due to patient body habitus. IMPRESSIONS  1. Left ventricular ejection fraction, by estimation, is 55 to 60%. The left ventricle has normal function. The left ventricle has no regional wall motion abnormalities. There is moderate left ventricular hypertrophy. Left ventricular diastolic parameters are consistent with Grade I diastolic dysfunction (impaired relaxation). Elevated left atrial pressure.  2. Right ventricular systolic function is normal. The right ventricular size is normal.  3. The mitral valve is normal in structure. No evidence of mitral valve regurgitation. No evidence of mitral stenosis.  4. The aortic valve is tricuspid. Aortic valve regurgitation is not visualized. No aortic stenosis is present.  5.  Aortic dilatation noted. There is borderline dilatation of the aortic root, measuring 39 mm. There is mild dilatation of the ascending aorta, measuring 43 mm. There is mild dilatation of the aortic arch, measuring 39 mm.  6. The inferior vena cava is normal in size with greater than 50% respiratory variability, suggesting right atrial pressure of 3 mmHg. Comparison(s): No prior Echocardiogram. FINDINGS  Left Ventricle: Left ventricular ejection fraction, by estimation, is 55 to 60%. The left ventricle has normal function. The left ventricle has no regional wall motion abnormalities. Definity contrast agent was given IV to delineate the left ventricular  endocardial borders. The left ventricular internal cavity size was normal in size. There is moderate left ventricular hypertrophy. Left ventricular diastolic parameters are consistent with Grade I diastolic dysfunction (impaired relaxation). Elevated left atrial pressure. Right Ventricle: The right ventricular size is normal. Right ventricular systolic function is normal. Left Atrium: Left atrial size was normal in size. Right Atrium: Right atrial size was normal in size. Pericardium: There is  no evidence of pericardial effusion. Mitral Valve: The mitral valve is normal in structure. No evidence of mitral valve regurgitation. No evidence of mitral valve stenosis. Tricuspid Valve: The tricuspid valve is normal in structure. Tricuspid valve regurgitation is trivial. No evidence of tricuspid stenosis. Aortic Valve: The aortic valve is tricuspid. Aortic valve regurgitation is not visualized. No aortic stenosis is present. Pulmonic Valve: The pulmonic valve was normal in structure. Pulmonic valve regurgitation is not visualized. No evidence of pulmonic stenosis. Aorta: Aortic dilatation noted. There is borderline dilatation of the aortic root, measuring 39 mm. There is mild dilatation of the ascending aorta, measuring 43 mm. There is mild dilatation of the aortic arch, measuring 39 mm. Venous: The inferior vena cava is normal in size with greater than 50% respiratory variability, suggesting right atrial pressure of 3 mmHg. IAS/Shunts: No atrial level shunt detected by color flow Doppler.  LEFT VENTRICLE PLAX 2D LVIDd:         5.30 cm      Diastology LVIDs:         3.90 cm      LV e' medial:    4.03 cm/s LV PW:         1.30 cm      LV E/e' medial:  20.3 LV IVS:        1.70 cm      LV e' lateral:   6.42 cm/s LVOT diam:     2.90 cm      LV E/e' lateral: 12.8 LV SV:         118 LV SV Index:   49 LVOT Area:     6.61 cm  LV Volumes (MOD) LV vol d, MOD A2C: 132.7 ml LV vol d, MOD A4C: 172.0 ml LV vol s, MOD A2C: 50.4 ml LV vol s, MOD A4C: 97.8 ml LV SV MOD A2C:     82.3 ml LV SV MOD A4C:     172.0 ml LV SV MOD BP:      79.3 ml RIGHT VENTRICLE             IVC RV S prime:     10.10 cm/s  IVC diam: 2.20 cm TAPSE (M-mode): 1.6 cm LEFT ATRIUM             Index        RIGHT ATRIUM           Index LA diam:  3.10 cm 1.29 cm/m   RA Area:     12.00 cm LA Vol (A2C):   36.8 ml 15.28 ml/m  RA Volume:   27.40 ml  11.38 ml/m LA Vol (A4C):   25.8 ml 10.71 ml/m LA Biplane Vol: 32.1 ml 13.33 ml/m  AORTIC VALVE LVOT Vmax:    115.00 cm/s LVOT Vmean:  70.000 cm/s LVOT VTI:    0.179 m  AORTA Ao Root diam: 3.90 cm Ao Asc diam:  4.13 cm MITRAL VALVE                TRICUSPID VALVE MV Area (PHT): 4.31 cm     TV Peak grad:   6.1 mmHg MV Decel Time: 176 msec     TV Mean grad:   4.0 mmHg MV E velocity: 81.90 cm/s   TV Vmax:        1.23 m/s MV A velocity: 100.00 cm/s  TV Vmean:       92.0 cm/s MV E/A ratio:  0.82         TV VTI:         0.32 msec                              SHUNTS                             Systemic VTI:  0.18 m                             Systemic Diam: 2.90 cm Olga Millers MD Electronically signed by Olga Millers MD Signature Date/Time: 02/25/2023/4:02:59 PM    Final    DG C-Arm 1-60 Min-No Report  Result Date: 02/25/2023 Fluoroscopy was utilized by the requesting physician.  No radiographic interpretation.   CT Renal Stone Study  Result Date: 02/24/2023 CLINICAL DATA:  Flank pain. EXAM: CT ABDOMEN AND PELVIS WITHOUT CONTRAST TECHNIQUE: Multidetector CT imaging of the abdomen and pelvis was performed following the standard protocol without IV contrast. RADIATION DOSE REDUCTION: This exam was performed according to the departmental dose-optimization program which includes automated exposure control, adjustment of the mA and/or kV according to patient size and/or use of iterative reconstruction technique. COMPARISON:  CT 02/18/2023 FINDINGS: Lower chest: Mild lingular atelectasis or scar. No pleural effusion lung bases. Coronary artery calcifications are seen. Hepatobiliary: Fatty liver infiltration.  Gallbladder is contracted. Pancreas: Unremarkable. No pancreatic ductal dilatation or surrounding inflammatory changes. Spleen: Normal in size without focal abnormality. Adrenals/Urinary Tract: The adrenal glands are preserved. Persistent mild right-sided renal collecting system dilatation. Punctate nonobstructing lower pole right-sided renal stone. Previously there is a small stone at the right distal ureter just  proximal to the UVJ. There is suggestion of a stone this location again but this areas obscured by the extensive streak artifact from the bilateral hip arthroplasties. However there is new left-sided collecting system dilatation with what appears to be a stone in the extreme distal left ureter on series 2, image 90. Previously there was a 4 mm stone in the left kidney which is no longer seen in the left kidney today. There are some separate punctate nonobstructing lower pole left-sided stones. Again the bladder is obscured. What is seen has some slight wall thickening and stranding. Stomach/Bowel: Large bowel has a normal course and caliber with scattered stool. Normal appendix. Moderate fluid and  debris in the stomach. The small bowel is nondilated. Vascular/Lymphatic: Aortic atherosclerosis. No enlarged abdominal or pelvic lymph nodes. Reproductive: Prostate is obscured. Other: No free air or free fluid. Musculoskeletal: Degenerative changes along the spine and pelvis. Multilevel disc bulging along the lower lumbar spine with some stenosis. Trace retrolisthesis of L4-5 and L5-S1. Again bilateral hip arthroplasties are seen with significant streak artifact obscuring significant portions of the pelvis. IMPRESSION: Persistent right-sided renal collecting system mild dilatation. The distal right ureteral stone is felt to be present but the bladder and distal ureters are poorly seen with the extensive streak artifact from the bilateral hip arthroplasties. However there is new collecting system dilatation of the left kidney and ureter with some perinephric stranding. The previous 4 mm stone in the left renal pelvis is now no longer identified. Please correlate for the stone being in the distal ureter. In the setting of potential distal bilateral ureteral obstruction, please correlate with patient's renal function and consider urologic consultation Slight wall thickening of the visualized portions of the urinary bladder  with stranding. Fatty liver infiltration. Electronically Signed   By: Karen Kays M.D.   On: 02/24/2023 15:05     LOS: 1 day   Lanae Boast, MD [facility]  02/26/2023, 8:14 AM

## 2023-02-26 NOTE — Discharge Summary (Signed)
Physician Discharge Summary  Tyler [name] NGE:[id number] DOB: December 02, 1975 DOA: 02/24/2023  PCP: Sonny Masters, FNP  Admit date: 02/24/2023 Discharge date: 02/26/2023 Recommendations for Outpatient Follow-up:  Follow up with PCP in 1 weeks-call for appointment Please obtain BMP/CBC in one week Fu with urology  Discharge Dispo:Home Discharge Condition: Stable Code Status:   Code Status: Full Code Diet recommendation:  Diet Order             Diet regular Room service appropriate? Yes; Fluid consistency: Thin  Diet effective now                    Brief/Interim Summary: 47 y.o.mw/ hx of osteoarthritis,NICM/chronic combined systolic/diastolic CHF, CAD, t2dm, gout, hyperlipidemia, hypertension, hypogonadism, thyroid cancer, postsurgical hypothyroidism,OSA,recent admission 8/29-8/31/2024 due to AKI /Right ureteral stone returned to the ED due to uncontrolled hypertension, right flank pain and hematuria. ED course: Initial vital signs were temperature 97.6 F, pulse 82, respirations 16, BP 181/130 mmHg O2 sat 100% on room air.  S/P hydralazine 10 mg IVP and labetalol 20 mg x 3 doses as needed was ordered. Lab work: His urine analysis showed moderate hemoglobin, small leukocyte esterase and rare bacteria.  CBC with a white count of 9.5, hemoglobin 12.8 g/dL and platelets 401.  TSH 7.002 IU/mL.  CMP showed a glucose of 211, BUN 22 and creatinine 2.40 mg/dL B/L Creat was 0.27 mg/dL 4 days PTA Imaging: CT renal study showed persistent right-sided renal collecting system mild dilatation.  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  However there is a new Cholestin system dilatation of the left kidney and ureter with some perinephric stranding.  The previous 4 mm stone in the left renal pelvis is no longer identified.  Fatty liver infiltration. Urology  consulted and admitted> underwent cystoscopy removal of left ureteral stent with insertion of right ureteral stent.renal function improving and patient wanting to go home, repeat bmp is further better we will dc him home, he will need to f/u with urology as OP    Discharge Diagnoses:  Principal Problem:   Hypertensive urgency Active Problems:   Tobacco abuse   Postsurgical hypothyroidism   Type 2 diabetes mellitus with hyperglycemia, without long-term current use of insulin (HCC)   Nonischemic cardiomyopathy (HCC)   Mild sleep apnea   Morbid obesity (HCC)   Coronary artery disease involving native coronary artery of native heart without angina pectoris   AKI (acute kidney injury) (HCC)   Chronic combined systolic and diastolic CHF (congestive heart failure) (HCC)   Urolithiasis  AKI with obstructive uropathy Metabolic acidosis Likely bilateral ureteral stone Recent admission with AKI and right ureteral stone 8/29-8/31: underwent cystoscopy removal of left ureteral stent with insertion of right ureteral stent, now creatinine is improving. Manage antibiotics, IV fluids>Renal functionurology as cleared the patient, repeat lab work shows creatinine much better at 1.6 patient feels comfortable going home today Recent Labs    09/25/22 1214 02/03/23 1459 02/18/23 1530 02/18/23 1754 02/19/23 0359 02/20/23 0416 02/24/23 1239 02/25/23 0822 02/26/23 0424 02/26/23 1425  BUN 11 8 17 17 16 17  22* 23* 24* 24*  CREATININE 0.80 1.04 2.23* 2.03* 1.84* 1.66* 2.40* 2.88* 2.08* 1.64*  CO2 21 19* 21* 24 20* 21* 23 21* 21* 22    Hypertension urgency: Blood pressure much improved now, poor control likely in the setting of pain .  Continue Coreg, hydralazine amlodipine 5 mg consider increasing to 10 mg if remains elevated.   CAD: No chest pain, on aspirin and Coreg-resume  NICM Chronic combined systolic and diastolic dysfunction: Holding Entresto due to AKI , continue Coreg.Repeat echo has  been ordered> shows EF 55-60% moderate LVH G1 DD.  Monitor volume status  Hypothyroidism postsurgical: Continue home levothyroxine 200 mcg.  TSH / ft abnormal could be in the setting of acute illness so will need to be rechecked in 2 weeks and adjust Synthroid 4   T2DM without long-term use of insulin: Recent HbA1c stable 7.2, continue on SSI holding OHA-blood sugars poorly controlled> patient will continue use OHA, resume metformin likely early next week as creatinine continues to get better but will need to repeat blood work which she has been advised Recent Labs  Lab 02/25/23 1548 02/25/23 2215 02/26/23 0341 02/26/23 0744 02/26/23 1142  GLUCAP 291* 345* 217* 199* 254*   OSA: Not on CPAP.  Tobacco use: Cessation will be advised  Morbid obesity:Patient's Body mass index is 40.81 kg/m. : Will benefit with PCP follow-up, weight loss/healthy lifestyle    Consults: urology Subjective: Aaox3, feels ready to go home  Discharge Exam: Vitals:   02/26/23 0927 02/26/23 1253  BP: (!) 154/105 (!) 148/99  Pulse: 86 83  Resp: 16 18  Temp: 98 F (36.7 C) 98.1 F (36.7 C)  SpO2: 100% 100%   General: Pt is alert, awake, not in acute distress Cardiovascular: RRR, S1/S2 +, no rubs, no gallops Respiratory: CTA bilaterally, no wheezing, no rhonchi Abdominal: Soft, NT, ND, bowel sounds + Extremities: no edema, no cyanosis  Discharge Instructions  Discharge Instructions     Discharge instructions   Complete by: As directed    Follow up with urology for stent.  Please call call MD or return to ER for similar or worsening recurring problem that brought you to hospital or if any fever,nausea/vomiting,abdominal pain, uncontrolled pain, chest pain,  shortness of breath or any other alarming symptoms.  Please follow-up your doctor as instructed in a week time and call the office for appointment.  Please avoid alcohol, smoking, or any other illicit substance and maintain healthy habits  including taking your regular medications as prescribed.  You were cared for by a hospitalist during your hospital stay. If you have any questions about your discharge medications or the care you received while you were in the hospital after you are discharged, you can call the unit and ask to speak with the hospitalist on call if the hospitalist that took care of you is not available.  Once you are discharged, your primary care physician will handle any further medical issues. Please note that NO REFILLS for any discharge medications will be authorized once you are discharged, as it is imperative that you return to your primary care physician (or establish a relationship with a primary care physician if you do not have one) for your aftercare needs so that they can reassess your need for medications and monitor your lab values   Increase activity slowly   Complete by: As directed       Allergies as of 02/26/2023       Reactions   Isosorbide Other (See Comments)   Lowers the blood pressure too much        Medication List     STOP taking these medications    celecoxib 200 MG capsule Commonly known as: CELEBREX   Entresto 97-103 MG Generic drug: sacubitril-valsartan  metFORMIN 1000 MG tablet Commonly known as: GLUCOPHAGE       TAKE these medications    amLODipine 5 MG tablet Commonly known as: NORVASC Take 5 mg by mouth daily.   aspirin EC 81 MG tablet Take 81 mg by mouth daily.   carvedilol 12.5 MG tablet Commonly known as: COREG Take 1 tablet (12.5 mg total) by mouth 2 (two) times daily with a meal.   cefadroxil 500 MG capsule Commonly known as: DURICEF Take 2 capsules (1,000 mg total) by mouth 2 (two) times daily for 7 days.   colchicine 0.6 MG tablet TAKE 2 TABLETS AT ONSET, THEN 1 TABLET 2 HOURS LATER- A MAXIMUM OF 3 TABLETS IN 24 HOURS What changed: See the new instructions.   hydrALAZINE 25 MG tablet Commonly known as: APRESOLINE Take 25 mg by mouth 3  (three) times daily.   levothyroxine 200 MCG tablet Commonly known as: SYNTHROID Take 200 mcg by mouth daily before breakfast.   magnesium oxide 400 (240 Mg) MG tablet Commonly known as: MAG-OX Take 400 mg by mouth at bedtime.   methocarbamol 500 MG tablet Commonly known as: ROBAXIN Take 1 tablet (500 mg total) by mouth every 6 (six) hours as needed for muscle spasms.   mupirocin ointment 2 % Commonly known as: BACTROBAN Apply 1 Application topically 2 (two) times daily as needed (as directed for irritation- affected areas).   nystatin cream Commonly known as: MYCOSTATIN Apply 1 Application topically 2 (two) times daily as needed (as directed for irritation- affected areas).   nystatin powder Commonly known as: MYCOSTATIN/NYSTOP Apply 1 Application topically 2 (two) times daily as needed (as directed for irritation- affected areas).   omega-3 acid ethyl esters 1 g capsule Commonly known as: LOVAZA TAKE 2 CAPSULES BY MOUTH TWICE A DAY What changed:  how much to take how to take this when to take this additional instructions   oxyCODONE 5 MG immediate release tablet Commonly known as: Oxy IR/ROXICODONE Take 5 mg by mouth every 4 (four) hours as needed for severe pain.   pravastatin 40 MG tablet Commonly known as: PRAVACHOL TAKE 1 TABLET BY MOUTH EVERY DAY   tamsulosin 0.4 MG Caps capsule Commonly known as: FLOMAX Take 1 capsule (0.4 mg total) by mouth daily.   TURMERIC CURCUMIN PO Take 1,000 mg by mouth 2 (two) times daily.   TYLENOL 500 MG tablet Generic drug: acetaminophen Take 1,000 mg by mouth every 6 (six) hours as needed for mild pain or headache.   Vitamin D (Ergocalciferol) 1.25 MG (50000 UNIT) Caps capsule Commonly known as: DRISDOL Take 50,000 Units by mouth every Sunday.        Follow-up Information     Sonny Masters, FNP Follow up in 1 week(s).   Specialty: Family Medicine Contact information: [address] [place]  [address] [phone]                Allergies  Allergen Reactions   Isosorbide Other (See Comments)    Lowers the blood pressure too much    The results of significant diagnostics from this hospitalization (including imaging, microbiology, ancillary and laboratory) are listed below for reference.    Microbiology: Recent Results (from the past 240 hour(s))  Urine Culture     Status: None   Collection Time: 02/18/23  5:54 PM   Specimen: Urine, Clean Catch  Result Value Ref Range Status   Specimen Description   Final    URINE, CLEAN CATCH Performed at [facility], [address] [place], [place] [address]    Special Requests   Final    NONE Performed at [facility], [address] [place], [place] [address]    Culture   Final    NO GROWTH Performed at [facility], [address] [place], [place] [address]    Report Status 02/19/2023 FINAL  Final  Urine Culture (for pregnant, neutropenic or urologic patients or patients with an indwelling urinary catheter)     Status: None   Collection Time: 02/24/23 11:52 AM   Specimen: Urine, Clean Catch  Result Value Ref Range Status   Specimen Description   Final    URINE, CLEAN CATCH Performed at [facility], [address], [place], [place] [address]    Special Requests   Final    NONE Performed at [facility], [address], [place], [place] [address]    Culture   Final    NO GROWTH Performed at [facility], [address] [place], [place] [address]    Report Status 02/26/2023 FINAL  Final  MRSA Next Gen by PCR, Nasal     Status: None   Collection Time: 02/24/23  6:18 PM   Specimen: Nasal Mucosa; Nasal Swab  Result Value Ref Range Status   MRSA by PCR Next Gen NOT DETECTED NOT DETECTED Final    Comment: (NOTE) The GeneXpert MRSA Assay (FDA approved for NASAL specimens only), is one component of a comprehensive MRSA colonization  surveillance program. It is not intended to diagnose MRSA infection nor to guide or monitor treatment for MRSA infections. Test performance is not FDA approved in patients less than 80 years old. Performed at [facility], [address], [place], [place] [address]     Procedures/Studies: ECHOCARDIOGRAM COMPLETE  Result Date: 02/25/2023    ECHOCARDIOGRAM REPORT   Patient Name:   Starr CHRISTIANJACOB MCCREDIE Date of Exam: 02/25/2023 Medical Rec #:  [id number]      Height:       70.0 in Accession #:    [id number]     Weight:       280.0 lb Date of Birth:  Apr 20, 1976      BSA:          2.408 m Patient Age:    46 years       BP:           181/123 mmHg Patient Gender: M              HR:           89 bpm. Exam Location:  Inpatient Procedure: 2D Echo, Cardiac Doppler, Color Doppler and Intracardiac            Opacification Agent Indications:    I50.40* Unspecified combined systolic (congestive) and diastolic                 (congestive) heart failure  History:        Patient has no prior history of Echocardiogram examinations.                 Cardiomyopathy, CAD; Risk Factors:Current Smoker, Diabetes and                 Hypertension. Cancer.  Sonographer:    Sheralyn Boatman RDCS Referring Phys: [id number] DAVID MANUEL ORTIZ  Sonographer Comments: Technically difficult study due to poor echo windows and patient is obese. Image  acquisition challenging due to patient body habitus. IMPRESSIONS  1. Left ventricular ejection fraction, by estimation, is 55 to 60%. The left ventricle has normal function. The left ventricle has no regional wall motion abnormalities. There is moderate left ventricular hypertrophy. Left ventricular diastolic parameters are consistent with Grade I diastolic dysfunction (impaired relaxation). Elevated left atrial pressure.  2. Right ventricular systolic function is normal. The right ventricular size is normal.  3. The mitral valve is normal in structure. No evidence of mitral valve regurgitation. No  evidence of mitral stenosis.  4. The aortic valve is tricuspid. Aortic valve regurgitation is not visualized. No aortic stenosis is present.  5. Aortic dilatation noted. There is borderline dilatation of the aortic root, measuring 39 mm. There is mild dilatation of the ascending aorta, measuring 43 mm. There is mild dilatation of the aortic arch, measuring 39 mm.  6. The inferior vena cava is normal in size with greater than 50% respiratory variability, suggesting right atrial pressure of 3 mmHg. Comparison(s): No prior Echocardiogram. FINDINGS  Left Ventricle: Left ventricular ejection fraction, by estimation, is 55 to 60%. The left ventricle has normal function. The left ventricle has no regional wall motion abnormalities. Definity contrast agent was given IV to delineate the left ventricular  endocardial borders. The left ventricular internal cavity size was normal in size. There is moderate left ventricular hypertrophy. Left ventricular diastolic parameters are consistent with Grade I diastolic dysfunction (impaired relaxation). Elevated left atrial pressure. Right Ventricle: The right ventricular size is normal. Right ventricular systolic function is normal. Left Atrium: Left atrial size was normal in size. Right Atrium: Right atrial size was normal in size. Pericardium: There is no evidence of pericardial effusion. Mitral Valve: The mitral valve is normal in structure. No evidence of mitral valve regurgitation. No evidence of mitral valve stenosis. Tricuspid Valve: The tricuspid valve is normal in structure. Tricuspid valve regurgitation is trivial. No evidence of tricuspid stenosis. Aortic Valve: The aortic valve is tricuspid. Aortic valve regurgitation is not visualized. No aortic stenosis is present. Pulmonic Valve: The pulmonic valve was normal in structure. Pulmonic valve regurgitation is not visualized. No evidence of pulmonic stenosis. Aorta: Aortic dilatation noted. There is borderline dilatation of  the aortic root, measuring 39 mm. There is mild dilatation of the ascending aorta, measuring 43 mm. There is mild dilatation of the aortic arch, measuring 39 mm. Venous: The inferior vena cava is normal in size with greater than 50% respiratory variability, suggesting right atrial pressure of 3 mmHg. IAS/Shunts: No atrial level shunt detected by color flow Doppler.  LEFT VENTRICLE PLAX 2D LVIDd:         5.30 cm      Diastology LVIDs:         3.90 cm      LV e' medial:    4.03 cm/s LV PW:         1.30 cm      LV E/e' medial:  20.3 LV IVS:        1.70 cm      LV e' lateral:   6.42 cm/s LVOT diam:     2.90 cm      LV E/e' lateral: 12.8 LV SV:         118 LV SV Index:   49 LVOT Area:     6.61 cm  LV Volumes (MOD) LV vol d, MOD A2C: 132.7 ml LV vol d, MOD A4C: 172.0 ml LV vol s, MOD A2C: 50.4 ml LV vol  s, MOD A4C: 97.8 ml LV SV MOD A2C:     82.3 ml LV SV MOD A4C:     172.0 ml LV SV MOD BP:      79.3 ml RIGHT VENTRICLE             IVC RV S prime:     10.10 cm/s  IVC diam: 2.20 cm TAPSE (M-mode): 1.6 cm LEFT ATRIUM             Index        RIGHT ATRIUM           Index LA diam:        3.10 cm 1.29 cm/m   RA Area:     12.00 cm LA Vol (A2C):   36.8 ml 15.28 ml/m  RA Volume:   27.40 ml  11.38 ml/m LA Vol (A4C):   25.8 ml 10.71 ml/m LA Biplane Vol: 32.1 ml 13.33 ml/m  AORTIC VALVE LVOT Vmax:   115.00 cm/s LVOT Vmean:  70.000 cm/s LVOT VTI:    0.179 m  AORTA Ao Root diam: 3.90 cm Ao Asc diam:  4.13 cm MITRAL VALVE                TRICUSPID VALVE MV Area (PHT): 4.31 cm     TV Peak grad:   6.1 mmHg MV Decel Time: 176 msec     TV Mean grad:   4.0 mmHg MV E velocity: 81.90 cm/s   TV Vmax:        1.23 m/s MV A velocity: 100.00 cm/s  TV Vmean:       92.0 cm/s MV E/A ratio:  0.82         TV VTI:         0.32 msec                              SHUNTS                             Systemic VTI:  0.18 m                             Systemic Diam: 2.90 cm Olga Millers MD Electronically signed by Olga Millers MD Signature Date/Time:  02/25/2023/4:02:59 PM    Final    DG C-Arm 1-60 Min-No Report  Result Date: 02/25/2023 Fluoroscopy was utilized by the requesting physician.  No radiographic interpretation.   CT Renal Stone Study  Result Date: 02/24/2023 CLINICAL DATA:  Flank pain. EXAM: CT ABDOMEN AND PELVIS WITHOUT CONTRAST TECHNIQUE: Multidetector CT imaging of the abdomen and pelvis was performed following the standard protocol without IV contrast. RADIATION DOSE REDUCTION: This exam was performed according to the departmental dose-optimization program which includes automated exposure control, adjustment of the mA and/or kV according to patient size and/or use of iterative reconstruction technique. COMPARISON:  CT 02/18/2023 FINDINGS: Lower chest: Mild lingular atelectasis or scar. No pleural effusion lung bases. Coronary artery calcifications are seen. Hepatobiliary: Fatty liver infiltration.  Gallbladder is contracted. Pancreas: Unremarkable. No pancreatic ductal dilatation or surrounding inflammatory changes. Spleen: Normal in size without focal abnormality. Adrenals/Urinary Tract: The adrenal glands are preserved. Persistent mild right-sided renal collecting system dilatation. Punctate nonobstructing lower pole right-sided renal stone. Previously there is a small stone at the right distal ureter just proximal to the UVJ.  There is suggestion of a stone this location again but this areas obscured by the extensive streak artifact from the bilateral hip arthroplasties. However there is new left-sided collecting system dilatation with what appears to be a stone in the extreme distal left ureter on series 2, image 90. Previously there was a 4 mm stone in the left kidney which is no longer seen in the left kidney today. There are some separate punctate nonobstructing lower pole left-sided stones. Again the bladder is obscured. What is seen has some slight wall thickening and stranding. Stomach/Bowel: Large bowel has a normal course and  caliber with scattered stool. Normal appendix. Moderate fluid and debris in the stomach. The small bowel is nondilated. Vascular/Lymphatic: Aortic atherosclerosis. No enlarged abdominal or pelvic lymph nodes. Reproductive: Prostate is obscured. Other: No free air or free fluid. Musculoskeletal: Degenerative changes along the spine and pelvis. Multilevel disc bulging along the lower lumbar spine with some stenosis. Trace retrolisthesis of L4-5 and L5-S1. Again bilateral hip arthroplasties are seen with significant streak artifact obscuring significant portions of the pelvis. IMPRESSION: Persistent right-sided renal collecting system mild dilatation. The distal right ureteral stone is felt to be present but the bladder and distal ureters are poorly seen with the extensive streak artifact from the bilateral hip arthroplasties. However there is new collecting system dilatation of the left kidney and ureter with some perinephric stranding. The previous 4 mm stone in the left renal pelvis is now no longer identified. Please correlate for the stone being in the distal ureter. In the setting of potential distal bilateral ureteral obstruction, please correlate with patient's renal function and consider urologic consultation Slight wall thickening of the visualized portions of the urinary bladder with stranding. Fatty liver infiltration. Electronically Signed   By: Karen Kays M.D.   On: 02/24/2023 15:05   US RENAL  Result Date: 02/20/2023 CLINICAL DATA:  Ureteral calculi. EXAM: RENAL / URINARY TRACT ULTRASOUND COMPLETE COMPARISON:  CT abdomen pelvis dated 02/18/2023. FINDINGS: Right Kidney: Renal measurements: 13.3 x 6.2 x 7.3 cm = volume: 316 mL. Echogenicity within normal limits. No mass or hydronephrosis visualized. Left Kidney: Renal measurements: 12.2 x 5.3 x 7.0 cm = volume: 237 mL. Echogenicity within normal limits. No mass or hydronephrosis visualized. Bladder: Appears normal for degree of bladder distention.  Other: None. IMPRESSION: No hydronephrosis. Electronically Signed   By: Romona Curls M.D.   On: 02/20/2023 13:45   CT Renal Stone Study  Result Date: 02/18/2023 CLINICAL DATA:  Blood in urine for 3 days.  Lower abdominal pain EXAM: CT ABDOMEN AND PELVIS WITHOUT CONTRAST TECHNIQUE: Multidetector CT imaging of the abdomen and pelvis was performed following the standard protocol without IV contrast. RADIATION DOSE REDUCTION: This exam was performed according to the departmental dose-optimization program which includes automated exposure control, adjustment of the mA and/or kV according to patient size and/or use of iterative reconstruction technique. COMPARISON:  None Available. FINDINGS: Lower chest: Slight linear opacity seen along bases likely scar or atelectasis. No pleural effusion coronary artery calcifications are seen. Coronary artery calcifications are seen. Please correlate for other coronary risk factors. Hepatobiliary: On this non IV contrast exam there is diffuse fatty liver infiltration gallbladder is nondilated. Pancreas: Unremarkable. No pancreatic ductal dilatation or surrounding inflammatory changes. Spleen: Normal in size without focal abnormality. Adrenals/Urinary Tract: Adrenal glands are preserved. There is some perinephric stranding on the right with slight ectasia of the renal collecting system down to the level of the bladder. The low pelvis and bladder  obscured by the significant streak artifact from both hip arthroplasties but there appears to be a tiny stone in the very distal right ureter proximal to the UVJ best seen on coronal series 601, image 78 and axial series 301, image 93. There are some separate nonobstructing left-sided renal stones measuring up to 3-4 mm. No left ureteral stone or collecting system dilatation. Grossly preserved contours of the urinary bladder but again obscured. Stomach/Bowel: On this non oral contrast exam, the large bowel has a normal course and caliber  with some fluid and stool. Normal appendix extends inferior and medial to the cecum in the right lower quadrant. Moderate debris and fluid in the stomach. Small bowel is nondilated. Vascular/Lymphatic: Aortic atherosclerosis. No enlarged abdominal or pelvic lymph nodes. Reproductive: Obscured by the streak artifact. Other: No free intra-air or free fluid. Musculoskeletal: Degenerative changes seen of the spine and pelvis. In particular along the lower lumbar spine with disc bulging and stenosis. Again bilateral hip arthroplasties with associated streak artifact obscuring the surrounding tissues. IMPRESSION: Punctate stone seen in the extreme distal right ureter just proximal to the UVJ. Mild collecting system dilatation proximally with perinephric stranding. Nonobstructing left-sided renal stones. Normal appendix. Portions of the pelvis are obscured by the significant streak artifact from the bilateral hip arthroplasties. Electronically Signed   By: Karen Kays M.D.   On: 02/18/2023 16:57   CT Angio Chest PE W and/or Wo Contrast  Result Date: 02/03/2023 CLINICAL DATA:  Shortness of breath chest pain and coughing EXAM: CT ANGIOGRAPHY CHEST WITH CONTRAST TECHNIQUE: Multidetector CT imaging of the chest was performed using the standard protocol during bolus administration of intravenous contrast. Multiplanar CT image reconstructions and MIPs were obtained to evaluate the vascular anatomy. RADIATION DOSE REDUCTION: This exam was performed according to the departmental dose-optimization program which includes automated exposure control, adjustment of the mA and/or kV according to patient size and/or use of iterative reconstruction technique. CONTRAST:  OMNIPAQUE IOHEXOL 350 MG/ML SOLN COMPARISON:  Chest x-ray 02/03/2023 FINDINGS: Cardiovascular: Satisfactory opacification of the pulmonary arteries to the segmental level. No evidence of pulmonary embolism. Normal heart size. No pericardial effusion. Maximum  ascending aortic diameter of 4.4 cm. Mild atherosclerosis. Mediastinum/Nodes: No enlarged mediastinal, hilar, or axillary lymph nodes. Status post thyroidectomy. Esophagus within normal limits. Lungs/Pleura: Lungs are clear. No pleural effusion or pneumothorax. Upper Abdomen: No acute abnormality. Musculoskeletal: No chest wall abnormality. No acute or significant osseous findings. Review of the MIP images confirms the above findings. IMPRESSION: 1. Negative for acute pulmonary embolus or aortic dissection. 2. Aortic atherosclerosis. Ascending aortic diameter up to 4.4 cm. Recommend annual imaging followup by CTA or MRA. This recommendation follows 2010 ACCF/AHA/AATS/ACR/ASA/SCA/SCAI/SIR/STS/SVM Guidelines for the Diagnosis and Management of Patients with Thoracic Aortic Disease. Circulation. 2010; 121: W237-S283. Aortic aneurysm NOS (ICD10-I71.9) Aortic Atherosclerosis (ICD10-I70.0). Aortic aneurysm NOS (ICD10-I71.9). Electronically Signed   By: Jasmine Pang M.D.   On: 02/03/2023 21:05   DG Chest 2 View  Result Date: 02/03/2023 CLINICAL DATA:  Chest pain, shortness of breath EXAM: CHEST - 2 VIEW COMPARISON:  06/18/2022 FINDINGS: Mild cardiomegaly. Both lungs are clear. The visualized skeletal structures are unremarkable. IMPRESSION: Mild cardiomegaly without acute abnormality of the lungs. Electronically Signed   By: Jearld Lesch M.D.   On: 02/03/2023 15:54    Labs: BNP (last 3 results) Recent Labs    02/03/23 1500  BNP 99.3   Basic Metabolic Panel: Recent Labs  Lab 02/20/23 0416 02/24/23 1239 02/25/23 0822 02/26/23 0424 02/26/23 1425  NA 138 141 138 137 137  K 3.9 4.4 4.4 4.9 4.5  CL 109 108 106 107 106  CO2 21* 23 21* 21* 22  GLUCOSE 155* 211* 157* 241* 288*  BUN 17 22* 23* 24* 24*  CREATININE 1.66* 2.40* 2.88* 2.08* 1.64*  CALCIUM 8.8* 9.0 8.2* 8.3* 8.3*  MG 1.8  --   --   --   --   PHOS 3.9  --   --   --   --    Liver Function Tests: Recent Labs  Lab 02/20/23 0416  02/24/23 1239  AST  --  16  ALT  --  19  ALKPHOS  --  74  BILITOT  --  0.7  PROT  --  7.3  ALBUMIN 3.1* 3.6   No results for input(s): "LIPASE", "AMYLASE" in the last 168 hours. No results for input(s): "AMMONIA" in the last 168 hours. CBC: Recent Labs  Lab 02/20/23 0416 02/24/23 1239 02/25/23 0822 02/26/23 0424  WBC 9.6 9.5 10.5 13.2*  NEUTROABS  --  6.1  --   --   HGB 11.8* 12.8* 12.0* 11.7*  HCT 35.9* 38.6* 36.5* 34.8*  MCV 91.6 90.6 91.0 90.4  PLT 188 246 234 253   Cardiac Enzymes: Recent Labs  Lab 02/20/23 0416  CKTOTAL 52   BNP: Invalid input(s): "POCBNP" CBG: Recent Labs  Lab 02/25/23 1548 02/25/23 2215 02/26/23 0341 02/26/23 0744 02/26/23 1142  GLUCAP 291* 345* 217* 199* 254*   D-Dimer No results for input(s): "DDIMER" in the last 72 hours. Hgb A1c No results for input(s): "HGBA1C" in the last 72 hours. Lipid Profile No results for input(s): "CHOL", "HDL", "LDLCALC", "TRIG", "CHOLHDL", "LDLDIRECT" in the last 72 hours. Thyroid function studies Recent Labs    02/24/23 1239  TSH 7.002*   Anemia work up No results for input(s): "VITAMINB12", "FOLATE", "FERRITIN", "TIBC", "IRON", "RETICCTPCT" in the last 72 hours. Urinalysis    Component Value Date/Time   COLORURINE YELLOW 02/24/2023 1152   APPEARANCEUR CLEAR 02/24/2023 1152   APPEARANCEUR Clear 07/04/2021 1548   LABSPEC 1.011 02/24/2023 1152   PHURINE 5.0 02/24/2023 1152   GLUCOSEU NEGATIVE 02/24/2023 1152   HGBUR MODERATE (A) 02/24/2023 1152   BILIRUBINUR NEGATIVE 02/24/2023 1152   BILIRUBINUR Negative 07/04/2021 1548   KETONESUR NEGATIVE 02/24/2023 1152   PROTEINUR NEGATIVE 02/24/2023 1152   UROBILINOGEN 0.2 12/20/2012 2159   NITRITE NEGATIVE 02/24/2023 1152   LEUKOCYTESUR SMALL (A) 02/24/2023 1152   Sepsis Labs Recent Labs  Lab 02/20/23 0416 02/24/23 1239 02/25/23 0822 02/26/23 0424  WBC 9.6 9.5 10.5 13.2*   Microbiology Recent Results (from the past 240 hour(s))  Urine  Culture     Status: None   Collection Time: 02/18/23  5:54 PM   Specimen: Urine, Clean Catch  Result Value Ref Range Status   Specimen Description   Final    URINE, CLEAN CATCH Performed at [facility], [address] [place], [place] [address]    Special Requests   Final    NONE Performed at [facility], [address] [place], [place] [address]    Culture   Final    NO GROWTH Performed at [facility], [address], [place], [place] [address]    Report Status 02/19/2023 FINAL  Final  Urine Culture (for pregnant, neutropenic or urologic patients or patients with an indwelling urinary catheter)     Status: None   Collection Time: 02/24/23 11:52 AM  Specimen: Urine, Clean Catch  Result Value Ref Range Status   Specimen Description   Final    URINE, CLEAN CATCH Performed at [facility], [address] [place], [place] [address]    Special Requests   Final    NONE Performed at [facility], [address] [place], [place] [address]    Culture   Final    NO GROWTH Performed at [facility], [address] [place], [place] [address]    Report Status 02/26/2023 FINAL  Final  MRSA Next Gen by PCR, Nasal     Status: None   Collection Time: 02/24/23  6:18 PM   Specimen: Nasal Mucosa; Nasal Swab  Result Value Ref Range Status   MRSA by PCR Next Gen NOT DETECTED NOT DETECTED Final    Comment: (NOTE) The GeneXpert MRSA Assay (FDA approved for NASAL specimens only), is one component of a comprehensive MRSA colonization surveillance program. It is not intended to diagnose MRSA infection nor to guide or monitor treatment for MRSA infections. Test performance is not FDA approved in patients less than 17 years old. Performed at [facility], [address], [place], [place] [address]    Time coordinating discharge: 25 minutes  SIGNED: Lanae Boast, MD  [facility] 02/26/2023,  3:06 PM  If 7PM-7AM, please contact night-coverage [email or web address]

## 2023-02-26 NOTE — Progress Notes (Signed)
Mobility Specialist - Progress Note   02/26/23 1042  Mobility  Activity Ambulated independently in hallway  Level of Assistance Independent  Assistive Device None  Distance Ambulated (ft) 350 ft  Activity Response Tolerated well  Mobility Referral Yes  $Mobility charge 1 Mobility  Mobility Specialist Start Time (ACUTE ONLY) 1034  Mobility Specialist Stop Time (ACUTE ONLY) 1042  Mobility Specialist Time Calculation (min) (ACUTE ONLY) 8 min   Pt received in bed and agreeable to mobility. No complaints during session. Pt to bed after session with all needs met.    [facility]

## 2023-02-26 NOTE — Progress Notes (Signed)
1 Day Post-Op Subjective: NAEON. Feels much better this am  Objective: Vital signs in last 24 hours: Temp:  [97.5 F (36.4 C)-98.2 F (36.8 C)] 97.7 F (36.5 C) (09/06 0323) Pulse Rate:  [69-101] 85 (09/06 0323) Resp:  [10-20] 18 (09/06 0323) BP: (120-149)/(77-109) 130/84 (09/06 0323) SpO2:  [94 %-100 %] 97 % (09/06 0323) Weight:  [119 kg] 129 kg (09/05 1903)  Assessment/Plan: # Bilateral ureteral stones suspected Bilateral hip prosthesis obscuring view.  Taken to the OR by Dr. Sherron Monday on 02/25/2023.  Stone extracted on the left and stent placed on the right. Follow-up has been arranged and patient may discharge at the discretion of primary team once medically appropriate.  Intake/Output from previous day: 09/05 0701 - 09/06 0700 In: 5417.7 [P.O.:700; I.V.:4030.4; IV Piggyback:687.3] Out: 300 [Urine:300]  Intake/Output this shift: No intake/output data recorded.  Physical Exam:  General: Alert and oriented CV: No cyanosis Lungs: equal chest rise Gu: clear yellow urine in urinal  Lab Results: Recent Labs    02/24/23 1239 02/25/23 0822 02/26/23 0424  HGB 12.8* 12.0* 11.7*  HCT 38.6* 36.5* 34.8*   BMET Recent Labs    02/25/23 0822 02/26/23 0424  NA 138 137  K 4.4 4.9  CL 106 107  CO2 21* 21*  GLUCOSE 157* 241*  BUN 23* 24*  CREATININE 2.88* 2.08*  CALCIUM 8.2* 8.3*     Studies/Results: ECHOCARDIOGRAM COMPLETE  Result Date: 02/25/2023    ECHOCARDIOGRAM REPORT   Patient Name:   Tyler [name] Date of Exam: 02/25/2023 Medical Rec #:  [id number]      Height:       70.0 in Accession #:    [id number]     Weight:       280.0 lb Date of Birth:  September 29, 1975      BSA:          2.408 m Patient Age:    46 years       BP:           181/123 mmHg Patient Gender: M              HR:           89 bpm. Exam Location:  Inpatient Procedure: 2D Echo, Cardiac Doppler, Color Doppler and Intracardiac            Opacification Agent Indications:    I50.40* Unspecified combined  systolic (congestive) and diastolic                 (congestive) heart failure  History:        Patient has no prior history of Echocardiogram examinations.                 Cardiomyopathy, CAD; Risk Factors:Current Smoker, Diabetes and                 Hypertension. Cancer.  Sonographer:    Sheralyn Boatman RDCS Referring Phys: [id number] DAVID MANUEL ORTIZ  Sonographer Comments: Technically difficult study due to poor echo windows and patient is obese. Image acquisition challenging due to patient body habitus. IMPRESSIONS  1. Left ventricular ejection fraction, by estimation, is 55 to 60%. The left ventricle has normal function. The left ventricle has no regional wall motion abnormalities. There is moderate left ventricular hypertrophy. Left ventricular diastolic parameters are consistent with Grade I diastolic dysfunction (impaired relaxation). Elevated left atrial pressure.  2. Right ventricular systolic function is normal. The right ventricular size is normal.  3. The  mitral valve is normal in structure. No evidence of mitral valve regurgitation. No evidence of mitral stenosis.  4. The aortic valve is tricuspid. Aortic valve regurgitation is not visualized. No aortic stenosis is present.  5. Aortic dilatation noted. There is borderline dilatation of the aortic root, measuring 39 mm. There is mild dilatation of the ascending aorta, measuring 43 mm. There is mild dilatation of the aortic arch, measuring 39 mm.  6. The inferior vena cava is normal in size with greater than 50% respiratory variability, suggesting right atrial pressure of 3 mmHg. Comparison(s): No prior Echocardiogram. FINDINGS  Left Ventricle: Left ventricular ejection fraction, by estimation, is 55 to 60%. The left ventricle has normal function. The left ventricle has no regional wall motion abnormalities. Definity contrast agent was given IV to delineate the left ventricular  endocardial borders. The left ventricular internal cavity size was normal in size.  There is moderate left ventricular hypertrophy. Left ventricular diastolic parameters are consistent with Grade I diastolic dysfunction (impaired relaxation). Elevated left atrial pressure. Right Ventricle: The right ventricular size is normal. Right ventricular systolic function is normal. Left Atrium: Left atrial size was normal in size. Right Atrium: Right atrial size was normal in size. Pericardium: There is no evidence of pericardial effusion. Mitral Valve: The mitral valve is normal in structure. No evidence of mitral valve regurgitation. No evidence of mitral valve stenosis. Tricuspid Valve: The tricuspid valve is normal in structure. Tricuspid valve regurgitation is trivial. No evidence of tricuspid stenosis. Aortic Valve: The aortic valve is tricuspid. Aortic valve regurgitation is not visualized. No aortic stenosis is present. Pulmonic Valve: The pulmonic valve was normal in structure. Pulmonic valve regurgitation is not visualized. No evidence of pulmonic stenosis. Aorta: Aortic dilatation noted. There is borderline dilatation of the aortic root, measuring 39 mm. There is mild dilatation of the ascending aorta, measuring 43 mm. There is mild dilatation of the aortic arch, measuring 39 mm. Venous: The inferior vena cava is normal in size with greater than 50% respiratory variability, suggesting right atrial pressure of 3 mmHg. IAS/Shunts: No atrial level shunt detected by color flow Doppler.  LEFT VENTRICLE PLAX 2D LVIDd:         5.30 cm      Diastology LVIDs:         3.90 cm      LV e' medial:    4.03 cm/s LV PW:         1.30 cm      LV E/e' medial:  20.3 LV IVS:        1.70 cm      LV e' lateral:   6.42 cm/s LVOT diam:     2.90 cm      LV E/e' lateral: 12.8 LV SV:         118 LV SV Index:   49 LVOT Area:     6.61 cm  LV Volumes (MOD) LV vol d, MOD A2C: 132.7 ml LV vol d, MOD A4C: 172.0 ml LV vol s, MOD A2C: 50.4 ml LV vol s, MOD A4C: 97.8 ml LV SV MOD A2C:     82.3 ml LV SV MOD A4C:     172.0 ml LV SV  MOD BP:      79.3 ml RIGHT VENTRICLE             IVC RV S prime:     10.10 cm/s  IVC diam: 2.20 cm TAPSE (M-mode): 1.6 cm LEFT ATRIUM  Index        RIGHT ATRIUM           Index LA diam:        3.10 cm 1.29 cm/m   RA Area:     12.00 cm LA Vol (A2C):   36.8 ml 15.28 ml/m  RA Volume:   27.40 ml  11.38 ml/m LA Vol (A4C):   25.8 ml 10.71 ml/m LA Biplane Vol: 32.1 ml 13.33 ml/m  AORTIC VALVE LVOT Vmax:   115.00 cm/s LVOT Vmean:  70.000 cm/s LVOT VTI:    0.179 m  AORTA Ao Root diam: 3.90 cm Ao Asc diam:  4.13 cm MITRAL VALVE                TRICUSPID VALVE MV Area (PHT): 4.31 cm     TV Peak grad:   6.1 mmHg MV Decel Time: 176 msec     TV Mean grad:   4.0 mmHg MV E velocity: 81.90 cm/s   TV Vmax:        1.23 m/s MV A velocity: 100.00 cm/s  TV Vmean:       92.0 cm/s MV E/A ratio:  0.82         TV VTI:         0.32 msec                              SHUNTS                             Systemic VTI:  0.18 m                             Systemic Diam: 2.90 cm Olga Millers MD Electronically signed by Olga Millers MD Signature Date/Time: 02/25/2023/4:02:59 PM    Final    DG C-Arm 1-60 Min-No Report  Result Date: 02/25/2023 Fluoroscopy was utilized by the requesting physician.  No radiographic interpretation.   CT Renal Stone Study  Result Date: 02/24/2023 CLINICAL DATA:  Flank pain. EXAM: CT ABDOMEN AND PELVIS WITHOUT CONTRAST TECHNIQUE: Multidetector CT imaging of the abdomen and pelvis was performed following the standard protocol without IV contrast. RADIATION DOSE REDUCTION: This exam was performed according to the departmental dose-optimization program which includes automated exposure control, adjustment of the mA and/or kV according to patient size and/or use of iterative reconstruction technique. COMPARISON:  CT 02/18/2023 FINDINGS: Lower chest: Mild lingular atelectasis or scar. No pleural effusion lung bases. Coronary artery calcifications are seen. Hepatobiliary: Fatty liver infiltration.   Gallbladder is contracted. Pancreas: Unremarkable. No pancreatic ductal dilatation or surrounding inflammatory changes. Spleen: Normal in size without focal abnormality. Adrenals/Urinary Tract: The adrenal glands are preserved. Persistent mild right-sided renal collecting system dilatation. Punctate nonobstructing lower pole right-sided renal stone. Previously there is a small stone at the right distal ureter just proximal to the UVJ. There is suggestion of a stone this location again but this areas obscured by the extensive streak artifact from the bilateral hip arthroplasties. However there is new left-sided collecting system dilatation with what appears to be a stone in the extreme distal left ureter on series 2, image 90. Previously there was a 4 mm stone in the left kidney which is no longer seen in the left kidney today. There are some separate punctate nonobstructing lower pole left-sided stones. Again the bladder  is obscured. What is seen has some slight wall thickening and stranding. Stomach/Bowel: Large bowel has a normal course and caliber with scattered stool. Normal appendix. Moderate fluid and debris in the stomach. The small bowel is nondilated. Vascular/Lymphatic: Aortic atherosclerosis. No enlarged abdominal or pelvic lymph nodes. Reproductive: Prostate is obscured. Other: No free air or free fluid. Musculoskeletal: Degenerative changes along the spine and pelvis. Multilevel disc bulging along the lower lumbar spine with some stenosis. Trace retrolisthesis of L4-5 and L5-S1. Again bilateral hip arthroplasties are seen with significant streak artifact obscuring significant portions of the pelvis. IMPRESSION: Persistent right-sided renal collecting system mild dilatation. The distal right ureteral stone is felt to be present but the bladder and distal ureters are poorly seen with the extensive streak artifact from the bilateral hip arthroplasties. However there is new collecting system dilatation of  the left kidney and ureter with some perinephric stranding. The previous 4 mm stone in the left renal pelvis is now no longer identified. Please correlate for the stone being in the distal ureter. In the setting of potential distal bilateral ureteral obstruction, please correlate with patient's renal function and consider urologic consultation Slight wall thickening of the visualized portions of the urinary bladder with stranding. Fatty liver infiltration. Electronically Signed   By: Karen Kays M.D.   On: 02/24/2023 15:05      LOS: 1 day   Elmon Kirschner, NP [facility] Pager: [phone]  02/26/2023, 9:20 AM

## 2023-03-01 ENCOUNTER — Telehealth: Payer: Self-pay

## 2023-03-01 NOTE — Transitions of Care (Post Inpatient/ED Visit) (Signed)
03/01/2023  Name: Tyler [name] MRN: [id number] DOB: Oct 31, 1975  Today's TOC FU Call Status: Today's TOC FU Call Status:: Successful TOC FU Call Completed TOC FU Call Complete Date: 03/01/23 Patient's Name and Date of Birth confirmed.  Transition Care Management Follow-up Telephone Call Date of Discharge: 02/26/23 Discharge Facility: [facility]) Type of Discharge: Inpatient Admission Primary Inpatient Discharge Diagnosis:: Billateral kidney stones How have you been since you were released from the hospital?: Same Any questions or concerns?: Yes Patient Questions/Concerns:: pain in the right kidney   2/10 Patient Questions/Concerns Addressed: Provided Patient Educational Materials  Items Reviewed: Did you receive and understand the discharge instructions provided?: Yes Medications obtained,verified, and reconciled?: Yes (Medications Reviewed) Any new allergies since your discharge?: No Dietary orders reviewed?: Yes Type of Diet Ordered:: low salt and diabetic Do you have support at home?: Yes People in Home: spouse Name of Support/Comfort Primary Source: Vernona Rieger  Medications Reviewed Today: Medications Reviewed Today     Reviewed by Earlie Server, RN (Registered Nurse) on 03/01/23 at 1643  Med List Status: <None>   Medication Order Taking? Sig Documenting Provider Last Dose Status Informant  amLODipine (NORVASC) 5 MG tablet [id number] No Take 5 mg by mouth daily.  Patient not taking: Reported on 03/01/2023   [provider] Not Taking Active Spouse/Significant Other           Med Note (ROSE, Ralene Cork Mar 01, 2023  4:42 PM) On hold  aspirin EC 81 MG tablet [id number]  Take 81 mg by mouth daily. [provider]  Active Spouse/Significant Other  carvedilol (COREG) 12.5 MG tablet [id number] No Take 1 tablet (12.5 mg total) by mouth 2 (two) times daily with a meal.  Patient not taking: Reported on 03/01/2023   Almon Hercules, MD Not Taking Active  Spouse/Significant Other           Med Note (ROSE, Ralene Cork Mar 01, 2023  4:42 PM) On hold  cefadroxil (DURICEF) 500 MG capsule [id number]  Take 2 capsules (1,000 mg total) by mouth 2 (two) times daily for 7 days. Lanae Boast, MD  Active   colchicine 0.6 MG tablet [id number]  TAKE 2 TABLETS AT ONSET, THEN 1 TABLET 2 HOURS LATER- A MAXIMUM OF 3 TABLETS IN 24 HOURS  Patient taking differently: Take 0.6-1.2 mg by mouth See admin instructions. TAKE 1.2 mg (2 TABLETS) by mouth AT ONSET OF GOUT FLARE, THEN 0.6 mg (1 TABLET) 2 HOURS LATER AS NEEDED- A MAXIMUM OF 1.8 mg (3 TABLETS IN 24 HOURS)   Nida, Denman George, MD  Active Spouse/Significant Other  hydrALAZINE (APRESOLINE) 25 MG tablet [id number]  Take 25 mg by mouth 3 (three) times daily. [provider]  Active Spouse/Significant Other  levothyroxine (SYNTHROID) 200 MCG tablet [id number]  Take 200 mcg by mouth daily before breakfast. [provider]  Active Spouse/Significant Other  magnesium oxide (MAG-OX) 400 (240 Mg) MG tablet [id number]  Take 400 mg by mouth at bedtime. [provider]  Active Spouse/Significant Other  methocarbamol (ROBAXIN) 500 MG tablet [id number]  Take 1 tablet (500 mg total) by mouth every 6 (six) hours as needed for muscle spasms. Cassandria Anger, PA-C  Active Spouse/Significant Other  mupirocin ointment (BACTROBAN) 2 % [id number]  Apply 1 Application topically 2 (two) times daily as needed (as directed for irritation- affected areas). [provider]  Active Spouse/Significant Other  nystatin (MYCOSTATIN/NYSTOP) powder [id number]  Apply 1 Application topically 2 (  two) times daily as needed (as directed for irritation- affected areas). [provider]  Active Spouse/Significant Other  nystatin cream (MYCOSTATIN) [id number]  Apply 1 Application topically 2 (two) times daily as needed (as directed for irritation- affected areas). [provider]  Active Spouse/Significant Other   omega-3 acid ethyl esters (LOVAZA) 1 g capsule [id number]  TAKE 2 CAPSULES BY MOUTH TWICE A DAY  Patient taking differently: Take 2 g by mouth See admin instructions. Take 2 grams by mouth in the morning and afternoon   Rakes, Doralee Albino, FNP  Active Spouse/Significant Other  oxyCODONE (OXY IR/ROXICODONE) 5 MG immediate release tablet [id number] No Take 5 mg by mouth every 4 (four) hours as needed for severe pain.  Patient not taking: Reported on 03/01/2023   [provider] Not Taking Active   pravastatin (PRAVACHOL) 40 MG tablet [id number]  TAKE 1 TABLET BY MOUTH EVERY DAY  Patient not taking: Reported on 02/24/2023   Daryll Drown, NP  Active Spouse/Significant Other  tamsulosin (FLOMAX) 0.4 MG CAPS capsule [id number]  Take 1 capsule (0.4 mg total) by mouth daily. Almon Hercules, MD  Active Spouse/Significant Other  TURMERIC CURCUMIN PO [id number]  Take 1,000 mg by mouth 2 (two) times daily. [provider]  Active Spouse/Significant Other  TYLENOL 500 MG tablet [id number]  Take 1,000 mg by mouth every 6 (six) hours as needed for mild pain or headache. [provider]  Active Spouse/Significant Other  Vitamin D, Ergocalciferol, (DRISDOL) 1.25 MG (50000 UNIT) CAPS capsule [id number]  Take 50,000 Units by mouth every Sunday. [provider]  Active Spouse/Significant Other            Home Care and Equipment/Supplies: Were Home Health Services Ordered?: No Any new equipment or medical supplies ordered?: No  Functional Questionnaire: Do you need assistance with bathing/showering or dressing?: No Do you need assistance with meal preparation?: No Do you need assistance with eating?: No Do you have difficulty maintaining continence: No Do you need assistance with getting out of bed/getting out of a chair/moving?: No Do you have difficulty managing or taking your medications?: No  Follow up appointments reviewed: PCP Follow-up appointment confirmed?: Yes Date of  PCP follow-up appointment?: 03/02/23 Follow-up Provider: [facility] Follow-up appointment confirmed?: Yes Date of Specialist follow-up appointment?: 03/22/23 Follow-Up Specialty Provider:: unknown Do you need transportation to your follow-up appointment?: No Do you understand care options if your condition(s) worsen?: Yes-patient verbalized understanding  SDOH Interventions Today    Flowsheet Row Most Recent Value  SDOH Interventions   Food Insecurity Interventions Intervention Not Indicated  Transportation Interventions Intervention Not Indicated      Interventions Today    Flowsheet Row Most Recent Value  Chronic Disease   Chronic disease during today's visit Diabetes, Hypertension (HTN)  General Interventions   General Interventions Discussed/Reviewed General Interventions Reviewed  Nutrition Interventions   Nutrition Discussed/Reviewed Nutrition Discussed  Pharmacy Interventions   Pharmacy Dicussed/Reviewed Medications and their functions        Rowe Pavy, Charity fundraiser, BSN, CEN [facility] [phone]

## 2023-03-02 ENCOUNTER — Ambulatory Visit (INDEPENDENT_AMBULATORY_CARE_PROVIDER_SITE_OTHER): Payer: BC Managed Care – PPO | Admitting: Family

## 2023-03-02 ENCOUNTER — Encounter: Payer: Self-pay | Admitting: Family

## 2023-03-02 VITALS — BP 149/109 | HR 76 | Temp 97.9°F | Ht 70.0 in | Wt 280.4 lb

## 2023-03-02 DIAGNOSIS — I10 Essential (primary) hypertension: Secondary | ICD-10-CM

## 2023-03-02 DIAGNOSIS — E785 Hyperlipidemia, unspecified: Secondary | ICD-10-CM

## 2023-03-02 DIAGNOSIS — Z09 Encounter for follow-up examination after completed treatment for conditions other than malignant neoplasm: Secondary | ICD-10-CM

## 2023-03-02 DIAGNOSIS — E1169 Type 2 diabetes mellitus with other specified complication: Secondary | ICD-10-CM

## 2023-03-02 DIAGNOSIS — N201 Calculus of ureter: Secondary | ICD-10-CM | POA: Diagnosis not present

## 2023-03-02 DIAGNOSIS — E1165 Type 2 diabetes mellitus with hyperglycemia: Secondary | ICD-10-CM

## 2023-03-02 DIAGNOSIS — N179 Acute kidney failure, unspecified: Secondary | ICD-10-CM

## 2023-03-02 LAB — BAYER DCA HB A1C WAIVED: HB A1C (BAYER DCA - WAIVED): 7.4 % — ABNORMAL HIGH (ref 4.8–5.6)

## 2023-03-02 NOTE — Patient Instructions (Signed)
Hypertension, Adult High blood pressure (hypertension) is when the force of blood pumping through the arteries is too strong. The arteries are the blood vessels that carry blood from the heart throughout the body. Hypertension forces the heart to work harder to pump blood and may cause arteries to become narrow or stiff. Untreated or uncontrolled hypertension can lead to a heart attack, heart failure, a stroke, kidney disease, and other problems. A blood pressure reading consists of a higher number over a lower number. Ideally, your blood pressure should be below 120/80. The first ("top") number is called the systolic pressure. It is a measure of the pressure in your arteries as your heart beats. The second ("bottom") number is called the diastolic pressure. It is a measure of the pressure in your arteries as the heart relaxes. What are the causes? The exact cause of this condition is not known. There are some conditions that result in high blood pressure. What increases the risk? Certain factors may make you more likely to develop high blood pressure. Some of these risk factors are under your control, including: Smoking. Not getting enough exercise or physical activity. Being overweight. Having too much fat, sugar, calories, or salt (sodium) in your diet. Drinking too much alcohol. Other risk factors include: Having a personal history of heart disease, diabetes, high cholesterol, or kidney disease. Stress. Having a family history of high blood pressure and high cholesterol. Having obstructive sleep apnea. Age. The risk increases with age. What are the signs or symptoms? High blood pressure may not cause symptoms. Very high blood pressure (hypertensive crisis) may cause: Headache. Fast or irregular heartbeats (palpitations). Shortness of breath. Nosebleed. Nausea and vomiting. Vision changes. Severe chest pain, dizziness, and seizures. How is this diagnosed? This condition is diagnosed by  measuring your blood pressure while you are seated, with your arm resting on a flat surface, your legs uncrossed, and your feet flat on the floor. The cuff of the blood pressure monitor will be placed directly against the skin of your upper arm at the level of your heart. Blood pressure should be measured at least twice using the same arm. Certain conditions can cause a difference in blood pressure between your right and left arms. If you have a high blood pressure reading during one visit or you have normal blood pressure with other risk factors, you may be asked to: Return on a different day to have your blood pressure checked again. Monitor your blood pressure at home for 1 week or longer. If you are diagnosed with hypertension, you may have other blood or imaging tests to help your health care provider understand your overall risk for other conditions. How is this treated? This condition is treated by making healthy lifestyle changes, such as eating healthy foods, exercising more, and reducing your alcohol intake. You may be referred for counseling on a healthy diet and physical activity. Your health care provider may prescribe medicine if lifestyle changes are not enough to get your blood pressure under control and if: Your systolic blood pressure is above 130. Your diastolic blood pressure is above 80. Your personal target blood pressure may vary depending on your medical conditions, your age, and other factors. Follow these instructions at home: Eating and drinking  Eat a diet that is high in fiber and potassium, and low in sodium, added sugar, and fat. An example of this eating plan is called the DASH diet. DASH stands for Dietary Approaches to Stop Hypertension. To eat this way: Eat   plenty of fresh fruits and vegetables. Try to fill one half of your plate at each meal with fruits and vegetables. Eat whole grains, such as whole-wheat pasta, brown rice, or whole-grain bread. Fill about one  fourth of your plate with whole grains. Eat or drink low-fat dairy products, such as skim milk or low-fat yogurt. Avoid fatty cuts of meat, processed or cured meats, and poultry with skin. Fill about one fourth of your plate with lean proteins, such as fish, chicken without skin, beans, eggs, or tofu. Avoid pre-made and processed foods. These tend to be higher in sodium, added sugar, and fat. Reduce your daily sodium intake. Many people with hypertension should eat less than 1,500 mg of sodium a day. Do not drink alcohol if: Your health care provider tells you not to drink. You are pregnant, may be pregnant, or are planning to become pregnant. If you drink alcohol: Limit how much you have to: 0-1 drink a day for women. 0-2 drinks a day for men. Know how much alcohol is in your drink. In the [place] one drink equals one 12 oz bottle of beer (355 mL), one 5 oz glass of wine (148 mL), or one 1 oz glass of hard liquor (44 mL). Lifestyle  Work with your health care provider to maintain a healthy body weight or to lose weight. Ask what an ideal weight is for you. Get at least 30 minutes of exercise that causes your heart to beat faster (aerobic exercise) most days of the week. Activities may include walking, swimming, or biking. Include exercise to strengthen your muscles (resistance exercise), such as Pilates or lifting weights, as part of your weekly exercise routine. Try to do these types of exercises for 30 minutes at least 3 days a week. Do not use any products that contain nicotine or tobacco. These products include cigarettes, chewing tobacco, and vaping devices, such as e-cigarettes. If you need help quitting, ask your health care provider. Monitor your blood pressure at home as told by your health care provider. Keep all follow-up visits. This is important. Medicines Take over-the-counter and prescription medicines only as told by your health care provider. Follow directions carefully. Blood  pressure medicines must be taken as prescribed. Do not skip doses of blood pressure medicine. Doing this puts you at risk for problems and can make the medicine less effective. Ask your health care provider about side effects or reactions to medicines that you should watch for. Contact a health care provider if you: Think you are having a reaction to a medicine you are taking. Have headaches that keep coming back (recurring). Feel dizzy. Have swelling in your ankles. Have trouble with your vision. Get help right away if you: Develop a severe headache or confusion. Have unusual weakness or numbness. Feel faint. Have severe pain in your chest or abdomen. Vomit repeatedly. Have trouble breathing. These symptoms may be an emergency. Get help right away. Call 911. Do not wait to see if the symptoms will go away. Do not drive yourself to the hospital. Summary Hypertension is when the force of blood pumping through your arteries is too strong. If this condition is not controlled, it may put you at risk for serious complications. Your personal target blood pressure may vary depending on your medical conditions, your age, and other factors. For most people, a normal blood pressure is less than 120/80. Hypertension is treated with lifestyle changes, medicines, or a combination of both. Lifestyle changes include losing weight, eating a healthy,   low-sodium diet, exercising more, and limiting alcohol. This information is not intended to replace advice given to you by your health care provider. Make sure you discuss any questions you have with your health care provider. Document Revised: 04/15/2021 Document Reviewed: 04/15/2021 Elsevier Patient Education  2024 Elsevier Inc.  

## 2023-03-02 NOTE — Progress Notes (Signed)
Subjective:    Patient ID: Tyler [name], male    DOB: [birth year], 47 y.o.   MRN: [id number]  Chief Complaint  Patient presents with   Hospitalization Follow-up   PT presents to the office today for hospital follow up. He went to the ED on 02/17/30 for right sided flank pain and hematuria. CT renal stone study showed punctate stone in the extreme distal right ureter just proximal to the UVJ and mild collecting system dilation proximally with perinephric stranding. Urology consulted and recommended conservative management as long as AKI improves. He was told to stop his Entresto to allow renal recovery. His Coreg was increased. He was discharged.   His BP was 210/135 and went back to the ED on 0.02/24/23 and admitted for hypertension urgency. His CT renal study showed persistent right-sided renal collecting system mild dilatation. 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 However there is a new Cholestin system dilatation of the left kidney and ureter with some perinephric stranding. The previous 4 mm stone in the left renal pelvis is no longer identified. Fatty liver infiltration.   He had cystoscopy with right retrograde and right stent placement and left stone removed. Reports he is having mild right flank pain at evening of a 8 out 10, but lasts seconds and resolves.   He was told to continue to hold Entresto, metformin, and pravastatin until kidney function is stable.  His BP medications currently are Norvasc 5 mg BID, coreg 12.5 mg BID, hydralazine 25 mg TID, and flomax.   His creatinine was 2.88 in hospital compared to his baseline of 1. At discharge his creatinine was 1.64.  His glucose has been elevated while holding metformin. He is followed by Endocrinologists.   Has a Urologists follow up on 0930/24. He has Cardiologists follow up.    Hypertension This is a chronic problem. The current episode started more than 1 year ago. The problem has been waxing and waning since onset. The problem is uncontrolled. Associated symptoms include peripheral edema. Pertinent negatives include no blurred vision, malaise/fatigue or shortness of breath. Risk factors for coronary artery disease include dyslipidemia, diabetes mellitus, obesity, male gender and smoking/tobacco exposure. The current treatment provides mild improvement.  Diabetes He presents for his follow-up diabetic visit. He has type 2 diabetes mellitus. Pertinent negatives for diabetes include no blurred vision and no foot paresthesias. Risk factors for coronary artery disease include dyslipidemia, diabetes mellitus, hypertension, sedentary lifestyle and post-menopausal. He is following a generally unhealthy diet. (Not checking his glucose at home)      Review of Systems  Constitutional:  Negative for malaise/fatigue.  Eyes:  Negative for blurred vision.  Respiratory:  Negative for shortness of breath.   All other systems reviewed and are negative.  Family History  Problem Relation Age of Onset   Hypertension Father    Hyperlipidemia Father    Social History   Socioeconomic History   Marital status: Single    Spouse name: Not on file   Number of children: Not on file   Years of education: Not on file   Highest education level: Not on file  Occupational History   Not on file  Tobacco Use   Smoking status: Every Day    Current packs/day: 0.50    Average packs/day: 0.5 packs/day for 10.0 years (5.0 ttl pk-yrs)    Types: Cigarettes  Smokeless tobacco: Former  Building services engineer status: Never Used  Substance and Sexual Activity   Alcohol use: Yes    Comment: occasional   Drug use: No   Sexual activity: Yes    Birth control/protection: None  Other Topics Concern   Not on file  Social History Narrative   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 Strain: Low Risk  (02/23/2023)   Received from [facility]   Overall Financial Resource Strain (CARDIA)    Difficulty of Paying Living Expenses: Not hard at all  Food Insecurity: No Food Insecurity (03/01/2023)   Hunger Vital Sign    Worried About Running Out of Food in the Last Year: Never true    Ran Out of Food in the Last Year: Never true  Transportation Needs: No Transportation Needs (03/01/2023)   PRAPARE - Administrator, Civil Service (Medical): No    Lack of Transportation (Non-Medical): No  Recent Concern: Transportation Needs - Unmet Transportation Needs (02/23/2023)   Received from [facility] - Transportation    Lack of Transportation (Medical): Yes    Lack of Transportation (Non-Medical): Yes  Physical Activity: Not on file  Stress: Not on file  Social Connections: Unknown (10/24/2021)   [facility]   Social Network    Social Network: Not on file        Objective:   Physical Exam Vitals reviewed.  Constitutional:      General: He is not in acute distress.    Appearance: He is well-developed. He is obese.  HENT:     Head: Normocephalic.     Right Ear: Tympanic membrane normal.     Left Ear: Tympanic membrane normal.  Eyes:     General:        Right eye: No discharge.        Left eye: No discharge.     Pupils: Pupils are equal, round, and reactive to light.  Neck:     Thyroid: No thyromegaly.  Cardiovascular:     Rate and Rhythm: Normal rate and regular rhythm.     Heart sounds: Normal heart sounds. No murmur heard. Pulmonary:     Effort: Pulmonary effort is normal. No respiratory distress.     Breath sounds: Normal breath sounds. No wheezing.  Abdominal:     General: Bowel sounds are normal. There is no distension.     Palpations: Abdomen is soft.     Tenderness: There is no abdominal tenderness.     Comments: No flank pain on exam   Musculoskeletal:        General: No  tenderness. Normal range of motion.     Cervical back: Normal range of motion and neck supple.  Skin:    General: Skin is warm and dry.     Findings: No erythema or rash.  Neurological:     Mental Status: He is alert and oriented to person, place, and time.     Cranial Nerves: No cranial nerve deficit.     Deep Tendon Reflexes: Reflexes are normal and symmetric.  Psychiatric:        Behavior: Behavior normal.        Thought Content: Thought content normal.        Judgment: Judgment normal.       BP (!) 149/109   Pulse 76   Temp 97.9 F (36.6 C) (Temporal)   Ht 5'  10" (1.778 m)   Wt 280 lb 6.4 oz (127.2 kg)   SpO2 97%   BMI 40.23 kg/m      Assessment & Plan:  Tyler [name] comes in today with chief complaint of Hospitalization Follow-up   Diagnosis and orders addressed:  1. Ureteral stone - CBC with Differential/Platelet - CMP14+EGFR  2. Type 2 diabetes mellitus with hyperglycemia, without long-term current use of insulin (HCC) - CBC with Differential/Platelet - CMP14+EGFR - Bayer DCA Hb A1c Waived  3. Morbid obesity (HCC) - CBC with Differential/Platelet - CMP14+EGFR  4. Essential hypertension - CBC with Differential/Platelet - CMP14+EGFR  5. AKI (acute kidney injury) (HCC) - CBC with Differential/Platelet - CMP14+EGFR  6. Hospital discharge follow-up - CBC with Differential/Platelet - CMP14+EGFR  7. Hyperlipidemia associated with type 2 diabetes mellitus (HCC) - CBC with Differential/Platelet - CMP14+EGFR - Lipid panel   Labs pending If creatinine is at baseline pt can resume Entresto, metformin, and pravastatin. Keep Urologists, Cardiologists, and Endocrinologists follow up.  Force fluids Goal BP <140/90 Health Maintenance reviewed Diet and exercise encouraged Approx 40 mins spent with patient, chart review, and education.   Follow up plan: Needs to follow up with PCP in 2-4 weeks    Jannifer Rodney, FNP

## 2023-03-03 ENCOUNTER — Telehealth: Payer: Self-pay | Admitting: Family Medicine

## 2023-03-03 ENCOUNTER — Telehealth: Payer: Self-pay

## 2023-03-03 ENCOUNTER — Other Ambulatory Visit: Payer: Self-pay | Admitting: Family Medicine

## 2023-03-03 DIAGNOSIS — E291 Testicular hypofunction: Secondary | ICD-10-CM | POA: Diagnosis not present

## 2023-03-03 DIAGNOSIS — N201 Calculus of ureter: Secondary | ICD-10-CM | POA: Diagnosis not present

## 2023-03-03 DIAGNOSIS — E1165 Type 2 diabetes mellitus with hyperglycemia: Secondary | ICD-10-CM

## 2023-03-03 LAB — CBC WITH DIFFERENTIAL/PLATELET
Basophils Absolute: 0.1 10*3/uL (ref 0.0–0.2)
Basos: 1 %
EOS (ABSOLUTE): 0.3 10*3/uL (ref 0.0–0.4)
Eos: 3 %
Hematocrit: 38.6 % (ref 37.5–51.0)
Hemoglobin: 12.4 g/dL — ABNORMAL LOW (ref 13.0–17.7)
Immature Grans (Abs): 0.2 10*3/uL — ABNORMAL HIGH (ref 0.0–0.1)
Immature Granulocytes: 2 %
Lymphocytes Absolute: 3.3 10*3/uL — ABNORMAL HIGH (ref 0.7–3.1)
Lymphs: 34 %
MCH: 29.8 pg (ref 26.6–33.0)
MCHC: 32.1 g/dL (ref 31.5–35.7)
MCV: 93 fL (ref 79–97)
Monocytes Absolute: 0.6 10*3/uL (ref 0.1–0.9)
Monocytes: 6 %
Neutrophils Absolute: 5.3 10*3/uL (ref 1.4–7.0)
Neutrophils: 54 %
Platelets: 305 10*3/uL (ref 150–450)
RBC: 4.16 x10E6/uL (ref 4.14–5.80)
RDW: 13.1 % (ref 11.6–15.4)
WBC: 9.7 10*3/uL (ref 3.4–10.8)

## 2023-03-03 LAB — CMP14+EGFR
ALT: 34 IU/L (ref 0–44)
AST: 25 IU/L (ref 0–40)
Albumin: 4.1 g/dL (ref 4.1–5.1)
Alkaline Phosphatase: 90 IU/L (ref 44–121)
BUN/Creatinine Ratio: 15 (ref 9–20)
BUN: 16 mg/dL (ref 6–24)
Bilirubin Total: 0.4 mg/dL (ref 0.0–1.2)
CO2: 24 mmol/L (ref 20–29)
Calcium: 10 mg/dL (ref 8.7–10.2)
Chloride: 104 mmol/L (ref 96–106)
Creatinine, Ser: 1.05 mg/dL (ref 0.76–1.27)
Globulin, Total: 3 g/dL (ref 1.5–4.5)
Glucose: 149 mg/dL — ABNORMAL HIGH (ref 70–99)
Potassium: 5.3 mmol/L — ABNORMAL HIGH (ref 3.5–5.2)
Sodium: 141 mmol/L (ref 134–144)
Total Protein: 7.1 g/dL (ref 6.0–8.5)
eGFR: 90 mL/min/{1.73_m2} (ref >59)

## 2023-03-03 LAB — LIPID PANEL
Chol/HDL Ratio: 6.1 ratio — ABNORMAL HIGH (ref 0.0–5.0)
Cholesterol, Total: 178 mg/dL (ref 100–199)
HDL: 29 mg/dL — ABNORMAL LOW (ref >39)
LDL Chol Calc (NIH): 119 mg/dL — ABNORMAL HIGH (ref 0–99)
Triglycerides: 167 mg/dL — ABNORMAL HIGH (ref 0–149)
VLDL Cholesterol Cal: 30 mg/dL (ref 5–40)

## 2023-03-03 MED ORDER — LANCET DEVICE MISC
1.0000 | Freq: Three times a day (TID) | 0 refills | Status: AC
Start: 2023-03-03 — End: 2023-04-02

## 2023-03-03 MED ORDER — LANCETS MISC. MISC
1.0000 | Freq: Three times a day (TID) | 0 refills | Status: AC
Start: 2023-03-03 — End: 2023-04-02

## 2023-03-03 MED ORDER — BLOOD GLUCOSE TEST VI STRP
1.0000 | ORAL_STRIP | Freq: Three times a day (TID) | 0 refills | Status: DC
Start: 2023-03-03 — End: 2023-03-29

## 2023-03-03 MED ORDER — BLOOD GLUCOSE MONITORING SUPPL DEVI
1.0000 | Freq: Three times a day (TID) | 0 refills | Status: AC
Start: 2023-03-03 — End: ?

## 2023-03-03 NOTE — Telephone Encounter (Signed)
Sent to pharmacy 

## 2023-03-03 NOTE — Telephone Encounter (Signed)
Pts wife called from pharmacy stating that they are still waiting on Tyler [name] to send blood glucose monitor with lancets and test strips to [facility] in [place].

## 2023-03-03 NOTE — Telephone Encounter (Signed)
Tried to call pt but did not receive an answer and was unable to leave a message. 

## 2023-03-04 ENCOUNTER — Telehealth: Payer: Self-pay

## 2023-03-04 DIAGNOSIS — E890 Postprocedural hypothyroidism: Secondary | ICD-10-CM

## 2023-03-04 DIAGNOSIS — Z125 Encounter for screening for malignant neoplasm of prostate: Secondary | ICD-10-CM | POA: Diagnosis not present

## 2023-03-04 DIAGNOSIS — E291 Testicular hypofunction: Secondary | ICD-10-CM | POA: Diagnosis not present

## 2023-03-04 NOTE — Telephone Encounter (Signed)
Pt.notified

## 2023-03-04 NOTE — Telephone Encounter (Signed)
Pt called stating he recently was discharged from the hospital where he was being treated for hypertensive urgency and bilateral ureteral obstruction. States they have advised him to hold is Entresto and Metformin. He continues taking Coreg, Hydralazine and Amlodipine 10mg . Pt's HgbA1c while in the hospital was 7.4. Pt wanted to make sure you were aware.

## 2023-03-04 NOTE — Anesthesia Postprocedure Evaluation (Signed)
Anesthesia Post Note  Patient: Tyler [name]  Procedure(s) Performed: CYSTOSCOPY WITHright  RETROGRADE PYELOGRAM/right URETERAL STENT PLACEMENT and left stone removal (Bilateral: Pelvis)     Patient location during evaluation: PACU Anesthesia Type: General Level of consciousness: awake and alert Pain management: pain level controlled Vital Signs Assessment: post-procedure vital signs reviewed and stable Respiratory status: spontaneous breathing, nonlabored ventilation, respiratory function stable and patient connected to nasal cannula oxygen Cardiovascular status: blood pressure returned to baseline and stable Postop Assessment: no apparent nausea or vomiting Anesthetic complications: no   No notable events documented.  Last Vitals:  Vitals:   02/26/23 0927 02/26/23 1253  BP: (!) 154/105 (!) 148/99  Pulse: 86 83  Resp: 16 18  Temp: 36.7 C 36.7 C  SpO2: 100% 100%    Last Pain:  Vitals:   02/26/23 1253  TempSrc: Oral  PainSc: 0-No pain                 Estephan Gallardo

## 2023-03-04 NOTE — Telephone Encounter (Signed)
Pt made aware

## 2023-03-06 LAB — CALCULI, WITH PHOTOGRAPH (CLINICAL LAB)
Uric Acid Calculi: 100 %
Weight Calculi: 8 mg

## 2023-03-10 ENCOUNTER — Other Ambulatory Visit: Payer: Self-pay | Admitting: Urology

## 2023-03-18 NOTE — Patient Instructions (Addendum)
DUE TO COVID-19 ONLY TWO VISITORS  (aged 45 and older)  ARE ALLOWED TO COME WITH YOU AND STAY IN THE WAITING ROOM ONLY DURING PRE OP AND PROCEDURE.   **NO VISITORS ARE ALLOWED IN THE SHORT STAY AREA OR RECOVERY ROOM!!**  IF YOU WILL BE ADMITTED INTO THE HOSPITAL YOU ARE ALLOWED ONLY FOUR SUPPORT PEOPLE DURING VISITATION HOURS ONLY (7 AM -8PM)   The support person(s) must pass our screening, gel in and out, and wear a mask at all times, including in the patient's room. Patients must also wear a mask when staff or their support person are in the room. Visitors GUEST BADGE MUST BE WORN VISIBLY  One adult visitor may remain with you overnight and MUST be in the room by 8 P.M.     Your procedure is scheduled on: 03/30/23   Report to [facility] Main Entrance    Report to admitting at : 6:00 AM   Call this number if you have problems the morning of surgery [phone]   Do not eat food or drink: After Midnight.  FOLLOW ANY ADDITIONAL PRE OP INSTRUCTIONS YOU RECEIVED FROM YOUR SURGEON'S OFFICE!!!   Oral Hygiene is also important to reduce your risk of infection.                                    Remember - BRUSH YOUR TEETH THE MORNING OF SURGERY WITH YOUR REGULAR TOOTHPASTE  DENTURES WILL BE REMOVED PRIOR TO SURGERY PLEASE DO NOT APPLY "Poly grip" OR ADHESIVES!!!   Do NOT smoke after Midnight   Take these medicines the morning of surgery with A SIP OF WATER: hydralazine,tamsulosin,levothyroxine.Tylenol,colchicine as needed.  DO NOT TAKE ANY ORAL DIABETIC MEDICATIONS DAY OF YOUR SURGERY                  You may not have any metal on your body including hair pins, jewelry, and body piercing             Do not wear lotions, powders, perfumes/cologne, or deodorant              Men may shave face and neck.   Do not bring valuables to the hospital. [facility] IS NOT             RESPONSIBLE   FOR VALUABLES.   Contacts, glasses, or bridgework may not be worn into  surgery.   Bring small overnight bag day of surgery.   DO NOT BRING YOUR HOME MEDICATIONS TO THE HOSPITAL. PHARMACY WILL DISPENSE MEDICATIONS LISTED ON YOUR MEDICATION LIST TO YOU DURING YOUR ADMISSION IN THE HOSPITAL!    Patients discharged on the day of surgery will not be allowed to drive home.  Someone NEEDS to stay with you for the first 24 hours after anesthesia.   Special Instructions: Bring a copy of your healthcare power of attorney and living will documents         the day of surgery if you haven't scanned them before.              Please read over the following fact sheets you were given: IF YOU HAVE QUESTIONS ABOUT YOUR PRE-OP INSTRUCTIONS PLEASE CALL [phone]    [facility] - Preparing for Surgery Before surgery, you can play an important role.  Because skin is not sterile, your skin needs to be as free of germs as possible.  You  can reduce the number of germs on your skin by washing with CHG (chlorahexidine gluconate) soap before surgery.  CHG is an antiseptic cleaner which kills germs and bonds with the skin to continue killing germs even after washing. Please DO NOT use if you have an allergy to CHG or antibacterial soaps.  If your skin becomes reddened/irritated stop using the CHG and inform your nurse when you arrive at Short Stay. Do not shave (including legs and underarms) for at least 48 hours prior to the first CHG shower.  You may shave your face/neck. Please follow these instructions carefully:  1.  Shower with CHG Soap the night before surgery and the  morning of Surgery.  2.  If you choose to wash your hair, wash your hair first as usual with your  normal  shampoo.  3.  After you shampoo, rinse your hair and body thoroughly to remove the  shampoo.                           4.  Use CHG as you would any other liquid soap.  You can apply chg directly  to the skin and wash                       Gently with a scrungie or clean washcloth.  5.  Apply the CHG Soap to your  body ONLY FROM THE NECK DOWN.   Do not use on face/ open                           Wound or open sores. Avoid contact with eyes, ears mouth and genitals (private parts).                       Wash face,  Genitals (private parts) with your normal soap.             6.  Wash thoroughly, paying special attention to the area where your surgery  will be performed.  7.  Thoroughly rinse your body with warm water from the neck down.  8.  DO NOT shower/wash with your normal soap after using and rinsing off  the CHG Soap.                9.  Pat yourself dry with a clean towel.            10.  Wear clean pajamas.            11.  Place clean sheets on your bed the night of your first shower and do not  sleep with pets. Day of Surgery : Do not apply any lotions/deodorants the morning of surgery.  Please wear clean clothes to the hospital/surgery center.  FAILURE TO FOLLOW THESE INSTRUCTIONS MAY RESULT IN THE CANCELLATION OF YOUR SURGERY PATIENT SIGNATURE_________________________________  NURSE SIGNATURE__________________________________  ________________________________________________________________________

## 2023-03-19 ENCOUNTER — Encounter (HOSPITAL_COMMUNITY)
Admission: RE | Admit: 2023-03-19 | Discharge: 2023-03-19 | Disposition: A | Discharge status: Home / Self Care | Payer: BC Managed Care – PPO | Source: Ambulatory Visit | Attending: Urology | Admitting: Urology

## 2023-03-19 ENCOUNTER — Encounter (HOSPITAL_COMMUNITY): Payer: Self-pay

## 2023-03-19 ENCOUNTER — Other Ambulatory Visit: Payer: Self-pay

## 2023-03-19 VITALS — BP 125/95 | HR 77 | Temp 98.0°F | Ht 70.0 in | Wt 279.0 lb

## 2023-03-19 DIAGNOSIS — I509 Heart failure, unspecified: Secondary | ICD-10-CM | POA: Diagnosis not present

## 2023-03-19 DIAGNOSIS — Z01812 Encounter for preprocedural laboratory examination: Secondary | ICD-10-CM | POA: Insufficient documentation

## 2023-03-19 DIAGNOSIS — I110 Hypertensive heart disease with heart failure: Secondary | ICD-10-CM | POA: Diagnosis not present

## 2023-03-19 DIAGNOSIS — Z96643 Presence of artificial hip joint, bilateral: Secondary | ICD-10-CM | POA: Diagnosis not present

## 2023-03-19 DIAGNOSIS — K760 Fatty (change of) liver, not elsewhere classified: Secondary | ICD-10-CM | POA: Diagnosis not present

## 2023-03-19 DIAGNOSIS — I429 Cardiomyopathy, unspecified: Secondary | ICD-10-CM | POA: Insufficient documentation

## 2023-03-19 DIAGNOSIS — E890 Postprocedural hypothyroidism: Secondary | ICD-10-CM | POA: Insufficient documentation

## 2023-03-19 DIAGNOSIS — E1165 Type 2 diabetes mellitus with hyperglycemia: Secondary | ICD-10-CM | POA: Diagnosis not present

## 2023-03-19 DIAGNOSIS — Z85850 Personal history of malignant neoplasm of thyroid: Secondary | ICD-10-CM | POA: Diagnosis not present

## 2023-03-19 DIAGNOSIS — I2510 Atherosclerotic heart disease of native coronary artery without angina pectoris: Secondary | ICD-10-CM | POA: Diagnosis not present

## 2023-03-19 LAB — GLUCOSE, CAPILLARY: Glucose-Capillary: 196 mg/dL — ABNORMAL HIGH (ref 70–99)

## 2023-03-19 NOTE — Progress Notes (Addendum)
For Short Stay: COVID SWAB appointment date:  Bowel Prep reminder:   For Anesthesia: PCP - Sonny Masters, FNP  Cardiologist -  Badal, Alger Simons, MD  : CEW: LOV: 02/23/23  Chest x-ray - 02/03/23 EKG - 02/26/23 Stress Test -  ECHO - 02/25/23 Cardiac Cath -  Pacemaker/ICD device last checked: Pacemaker orders received: Device Rep notified:  Spinal Cord Stimulator: N/A  Sleep Study - Yes CPAP - NO  Fasting Blood Sugar - N/A Checks Blood Sugar __0___ times a day Date and result of last Hgb A1c- 7.2: 02/18/23  Last dose of GLP1 agonist- N/A GLP1 instructions:   Last dose of SGLT-2 inhibitors- N/A SGLT-2 instructions:   Blood Thinner Instructions: N/A Aspirin Instructions: Last Dose:  Activity level: Can go up a flight of stairs and activities of daily living without stopping and without chest pain and/or shortness of breath   Able to exercise without chest pain and/or shortness of breath  Anesthesia review: Hx: CHF,CAD,Cardiomyopathy,HTN,OSA,Smoker.  Patient denies shortness of breath, fever, cough and chest pain at PAT appointment   Patient verbalized understanding of instructions that were given to them at the PAT appointment. Patient was also instructed that they will need to review over the PAT instructions again at home before surgery.

## 2023-03-22 ENCOUNTER — Encounter (HOSPITAL_COMMUNITY): Payer: Self-pay

## 2023-03-22 NOTE — Anesthesia Preprocedure Evaluation (Addendum)
Anesthesia Evaluation  Patient identified by MRN, date of birth, ID band Patient awake    Reviewed: Allergy & Precautions, H&P , NPO status , Patient's Chart, lab work & pertinent test results  Airway Mallampati: III  TM Distance: >3 FB Neck ROM: Full    Dental no notable dental hx. (+) Teeth Intact, Dental Advisory Given   Pulmonary sleep apnea , Current Smoker and Patient abstained from smoking.   Pulmonary exam normal breath sounds clear to auscultation       Cardiovascular hypertension, Pt. on medications and Pt. on home beta blockers + CAD and +CHF   Rhythm:Regular Rate:Normal  ECHO: 1. Left ventricular ejection fraction, by estimation, is 55 to 60%. The  left ventricle has normal function. The left ventricle has no regional  wall motion abnormalities. There is moderate left ventricular hypertrophy.  Left ventricular diastolic  parameters are consistent with Grade I diastolic dysfunction (impaired  relaxation). Elevated left atrial pressure.   2. Right ventricular systolic function is normal. The right ventricular  size is normal.   3. The mitral valve is normal in structure. No evidence of mitral valve  regurgitation. No evidence of mitral stenosis.   4. The aortic valve is tricuspid. Aortic valve regurgitation is not  visualized. No aortic stenosis is present.   5. Aortic dilatation noted. There is borderline dilatation of the aortic  root, measuring 39 mm. There is mild dilatation of the ascending aorta,  measuring 43 mm. There is mild dilatation of the aortic arch, measuring 39  mm.   6. The inferior vena cava is normal in size with greater than 50%  respiratory variability, suggesting right atrial pressure of 3 mmHg.     Neuro/Psych negative neurological ROS  negative psych ROS   GI/Hepatic negative GI ROS, Neg liver ROS,,,  Endo/Other  diabetes, Oral Hypoglycemic AgentsHypothyroidism  Morbid obesity   Renal/GU Renal disease  negative genitourinary   Musculoskeletal  (+) Arthritis , Osteoarthritis,    Abdominal   Peds  Hematology negative hematology ROS (+)   Anesthesia Other Findings RIGHT URETERAL CALCULUS  Reproductive/Obstetrics negative OB ROS                             Anesthesia Physical Anesthesia Plan  ASA: 3  Anesthesia Plan: General   Post-op Pain Management: Tylenol PO (pre-op)*   Induction: Intravenous  PONV Risk Score and Plan: 2 and Ondansetron, Dexamethasone and Midazolam  Airway Management Planned: LMA  Additional Equipment:   Intra-op Plan:   Post-operative Plan: Extubation in OR  Informed Consent: I have reviewed the patients History and Physical, chart, labs and discussed the procedure including the risks, benefits and alternatives for the proposed anesthesia with the patient or authorized representative who has indicated his/her understanding and acceptance.     Dental advisory given  Plan Discussed with: CRNA  Anesthesia Plan Comments: (See PAT note from 9/27 by Sherlie Ban PA-C )        Anesthesia Quick Evaluation

## 2023-03-22 NOTE — Progress Notes (Signed)
DISCUSSION: Tyler [name] is a 47 yo male who presents to PAT prior to CYSTOSCOPY, RIGHT RETROGRADE PYELOGRAM, RIGHT URETEROSCOPY, HOLMIUM LASER LITHOTRIPSY, AND RIGHT URETERAL STENT EXCHANGE on 03/30/23 with Dr. Arita Miss. 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  Prior complication from anesthesia includes intra op awareness. No complications noted during hip surgery in March.  Had ED visit for CP/SOB and coughing on 02/03/23. CTA Chest done which was negative for PE or PNA. Did show TAA of 4.4cm. Acute viral illness suspected.  Admitted from 8/29-8/31 due to right kidney stone with AKI. Kidney stone was small so thought to be able to pass. Sherryll Burger was held and AKI was improving so discharged with outpatient f/u.  He followed up with his Cardiologist on 9/3. Per Dr. Fransisco Beau: "His blood pressure is uncontrolled. I will add amlodipine 5 mg daily and hydralazine 25 mg 3 times daily on top of Coreg 12.5 mg twice daily. Continue blood pressure monitoring at home. He will continue to hold Entresto because of recent AKI. Urolithiasis is conservatively managed. Once kidney function is back to normal, we will plan to resume Entresto. I reviewed recent echocardiogram from 04/03/2022 which showed ejection fraction of almost 50%. He is not in heart failure clinically. He does not need diuretics for now. Patient is on aspirin/statin. By CT coronary angiogram on 01/10/2021, he was found to have mild to moderate disease. Does not endorse anginal symptoms. Because of abnormal EKG, will plan for a stress test once blood pressure better controlled. Will follow-up in 2 weeks. By recent CT chest on 02/03/2023, he was found to have ascending aortic diameter 4.4 cm. Will recommend annual follow-up. Continue heart rate and blood pressure control."  Readmitted from 9/4-9/6 due to hypertension due to holding meds. BP  (180-200 systolic). AKI also worsening again. Echo was done on 9/5 and showed normal EF 55-60%. Went to the OR for cystoscopy and R stent placement on 02/25/23. No apparent surgical or anesthesia complications noted. Scheduled for definitive management of stone on 10/8  Patient has followed up with PCP on 9/10. Per PCP: "If creatinine is at baseline pt can resume Entresto, metformin, and pravastatin. Keep Urologists, Cardiologists, and Endocrinologists follow up. Force fluids. Goal BP <140/90"  Discussed with Dr. Bradley Ferris due to recommendation to have a stress test due to abnormal EKG by Cardiology on 9/3. Due to normal echo, currently better controlled BP, and urgent case ok to proceed.  VS: BP (!) 125/95   Pulse 77   Temp 36.7 C (Oral)   Ht 5\' 10"  (1.778 m)   Wt 126.6 kg   SpO2 98%   BMI 40.03 kg/m   PROVIDERS: Sonny Masters, FNP Cardiologist - Wille Glaser, MD   LABS: Labs reviewed: Acceptable for surgery. Recent AKI resolved. K mildly elevated (5.3) (all labs ordered are listed, but only abnormal results are displayed)  Labs Reviewed  GLUCOSE, CAPILLARY - Abnormal; Notable for the following components:      Result Value   Glucose-Capillary 196 (*)    All other components within normal limits     IMAGES:  CTA Chest 02/03/23:  IMPRESSION: 1. Negative for acute pulmonary embolus or aortic dissection. 2. Aortic atherosclerosis. Ascending aortic diameter up to 4.4 cm. Recommend annual imaging followup by CTA or MRA. This recommendation follows 2010 ACCF/AHA/AATS/ACR/ASA/SCA/SCAI/SIR/STS/SVM Guidelines for the Diagnosis and Management of Patients with Thoracic Aortic Disease. Circulation.  2010; 121: Y865-H846. Aortic aneurysm NOS (ICD10-I71.9)   Aortic Atherosclerosis (ICD10-I70.0).   Aortic aneurysm NOS (ICD10-I71.9).  CT Renal 02/24/23:  IMPRESSION: Persistent right-sided renal collecting system mild dilatation. The distal right ureteral stone is felt to be  present but the bladder and distal ureters are poorly seen with the extensive streak artifact from the bilateral hip arthroplasties.   However there is new collecting system dilatation of the left kidney and ureter with some perinephric stranding. The previous 4 mm stone in the left renal pelvis is now no longer identified. Please correlate for the stone being in the distal ureter.   In the setting of potential distal bilateral ureteral obstruction, please correlate with patient's renal function and consider urologic consultation   Slight wall thickening of the visualized portions of the urinary bladder with stranding.   Fatty liver infiltration.     EKG 02/24/23:  Sinus rhythm slightly widerer QRS compared to prior, poss LBBB   CV:  Echo 02/25/23:  IMPRESSIONS     1. Left ventricular ejection fraction, by estimation, is 55 to 60%. The  left ventricle has normal function. The left ventricle has no regional  wall motion abnormalities. There is moderate left ventricular hypertrophy.  Left ventricular diastolic  parameters are consistent with Grade I diastolic dysfunction (impaired  relaxation). Elevated left atrial pressure.   2. Right ventricular systolic function is normal. The right ventricular  size is normal.   3. The mitral valve is normal in structure. No evidence of mitral valve  regurgitation. No evidence of mitral stenosis.   4. The aortic valve is tricuspid. Aortic valve regurgitation is not  visualized. No aortic stenosis is present.   5. Aortic dilatation noted. There is borderline dilatation of the aortic  root, measuring 39 mm. There is mild dilatation of the ascending aorta,  measuring 43 mm. There is mild dilatation of the aortic arch, measuring 39  mm.   6. The inferior vena cava is normal in size with greater than 50%  respiratory variability, suggesting right atrial pressure of 3 mmHg.   Comparison(s): No prior Echocardiogram.   Past Medical  History:  Diagnosis Date   Arthritis    Cardiomyopathy (HCC)    CHF (congestive heart failure) (HCC)    Complication of anesthesia    Woke up during surgery   Coronary artery disease    Diabetes (HCC)    Gout    High cholesterol    Hypertension    Low testosterone 04/17/2021   Malignant neoplasm of thyroid gland (HCC) 04/17/2016   Pneumonia    Postsurgical hypothyroidism    thyroid cancer   Sleep apnea    Vitamin D insufficiency     Past Surgical History:  Procedure Laterality Date   CYSTOSCOPY W/ URETERAL STENT PLACEMENT Bilateral 02/25/2023   Procedure: CYSTOSCOPY WITHright  RETROGRADE PYELOGRAM/right URETERAL STENT PLACEMENT and left stone removal;  Surgeon: Alfredo Martinez, MD;  Location: [facility];  Service: Urology;  Laterality: Bilateral;   EYE SURGERY Left    cataract removal   FRACTURE SURGERY Left 06/23/1987   ankle   THYROIDECTOMY N/A 03/03/2013   Procedure: TOTAL THYROIDECTOMY;  Surgeon: Dalia Heading, MD;  Location: [facility];  Service: General;  Laterality: N/A;   TONSILLECTOMY     TOTAL HIP ARTHROPLASTY Right 07/28/2021   TOTAL HIP ARTHROPLASTY Left 09/08/2022   Procedure: TOTAL HIP ARTHROPLASTY ANTERIOR APPROACH;  Surgeon: Durene Romans, MD;  Location: [facility];  Service: Orthopedics;  Laterality: Left;    MEDICATIONS:  amLODipine (NORVASC) 5 MG tablet   aspirin EC 81 MG tablet   Blood Glucose Monitoring Suppl DEVI   carvedilol (COREG) 12.5 MG tablet   celecoxib (CELEBREX) 200 MG capsule   colchicine 0.6 MG tablet   Glucose Blood (BLOOD GLUCOSE TEST STRIPS) STRP   hydrALAZINE (APRESOLINE) 25 MG tablet   Lancet Device MISC   Lancets Misc. MISC   levothyroxine (SYNTHROID) 200 MCG tablet   magnesium oxide (MAG-OX) 400 (240 Mg) MG tablet   meloxicam (MOBIC) 15 MG tablet   metFORMIN (GLUCOPHAGE) 1000 MG tablet   methocarbamol (ROBAXIN) 500 MG tablet   mupirocin ointment (BACTROBAN) 2 %   nystatin (MYCOSTATIN/NYSTOP) powder   nystatin cream (MYCOSTATIN)    omega-3 acid ethyl esters (LOVAZA) 1 g capsule   oxyCODONE (OXY IR/ROXICODONE) 5 MG immediate release tablet   pravastatin (PRAVACHOL) 40 MG tablet   sacubitril-valsartan (ENTRESTO) 97-103 MG   tamsulosin (FLOMAX) 0.4 MG CAPS capsule   TURMERIC CURCUMIN PO   TYLENOL 500 MG tablet   Vitamin D, Ergocalciferol, (DRISDOL) 1.25 MG (50000 UNIT) CAPS capsule   No current facility-administered medications for this encounter.   Marcille Blanco [facility] Short Stay/Anesthesiology [facility] Phone [phone] 03/22/2023 11:27 AM

## 2023-03-24 DIAGNOSIS — M65332 Trigger finger, left middle finger: Secondary | ICD-10-CM | POA: Diagnosis not present

## 2023-03-24 DIAGNOSIS — M65341 Trigger finger, right ring finger: Secondary | ICD-10-CM | POA: Diagnosis not present

## 2023-03-24 DIAGNOSIS — M65342 Trigger finger, left ring finger: Secondary | ICD-10-CM | POA: Diagnosis not present

## 2023-03-24 DIAGNOSIS — M109 Gout, unspecified: Secondary | ICD-10-CM | POA: Diagnosis not present

## 2023-03-26 ENCOUNTER — Other Ambulatory Visit: Payer: Self-pay | Admitting: Family Medicine

## 2023-03-26 DIAGNOSIS — E781 Pure hyperglyceridemia: Secondary | ICD-10-CM

## 2023-03-26 DIAGNOSIS — E119 Type 2 diabetes mellitus without complications: Secondary | ICD-10-CM

## 2023-03-26 DIAGNOSIS — E1165 Type 2 diabetes mellitus with hyperglycemia: Secondary | ICD-10-CM

## 2023-03-29 NOTE — H&P (Signed)
/HPI: cc: urolithiasis, low testosterone   06/19/21: 47 year old man with a history of hypothyroidism and diabetes referred for low testosterone. Patient reported fatigue to his PCP and lab work check showed a total testosterone of 77. He does follow with endocrine for hypothyroidism. Patient reports he does not have significant symptoms. He saw a commercial on TV stating that testosterone can decline after age 18. He and his wife are unsure if they are going to have more children. No family history of prostate cancer. No bothersome lower urinary tract symptoms.   07/07/21: 47 year old man with the above history returns for follow-up after testosterone lab work was repeated. Total testosterone was 207.    03/03/23: 47 year old man with a history of low testosterone and recent hospital admission for bilateral ureteral calculi. He underwent basket stone extraction of left ureteral calculus and right ureteral stent placement. He is tolerating the stent well. When he was seen over a year ago he initially decided not to proceed with testosterone replacement but is interested in rechecking his labs.     ALLERGIES: No Known Allergies    MEDICATIONS: Aspirin  Metformin Hcl 1,000 mg tablet  Metoprolol Succinate  Colchicine 0.6 mg capsule  Entresto  Isosorbide Mononitrate  Levothyroxine  Meloxicam  Pravastatin Sodium 20 mg tablet  Turmeric     GU PSH: No GU PSH      PSH Notes: Cataract removal lens put in left eye   NON-GU PSH: Leg/ankle Surgery Procedure Thyroidectomy     GU PMH: Primary hypogonadism - 2023, - 06/19/2021      PMH Notes: Thyroid cancer  diabetes   NON-GU PMH: Arthritis Gout Heart disease, unspecified Heart failure, unspecified Hypercholesterolemia Hypertension Hypothyroidism Sleep Apnea    FAMILY HISTORY: No Family History    SOCIAL HISTORY: Marital Status: Single Current Smoking Status: Patient smokes.   Tobacco Use Assessment Completed: Used Tobacco in  last 30 days? Drinks 1 drink per day.  Drinks 3 caffeinated drinks per day.    REVIEW OF SYSTEMS:    GU Review Male:   Patient denies frequent urination, hard to postpone urination, burning/ pain with urination, get up at night to urinate, leakage of urine, stream starts and stops, trouble starting your stream, have to strain to urinate , erection problems, and penile pain.  Gastrointestinal (Upper):   Patient denies nausea, vomiting, and indigestion/ heartburn.  Gastrointestinal (Lower):   Patient denies diarrhea and constipation.  Constitutional:   Patient denies fever, night sweats, weight loss, and fatigue.  Skin:   Patient denies skin rash/ lesion and itching.  Eyes:   Patient denies blurred vision and double vision.  Ears/ Nose/ Throat:   Patient denies sore throat and sinus problems.  Hematologic/Lymphatic:   Patient denies swollen glands and easy bruising.  Cardiovascular:   Patient denies leg swelling and chest pains.  Respiratory:   Patient denies cough and shortness of breath.  Endocrine:   Patient denies excessive thirst.  Musculoskeletal:   Patient reports back pain. Patient denies joint pain.  Neurological:   Patient denies headaches and dizziness.  Psychologic:   Patient denies depression and anxiety.   VITAL SIGNS:      03/03/2023 02:22 PM  BP 141/98 mmHg  Pulse 76 /min  Temperature 98.2 F / 36.7 C   MULTI-SYSTEM PHYSICAL EXAMINATION:    Constitutional: Well-nourished. No physical deformities. Normally developed. Good grooming.  Neck: Neck symmetrical, not swollen. Normal tracheal position.  Respiratory: No labored breathing, no use of accessory muscles.   Skin:  No paleness, no jaundice, no cyanosis. No lesion, no ulcer, no rash.  Neurologic / Psychiatric: Oriented to time, oriented to place, oriented to person. No depression, no anxiety, no agitation.  Eyes: Normal conjunctivae. Normal eyelids.  Ears, Nose, Mouth, and Throat: Left ear no scars, no lesions, no  masses. Right ear no scars, no lesions, no masses. Nose no scars, no lesions, no masses. Normal hearing. Normal lips.  Musculoskeletal: Normal gait and station of head and neck.     Complexity of Data:  Records Review:   Previous Hospital Records, Previous Patient Records, POC Tool  Urine Test Review:   Urinalysis  X-Ray Review: C.T. Abdomen/Pelvis: Reviewed Films. Reviewed Report. Discussed With Patient. IMPRESSION:  Persistent right-sided renal collecting system mild dilatation. The  distal right ureteral stone is felt to be present but the bladder  and distal ureters are poorly seen with the extensive streak  artifact from the bilateral hip arthroplasties.   However there is new collecting system dilatation of the left kidney  and ureter with some perinephric stranding. The previous 4 mm stone  in the left renal pelvis is now no longer identified. Please  correlate for the stone being in the distal ureter.   In the setting of potential distal bilateral ureteral obstruction,  please correlate with patient's renal function and consider urologic  consultation   Slight wall thickening of the visualized portions of the urinary  bladder with stranding.   Fatty liver infiltration.    Electronically Signed  By: Karen Kays M.D.  On: 02/24/2023 15:05      06/19/21  PSA  Total PSA 0.57 ng/mL    PROCEDURES:          Urinalysis w/Scope Dipstick Dipstick Cont'd Micro  Color: Yellow Bilirubin: Neg mg/dL WBC/hpf: 0 - 5/hpf  Appearance: Cloudy Ketones: Neg mg/dL RBC/hpf: 40 - 81/XBJ  Specific Gravity: 1.025 Blood: 2+ ery/uL Bacteria: Rare (0-9/hpf)  pH: 5.5 Protein: 1+ mg/dL Cystals: NS (Not Seen)  Glucose: Neg mg/dL Urobilinogen: 0.2 mg/dL Casts: NS (Not Seen)    Nitrites: Neg Trichomonas: Not Present    Leukocyte Esterase: Trace leu/uL Mucous: Present      Epithelial Cells: NS (Not Seen)      Yeast: NS (Not Seen)      Sperm: Not Present    ASSESSMENT:      ICD-10 Details   1 GU:   Primary hypogonadism - E29.1 Chronic, Stable  2   Ureteral calculus - N20.1 Acute, Stable   PLAN:           Schedule Labs: 1 Week - Testosterone Total,Free,Bio    1 Week - Hemoglobin and Hematocrit    1 Week - PSA          Document Letter(s):  Created for Patient: Clinical Summary         Notes:   1. Low testosterone: We will repeat lab work first thing in the morning including testosterone, PSA and hemoglobin/Retacrit.   2. Urolithiasis: Patient to be scheduled for right ureteroscopy with laser lithotripsy and stent exchange. We discussed risks and benefits of the procedure including but not limited to pain, bleeding, infection, stent discomfort, ureteral stricture, damage to surrounding structures, inability to remove stone, need for additional procedures.

## 2023-03-30 ENCOUNTER — Ambulatory Visit (HOSPITAL_COMMUNITY)
Admission: RE | Admit: 2023-03-30 | Discharge: 2023-03-30 | Disposition: A | Discharge status: Home / Self Care | Payer: BC Managed Care – PPO | Source: Ambulatory Visit | Attending: Urology | Admitting: Urology

## 2023-03-30 ENCOUNTER — Ambulatory Visit (HOSPITAL_COMMUNITY): Payer: BC Managed Care – PPO

## 2023-03-30 ENCOUNTER — Ambulatory Visit (HOSPITAL_COMMUNITY): Payer: BC Managed Care – PPO | Admitting: Anesthesiology

## 2023-03-30 ENCOUNTER — Encounter (HOSPITAL_COMMUNITY): Payer: Self-pay | Admitting: Urology

## 2023-03-30 ENCOUNTER — Encounter (HOSPITAL_COMMUNITY)
Admission: RE | Disposition: A | Discharge status: Home / Self Care | Payer: Self-pay | Source: Ambulatory Visit | Attending: Urology

## 2023-03-30 ENCOUNTER — Other Ambulatory Visit: Payer: Self-pay

## 2023-03-30 DIAGNOSIS — F17200 Nicotine dependence, unspecified, uncomplicated: Secondary | ICD-10-CM | POA: Insufficient documentation

## 2023-03-30 DIAGNOSIS — E039 Hypothyroidism, unspecified: Secondary | ICD-10-CM | POA: Diagnosis not present

## 2023-03-30 DIAGNOSIS — I110 Hypertensive heart disease with heart failure: Secondary | ICD-10-CM | POA: Diagnosis not present

## 2023-03-30 DIAGNOSIS — G4730 Sleep apnea, unspecified: Secondary | ICD-10-CM | POA: Insufficient documentation

## 2023-03-30 DIAGNOSIS — N201 Calculus of ureter: Secondary | ICD-10-CM | POA: Diagnosis not present

## 2023-03-30 DIAGNOSIS — I2510 Atherosclerotic heart disease of native coronary artery without angina pectoris: Secondary | ICD-10-CM | POA: Diagnosis not present

## 2023-03-30 DIAGNOSIS — E291 Testicular hypofunction: Secondary | ICD-10-CM | POA: Insufficient documentation

## 2023-03-30 DIAGNOSIS — I509 Heart failure, unspecified: Secondary | ICD-10-CM | POA: Diagnosis not present

## 2023-03-30 DIAGNOSIS — Z6841 Body Mass Index (BMI) 40.0 and over, adult: Secondary | ICD-10-CM | POA: Insufficient documentation

## 2023-03-30 DIAGNOSIS — E119 Type 2 diabetes mellitus without complications: Secondary | ICD-10-CM | POA: Insufficient documentation

## 2023-03-30 HISTORY — PX: CYSTOSCOPY/URETEROSCOPY/HOLMIUM LASER/STENT PLACEMENT: SHX6546

## 2023-03-30 LAB — GLUCOSE, CAPILLARY
Glucose-Capillary: 189 mg/dL — ABNORMAL HIGH (ref 70–99)
Glucose-Capillary: 192 mg/dL — ABNORMAL HIGH (ref 70–99)

## 2023-03-30 SURGERY — CYSTOSCOPY/URETEROSCOPY/HOLMIUM LASER/STENT PLACEMENT
Anesthesia: General | Site: Ureter | Laterality: Right

## 2023-03-30 MED ORDER — FENTANYL CITRATE (PF) 100 MCG/2ML IJ SOLN
INTRAMUSCULAR | Status: DC | PRN
  Administered 2023-03-30: 50 ug via INTRAVENOUS

## 2023-03-30 MED ORDER — LACTATED RINGERS IV SOLN: INTRAVENOUS | Status: DC | PRN

## 2023-03-30 MED ORDER — SODIUM CHLORIDE 0.9% FLUSH: 3.0000 mL | Freq: Two times a day (BID) | INTRAVENOUS | Status: DC

## 2023-03-30 MED ORDER — ACETAMINOPHEN 500 MG PO TABS
1000.0000 mg | ORAL_TABLET | Freq: Once | ORAL | Status: DC
  Filled 2023-03-30: qty 2

## 2023-03-30 MED ORDER — EPHEDRINE SULFATE-NACL 50-0.9 MG/10ML-% IV SOSY
PREFILLED_SYRINGE | INTRAVENOUS | Status: DC | PRN
  Administered 2023-03-30: 10 mg via INTRAVENOUS

## 2023-03-30 MED ORDER — IOHEXOL 300 MG/ML  SOLN: INTRAMUSCULAR | Status: DC | PRN

## 2023-03-30 MED ORDER — INSULIN ASPART 100 UNIT/ML IJ SOLN
0.0000 [IU] | INTRAMUSCULAR | Status: DC | PRN
  Administered 2023-03-30: 4 [IU] via SUBCUTANEOUS
  Filled 2023-03-30: qty 1

## 2023-03-30 MED ORDER — ORAL CARE MOUTH RINSE: 15.0000 mL | Freq: Once | OROMUCOSAL | Status: AC

## 2023-03-30 MED ORDER — DEXAMETHASONE SODIUM PHOSPHATE 10 MG/ML IJ SOLN
INTRAMUSCULAR | Status: DC | PRN
  Administered 2023-03-30: 10 mg via INTRAVENOUS

## 2023-03-30 MED ORDER — CEPHALEXIN 500 MG PO CAPS: 500.0000 mg | ORAL_CAPSULE | Freq: Once | ORAL | 0 refills | Status: AC

## 2023-03-30 MED ORDER — LIDOCAINE 2% (20 MG/ML) 5 ML SYRINGE
INTRAMUSCULAR | Status: DC | PRN
  Administered 2023-03-30: 100 mg via INTRAVENOUS

## 2023-03-30 MED ORDER — PROPOFOL 10 MG/ML IV BOLUS
INTRAVENOUS | Status: AC
  Filled 2023-03-30: qty 20

## 2023-03-30 MED ORDER — ONDANSETRON HCL 4 MG/2ML IJ SOLN
INTRAMUSCULAR | Status: DC | PRN
  Administered 2023-03-30: 4 mg via INTRAVENOUS

## 2023-03-30 MED ORDER — LACTATED RINGERS IV SOLN: INTRAVENOUS | Status: DC

## 2023-03-30 MED ORDER — PHENYLEPHRINE 80 MCG/ML (10ML) SYRINGE FOR IV PUSH (FOR BLOOD PRESSURE SUPPORT)
PREFILLED_SYRINGE | INTRAVENOUS | Status: DC | PRN
  Administered 2023-03-30: 240 ug via INTRAVENOUS
  Administered 2023-03-30 (×2): 80 ug via INTRAVENOUS
  Administered 2023-03-30: 160 ug via INTRAVENOUS
  Administered 2023-03-30: 240 ug via INTRAVENOUS

## 2023-03-30 MED ORDER — CHLORHEXIDINE GLUCONATE 0.12 % MT SOLN
15.0000 mL | Freq: Once | OROMUCOSAL | Status: AC
  Administered 2023-03-30: 15 mL via OROMUCOSAL

## 2023-03-30 MED ORDER — FENTANYL CITRATE (PF) 100 MCG/2ML IJ SOLN
INTRAMUSCULAR | Status: AC
  Filled 2023-03-30: qty 2

## 2023-03-30 MED ORDER — FENTANYL CITRATE PF 50 MCG/ML IJ SOSY: 25.0000 ug | PREFILLED_SYRINGE | INTRAMUSCULAR | Status: DC | PRN

## 2023-03-30 MED ORDER — MIDAZOLAM HCL 5 MG/5ML IJ SOLN
INTRAMUSCULAR | Status: DC | PRN
  Administered 2023-03-30: 2 mg via INTRAVENOUS

## 2023-03-30 MED ORDER — PROPOFOL 10 MG/ML IV BOLUS
INTRAVENOUS | Status: DC | PRN
  Administered 2023-03-30: 200 mg via INTRAVENOUS

## 2023-03-30 MED ORDER — SODIUM CHLORIDE 0.9 % IR SOLN
Status: DC | PRN
  Administered 2023-03-30: 3000 mL

## 2023-03-30 MED ORDER — MIDAZOLAM HCL 2 MG/2ML IJ SOLN
INTRAMUSCULAR | Status: AC
  Filled 2023-03-30: qty 2

## 2023-03-30 MED ORDER — OXYCODONE-ACETAMINOPHEN 5-325 MG PO TABS
1.0000 | ORAL_TABLET | ORAL | 0 refills | Status: DC | PRN
Start: 2023-03-30 — End: 2023-06-04

## 2023-03-30 MED ORDER — CEFAZOLIN IN SODIUM CHLORIDE 3-0.9 GM/100ML-% IV SOLN
3.0000 g | INTRAVENOUS | Status: AC
  Administered 2023-03-30: 3 g via INTRAVENOUS
  Filled 2023-03-30: qty 100

## 2023-03-30 SURGICAL SUPPLY — 25 items
BAG URO CATCHER STRL LF (MISCELLANEOUS) ×1 IMPLANT
BASKET ZERO TIP NITINOL 2.4FR (BASKET) IMPLANT
BSKT STON RTRVL ZERO TP 2.4FR (BASKET) ×1
CATH URETL OPEN 5X70 (CATHETERS) ×1 IMPLANT
CLOTH BEACON ORANGE TIMEOUT ST (SAFETY) ×1 IMPLANT
DRSG TEGADERM 2-3/8X2-3/4 SM (GAUZE/BANDAGES/DRESSINGS) IMPLANT
EXTRACTOR STONE 1.7FRX115CM (UROLOGICAL SUPPLIES) IMPLANT
FIBER LASER MOSES 200 DFL (Laser) IMPLANT
FIBER LASER MOSES 365 DFL (Laser) IMPLANT
GLOVE BIO SURGEON STRL SZ 6.5 (GLOVE) ×1 IMPLANT
GOWN STRL REUS W/ TWL LRG LVL3 (GOWN DISPOSABLE) ×1 IMPLANT
GOWN STRL REUS W/TWL LRG LVL3 (GOWN DISPOSABLE) ×1
GUIDEWIRE STR DUAL SENSOR (WIRE) ×1 IMPLANT
KIT TURNOVER KIT A (KITS) IMPLANT
LASER FIB FLEXIVA PULSE ID 365 (Laser) IMPLANT
MANIFOLD NEPTUNE II (INSTRUMENTS) ×1 IMPLANT
PACK CYSTO (CUSTOM PROCEDURE TRAY) ×1 IMPLANT
PAD PREP 24X48 CUFFED NSTRL (MISCELLANEOUS) ×1 IMPLANT
SHEATH NAVIGATOR HD 11/13X28 (SHEATH) IMPLANT
SHEATH NAVIGATOR HD 11/13X36 (SHEATH) IMPLANT
STENT URET 6FRX26 CONTOUR (STENTS) IMPLANT
TRACTIP FLEXIVA PULS ID 200XHI (Laser) IMPLANT
TRACTIP FLEXIVA PULSE ID 200 (Laser) ×1
TUBING CONNECTING 10 (TUBING) ×1 IMPLANT
TUBING UROLOGY SET (TUBING) ×1 IMPLANT

## 2023-03-30 NOTE — Anesthesia Postprocedure Evaluation (Signed)
Anesthesia Post Note  Patient: Tyler [name]  Procedure(s) Performed: CYSTOSCOPY, RIGHT RETROGRADE PYELOGRAM, RIGHT URETEROSCOPY, HOLMIUM LASER LITHOTRIPSY, AND RIGHT URETERAL STENT EXCHANGE (Right: Ureter)     Patient location during evaluation: PACU Anesthesia Type: General Level of consciousness: awake and alert Pain management: pain level controlled Vital Signs Assessment: post-procedure vital signs reviewed and stable Respiratory status: spontaneous breathing, nonlabored ventilation and respiratory function stable Cardiovascular status: blood pressure returned to baseline and stable Postop Assessment: no apparent nausea or vomiting Anesthetic complications: no  No notable events documented.  Last Vitals:  Vitals:   03/30/23 0900 03/30/23 0915  BP: 123/85 (!) 133/97  Pulse: 68 66  Resp: (!) 9 20  Temp:  36.7 C  SpO2: 96% 97%    Last Pain:  Vitals:   03/30/23 0915  TempSrc:   PainSc: 0-No pain                 Elizbeth Posa,W. EDMOND

## 2023-03-30 NOTE — Op Note (Addendum)
Preoperative diagnosis: right ureteral calculus  Postoperative diagnosis: right ureteral calculus  Procedure:  Cystoscopy right ureteroscopy, laser lithotripsy, basket stone extraction right 8F x 26cm ureteral stent exchange  - with tether   Surgeon: Kasandra Knudsen, MD  Anesthesia: General  Complications: None  Intraoperative findings:  Normal urethra Bilateral orthotropic ureteral orifices Bladder mucosa normal without masses   EBL: Minimal  Specimens: right ureteral calculus -given to patient  Disposition of specimens: [facility] for stone analysis  Indication: Tyler [name] is a 47 y.o.   patient with a 4 mm right ureteral calculus and associated  symptoms. After reviewing the management options for treatment, the patient elected to proceed with the above surgical procedure(s). We have discussed the potential benefits and risks of the procedure, side effects of the proposed treatment, the likelihood of the patient achieving the goals of the procedure, and any potential problems that might occur during the procedure or recuperation. Informed consent has been obtained.   Description of procedure:  The patient was taken to the operating room and general anesthesia was induced.  The patient was placed in the dorsal lithotomy position, prepped and draped in the usual sterile fashion, and preoperative antibiotics were administered. A preoperative time-out was performed.   Cystourethroscopy was performed.  The patient's urethra was examined and was normal.  demonstrated bilobar prostatic hypertrophy. / bilobar prostatic hypertrophy with a median lobe. The bladder was then systematically examined in its entirety. There was no evidence for any bladder tumors, stones, or other mucosal pathology.    The existing stent was seen emanating from the right ureteral orifice and With a grasper.  Is brought to the urethral meatus and a 0.38 sensor wire was advanced through  the stent and up to the kidney and fluoroscopic guidance.  The stent was discarded.  It was examined and deemed to be intact.  Semirigid ureteroscopy then took place alongside the wire.  No stone was encountered in the distal and mid ureter.  A second sensor wire was placed through the ureteroscope and advanced to the kidney under direct visualization.  The ureteroscope was removed.  One of the wires were secured as a safety wire.  A ureteral access sheath was placed over the second wire and advanced to the proximal ureter with fluoroscopy.  The inner sheath wire removed.  Flexible ureteroscopy then took place.  There were no large calculi seen.  He had submucosal calcifications and a small approximate 3 mm calculus in the lower pole that was basketed.  The 200 m holmium laser fiber was then used to fragment another small calcification which was then removed with a 0 tip basket.  The ureteral access sheath was then removed in unison with the ureteroscope taking care examine the ureter on the way out.  There is no trauma or injury noted to the ureter.  The wire was then backloaded through the cystoscope and a ureteral stent was advance over the wire using Seldinger technique.  The stent was positioned appropriately under fluoroscopic and cystoscopic guidance.  The wire was then removed with an adequate stent curl noted in the renal pelvis as well as in the bladder.  The bladder was then emptied and the procedure ended.  The patient appeared to tolerate the procedure well and without complications.  The patient was able to be awakened and transferred to the recovery unit in satisfactory condition.   Disposition: The tether of the stent was left on and secured to the ventral aspect of  the patient's penis.  Instructions to remove the stent at home in 3 days were given to the patient.

## 2023-03-30 NOTE — Transfer of Care (Signed)
Immediate Anesthesia Transfer of Care Note  Patient: Tyler [name]  Procedure(s) Performed: CYSTOSCOPY, RIGHT RETROGRADE PYELOGRAM, RIGHT URETEROSCOPY, HOLMIUM LASER LITHOTRIPSY, AND RIGHT URETERAL STENT EXCHANGE (Right: Ureter)  Patient Location: PACU  Anesthesia Type:General  Level of Consciousness: sedated, patient cooperative, and responds to stimulation  Airway & Oxygen Therapy: Patient Spontanous Breathing and Patient connected to face mask oxygen  Post-op Assessment: Report given to RN and Post -op Vital signs reviewed and stable  Post vital signs: Reviewed and stable  Last Vitals:  Vitals Value Taken Time  BP 133/70 03/30/23 0832  Temp    Pulse 73 03/30/23 0834  Resp 14 03/30/23 0834  SpO2 100 % 03/30/23 0834  Vitals shown include unfiled device data.  Last Pain:  Vitals:   03/30/23 0706  TempSrc: Oral         Complications: No notable events documented.

## 2023-03-30 NOTE — Interval H&P Note (Signed)
History and Physical Interval Note:  03/30/2023 7:29 AM  Tyler [name]  has presented today for surgery, with the diagnosis of RIGHT URETERAL CALCULUS.  The various methods of treatment have been discussed with the patient and family. After consideration of risks, benefits and other options for treatment, the patient has consented to  Procedure(s) with comments: CYSTOSCOPY, RIGHT RETROGRADE PYELOGRAM, RIGHT URETEROSCOPY, HOLMIUM LASER LITHOTRIPSY, AND RIGHT URETERAL STENT EXCHANGE (Right) - 60 MINUTES as a surgical intervention.  The patient's history has been reviewed, patient examined, no change in status, stable for surgery.  I have reviewed the patient's chart and labs.  Questions were answered to the patient's satisfaction.     Roosevelt Bisher D Acacia Latorre

## 2023-03-30 NOTE — Anesthesia Procedure Notes (Signed)
Procedure Name: LMA Insertion Date/Time: 03/30/2023 8:01 AM  Performed by: Kizzie Fantasia, CRNAPre-anesthesia Checklist: Patient identified, Emergency Drugs available, Suction available, Patient being monitored and Timeout performed Patient Re-evaluated:Patient Re-evaluated prior to induction Preoxygenation: Pre-oxygenation with 100% oxygen Induction Type: IV induction Ventilation: Mask ventilation without difficulty LMA: LMA inserted LMA Size: 5.0 Number of attempts: 1 Placement Confirmation: positive ETCO2 and breath sounds checked- equal and bilateral Tube secured with: Tape Dental Injury: Teeth and Oropharynx as per pre-operative assessment

## 2023-03-30 NOTE — Discharge Instructions (Addendum)
DISCHARGE INSTRUCTIONS FOR KIDNEY STONE/URETERAL STENT   MEDICATIONS:  1. Resume all your other meds from home  2. AZO over the counter can help with the burning/stinging when you urinate. 3. Oxycodone-acetaminophen (this has tylenol in it) is for moderate/severe pain, otherwise taking up to 1000 mg every 6 hours of plainTylenol will help treat your pain.   4. Take Cephalexin one hour prior to removal of your stent.    ACTIVITY:  1. No strenuous activity x 1week  2. No driving while on narcotic pain medications  3. Drink plenty of water  4. Continue to walk at home - you can still get blood clots when you are at home, so keep active, but don't over do it.  5. May return to work/school tomorrow or when you feel ready   BATHING:  1. You can shower and we recommend daily showers  2. You have a string coming from your urethra: The stent string is attached to your ureteral stent. Do not pull on this.   SIGNS/SYMPTOMS TO CALL:  Please call us if you have a fever greater than 101.5, uncontrolled nausea/vomiting, uncontrolled pain, dizziness, unable to urinate, bloody urine, chest pain, shortness of breath, leg swelling, leg pain, redness around wound, drainage from wound, or any other concerns or questions.   You can reach [place] at [phone].   FOLLOW-UP:  1. You have a string attached to your stent, you may remove it on Friday, October 11. To do this, pull the string until the stent is completely removed. You may feel an odd sensation in your back.

## 2023-03-31 ENCOUNTER — Encounter (HOSPITAL_COMMUNITY): Payer: Self-pay | Admitting: Urology

## 2023-04-02 DIAGNOSIS — N201 Calculus of ureter: Secondary | ICD-10-CM | POA: Diagnosis not present

## 2023-04-09 ENCOUNTER — Ambulatory Visit (INDEPENDENT_AMBULATORY_CARE_PROVIDER_SITE_OTHER): Payer: BC Managed Care – PPO | Admitting: "Endocrinology

## 2023-04-09 ENCOUNTER — Encounter: Payer: Self-pay | Admitting: "Endocrinology

## 2023-04-09 VITALS — BP 134/90 | HR 68 | Ht 70.0 in | Wt 284.0 lb

## 2023-04-09 DIAGNOSIS — E119 Type 2 diabetes mellitus without complications: Secondary | ICD-10-CM | POA: Diagnosis not present

## 2023-04-09 DIAGNOSIS — Z7984 Long term (current) use of oral hypoglycemic drugs: Secondary | ICD-10-CM

## 2023-04-09 DIAGNOSIS — I10 Essential (primary) hypertension: Secondary | ICD-10-CM | POA: Diagnosis not present

## 2023-04-09 DIAGNOSIS — C73 Malignant neoplasm of thyroid gland: Secondary | ICD-10-CM | POA: Diagnosis not present

## 2023-04-09 DIAGNOSIS — E291 Testicular hypofunction: Secondary | ICD-10-CM | POA: Insufficient documentation

## 2023-04-09 DIAGNOSIS — E890 Postprocedural hypothyroidism: Secondary | ICD-10-CM

## 2023-04-09 DIAGNOSIS — E782 Mixed hyperlipidemia: Secondary | ICD-10-CM | POA: Insufficient documentation

## 2023-04-09 DIAGNOSIS — E1069 Type 1 diabetes mellitus with other specified complication: Secondary | ICD-10-CM | POA: Insufficient documentation

## 2023-04-09 DIAGNOSIS — E1169 Type 2 diabetes mellitus with other specified complication: Secondary | ICD-10-CM | POA: Insufficient documentation

## 2023-04-09 DIAGNOSIS — E559 Vitamin D deficiency, unspecified: Secondary | ICD-10-CM

## 2023-04-09 MED ORDER — EMPAGLIFLOZIN 10 MG PO TABS: 10.0000 mg | ORAL_TABLET | Freq: Every day | ORAL | 1 refills | Status: DC

## 2023-04-09 NOTE — Progress Notes (Signed)
04/09/2023     Endocrinology follow-up note   Subjective:    Patient ID: Tyler [name], male    DOB: 28-Dec-1975, PCP Rakes, Doralee Albino, FNP   Past Medical History:  Diagnosis Date   Arthritis    Cardiomyopathy [facility])    CHF (congestive heart failure) (HCC)    Complication of anesthesia    Woke up during surgery   Coronary artery disease    Diabetes (HCC)    Gout    High cholesterol    Hypertension    Low testosterone 04/17/2021   Malignant neoplasm of thyroid gland (HCC) 04/17/2016   Pneumonia    Postsurgical hypothyroidism    thyroid cancer   Sleep apnea    Vitamin D insufficiency    Past Surgical History:  Procedure Laterality Date   CYSTOSCOPY W/ URETERAL STENT PLACEMENT Bilateral 02/25/2023   Procedure: CYSTOSCOPY WITHright  RETROGRADE PYELOGRAM/right URETERAL STENT PLACEMENT and left stone removal;  Surgeon: Alfredo Martinez, MD;  Location: [facility];  Service: Urology;  Laterality: Bilateral;   CYSTOSCOPY/URETEROSCOPY/HOLMIUM LASER/STENT PLACEMENT Right 03/30/2023   Procedure: CYSTOSCOPY, RIGHT RETROGRADE PYELOGRAM, RIGHT URETEROSCOPY, HOLMIUM LASER LITHOTRIPSY, AND RIGHT URETERAL STENT EXCHANGE;  Surgeon: Noel Christmas, MD;  Location: [facility];  Service: Urology;  Laterality: Right;  60 MINUTES   EYE SURGERY Left    cataract removal   FRACTURE SURGERY Left 06/23/1987   ankle   THYROIDECTOMY N/A 03/03/2013   Procedure: TOTAL THYROIDECTOMY;  Surgeon: Dalia Heading, MD;  Location: [facility];  Service: General;  Laterality: N/A;   TONSILLECTOMY     TOTAL HIP ARTHROPLASTY Right 07/28/2021   TOTAL HIP ARTHROPLASTY Left 09/08/2022   Procedure: TOTAL HIP ARTHROPLASTY ANTERIOR APPROACH;  Surgeon: Durene Romans, MD;  Location: [facility];  Service: Orthopedics;  Laterality: Left;   Social History   Socioeconomic History   Marital status: Single    Spouse name: Not on file   Number of children: Not on file   Years of education: Not on file   Highest education level: Not on  file  Occupational History   Not on file  Tobacco Use   Smoking status: Every Day    Current packs/day: 0.50    Average packs/day: 0.5 packs/day for 10.0 years (5.0 ttl pk-yrs)    Types: Cigarettes   Smokeless tobacco: Former  Building services engineer status: Never Used  Substance and Sexual Activity   Alcohol use: Yes    Comment: occasional   Drug use: No   Sexual activity: Yes    Birth control/protection: None  Other Topics Concern   Not on file  Social History Narrative   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 Strain: Low Risk  (02/23/2023)   Received from [facility]   Overall Financial Resource Strain (CARDIA)    Difficulty of Paying Living Expenses: Not hard at all  Food Insecurity: No Food Insecurity (03/01/2023)   Hunger Vital Sign    Worried About Running Out of Food in the Last Year: Never true    Ran Out of Food in the Last Year: Never true  Transportation Needs: No Transportation Needs (03/01/2023)   PRAPARE - Administrator, Civil Service (Medical): No    Lack of Transportation (Non-Medical): No  Recent Concern: Transportation Needs - Unmet Transportation Needs (02/23/2023)   Received from [facility] - Transportation    Lack of Transportation (Medical): Yes    Lack of Transportation (Non-Medical):  Yes  Physical Activity: Not on file  Stress: Not on file  Social Connections: Unknown (10/24/2021)   Received from [facility]   Social Network    Social Network: Not on file   Outpatient Encounter Medications as of 04/09/2023  Medication Sig   empagliflozin (JARDIANCE) 10 MG TABS tablet Take 1 tablet (10 mg total) by mouth daily before breakfast.   Accu-Chek Softclix Lancets lancets Test BS morning, noon and at bedtime Dx E11.65   amLODipine (NORVASC) 5 MG tablet Take 5 mg by mouth daily.   aspirin EC 81 MG tablet Take 81 mg by mouth daily.   Blood Glucose Monitoring Suppl DEVI 1 each by Does not  apply route in the morning, at noon, and at bedtime. May substitute to any manufacturer covered by patient's insurance.   carvedilol (COREG) 12.5 MG tablet Take 1 tablet (12.5 mg total) by mouth 2 (two) times daily with a meal.   celecoxib (CELEBREX) 200 MG capsule Take 200 mg by mouth 2 (two) times daily.   colchicine 0.6 MG tablet TAKE 2 TABLETS AT ONSET, THEN 1 TABLET 2 HOURS LATER- A MAXIMUM OF 3 TABLETS IN 24 HOURS (Patient taking differently: Take 0.6-1.2 mg by mouth See admin instructions. TAKE 1.2 mg (2 TABLETS) by mouth AT ONSET OF GOUT FLARE, THEN 0.6 mg (1 TABLET) 2 HOURS LATER AS NEEDED- A MAXIMUM OF 1.8 mg (3 TABLETS IN 24 HOURS))   glucose blood (ACCU-CHEK GUIDE) test strip Test BS morning, noon and at bedtime Dx E11.65   hydrALAZINE (APRESOLINE) 25 MG tablet Take 25 mg by mouth 3 (three) times daily.   levothyroxine (SYNTHROID) 200 MCG tablet Take 200 mcg by mouth daily before breakfast.   magnesium oxide (MAG-OX) 400 (240 Mg) MG tablet Take 400 mg by mouth at bedtime.   meloxicam (MOBIC) 15 MG tablet TAKE 1 TABLET (15 MG TOTAL) BY MOUTH DAILY.   metFORMIN (GLUCOPHAGE) 1000 MG tablet TAKE 1 TABLET (1,000 MG TOTAL) BY MOUTH TWICE A DAY WITH FOOD   methocarbamol (ROBAXIN) 500 MG tablet Take 1 tablet (500 mg total) by mouth every 6 (six) hours as needed for muscle spasms.   mupirocin ointment (BACTROBAN) 2 % Apply 1 Application topically 2 (two) times daily as needed (as directed for irritation- affected areas).   nystatin (MYCOSTATIN/NYSTOP) powder Apply 1 Application topically 2 (two) times daily as needed (as directed for irritation- affected areas).   nystatin cream (MYCOSTATIN) Apply 1 Application topically 2 (two) times daily as needed (as directed for irritation- affected areas).   omega-3 acid ethyl esters (LOVAZA) 1 g capsule TAKE 2 CAPSULES BY MOUTH TWICE A DAY   oxyCODONE-acetaminophen (PERCOCET) 5-325 MG tablet Take 1 tablet by mouth every 4 (four) hours as needed for severe  pain.   pravastatin (PRAVACHOL) 40 MG tablet TAKE 1 TABLET BY MOUTH EVERY DAY   sacubitril-valsartan (ENTRESTO) 97-103 MG Take 1 tablet by mouth 2 (two) times daily.   tamsulosin (FLOMAX) 0.4 MG CAPS capsule Take 1 capsule (0.4 mg total) by mouth daily.   TURMERIC CURCUMIN PO Take 1,000 mg by mouth 2 (two) times daily.   TYLENOL 500 MG tablet Take 1,000 mg by mouth every 6 (six) hours as needed for mild pain or headache.   Vitamin D, Ergocalciferol, (DRISDOL) 1.25 MG (50000 UNIT) CAPS capsule Take 50,000 Units by mouth every Sunday.   No facility-administered encounter medications on file as of 04/09/2023.   ALLERGIES: Allergies  Allergen Reactions   Isosorbide Other (See  Comments)    Lowers the blood pressure too much   VACCINATION STATUS: Immunization History  Administered Date(s) Administered   Influenza-Unspecified 05/20/2000   Janssen (J&J) SARS-COV-2 Vaccination 04/03/2020   Td 04/08/1999   Tdap 05/05/2020    HPI  47 yr old male with medical history as above . He underwent total thyroidectomy for thyroid cancer on 03/03/2013 by Dr. Lovell Sheehan at [facility] in [place].   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 no lymph nodes were identified.  He received his first Thyrogen stimulated I -131 thyroid remnant ablation on 05/05/13 same, with no evidence of distant metastasis. -Subsequent Thyrogen Stimulated  whole body scan in January 2017  was reported to be unremarkable for tumor recurrence/distant metastases.    -On March 17, 2021, he underwent Thyrogen stimulated whole-body scan which was unremarkable. He presents with repeat thyroid function test consistent with discordant TSH and free T4.   He remains on levothyroxine 200 mcg before breakfast.    - He also has hypertension, type 2 diabetes, currently on metformin.  His point-of-care A1c is higher at 7.4% compared to 7.1%  during his last visit.    His blood pressure is not controlled despite multiple blood pressure medications with duplicate doses. -He is recovering from recent hip surgery.  His recent labs showed hypogonadism, wishes to engage with workup to establish etiology behind this.     Review of Systems  Review of systems  Constitutional: + Minimally fluctuating body weight,  current  Body mass index is 40.75 kg/m. , no fatigue, no subjective hyperthermia, no subjective hypothermia    Objective:    BP (!) 134/90   Pulse 68   Ht 5\' 10"  (1.778 m)   Wt 284 lb (128.8 kg)   BMI 40.75 kg/m   Wt Readings from Last 3 Encounters:  04/09/23 284 lb (128.8 kg)  03/30/23 279 lb (126.6 kg)  03/19/23 279 lb (126.6 kg)      Recent Results (from the past 2160 hour(s))  Basic metabolic panel     Status: Abnormal   Collection Time: 02/03/23  2:59 PM  Result Value Ref Range   Sodium 136 135 - 145 mmol/L   Potassium 3.7 3.5 - 5.1 mmol/L   Chloride 105 98 - 111 mmol/L   CO2 19 (L) 22 - 32 mmol/L   Glucose, Bld 194 (H) 70 - 99 mg/dL    Comment: Glucose reference range applies only to samples taken after fasting for at least 8 hours.   BUN 8 6 - 20 mg/dL   Creatinine, Ser 7.82 0.61 - 1.24 mg/dL   Calcium 8.8 (L) 8.9 - 10.3 mg/dL   GFR, Estimated >95 >62 mL/min    Comment: (NOTE) Calculated using the CKD-EPI Creatinine Equation (2021)    Anion gap 12 5 - 15    Comment: Performed at [facility], [address] [place], [place] [address]  CBC     Status: None   Collection Time: 02/03/23  2:59 PM  Result Value Ref Range   WBC 10.0 4.0 - 10.5 K/uL   RBC 4.68 4.22 - 5.81 MIL/uL   Hemoglobin 13.8 13.0 - 17.0 g/dL   HCT 57.8 46.9 - 62.9 %   MCV 86.5 80.0 - 100.0 fL   MCH 29.5 26.0 - 34.0 pg   MCHC 34.1 30.0 - 36.0 g/dL   RDW 52.8 41.3 - 24.4 %   Platelets  208 150 - 400 K/uL   nRBC 0.0 0.0 - 0.2 %    Comment: Performed at [facility], [address] [place], [place]  [address]  SARS Coronavirus 2 by RT PCR (hospital order, performed in [facility] hospital lab) *cepheid single result test* Anterior Nasal Swab     Status: None   Collection Time: 02/03/23  2:59 PM   Specimen: Anterior Nasal Swab  Result Value Ref Range   SARS Coronavirus 2 by RT PCR NEGATIVE NEGATIVE    Comment: (NOTE) SARS-CoV-2 target nucleic acids are NOT DETECTED.  The SARS-CoV-2 RNA is generally detectable in upper and lower respiratory specimens during the acute phase of infection. The lowest concentration of SARS-CoV-2 viral copies this assay can detect is 250 copies / mL. A negative result does not preclude SARS-CoV-2 infection and should not be used as the sole basis for treatment or other patient management decisions.  A negative result may occur with improper specimen collection / handling, submission of specimen other than nasopharyngeal swab, presence of viral mutation(s) within the areas targeted by this assay, and inadequate number of viral copies (<250 copies / mL). A negative result must be combined with clinical observations, patient history, and epidemiological information.  Fact Sheet for Patients:   RoadLapTop.co.za  Fact Sheet for Healthcare Providers: http://kim-miller.com/  This test is not yet approved or  cleared by the [place] FDA and has been authorized for detection and/or diagnosis of SARS-CoV-2 by FDA under an Emergency Use Authorization (EUA).  This EUA will remain in effect (meaning this test can be used) for the duration of the COVID-19 declaration under Section 564(b)(1) of the Act, 21 U.S.C. section 360bbb-3(b)(1), unless the authorization is terminated or revoked sooner.  Performed at [facility], [address] [place], [place] [address]   Troponin I (High Sensitivity)     Status: None   Collection Time: 02/03/23  3:00 PM  Result Value Ref Range   Troponin I (High Sensitivity)  10 <18 ng/L    Comment: (NOTE) Elevated high sensitivity troponin I (hsTnI) values and significant  changes across serial measurements may suggest ACS but many other  chronic and acute conditions are known to elevate hsTnI results.  Refer to the "Links" section for chest pain algorithms and additional  guidance. Performed at [facility], [address] [place], [place] [address]   Brain natriuretic peptide     Status: None   Collection Time: 02/03/23  3:00 PM  Result Value Ref Range   B Natriuretic Peptide 99.3 0.0 - 100.0 pg/mL    Comment: Performed at [facility], [address] [place], [place] [address]  Troponin I (High Sensitivity)     Status: None   Collection Time: 02/03/23  8:38 PM  Result Value Ref Range   Troponin I (High Sensitivity) 12 <18 ng/L    Comment: (NOTE) Elevated high sensitivity troponin I (hsTnI) values and significant  changes across serial measurements may suggest ACS but many other  chronic and acute conditions are known to elevate hsTnI results.  Refer to the "Links" section for chest pain algorithms and additional  guidance. Performed at [facility], [address] [place], [place] [address]   CBC     Status: Abnormal   Collection Time: 02/18/23  3:25 PM  Result Value Ref Range   WBC 10.8 (H) 4.0 - 10.5 K/uL   RBC 4.55 4.22 -  5.81 MIL/uL   Hemoglobin 13.5 13.0 - 17.0 g/dL   HCT 09.8 11.9 - 14.7 %   MCV 88.8 80.0 - 100.0 fL   MCH 29.7 26.0 - 34.0 pg   MCHC 33.4 30.0 - 36.0 g/dL   RDW 82.9 56.2 - 13.0 %   Platelets 249 150 - 400 K/uL   nRBC 0.0 0.0 - 0.2 %    Comment: Performed at [facility], [address] [place], [place] [address]  Comprehensive metabolic panel     Status: Abnormal   Collection Time: 02/18/23  3:30 PM  Result Value Ref Range   Sodium 137 135 - 145 mmol/L   Potassium 3.6 3.5 - 5.1 mmol/L   Chloride 104 98 - 111 mmol/L   CO2 21 (L) 22 - 32 mmol/L   Glucose, Bld 215 (H) 70  - 99 mg/dL    Comment: Glucose reference range applies only to samples taken after fasting for at least 8 hours.   BUN 17 6 - 20 mg/dL   Creatinine, Ser 4.69 (H) 0.61 - 1.24 mg/dL   Calcium 9.3 8.9 - 62.9 mg/dL   Total Protein 7.3 6.5 - 8.1 g/dL   Albumin 3.9 3.5 - 5.0 g/dL   AST 30 15 - 41 U/L   ALT 39 0 - 44 U/L   Alkaline Phosphatase 67 38 - 126 U/L   Total Bilirubin 0.7 0.3 - 1.2 mg/dL   GFR, Estimated 36 (L) >60 mL/min    Comment: (NOTE) Calculated using the CKD-EPI Creatinine Equation (2021)    Anion gap 12 5 - 15    Comment: Performed at [facility], [address] [place], [place] [address]  CK     Status: None   Collection Time: 02/18/23  3:30 PM  Result Value Ref Range   Total CK 70 49 - 397 U/L    Comment: Performed at [facility], [address] [place], [place] [address]  Urinalysis, Routine w reflex microscopic -Urine, Unspecified Source     Status: Abnormal   Collection Time: 02/18/23  3:53 PM  Result Value Ref Range   Color, Urine BROWN (A) YELLOW    Comment: BIOCHEMICALS MAY BE AFFECTED BY COLOR   APPearance TURBID (A) CLEAR   Specific Gravity, Urine  1.005 - 1.030    TEST NOT REPORTED DUE TO COLOR INTERFERENCE OF URINE PIGMENT   pH  5.0 - 8.0    TEST NOT REPORTED DUE TO COLOR INTERFERENCE OF URINE PIGMENT   Glucose, UA (A) NEGATIVE mg/dL    TEST NOT REPORTED DUE TO COLOR INTERFERENCE OF URINE PIGMENT   Hgb urine dipstick (A) NEGATIVE    TEST NOT REPORTED DUE TO COLOR INTERFERENCE OF URINE PIGMENT   Bilirubin Urine (A) NEGATIVE    TEST NOT REPORTED DUE TO COLOR INTERFERENCE OF URINE PIGMENT   Ketones, ur (A) NEGATIVE mg/dL    TEST NOT REPORTED DUE TO COLOR INTERFERENCE OF URINE PIGMENT   Protein, ur (A) NEGATIVE mg/dL    TEST NOT REPORTED DUE TO COLOR INTERFERENCE OF URINE PIGMENT   Nitrite (A) NEGATIVE    TEST NOT REPORTED DUE TO COLOR INTERFERENCE OF URINE PIGMENT   Leukocytes,Ua (A) NEGATIVE    TEST NOT REPORTED DUE TO COLOR  INTERFERENCE OF URINE PIGMENT    Comment: Performed at [facility], [address] [place], [place] [address]  Urinalysis, Microscopic (reflex)     Status: Abnormal  Collection Time: 02/18/23  3:53 PM  Result Value Ref Range   RBC / HPF >50 0 - 5 RBC/hpf   WBC, UA 6-10 0 - 5 WBC/hpf   Bacteria, UA FEW (A) NONE SEEN   Squamous Epithelial / HPF 6-10 0 - 5 /HPF    Comment: Performed at [facility], [address] [place], [place] [address]  Basic metabolic panel     Status: Abnormal   Collection Time: 02/18/23  5:54 PM  Result Value Ref Range   Sodium 141 135 - 145 mmol/L   Potassium 3.5 3.5 - 5.1 mmol/L   Chloride 106 98 - 111 mmol/L   CO2 24 22 - 32 mmol/L   Glucose, Bld 105 (H) 70 - 99 mg/dL    Comment: Glucose reference range applies only to samples taken after fasting for at least 8 hours.   BUN 17 6 - 20 mg/dL   Creatinine, Ser 2.44 (H) 0.61 - 1.24 mg/dL   Calcium 8.7 (L) 8.9 - 10.3 mg/dL   GFR, Estimated 41 (L) >60 mL/min    Comment: (NOTE) Calculated using the CKD-EPI Creatinine Equation (2021)    Anion gap 11 5 - 15    Comment: Performed at [facility], [address], [place], [place] [address]  Urine Culture     Status: None   Collection Time: 02/18/23  5:54 PM   Specimen: Urine, Clean Catch  Result Value Ref Range   Specimen Description      URINE, CLEAN CATCH Performed at [facility], [address] [place], [place] [address]    Special Requests      NONE Performed at [facility], [address] [place], [place] [address]    Culture      NO GROWTH Performed at [facility], [address] [place], [place] [address]    Report Status 02/19/2023 FINAL   Magnesium     Status: None   Collection Time: 02/18/23  7:00 PM  Result Value Ref Range   Magnesium 1.7 1.7 - 2.4 mg/dL    Comment: Performed at [facility], [address] [place], [place] [address]  Phosphorus     Status:  None   Collection Time: 02/18/23  7:00 PM  Result Value Ref Range   Phosphorus 3.4 2.5 - 4.6 mg/dL    Comment: Performed at [facility], [address] [place], [place] [address]  TSH     Status: None   Collection Time: 02/18/23  7:00 PM  Result Value Ref Range   TSH 4.391 0.350 - 4.500 uIU/mL    Comment: Performed by a 3rd Generation assay with a functional sensitivity of <=0.01 uIU/mL. Performed at [facility], [address], [place], [place] [address]   Sodium, urine, random     Status: None   Collection Time: 02/18/23  7:00 PM  Result Value Ref Range   Sodium, Ur 105 mmol/L    Comment: Performed at [facility], [address], [place], [place] [address]  Creatinine, urine, random     Status: None   Collection Time: 02/18/23  7:00 PM  Result Value Ref Range   Creatinine, Urine 200 mg/dL    Comment: Performed at [facility], [address], [place], [place] [address]  Hepatic function panel     Status: None   Collection Time: 02/18/23  7:30  PM  Result Value Ref Range   Total Protein 6.7 6.5 - 8.1 g/dL   Albumin 3.5 3.5 - 5.0 g/dL   AST 21 15 - 41 U/L   ALT 33 0 - 44 U/L   Alkaline Phosphatase 62 38 - 126 U/L   Total Bilirubin 0.5 0.3 - 1.2 mg/dL   Bilirubin, Direct 0.1 0.0 - 0.2 mg/dL   Indirect Bilirubin 0.4 0.3 - 0.9 mg/dL    Comment: Performed at [facility], [address] [place], [place] [address]  Hemoglobin A1c     Status: Abnormal   Collection Time: 02/18/23  9:56 PM  Result Value Ref Range   Hgb A1c MFr Bld 7.2 (H) 4.8 - 5.6 %    Comment: (NOTE) Pre diabetes:          5.7%-6.4%  Diabetes:              >6.4%  Glycemic control for   <7.0% adults with diabetes    Mean Plasma Glucose 159.94 mg/dL    Comment: Performed at [facility], [address] [place] [address] [place], [place] [address]  Urinalysis, Complete w Microscopic -Urine, Clean Catch     Status: Abnormal   Collection Time: 02/18/23 10:16 PM  Result Value Ref  Range   Color, Urine YELLOW YELLOW   APPearance CLEAR CLEAR   Specific Gravity, Urine 1.013 1.005 - 1.030   pH 5.0 5.0 - 8.0   Glucose, UA NEGATIVE NEGATIVE mg/dL   Hgb urine dipstick LARGE (A) NEGATIVE   Bilirubin Urine NEGATIVE NEGATIVE   Ketones, ur NEGATIVE NEGATIVE mg/dL   Protein, ur NEGATIVE NEGATIVE mg/dL   Nitrite NEGATIVE NEGATIVE   Leukocytes,Ua NEGATIVE NEGATIVE   RBC / HPF 21-50 0 - 5 RBC/hpf   WBC, UA 0-5 0 - 5 WBC/hpf   Bacteria, UA RARE (A) NONE SEEN   Squamous Epithelial / HPF 6-10 0 - 5 /HPF   Mucus PRESENT    Hyaline Casts, UA PRESENT     Comment: Performed at [facility], [address], [place], [place] [address]  Glucose, capillary     Status: Abnormal   Collection Time: 02/18/23 11:51 PM  Result Value Ref Range   Glucose-Capillary 143 (H) 70 - 99 mg/dL    Comment: Glucose reference range applies only to samples taken after fasting for at least 8 hours.  Glucose, capillary     Status: Abnormal   Collection Time: 02/19/23  3:58 AM  Result Value Ref Range   Glucose-Capillary 158 (H) 70 - 99 mg/dL    Comment: Glucose reference range applies only to samples taken after fasting for at least 8 hours.  Magnesium     Status: None   Collection Time: 02/19/23  3:59 AM  Result Value Ref Range   Magnesium 2.0 1.7 - 2.4 mg/dL    Comment: Performed at [facility], [address], [place], [place] [address]  Phosphorus     Status: None   Collection Time: 02/19/23  3:59 AM  Result Value Ref Range   Phosphorus 3.9 2.5 - 4.6 mg/dL    Comment: Performed at [facility], [address] [place], [place] [address]  Comprehensive metabolic panel     Status: Abnormal   Collection Time: 02/19/23  3:59 AM  Result Value Ref Range   Sodium 137 135 - 145 mmol/L   Potassium 3.4 (L) 3.5 - 5.1 mmol/L   Chloride 108 98 - 111 mmol/L  CO2 20 (L) 22 - 32 mmol/L   Glucose, Bld 155 (H) 70 - 99 mg/dL    Comment: Glucose  reference range applies only to samples taken after fasting for at least 8 hours.   BUN 16 6 - 20 mg/dL   Creatinine, Ser 2.95 (H) 0.61 - 1.24 mg/dL   Calcium 8.3 (L) 8.9 - 10.3 mg/dL   Total Protein 6.4 (L) 6.5 - 8.1 g/dL   Albumin 3.3 (L) 3.5 - 5.0 g/dL   AST 19 15 - 41 U/L   ALT 31 0 - 44 U/L   Alkaline Phosphatase 64 38 - 126 U/L   Total Bilirubin 0.6 0.3 - 1.2 mg/dL   GFR, Estimated 46 (L) >60 mL/min    Comment: (NOTE) Calculated using the CKD-EPI Creatinine Equation (2021)    Anion gap 9 5 - 15    Comment: Performed at [facility], [address], [place], [place] [address]  CBC     Status: Abnormal   Collection Time: 02/19/23  3:59 AM  Result Value Ref Range   WBC 11.5 (H) 4.0 - 10.5 K/uL   RBC 4.22 4.22 - 5.81 MIL/uL   Hemoglobin 12.9 (L) 13.0 - 17.0 g/dL   HCT 86.5 (L) 78.4 - 69.6 %   MCV 91.9 80.0 - 100.0 fL   MCH 30.6 26.0 - 34.0 pg   MCHC 33.2 30.0 - 36.0 g/dL   RDW 29.5 28.4 - 13.2 %   Platelets 203 150 - 400 K/uL   nRBC 0.0 0.0 - 0.2 %    Comment: Performed at [facility], [address], [place], [place] [address]  HIV Antibody (routine testing w rflx)     Status: None   Collection Time: 02/19/23  3:59 AM  Result Value Ref Range   HIV Screen 4th Generation wRfx Non Reactive Non Reactive    Comment: Performed at [facility], [address] [place] [address] [place], [place] [address]  Glucose, capillary     Status: Abnormal   Collection Time: 02/19/23  8:07 AM  Result Value Ref Range   Glucose-Capillary 126 (H) 70 - 99 mg/dL    Comment: Glucose reference range applies only to samples taken after fasting for at least 8 hours.  Glucose, capillary     Status: Abnormal   Collection Time: 02/19/23 11:43 AM  Result Value Ref Range   Glucose-Capillary 115 (H) 70 - 99 mg/dL    Comment: Glucose reference range applies only to samples taken after fasting for at least 8 hours.  Glucose, capillary     Status: Abnormal   Collection Time:  02/19/23  4:01 PM  Result Value Ref Range   Glucose-Capillary 137 (H) 70 - 99 mg/dL    Comment: Glucose reference range applies only to samples taken after fasting for at least 8 hours.  Glucose, capillary     Status: Abnormal   Collection Time: 02/19/23  8:21 PM  Result Value Ref Range   Glucose-Capillary 159 (H) 70 - 99 mg/dL    Comment: Glucose reference range applies only to samples taken after fasting for at least 8 hours.  Glucose, capillary     Status: Abnormal   Collection Time: 02/19/23 11:49 PM  Result Value Ref Range   Glucose-Capillary 110 (H) 70 - 99 mg/dL    Comment: Glucose reference range applies only to samples taken after fasting for at least 8 hours.  Renal function panel     Status: Abnormal   Collection  Time: 02/20/23  4:16 AM  Result Value Ref Range   Sodium 138 135 - 145 mmol/L   Potassium 3.9 3.5 - 5.1 mmol/L   Chloride 109 98 - 111 mmol/L   CO2 21 (L) 22 - 32 mmol/L   Glucose, Bld 155 (H) 70 - 99 mg/dL    Comment: Glucose reference range applies only to samples taken after fasting for at least 8 hours.   BUN 17 6 - 20 mg/dL   Creatinine, Ser 9.56 (H) 0.61 - 1.24 mg/dL   Calcium 8.8 (L) 8.9 - 10.3 mg/dL   Phosphorus 3.9 2.5 - 4.6 mg/dL   Albumin 3.1 (L) 3.5 - 5.0 g/dL   GFR, Estimated 52 (L) >60 mL/min    Comment: (NOTE) Calculated using the CKD-EPI Creatinine Equation (2021)    Anion gap 8 5 - 15    Comment: Performed at [facility], [address], [place], [place] [address]  Magnesium     Status: None   Collection Time: 02/20/23  4:16 AM  Result Value Ref Range   Magnesium 1.8 1.7 - 2.4 mg/dL    Comment: Performed at [facility], [address], [place], [place] [address]  CBC     Status: Abnormal   Collection Time: 02/20/23  4:16 AM  Result Value Ref Range   WBC 9.6 4.0 - 10.5 K/uL   RBC 3.92 (L) 4.22 - 5.81 MIL/uL   Hemoglobin 11.8 (L) 13.0 - 17.0 g/dL   HCT 69.6 (L) 29.5 - 28.4 %   MCV 91.6 80.0 -  100.0 fL   MCH 30.1 26.0 - 34.0 pg   MCHC 32.9 30.0 - 36.0 g/dL   RDW 13.2 44.0 - 10.2 %   Platelets 188 150 - 400 K/uL   nRBC 0.0 0.0 - 0.2 %    Comment: Performed at [facility], [address] [place], [place] [address]  CK     Status: None   Collection Time: 02/20/23  4:16 AM  Result Value Ref Range   Total CK 52 49 - 397 U/L    Comment: Performed at [facility], [address] [place], [place] [address]  Glucose, capillary     Status: Abnormal   Collection Time: 02/20/23  4:18 AM  Result Value Ref Range   Glucose-Capillary 149 (H) 70 - 99 mg/dL    Comment: Glucose reference range applies only to samples taken after fasting for at least 8 hours.  Glucose, capillary     Status: Abnormal   Collection Time: 02/20/23  7:39 AM  Result Value Ref Range   Glucose-Capillary 133 (H) 70 - 99 mg/dL    Comment: Glucose reference range applies only to samples taken after fasting for at least 8 hours.  Urinalysis, Routine w reflex microscopic -Urine, Clean Catch     Status: Abnormal   Collection Time: 02/24/23 11:52 AM  Result Value Ref Range   Color, Urine YELLOW YELLOW   APPearance CLEAR CLEAR   Specific Gravity, Urine 1.011 1.005 - 1.030   pH 5.0 5.0 - 8.0   Glucose, UA NEGATIVE NEGATIVE mg/dL   Hgb urine dipstick MODERATE (A) NEGATIVE   Bilirubin Urine NEGATIVE NEGATIVE   Ketones, ur NEGATIVE NEGATIVE mg/dL   Protein, ur NEGATIVE NEGATIVE mg/dL   Nitrite NEGATIVE NEGATIVE   Leukocytes,Ua SMALL (A) NEGATIVE   RBC / HPF 21-50 0 - 5 RBC/hpf   WBC, UA 21-50 0 - 5 WBC/hpf   Bacteria, UA  RARE (A) NONE SEEN   Squamous Epithelial / HPF 0-5 0 - 5 /HPF   Mucus PRESENT     Comment: Performed at [facility], [address], [place], [place] [address]  Urine Culture (for pregnant, neutropenic or urologic patients or patients with an indwelling urinary catheter)     Status: None   Collection Time: 02/24/23 11:52 AM   Specimen:  Urine, Clean Catch  Result Value Ref Range   Specimen Description      URINE, CLEAN CATCH Performed at [facility], [address] [place], [place] [address]    Special Requests      NONE Performed at [facility], [address], [place], [place] [address]    Culture      NO GROWTH Performed at [facility], [address], [place], [place] [address]    Report Status 02/26/2023 FINAL   Comprehensive metabolic panel     Status: Abnormal   Collection Time: 02/24/23 12:39 PM  Result Value Ref Range   Sodium 141 135 - 145 mmol/L   Potassium 4.4 3.5 - 5.1 mmol/L   Chloride 108 98 - 111 mmol/L   CO2 23 22 - 32 mmol/L   Glucose, Bld 211 (H) 70 - 99 mg/dL    Comment: Glucose reference range applies only to samples taken after fasting for at least 8 hours.   BUN 22 (H) 6 - 20 mg/dL   Creatinine, Ser 5.36 (H) 0.61 - 1.24 mg/dL   Calcium 9.0 8.9 - 64.4 mg/dL   Total Protein 7.3 6.5 - 8.1 g/dL   Albumin 3.6 3.5 - 5.0 g/dL   AST 16 15 - 41 U/L   ALT 19 0 - 44 U/L   Alkaline Phosphatase 74 38 - 126 U/L   Total Bilirubin 0.7 0.3 - 1.2 mg/dL   GFR, Estimated 33 (L) >60 mL/min    Comment: (NOTE) Calculated using the CKD-EPI Creatinine Equation (2021)    Anion gap 10 5 - 15    Comment: Performed at [facility], [address] [place], [place] [address]  CBC with Differential     Status: Abnormal   Collection Time: 02/24/23 12:39 PM  Result Value Ref Range   WBC 9.5 4.0 - 10.5 K/uL   RBC 4.26 4.22 - 5.81 MIL/uL   Hemoglobin 12.8 (L) 13.0 - 17.0 g/dL   HCT 25.9 (L) 56.3 - 87.5 %   MCV 90.6 80.0 - 100.0 fL   MCH 30.0 26.0 - 34.0 pg   MCHC 33.2 30.0 - 36.0 g/dL   RDW 64.3 32.9 - 51.8 %   Platelets 246 150 - 400 K/uL   nRBC 0.0 0.0 - 0.2 %   Neutrophils Relative % 65 %   Neutro Abs 6.1 1.7 - 7.7 K/uL   Lymphocytes Relative 27 %   Lymphs Abs 2.6 0.7 - 4.0 K/uL   Monocytes Relative 5 %   Monocytes Absolute 0.5 0.1 -  1.0 K/uL   Eosinophils Relative 2 %   Eosinophils Absolute 0.2 0.0 - 0.5 K/uL   Basophils Relative 0 %   Basophils Absolute 0.0 0.0 - 0.1 K/uL   Immature Granulocytes 1 %   Abs Immature Granulocytes 0.05 0.00 - 0.07 K/uL    Comment: Performed at [facility], [address], [place], [place] [address]  TSH     Status: Abnormal   Collection Time: 02/24/23 12:39 PM  Result Value Ref Range  TSH 7.002 (H) 0.350 - 4.500 uIU/mL    Comment: Performed by a 3rd Generation assay with a functional sensitivity of <=0.01 uIU/mL. Performed at [facility], [address], [place], [place] [address]   T4, free     Status: Abnormal   Collection Time: 02/24/23 12:39 PM  Result Value Ref Range   Free T4 1.21 (H) 0.61 - 1.12 ng/dL    Comment: (NOTE) Biotin ingestion may interfere with free T4 tests. If the results are inconsistent with the TSH level, previous test results, or the clinical presentation, then consider biotin interference. If needed, order repeat testing after stopping biotin. Performed at [facility], [address] [place], [place] [address]   Glucose, capillary     Status: Abnormal   Collection Time: 02/24/23  6:12 PM  Result Value Ref Range   Glucose-Capillary 130 (H) 70 - 99 mg/dL    Comment: Glucose reference range applies only to samples taken after fasting for at least 8 hours.  MRSA Next Gen by PCR, Nasal     Status: None   Collection Time: 02/24/23  6:18 PM   Specimen: Nasal Mucosa; Nasal Swab  Result Value Ref Range   MRSA by PCR Next Gen NOT DETECTED NOT DETECTED    Comment: (NOTE) The GeneXpert MRSA Assay (FDA approved for NASAL specimens only), is one component of a comprehensive MRSA colonization surveillance program. It is not intended to diagnose MRSA infection nor to guide or monitor treatment for MRSA infections. Test performance is not FDA approved in patients less than 26 years old. Performed at [facility], [address] [place], [place] [address]   Glucose, capillary     Status: Abnormal   Collection Time: 02/25/23 12:57 AM  Result Value Ref Range   Glucose-Capillary 194 (H) 70 - 99 mg/dL    Comment: Glucose reference range applies only to samples taken after fasting for at least 8 hours.  Glucose, capillary     Status: Abnormal   Collection Time: 02/25/23  7:47 AM  Result Value Ref Range   Glucose-Capillary 145 (H) 70 - 99 mg/dL    Comment: Glucose reference range applies only to samples taken after fasting for at least 8 hours.  CBC     Status: Abnormal   Collection Time: 02/25/23  8:22 AM  Result Value Ref Range   WBC 10.5 4.0 - 10.5 K/uL   RBC 4.01 (L) 4.22 - 5.81 MIL/uL   Hemoglobin 12.0 (L) 13.0 - 17.0 g/dL   HCT 56.2 (L) 13.0 - 86.5 %   MCV 91.0 80.0 - 100.0 fL   MCH 29.9 26.0 - 34.0 pg   MCHC 32.9 30.0 - 36.0 g/dL   RDW 78.4 69.6 - 29.5 %   Platelets 234 150 - 400 K/uL   nRBC 0.0 0.0 - 0.2 %    Comment: Performed at [facility], [address], [place], [place] [address]  Basic metabolic panel     Status: Abnormal   Collection Time: 02/25/23  8:22 AM  Result Value Ref Range   Sodium 138 135 - 145 mmol/L   Potassium 4.4 3.5 - 5.1 mmol/L   Chloride 106 98 - 111 mmol/L   CO2 21 (L) 22 - 32 mmol/L   Glucose, Bld 157 (H) 70 - 99 mg/dL    Comment: Glucose reference range applies only to samples taken after fasting for at least 8 hours.   BUN 23 (H) 6 -  20 mg/dL   Creatinine, Ser 5.36 (H) 0.61 - 1.24 mg/dL   Calcium 8.2 (L) 8.9 - 10.3 mg/dL   GFR, Estimated 27 (L) >60 mL/min    Comment: (NOTE) Calculated using the CKD-EPI Creatinine Equation (2021)    Anion gap 11 5 - 15    Comment: Performed at [facility], [address], [place], [place] [address]  Calculi, with Photograph (to Clinical Lab)     Status: None   Collection Time: 02/25/23  9:50 AM  Result Value Ref Range   Source Calculi Comment     Comment:  Not provided CORRECTED ON 09/14 AT 1835: PREVIOUSLY REPORTED AS LFT RENAL    Color Calculi Orange    Size Calculi 3x2 mm    Comment: (NOTE) Multiple pieces received.  Dimensions of the largest piece reported.    Weight Calculi 8 mg   Composition Calculi Comment     Comment: Percentage (Represents the % composition)   Uric Acid Calculi 100 %   Photo Calculi Comment     Comment: Photograph will follow under a separate cover   Comment Calculi 3 Comment     Comment: (NOTE) Physician questions regarding Calculi Analysis contact [facility] at: [phone].    Please Note: Comment     Comment: (NOTE) Calculi report will follow via computer, mail or courier delivery.    DISCLAIMER: Comment     Comment: (NOTE) This test was developed and its performance characteristics determined by [facility].  It has not been cleared or approved by the Food and Drug Administration. Performed At: [facility] [address] [place], [place] [id number] Feliciana Rossetti PhD [id number]   Glucose, capillary     Status: Abnormal   Collection Time: 02/25/23 10:15 AM  Result Value Ref Range   Glucose-Capillary 147 (H) 70 - 99 mg/dL    Comment: Glucose reference range applies only to samples taken after fasting for at least 8 hours.  Glucose, capillary     Status: Abnormal   Collection Time: 02/25/23 11:39 AM  Result Value Ref Range   Glucose-Capillary 151 (H) 70 - 99 mg/dL    Comment: Glucose reference range applies only to samples taken after fasting for at least 8 hours.  Glucose, capillary     Status: Abnormal   Collection Time: 02/25/23  3:48 PM  Result Value Ref Range   Glucose-Capillary 291 (H) 70 - 99 mg/dL    Comment: Glucose reference range applies only to samples taken after fasting for at least 8 hours.   Comment 1 Notify RN    Comment 2 Document in Chart   ECHOCARDIOGRAM COMPLETE     Status: None   Collection Time: 02/25/23  3:50 PM  Result Value Ref Range   Weight 4,479.75 oz    Height 70 in   BP 128/109 mmHg   Single Plane A2C EF 62.0 %   Single Plane A4C EF 43.2 %   Calc EF 52.9 %   S' Lateral 3.90 cm   Area-P 1/2 4.31 cm2   Est EF 55 - 60%   Glucose, capillary     Status: Abnormal   Collection Time: 02/25/23 10:15 PM  Result Value Ref Range   Glucose-Capillary 345 (H) 70 - 99 mg/dL    Comment: Glucose reference range applies only to samples taken after fasting for at least 8 hours.  Glucose, capillary     Status: Abnormal   Collection Time: 02/26/23  3:41 AM  Result Value Ref Range  Glucose-Capillary 217 (H) 70 - 99 mg/dL    Comment: Glucose reference range applies only to samples taken after fasting for at least 8 hours.  Basic metabolic panel     Status: Abnormal   Collection Time: 02/26/23  4:24 AM  Result Value Ref Range   Sodium 137 135 - 145 mmol/L   Potassium 4.9 3.5 - 5.1 mmol/L   Chloride 107 98 - 111 mmol/L   CO2 21 (L) 22 - 32 mmol/L   Glucose, Bld 241 (H) 70 - 99 mg/dL    Comment: Glucose reference range applies only to samples taken after fasting for at least 8 hours.   BUN 24 (H) 6 - 20 mg/dL   Creatinine, Ser 5.36 (H) 0.61 - 1.24 mg/dL   Calcium 8.3 (L) 8.9 - 10.3 mg/dL   GFR, Estimated 40 (L) >60 mL/min    Comment: (NOTE) Calculated using the CKD-EPI Creatinine Equation (2021)    Anion gap 9 5 - 15    Comment: Performed at [facility], [address] [place], [place] [address]  CBC     Status: Abnormal   Collection Time: 02/26/23  4:24 AM  Result Value Ref Range   WBC 13.2 (H) 4.0 - 10.5 K/uL   RBC 3.85 (L) 4.22 - 5.81 MIL/uL   Hemoglobin 11.7 (L) 13.0 - 17.0 g/dL   HCT 47.4 (L) 25.9 - 56.3 %   MCV 90.4 80.0 - 100.0 fL   MCH 30.4 26.0 - 34.0 pg   MCHC 33.6 30.0 - 36.0 g/dL   RDW 87.5 64.3 - 32.9 %   Platelets 253 150 - 400 K/uL   nRBC 0.0 0.0 - 0.2 %    Comment: Performed at [facility], [address], [place], [place] [address]  Glucose, capillary     Status: Abnormal    Collection Time: 02/26/23  7:44 AM  Result Value Ref Range   Glucose-Capillary 199 (H) 70 - 99 mg/dL    Comment: Glucose reference range applies only to samples taken after fasting for at least 8 hours.  Glucose, capillary     Status: Abnormal   Collection Time: 02/26/23 11:42 AM  Result Value Ref Range   Glucose-Capillary 254 (H) 70 - 99 mg/dL    Comment: Glucose reference range applies only to samples taken after fasting for at least 8 hours.  Basic metabolic panel     Status: Abnormal   Collection Time: 02/26/23  2:25 PM  Result Value Ref Range   Sodium 137 135 - 145 mmol/L   Potassium 4.5 3.5 - 5.1 mmol/L   Chloride 106 98 - 111 mmol/L   CO2 22 22 - 32 mmol/L   Glucose, Bld 288 (H) 70 - 99 mg/dL    Comment: Glucose reference range applies only to samples taken after fasting for at least 8 hours.   BUN 24 (H) 6 - 20 mg/dL   Creatinine, Ser 1.66 (H) 0.61 - 1.24 mg/dL   Calcium 8.3 (L) 8.9 - 10.3 mg/dL   GFR, Estimated 53 (L) >60 mL/min    Comment: (NOTE) Calculated using the CKD-EPI Creatinine Equation (2021)    Anion gap 9 5 - 15    Comment: Performed at [facility], [address] [place], [place] [address]  Glucose, capillary     Status: Abnormal   Collection Time: 02/26/23  4:30 PM  Result Value Ref Range   Glucose-Capillary 278 (H) 70 - 99 mg/dL    Comment:  Glucose reference range applies only to samples taken after fasting for at least 8 hours.  Bayer DCA Hb A1c Waived     Status: Abnormal   Collection Time: 03/02/23 12:35 PM  Result Value Ref Range   HB A1C (BAYER DCA - WAIVED) 7.4 (H) 4.8 - 5.6 %    Comment:          Prediabetes: 5.7 - 6.4          Diabetes: >6.4          Glycemic control for adults with diabetes: <7.0   CBC with Differential/Platelet     Status: Abnormal   Collection Time: 03/02/23 12:36 PM  Result Value Ref Range   WBC 9.7 3.4 - 10.8 x10E3/uL   RBC 4.16 4.14 - 5.80 x10E6/uL   Hemoglobin 12.4 (L) 13.0 - 17.7 g/dL    Hematocrit 65.7 84.6 - 51.0 %   MCV 93 79 - 97 fL   MCH 29.8 26.6 - 33.0 pg   MCHC 32.1 31.5 - 35.7 g/dL   RDW 96.2 95.2 - 84.1 %   Platelets 305 150 - 450 x10E3/uL   Neutrophils 54 Not Estab. %   Lymphs 34 Not Estab. %   Monocytes 6 Not Estab. %   Eos 3 Not Estab. %   Basos 1 Not Estab. %   Neutrophils Absolute 5.3 1.4 - 7.0 x10E3/uL   Lymphocytes Absolute 3.3 (H) 0.7 - 3.1 x10E3/uL   Monocytes Absolute 0.6 0.1 - 0.9 x10E3/uL   EOS (ABSOLUTE) 0.3 0.0 - 0.4 x10E3/uL   Basophils Absolute 0.1 0.0 - 0.2 x10E3/uL   Immature Granulocytes 2 Not Estab. %   Immature Grans (Abs) 0.2 (H) 0.0 - 0.1 x10E3/uL    Comment: (An elevated percentage of Immature Granulocytes has not been found to be clinically significant as a sole clinical predictor of disease. Does NOT include bands or blast cells.  Pregnancy associated physiological leukocytosis may also show increased immature granulocytes without clinical significance.)   CMP14+EGFR     Status: Abnormal   Collection Time: 03/02/23 12:36 PM  Result Value Ref Range   Glucose 149 (H) 70 - 99 mg/dL   BUN 16 6 - 24 mg/dL   Creatinine, Ser 3.24 0.76 - 1.27 mg/dL   eGFR 90 >40 NU/UVO/5.36   BUN/Creatinine Ratio 15 9 - 20   Sodium 141 134 - 144 mmol/L   Potassium 5.3 (H) 3.5 - 5.2 mmol/L   Chloride 104 96 - 106 mmol/L   CO2 24 20 - 29 mmol/L   Calcium 10.0 8.7 - 10.2 mg/dL   Total Protein 7.1 6.0 - 8.5 g/dL   Albumin 4.1 4.1 - 5.1 g/dL   Globulin, Total 3.0 1.5 - 4.5 g/dL   Bilirubin Total 0.4 0.0 - 1.2 mg/dL   Alkaline Phosphatase 90 44 - 121 IU/L   AST 25 0 - 40 IU/L   ALT 34 0 - 44 IU/L  Lipid panel     Status: Abnormal   Collection Time: 03/02/23 12:36 PM  Result Value Ref Range   Cholesterol, Total 178 100 - 199 mg/dL   Triglycerides 644 (H) 0 - 149 mg/dL   HDL 29 (L) >03 mg/dL   VLDL Cholesterol Cal 30 5 - 40 mg/dL   LDL Chol Calc (NIH) 474 (H) 0 - 99 mg/dL   Chol/HDL Ratio 6.1 (H) 0.0 - 5.0 ratio    Comment:  T. Chol/HDL Ratio                                             Men  Women                               1/2 Avg.Risk  3.4    3.3                                   Avg.Risk  5.0    4.4                                2X Avg.Risk  9.6    7.1                                3X Avg.Risk 23.4   11.0   Glucose, capillary     Status: Abnormal   Collection Time: 03/19/23  9:07 AM  Result Value Ref Range   Glucose-Capillary 196 (H) 70 - 99 mg/dL    Comment: Glucose reference range applies only to samples taken after fasting for at least 8 hours.  Glucose, capillary     Status: Abnormal   Collection Time: 03/30/23  7:05 AM  Result Value Ref Range   Glucose-Capillary 189 (H) 70 - 99 mg/dL    Comment: Glucose reference range applies only to samples taken after fasting for at least 8 hours.  Glucose, capillary     Status: Abnormal   Collection Time: 03/30/23  8:35 AM  Result Value Ref Range   Glucose-Capillary 192 (H) 70 - 99 mg/dL    Comment: Glucose reference range applies only to samples taken after fasting for at least 8 hours.   Comment 1 Notify RN    Diabetic Labs (most recent): Lab Results  Component Value Date   HGBA1C 7.4 (H) 03/02/2023   HGBA1C 7.2 (H) 02/18/2023   HGBA1C 7.1 (H) 08/28/2022   MICROALBUR 29.5 02/12/2020    Lipid Panel     Component Value Date/Time   CHOL 178 03/02/2023 1236   TRIG 167 (H) 03/02/2023 1236   HDL 29 (L) 03/02/2023 1236   CHOLHDL 6.1 (H) 03/02/2023 1236   LDLCALC 119 (H) 03/02/2023 1236   LABVLDL 30 03/02/2023 1236    Assessment & Plan:   1. Postsurgical hypothyroidism  His recent thyroid function tests are discordant showing high free T4 and high TSH.  For safety reasons, he is advised to stay on his current dose of levothyroxine 200 mcg p.o. daily at breakfast.     - We discussed about the correct intake of his thyroid hormone, on empty stomach at fasting, with water, separated by at least 30 minutes from breakfast and other  medications,  and separated by more than 4 hours from calcium, iron, multivitamins, acid reflux medications (PPIs). -Patient is made aware of the fact that thyroid hormone replacement is needed for life, dose to be adjusted by periodic monitoring of thyroid function tests.   2. 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 - After his surgery, he received  of I-131 remnant ablation on 05/05/13,  post therapy whole body scar showing no evidence of distant metastasis.  - surveillance ultrasound of the neck/thyroid in February 2016 was negative for thyroid remnants.  - whole-body scan with Thyrogen was negative for tumor recurrence on 07/05/2015.  -Another surveillance  thyroid/neck ultrasound on 06/09/2016 is remarkable for surgically absent thyroid.  - on October 04, 2017 he underwent another surveillance Thyrogen stimulated  whole-body scan with negative findings for tumor recurrence or distant metastasis. -His previous labs show thyroglobulin level undetectable (< 2) -Previsit surveillance thyroid/neck ultrasound negative for  residual thyroid tissue or recurrence or lymphadenopathy.  -September 2022 Thyrogen stimulated whole-body scan is unremarkable and favorable for tumor remission.   -His next labs will include thyroglobulin and thyroglobulin bodies.  Further imaging study will be determined based on his thyroglobulin findings.  2.  Type 2 diabetes: Recent diagnosis, worsening. -His previsit labs show A1c of 7.4%, increasing from 7.1%.  He is advised to continue metformin 1000 mg p.o. daily.  At this point, he may need additional intervention.  I discussed initiated Jardiance 10 mg p.o. daily at  breakfast.  Side effects and precautions discussed with him.   I had a long discussion with this patient on potential complications of uncontrolled diabetes.  His diabetes so far is complicated by obesity/sedentary life and patient remains at exceedingly high risk for complications including    CAD, CVA, CKD, retinopathy, and neuropathy. These are all discussed in detail with him.  - I have counseled him on diet management and weight loss, by adopting a carbohydrate restricted/protein rich diet.  - he acknowledges that there is a room for improvement in his food and drink choices. - Suggestion is made for him to avoid simple carbohydrates  from his diet including Cakes, Sweet Desserts, Ice Cream, Soda (diet and regular), Sweet Tea, Candies, Chips, Cookies, Store Bought Juices, Alcohol in Excess of  1-2 drinks a day, Artificial Sweeteners,  Coffee Creamer, and "Sugar-free" Products, Lemonade. This will help patient to have more stable blood glucose profile and potentially avoid unintended weight gain.  - I encouraged him to switch to  unprocessed or minimally processed complex starch and increased protein intake (animal or plant source), fruits, and vegetables.    -In light of his history of thyroid malignancy, he is not an optimal candidate for GLP-1 receptor agonists nor DPP 4 inhibitors.    3) hypertension-his blood pressure is uncontrolled despite multiple medications.  He has questionable compliance with medications, salt intake.  He is sedentary these days.  He is currently taking Entresto 97/103 mg p.o. twice daily, carvedilol 3.125 mg p.o. twice daily, metoprolol 100 mg p.o. daily.  Clearly double beta-blockers.  He will benefit from switching one of his beta-blockers to a different class of blood pressure medications.  He wishes to address this with his PMD.  I suggest either low-dose clonidine or HCTZ.   4) hyperlipidemia-uncontrolled lipid panel with LDL of 119 and triglycerides of 167.    He is advised to continue pravastatin 40 mg p.o. nightly.  Admittedly, he has been hesitant to take this medication.  He absolutely needs it unless his engagement with whole food plant-based diet is optimal.     5) vitamin D deficiency: He is status post treatment with vitamin D2  50,000 units weekly for several weeks.  After his next measurement, he will be considered for maintenance with vitamin D to 2000 units daily.    6) hypogonadism: He is not a suitable candidate for androgen replacement therapy at this time given his history of sleep apnea, hyperlipidemia. He is interested to engage in workup to establish etiology.  I ordered repeat labs including gonadotropins, prolactin, ferritin, PSA before initiating any androgen replacement therapy.  He would benefit the most from weight loss to improve his SHBG.   - Patient specific target  A1c;  LDL, HDL, Triglycerides, and  Waist Circumference were discussed in detail.   - I advised patient to maintain close follow up with Rakes, Doralee Albino, FNP for primary care needs.  I spent  42  minutes in the care of the patient today including review of labs from Thyroid Function, CMP, and other relevant labs ; imaging/biopsy records (current and previous including abstractions from other facilities); face-to-face time discussing  his lab results and symptoms, medications doses, his options of short and long term treatment based on the latest standards of care / guidelines;   and documenting the encounter.  Mary Sella Mclarty  participated in the discussions, expressed understanding, and voiced agreement with the above plans.  All questions were answered to his satisfaction. he is encouraged to contact clinic should he have any questions or concerns prior to his return visit.   Follow up plan: Return in about 4 months (around 08/10/2023) for Fasting Labs  in AM B4 8, A1c -[place].  Marquis Lunch, MD Phone: [phone]  Fax: [phone]  This note was partially  dictated with voice recognition software. Similar sounding words can be transcribed inadequately or may not  be corrected upon review.  04/09/2023, 1:13 PM

## 2023-04-09 NOTE — Patient Instructions (Signed)

## 2023-04-20 ENCOUNTER — Other Ambulatory Visit: Payer: Self-pay | Admitting: Family Medicine

## 2023-04-23 DIAGNOSIS — E291 Testicular hypofunction: Secondary | ICD-10-CM | POA: Diagnosis not present

## 2023-04-23 DIAGNOSIS — Z87442 Personal history of urinary calculi: Secondary | ICD-10-CM | POA: Diagnosis not present

## 2023-04-28 ENCOUNTER — Encounter: Payer: Self-pay | Admitting: Family Medicine

## 2023-05-13 ENCOUNTER — Ambulatory Visit: Payer: BC Managed Care – PPO | Admitting: *Deleted

## 2023-05-13 DIAGNOSIS — E1165 Type 2 diabetes mellitus with hyperglycemia: Secondary | ICD-10-CM | POA: Diagnosis not present

## 2023-05-13 LAB — HM DIABETES EYE EXAM

## 2023-05-13 NOTE — Progress Notes (Signed)
Tyler [name] arrived 05/13/2023 and has given verbal consent to obtain images and complete their overdue diabetic retinal screening.  The images have been sent to an ophthalmologist or optometrist for review and interpretation.  Results will be sent back to Tyler Masters, FNP for review.  Patient has been informed they will be contacted when we receive the results via telephone or MyChart

## 2023-06-02 ENCOUNTER — Encounter (HOSPITAL_BASED_OUTPATIENT_CLINIC_OR_DEPARTMENT_OTHER): Payer: Self-pay | Admitting: Urology

## 2023-06-02 ENCOUNTER — Emergency Department (HOSPITAL_BASED_OUTPATIENT_CLINIC_OR_DEPARTMENT_OTHER): Payer: BC Managed Care – PPO

## 2023-06-02 ENCOUNTER — Emergency Department (HOSPITAL_BASED_OUTPATIENT_CLINIC_OR_DEPARTMENT_OTHER)
Admission: EM | Admit: 2023-06-02 | Discharge: 2023-06-02 | Disposition: A | Discharge status: Home / Self Care | Payer: BC Managed Care – PPO | Attending: Emergency Medicine | Admitting: Emergency Medicine

## 2023-06-02 ENCOUNTER — Other Ambulatory Visit: Payer: Self-pay

## 2023-06-02 DIAGNOSIS — Z20822 Contact with and (suspected) exposure to covid-19: Secondary | ICD-10-CM | POA: Diagnosis not present

## 2023-06-02 DIAGNOSIS — Z79899 Other long term (current) drug therapy: Secondary | ICD-10-CM | POA: Insufficient documentation

## 2023-06-02 DIAGNOSIS — M25571 Pain in right ankle and joints of right foot: Secondary | ICD-10-CM | POA: Diagnosis not present

## 2023-06-02 DIAGNOSIS — M25562 Pain in left knee: Secondary | ICD-10-CM

## 2023-06-02 DIAGNOSIS — I110 Hypertensive heart disease with heart failure: Secondary | ICD-10-CM | POA: Diagnosis not present

## 2023-06-02 DIAGNOSIS — E119 Type 2 diabetes mellitus without complications: Secondary | ICD-10-CM | POA: Diagnosis not present

## 2023-06-02 DIAGNOSIS — I2510 Atherosclerotic heart disease of native coronary artery without angina pectoris: Secondary | ICD-10-CM | POA: Diagnosis not present

## 2023-06-02 DIAGNOSIS — M25561 Pain in right knee: Secondary | ICD-10-CM | POA: Insufficient documentation

## 2023-06-02 DIAGNOSIS — Z7982 Long term (current) use of aspirin: Secondary | ICD-10-CM | POA: Diagnosis not present

## 2023-06-02 DIAGNOSIS — I509 Heart failure, unspecified: Secondary | ICD-10-CM | POA: Insufficient documentation

## 2023-06-02 DIAGNOSIS — R059 Cough, unspecified: Secondary | ICD-10-CM | POA: Insufficient documentation

## 2023-06-02 DIAGNOSIS — M791 Myalgia, unspecified site: Secondary | ICD-10-CM | POA: Insufficient documentation

## 2023-06-02 DIAGNOSIS — R52 Pain, unspecified: Secondary | ICD-10-CM

## 2023-06-02 DIAGNOSIS — I10 Essential (primary) hypertension: Secondary | ICD-10-CM | POA: Diagnosis not present

## 2023-06-02 LAB — CBC WITH DIFFERENTIAL/PLATELET
Abs Immature Granulocytes: 0.08 10*3/uL — ABNORMAL HIGH (ref 0.00–0.07)
Basophils Absolute: 0 10*3/uL (ref 0.0–0.1)
Basophils Relative: 0 %
Eosinophils Absolute: 0.2 10*3/uL (ref 0.0–0.5)
Eosinophils Relative: 1 %
HCT: 44.1 % (ref 39.0–52.0)
Hemoglobin: 14.6 g/dL (ref 13.0–17.0)
Immature Granulocytes: 1 %
Lymphocytes Relative: 28 %
Lymphs Abs: 3.5 10*3/uL (ref 0.7–4.0)
MCH: 29.6 pg (ref 26.0–34.0)
MCHC: 33.1 g/dL (ref 30.0–36.0)
MCV: 89.5 fL (ref 80.0–100.0)
Monocytes Absolute: 0.7 10*3/uL (ref 0.1–1.0)
Monocytes Relative: 6 %
Neutro Abs: 8.2 10*3/uL — ABNORMAL HIGH (ref 1.7–7.7)
Neutrophils Relative %: 64 %
Platelets: 278 10*3/uL (ref 150–400)
RBC: 4.93 MIL/uL (ref 4.22–5.81)
RDW: 12.1 % (ref 11.5–15.5)
WBC: 12.7 10*3/uL — ABNORMAL HIGH (ref 4.0–10.5)
nRBC: 0 % (ref 0.0–0.2)

## 2023-06-02 LAB — URINALYSIS, ROUTINE W REFLEX MICROSCOPIC
Bilirubin Urine: NEGATIVE
Glucose, UA: >=500 mg/dL — AB
Hgb urine dipstick: NEGATIVE
Ketones, ur: NEGATIVE mg/dL
Leukocytes,Ua: NEGATIVE
Nitrite: NEGATIVE
Protein, ur: NEGATIVE mg/dL
Specific Gravity, Urine: 1.01 (ref 1.005–1.030)
pH: 6.5 (ref 5.0–8.0)

## 2023-06-02 LAB — COMPREHENSIVE METABOLIC PANEL
ALT: 36 U/L (ref 0–44)
AST: 25 U/L (ref 15–41)
Albumin: 3.8 g/dL (ref 3.5–5.0)
Alkaline Phosphatase: 74 U/L (ref 38–126)
Anion gap: 11 (ref 5–15)
BUN: 12 mg/dL (ref 6–20)
CO2: 20 mmol/L — ABNORMAL LOW (ref 22–32)
Calcium: 9.2 mg/dL (ref 8.9–10.3)
Chloride: 105 mmol/L (ref 98–111)
Creatinine, Ser: 1 mg/dL (ref 0.61–1.24)
GFR, Estimated: >60 mL/min (ref >60)
Glucose, Bld: 235 mg/dL — ABNORMAL HIGH (ref 70–99)
Potassium: 3.9 mmol/L (ref 3.5–5.1)
Sodium: 136 mmol/L (ref 135–145)
Total Bilirubin: 0.8 mg/dL (ref <1.2)
Total Protein: 7.7 g/dL (ref 6.5–8.1)

## 2023-06-02 LAB — RESP PANEL BY RT-PCR (RSV, FLU A&B, COVID)  RVPGX2
Influenza A by PCR: NEGATIVE
Influenza B by PCR: NEGATIVE
Resp Syncytial Virus by PCR: NEGATIVE
SARS Coronavirus 2 by RT PCR: NEGATIVE

## 2023-06-02 LAB — URINALYSIS, MICROSCOPIC (REFLEX): Bacteria, UA: NONE SEEN

## 2023-06-02 LAB — CK: Total CK: 55 U/L (ref 49–397)

## 2023-06-02 MED ORDER — OXYCODONE HCL 5 MG PO TABS
5.0000 mg | ORAL_TABLET | Freq: Four times a day (QID) | ORAL | 0 refills | Status: DC | PRN
Start: 2023-06-02 — End: 2023-06-04

## 2023-06-02 MED ORDER — OXYCODONE HCL 5 MG PO TABS
5.0000 mg | ORAL_TABLET | Freq: Once | ORAL | Status: AC
  Administered 2023-06-02: 5 mg via ORAL
  Filled 2023-06-02: qty 1

## 2023-06-02 MED ORDER — KETOROLAC TROMETHAMINE 15 MG/ML IJ SOLN
15.0000 mg | Freq: Once | INTRAMUSCULAR | Status: AC
  Administered 2023-06-02: 15 mg via INTRAVENOUS
  Filled 2023-06-02: qty 1

## 2023-06-02 MED ORDER — ACETAMINOPHEN 500 MG PO TABS
1000.0000 mg | ORAL_TABLET | Freq: Once | ORAL | Status: AC
  Administered 2023-06-02: 1000 mg via ORAL
  Filled 2023-06-02: qty 2

## 2023-06-02 NOTE — ED Triage Notes (Signed)
Pt states right leg pain and worse in right knee that started 2 days ago  States concern for gout flare up  On new med Jardiance and he is urinating more  Has sore to right side groin that is getting better

## 2023-06-02 NOTE — ED Provider Notes (Signed)
EMERGENCY DEPARTMENT AT [facility] Provider Note   CSN: [id number] Arrival date & time: 06/02/23  1004     History  Chief Complaint  Patient presents with   Multiple Complaints     Tyler [name] is a 47 y.o. male with PMH as listed below who presents with multiple complaints.  Pt states right leg pain and worse in right knee that started 2 days ago  States concern for gout flare up, as he has had gout before but never in his knees and hasn't had flare up in over a year. Endorses mild cough with no productive phlegm. Endorses body aches in BL legs, arms for several days. Endorses pain worse in R knee and R ankle. Also endorses R wrist is "inflamed" and had to wear a brace but that seems to be improving.  He also reports that he's on new med Jardiance that he's been on for approximately one month and is urinating more.  Also reports an infection in the "groin" like a cyst that has improved after it "popped and drained for 3-4 days." Was foul smelling. Put some of his son's diaper ointment on it and thinks it is improving. Had two lesions down there but the other one improved. Denies f/c, sore throat, nasal congestion, rhinorrhea. Wife and child had some viral illnesses recently and had negative resp panel swabs including GAS.     Past Medical History:  Diagnosis Date   Arthritis    Cardiomyopathy (HCC)    CHF (congestive heart failure) (HCC)    Complication of anesthesia    Woke up during surgery   Coronary artery disease    Diabetes (HCC)    Gout    High cholesterol    Hypertension    Low testosterone 04/17/2021   Malignant neoplasm of thyroid gland (HCC) 04/17/2016   Pneumonia    Postsurgical hypothyroidism    thyroid cancer   Sleep apnea    Vitamin D insufficiency        Home Medications Prior to Admission medications   Medication Sig Start Date End Date Taking? Authorizing Provider  Accu-Chek Softclix Lancets lancets Test BS morning, noon  and at bedtime Dx E11.65 03/29/23   Sonny Masters, FNP  amLODipine (NORVASC) 5 MG tablet Take 5 mg by mouth daily. Patient not taking: Reported on 06/04/2023    [provider]  aspirin EC 81 MG tablet Take 81 mg by mouth daily. 12/07/22   [provider]  Blood Glucose Monitoring Suppl DEVI 1 each by Does not apply route in the morning, at noon, and at bedtime. May substitute to any manufacturer covered by patient's insurance. 03/03/23   Sonny Masters, FNP  carvedilol (COREG) 12.5 MG tablet Take 1 tablet (12.5 mg total) by mouth 2 (two) times daily with a meal. 02/20/23   Almon Hercules, MD  celecoxib (CELEBREX) 200 MG capsule Take 200 mg by mouth 2 (two) times daily.    [provider]  colchicine 0.6 MG tablet TAKE 2 TABLETS AT ONSET, THEN 1 TABLET 2 HOURS LATER- A MAXIMUM OF 3 TABLETS IN 24 HOURS Patient taking differently: Take 0.6-1.2 mg by mouth See admin instructions. TAKE 1.2 mg (2 TABLETS) by mouth AT ONSET OF GOUT FLARE, THEN 0.6 mg (1 TABLET) 2 HOURS LATER AS NEEDED- A MAXIMUM OF 1.8 mg (3 TABLETS IN 24 HOURS) 05/21/20   Nida, Denman George, MD  empagliflozin (JARDIANCE) 10 MG TABS tablet Take 1 tablet (10 mg  total) by mouth daily before breakfast. Patient not taking: Reported on 06/04/2023 04/09/23   Roma Kayser, MD  glucose blood (ACCU-CHEK GUIDE) test strip Test BS morning, noon and at bedtime Dx E11.65 03/29/23   Sonny Masters, FNP  Iron, Ferrous Sulfate, 325 (65 Fe) MG TABS Take 325 mg by mouth daily. 06/07/23   Sonny Masters, FNP  levothyroxine (SYNTHROID) 200 MCG tablet Take 200 mcg by mouth daily before breakfast.    [provider]  magnesium oxide (MAG-OX) 400 (240 Mg) MG tablet Take 400 mg by mouth at bedtime.    [provider]  meloxicam (MOBIC) 15 MG tablet Take 1 tablet (15 mg total) by mouth daily. 06/04/23   Sonny Masters, FNP  metFORMIN (GLUCOPHAGE) 1000 MG tablet TAKE 1 TABLET (1,000 MG TOTAL) BY MOUTH TWICE A DAY  WITH FOOD 03/29/23   Rakes, Doralee Albino, FNP  methocarbamol (ROBAXIN) 500 MG tablet Take 1 tablet (500 mg total) by mouth every 6 (six) hours as needed for muscle spasms. 09/08/22   Cassandria Anger, PA-C  mupirocin ointment (BACTROBAN) 2 % Apply 1 Application topically 2 (two) times daily as needed (as directed for irritation- affected areas).    [provider]  nystatin (MYCOSTATIN/NYSTOP) powder Apply 1 Application topically 2 (two) times daily as needed (as directed for irritation- affected areas).    [provider]  nystatin cream (MYCOSTATIN) Apply 1 Application topically 2 (two) times daily as needed (as directed for irritation- affected areas).    [provider]  omega-3 acid ethyl esters (LOVAZA) 1 g capsule TAKE 2 CAPSULES BY MOUTH TWICE A DAY 03/29/23   Sonny Masters, FNP  pravastatin (PRAVACHOL) 40 MG tablet TAKE 1 TABLET BY MOUTH EVERY DAY 06/23/22   Daryll Drown, NP  predniSONE (STERAPRED UNI-PAK 21 TAB) 5 MG (21) TBPK tablet Take 5 mg by mouth daily.    [provider]  sacubitril-valsartan (ENTRESTO) 97-103 MG Take 1 tablet by mouth 2 (two) times daily.    [provider]  TURMERIC CURCUMIN PO Take 1,000 mg by mouth 2 (two) times daily.    [provider]  TYLENOL 500 MG tablet Take 1,000 mg by mouth every 6 (six) hours as needed for mild pain or headache.    [provider]      Allergies    Isosorbide    Review of Systems   Review of Systems A 10 point review of systems was performed and is negative unless otherwise reported in HPI.  Physical Exam Updated Vital Signs BP 126/89   Pulse 72   Temp 97.8 F (36.6 C) (Oral)   Resp 16   Ht 5\' 10"  (1.778 m)   Wt 128.8 kg   SpO2 95%   BMI 40.74 kg/m  Physical Exam General: Normal appearing male, lying in bed.  HEENT: Sclera anicteric, MMM, trachea midline.  Cardiology: RRR, no murmurs/rubs/gallops. BL radial and DP pulses equal bilaterally.  Resp: Normal  respiratory rate and effort. CTAB, no wheezes, rhonchi, crackles.  Abd: Soft, non-tender, non-distended. No rebound tenderness or guarding.  GU: Exam performed with nurse chaperone. Normal appearing circumcised external genitalia. Normal appearing scrotum and testicles with normal lie, no TTP. Small 0.5 x 0.5 cm abscess, draining purulent fluid with no surrounding erythema, induration, located in proximal R inner thigh near scrotum. Minimal TTP of the area. Normal appearing perineum.  MSK: TTP diffusely throughout BL knees, R>L. Mild R knee joint effusion.Mild TTP to R  hip joint as well without any overlying swelling. No signs of trauma/deformity on any extremity. R wrist also with brace in place, mild TTP without effusion/swelling. No joint erythema, induration, fluctuance. Intact ROM of wrists. FROM of BL knees with intact flexion/extension though limited d/t pain.  Skin: warm, dry.  Back: No CVA tenderness Neuro: A&Ox4, CNs II-XII grossly intact. MAEs. Sensation grossly intact.  Psych: Normal mood and affect.   ED Results / Procedures / Treatments   Labs (all labs ordered are listed, but only abnormal results are displayed) Labs Reviewed  COMPREHENSIVE METABOLIC PANEL - Abnormal; Notable for the following components:      Result Value   CO2 20 (*)    Glucose, Bld 235 (*)    All other components within normal limits  CBC WITH DIFFERENTIAL/PLATELET - Abnormal; Notable for the following components:   WBC 12.7 (*)    Neutro Abs 8.2 (*)    Abs Immature Granulocytes 0.08 (*)    All other components within normal limits  URINALYSIS, ROUTINE W REFLEX MICROSCOPIC - Abnormal; Notable for the following components:   Glucose, UA >=500 (*)    All other components within normal limits  RESP PANEL BY RT-PCR (RSV, FLU A&B, COVID)  RVPGX2  CK  URINALYSIS, MICROSCOPIC (REFLEX)    EKG EKG Interpretation Date/Time:  Wednesday June 02 2023 13:29:31 EST Ventricular Rate:  74 PR  Interval:  206 QRS Duration:  150 QT Interval:  400 QTC Calculation: 444 R Axis:   -53  Text Interpretation: Sinus rhythm Borderline prolonged PR interval Left bundle branch block Confirmed by Vivi Barrack [phone]) on 06/02/2023 1:32:08 PM  Radiology CXR: No acute cardiopulmonary disease.   Procedures Procedures    Medications Ordered in ED Medications  acetaminophen (TYLENOL) tablet 1,000 mg (1,000 mg Oral Given 06/02/23 1336)  ketorolac (TORADOL) 15 MG/ML injection 15 mg (15 mg Intravenous Given 06/02/23 1337)  oxyCODONE (Oxy IR/ROXICODONE) immediate release tablet 5 mg (5 mg Oral Given 06/02/23 1531)    ED Course/ Medical Decision Making/ A&P                          Medical Decision Making Amount and/or Complexity of Data Reviewed Labs: ordered. Decision-making details documented in ED Course. Radiology: ordered. Decision-making details documented in ED Course.  Risk OTC drugs. Prescription drug management.    This patient presents to the ED for concern of multiple complaints, this involves an extensive number of treatment options, and is a complaint that carries with it a high risk of complications and morbidity.  I considered the following differential and admission for this acute, potentially life threatening condition.   MDM:    Body aches, joint pains, muscle pains, mild cough, fatigue/lethargy, nausea to me all consistent with viral syndrome. CXR without e/o PNA or bronchitis. CK neg for rhabdomyolysis.   Pain in multiple joints. Does have h/o gout. Does have pain with passive ROM of knee but also has pain in R ankle, R wrist. Low c/f septic arthritis d/t polyarthralgia, afebrile. Knee w/ intact flexion/extension though limited by pain.   Small area in groin examined with nurse chaperone. Appears to have small 0.5 x 0.5 cm draining abscess on inside of proximal right thigh near scrotum. Draining purulent fluid with no surrounding erythema, induration. Minimal  TTP of the area. Seems to have had small abscess that is draining, patient states it is improving, no e/o surrounding cellulitis. Does not require antibiotics or I&D. Instructed  patient to keep area clean/dry and monitor for clinical worsening.   UA neg for UTI. CMP with some hyperglycemia w/ known T2DM and otherwise Unremarkable in the context of this patient's presentation.   Overall patient's symptoms to me are consistent with viral illness. His symptoms somewhat improve after tylenol/toradol/oxy. Patient is ambulated with difficulty with knee and requests pain medication to help him sleep. Will give a few doses of oxycodone for rx. Instructed to alternate tylenol/ibuprofen. Given specific DC instructions/return precautions. Instructed to f/u with PCP within 1 week, and patient states he already has f/u on 12/13.   Clinical Course as of 06/08/23 0224  Wed Jun 02, 2023  1306 WBC(!): 12.7 +leukocytosis [HN]  1331 CK Total: 55 neg [HN]  1453 DG Chest Portable 1 View Neg [HN]    Clinical Course User Index [HN] Loetta Rough, MD    Labs: I Ordered, and personally interpreted labs.  The pertinent results include:  those listed above  Imaging Studies ordered: I ordered imaging studies including CXR I independently visualized and interpreted imaging. I agree with the radiologist interpretation  Additional history obtained from chart review, wife at bedside.    Cardiac Monitoring: The patient was maintained on a cardiac monitor.  I personally viewed and interpreted the cardiac monitored which showed an underlying rhythm of: NSR  Reevaluation: After the interventions noted above, I reevaluated the patient and found that they have :improved  Social Determinants of Health: Lives independently  Disposition:  DC w/ discharge instructions/return precautions. All questions answered to patient's satisfaction.    Co morbidities that complicate the patient evaluation  Past Medical  History:  Diagnosis Date   Arthritis    Cardiomyopathy (HCC)    CHF (congestive heart failure) (HCC)    Complication of anesthesia    Woke up during surgery   Coronary artery disease    Diabetes (HCC)    Gout    High cholesterol    Hypertension    Low testosterone 04/17/2021   Malignant neoplasm of thyroid gland (HCC) 04/17/2016   Pneumonia    Postsurgical hypothyroidism    thyroid cancer   Sleep apnea    Vitamin D insufficiency      Medicines Meds ordered this encounter  Medications   acetaminophen (TYLENOL) tablet 1,000 mg   ketorolac (TORADOL) 15 MG/ML injection 15 mg   oxyCODONE (Oxy IR/ROXICODONE) immediate release tablet 5 mg   DISCONTD: oxyCODONE (ROXICODONE) 5 MG immediate release tablet    Sig: Take 1 tablet (5 mg total) by mouth every 6 (six) hours as needed for up to 3 days for severe pain (pain score 7-10).    Dispense:  12 tablet    Refill:  0    I have reviewed the patients home medicines and have made adjustments as needed  Problem List / ED Course: Problem List Items Addressed This Visit   None Visit Diagnoses       Arthralgia of both knees    -  Primary     Body aches                       This note was created using dictation software, which may contain spelling or grammatical errors.    Loetta Rough, MD 06/08/23 [id number]

## 2023-06-02 NOTE — Discharge Instructions (Addendum)
Thank you for coming to [facility]. You were seen for joint and bone pain, fatigue, cough. It is likely that you have a viral illness. We did an exam, labs, and imaging, and these showed no acute findings.  Please follow up with your primary care provider on Friday. Take 1,000 mg of tylenol every 8 hours. You can also take oxycodone 5 mg to help you sleep at night for a few days. If your symptoms worsen please don't hesitate to return to the ED.    Do not hesitate to return to the ED or call 911 if you experience: -Worsening symptoms -Inability to walk -Lightheadedness, passing out -Fevers/chills -Anything else that concerns you  Please do not drive or operate heavy machinery while taking oxycodone.

## 2023-06-02 NOTE — ED Notes (Signed)
RN and Tech got patient up to ambulate.  Patient's effort was lacking and he made it to the door.  MD aware.

## 2023-06-04 ENCOUNTER — Ambulatory Visit: Payer: BC Managed Care – PPO | Admitting: Family Medicine

## 2023-06-04 ENCOUNTER — Encounter: Payer: Self-pay | Admitting: Family Medicine

## 2023-06-04 VITALS — BP 101/79 | HR 90 | Temp 97.7°F | Ht 70.0 in | Wt 274.8 lb

## 2023-06-04 DIAGNOSIS — M2550 Pain in unspecified joint: Secondary | ICD-10-CM | POA: Diagnosis not present

## 2023-06-04 DIAGNOSIS — C73 Malignant neoplasm of thyroid gland: Secondary | ICD-10-CM | POA: Diagnosis not present

## 2023-06-04 DIAGNOSIS — E1159 Type 2 diabetes mellitus with other circulatory complications: Secondary | ICD-10-CM | POA: Diagnosis not present

## 2023-06-04 DIAGNOSIS — R351 Nocturia: Secondary | ICD-10-CM | POA: Diagnosis not present

## 2023-06-04 DIAGNOSIS — D72828 Other elevated white blood cell count: Secondary | ICD-10-CM

## 2023-06-04 DIAGNOSIS — I152 Hypertension secondary to endocrine disorders: Secondary | ICD-10-CM

## 2023-06-04 DIAGNOSIS — E538 Deficiency of other specified B group vitamins: Secondary | ICD-10-CM

## 2023-06-04 DIAGNOSIS — N289 Disorder of kidney and ureter, unspecified: Secondary | ICD-10-CM

## 2023-06-04 DIAGNOSIS — I428 Other cardiomyopathies: Secondary | ICD-10-CM | POA: Diagnosis not present

## 2023-06-04 DIAGNOSIS — E890 Postprocedural hypothyroidism: Secondary | ICD-10-CM

## 2023-06-04 DIAGNOSIS — E559 Vitamin D deficiency, unspecified: Secondary | ICD-10-CM | POA: Diagnosis not present

## 2023-06-04 DIAGNOSIS — N201 Calculus of ureter: Secondary | ICD-10-CM | POA: Diagnosis not present

## 2023-06-04 DIAGNOSIS — N5201 Erectile dysfunction due to arterial insufficiency: Secondary | ICD-10-CM | POA: Diagnosis not present

## 2023-06-04 DIAGNOSIS — Z0000 Encounter for general adult medical examination without abnormal findings: Secondary | ICD-10-CM

## 2023-06-04 DIAGNOSIS — Z1211 Encounter for screening for malignant neoplasm of colon: Secondary | ICD-10-CM

## 2023-06-04 DIAGNOSIS — E1165 Type 2 diabetes mellitus with hyperglycemia: Secondary | ICD-10-CM

## 2023-06-04 DIAGNOSIS — M109 Gout, unspecified: Secondary | ICD-10-CM | POA: Diagnosis not present

## 2023-06-04 DIAGNOSIS — E291 Testicular hypofunction: Secondary | ICD-10-CM | POA: Diagnosis not present

## 2023-06-04 DIAGNOSIS — M47816 Spondylosis without myelopathy or radiculopathy, lumbar region: Secondary | ICD-10-CM | POA: Insufficient documentation

## 2023-06-04 DIAGNOSIS — M25551 Pain in right hip: Secondary | ICD-10-CM | POA: Diagnosis not present

## 2023-06-04 DIAGNOSIS — Z1159 Encounter for screening for other viral diseases: Secondary | ICD-10-CM

## 2023-06-04 DIAGNOSIS — R6889 Other general symptoms and signs: Secondary | ICD-10-CM | POA: Diagnosis not present

## 2023-06-04 DIAGNOSIS — Z130 Encounter for screening for diseases of the blood and blood-forming organs and certain disorders involving the immune mechanism: Secondary | ICD-10-CM

## 2023-06-04 DIAGNOSIS — D508 Other iron deficiency anemias: Secondary | ICD-10-CM

## 2023-06-04 DIAGNOSIS — R5382 Chronic fatigue, unspecified: Secondary | ICD-10-CM | POA: Diagnosis not present

## 2023-06-04 DIAGNOSIS — M47896 Other spondylosis, lumbar region: Secondary | ICD-10-CM | POA: Diagnosis not present

## 2023-06-04 LAB — BAYER DCA HB A1C WAIVED: HB A1C (BAYER DCA - WAIVED): 7.1 % — ABNORMAL HIGH (ref 4.8–5.6)

## 2023-06-04 LAB — LIPID PANEL

## 2023-06-04 MED ORDER — MELOXICAM 15 MG PO TABS: 15.0000 mg | ORAL_TABLET | Freq: Every day | ORAL | 1 refills | Status: DC

## 2023-06-04 NOTE — Progress Notes (Signed)
Complete physical exam  Patient: Tyler [name]   DOB: 09-15-1975   47 y.o. Male  MRN: [id number]  Subjective:    Chief Complaint  Patient presents with   Annual Exam    Tyler [name] is a 47 y.o. male who presents today for a complete physical exam. He reports consuming a general diet. The patient does not participate in regular exercise at present. He generally feels fairly well. He reports sleeping fairly well. He does have additional problems to discuss today.   Discussed the use of AI scribe software for clinical note transcription with the patient, who gave verbal consent to proceed.  History of Present Illness   The patient, with a history of diabetes and recent urological surgery, presents with concerns of excessive urination and dehydration, which he attributes to a new medication. He reports that this excessive urination led to a significant decrease in his mobility, to the point where he required a walker and was unable to walk for two days. However, he notes some improvement in his mobility recently.  In addition to these concerns, the patient also reports a recent episode of perineal infection, which he believes was a side effect of the new medication. He reports that the infection has since resolved with the application of a cream.  The patient's diabetes appears to be relatively well-controlled, with a recent HbA1c of 7.1-7.2. However, he reports not currently monitoring his blood glucose levels at home. He also reports a recent improvement in his cardiac function, with no current symptoms of heart failure.  The patient recently underwent urological surgery, and reports that he had stents removed three to four days post-surgery. He also underwent an ultrasound, which revealed some small residual stones that are expected to pass on their own.  The patient declines a colonoscopy but is open to the option of a Cologuard test. He also declines a flu shot due to a previous  adverse reaction. He reports no family history of colorectal cancer.  The patient's medication regimen includes meloxicam, which he reports needing a refill for. He also reports having been taken off amlodipine and a new medication due to concerns about his renal function and side effects, respectively. He has since resumed the amlodipine.  The patient's social history includes a lack of regular dental care, which he attributes to laziness in finding a new dentist after his previous one retired seven years ago. He reports being told that he has receding gums.       Most recent fall risk assessment:    06/04/2023    2:18 PM  Fall Risk   Falls in the past year? 0     Most recent depression screenings:    06/04/2023    2:18 PM 08/13/2022    3:40 PM  PHQ 2/9 Scores  PHQ - 2 Score 0 0  PHQ- 9 Score 0 0    Vision:Within last year and Dental: Current dental problems and No regular dental care   Patient Active Problem List   Diagnosis Date Noted   Lumbar spondylosis 06/04/2023   Diabetes mellitus without complication (HCC) 04/09/2023   Hypogonadism, male 04/09/2023   Mixed hyperlipidemia 04/09/2023   Hypertensive urgency 02/24/2023   Urolithiasis 02/24/2023   AKI (acute kidney injury) (HCC) 02/18/2023   Chronic combined systolic and diastolic CHF (congestive heart failure) (HCC) 02/18/2023   Ureteral stone 02/18/2023   S/P total left hip arthroplasty 09/08/2022   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) 08/21/2022  Arthritis of right wrist 04/30/2022   Pain of left hip joint 03/16/2022   Coronary artery disease involving native coronary artery of native heart without angina pectoris 06/30/2021   Low testosterone 04/17/2021   Chronic fatigue 12/26/2020   Mild sleep apnea 12/26/2020   Snoring 12/26/2020   Nonischemic cardiomyopathy (HCC) 07/02/2020   Morbid obesity (HCC) 05/28/2020   Vitamin D deficiency disease 02/14/2020   Type 2 diabetes mellitus with  hyperglycemia, without long-term current use of insulin (HCC) 09/29/2019   Pain in finger of left hand 02/10/2019   Polyarthralgia 07/28/2016   Malignant neoplasm of thyroid gland (HCC) 04/17/2016   Controlled gout 06/07/2015   Hypertension associated with diabetes (HCC) 03/13/2015   Postsurgical hypothyroidism 02/09/2015   Tobacco abuse 12/21/2012   Past Medical History:  Diagnosis Date   Arthritis    Cardiomyopathy (HCC)    CHF (congestive heart failure) (HCC)    Complication of anesthesia    Woke up during surgery   Coronary artery disease    Diabetes (HCC)    Gout    High cholesterol    Hypertension    Low testosterone 04/17/2021   Malignant neoplasm of thyroid gland (HCC) 04/17/2016   Pneumonia    Postsurgical hypothyroidism    thyroid cancer   Sleep apnea    Vitamin D insufficiency    Past Surgical History:  Procedure Laterality Date   CYSTOSCOPY W/ URETERAL STENT PLACEMENT Bilateral 02/25/2023   Procedure: CYSTOSCOPY WITHright  RETROGRADE PYELOGRAM/right URETERAL STENT PLACEMENT and left stone removal;  Surgeon: Alfredo Martinez, MD;  Location: [facility];  Service: Urology;  Laterality: Bilateral;   CYSTOSCOPY/URETEROSCOPY/HOLMIUM LASER/STENT PLACEMENT Right 03/30/2023   Procedure: CYSTOSCOPY, RIGHT RETROGRADE PYELOGRAM, RIGHT URETEROSCOPY, HOLMIUM LASER LITHOTRIPSY, AND RIGHT URETERAL STENT EXCHANGE;  Surgeon: Noel Christmas, MD;  Location: [facility];  Service: Urology;  Laterality: Right;  60 MINUTES   EYE SURGERY Left    cataract removal   FRACTURE SURGERY Left 06/23/1987   ankle   THYROIDECTOMY N/A 03/03/2013   Procedure: TOTAL THYROIDECTOMY;  Surgeon: Dalia Heading, MD;  Location: [facility];  Service: General;  Laterality: N/A;   TONSILLECTOMY     TOTAL HIP ARTHROPLASTY Right 07/28/2021   TOTAL HIP ARTHROPLASTY Left 09/08/2022   Procedure: TOTAL HIP ARTHROPLASTY ANTERIOR APPROACH;  Surgeon: Durene Romans, MD;  Location: [facility];  Service: Orthopedics;  Laterality:  Left;   Social History   Tobacco Use   Smoking status: Every Day    Current packs/day: 0.50    Average packs/day: 0.5 packs/day for 10.0 years (5.0 ttl pk-yrs)    Types: Cigarettes   Smokeless tobacco: Former  Building services engineer status: Never Used  Substance Use Topics   Alcohol use: Yes    Comment: occasional   Drug use: No   Social History   Socioeconomic History   Marital status: Single    Spouse name: Not on file   Number of children: Not on file   Years of education: Not on file   Highest education level: Not on file  Occupational History   Not on file  Tobacco Use   Smoking status: Every Day    Current packs/day: 0.50    Average packs/day: 0.5 packs/day for 10.0 years (5.0 ttl pk-yrs)    Types: Cigarettes   Smokeless tobacco: Former  Building services engineer status: Never Used  Substance and Sexual Activity   Alcohol use: Yes    Comment: occasional   Drug use: No  Sexual activity: Yes    Birth control/protection: None  Other Topics Concern   Not on file  Social History Narrative   Works outside   Social Drivers of [employer] Strain: Low Risk  (02/23/2023)   Received from [facility]   Overall Financial Resource Strain (CARDIA)    Difficulty of Paying Living Expenses: Not hard at all  Food Insecurity: No Food Insecurity (03/01/2023)   Hunger Vital Sign    Worried About Running Out of Food in the Last Year: Never true    Ran Out of Food in the Last Year: Never true  Transportation Needs: No Transportation Needs (03/01/2023)   PRAPARE - Administrator, Civil Service (Medical): No    Lack of Transportation (Non-Medical): No  Recent Concern: Transportation Needs - Unmet Transportation Needs (02/23/2023)   Received from [facility] - Transportation    Lack of Transportation (Medical): Yes    Lack of Transportation (Non-Medical): Yes  Physical Activity: Not on file  Stress: Not on file  Social Connections: Unknown  (10/24/2021)   Received from [facility]   Social Network    Social Network: Not on file  Intimate Partner Violence: Not At Risk (02/25/2023)   Humiliation, Afraid, Rape, and Kick questionnaire    Fear of Current or Ex-Partner: No    Emotionally Abused: No    Physically Abused: No    Sexually Abused: No   Family Status  Relation Name Status   Mother  Alive   Father  Alive  No partnership data on file   Family History  Problem Relation Age of Onset   Hypertension Father    Hyperlipidemia Father    Allergies  Allergen Reactions   Isosorbide Other (See Comments)    Lowers the blood pressure too much      Patient Care Team: Sonny Masters, FNP as PCP - General (Family Medicine) Michaelle Copas, MD as Referring Physician (Optometry) Ranee Gosselin, MD as Consulting Physician (Orthopedic Surgery) Roma Kayser, MD as Consulting Physician (Endocrinology) Wille Glaser, MD as Referring Physician (Cardiology) Clinton Gallant, RN as [facility] Management Sonia Side, NT as Technician   Outpatient Medications Prior to Visit  Medication Sig   Accu-Chek Softclix Lancets lancets Test BS morning, noon and at bedtime Dx E11.65   aspirin EC 81 MG tablet Take 81 mg by mouth daily.   Blood Glucose Monitoring Suppl DEVI 1 each by Does not apply route in the morning, at noon, and at bedtime. May substitute to any manufacturer covered by patient's insurance.   carvedilol (COREG) 12.5 MG tablet Take 1 tablet (12.5 mg total) by mouth 2 (two) times daily with a meal.   celecoxib (CELEBREX) 200 MG capsule Take 200 mg by mouth 2 (two) times daily.   colchicine 0.6 MG tablet TAKE 2 TABLETS AT ONSET, THEN 1 TABLET 2 HOURS LATER- A MAXIMUM OF 3 TABLETS IN 24 HOURS (Patient taking differently: Take 0.6-1.2 mg by mouth See admin instructions. TAKE 1.2 mg (2 TABLETS) by mouth AT ONSET OF GOUT FLARE, THEN 0.6 mg (1 TABLET) 2 HOURS LATER AS NEEDED- A  MAXIMUM OF 1.8 mg (3 TABLETS IN 24 HOURS))   glucose blood (ACCU-CHEK GUIDE) test strip Test BS morning, noon and at bedtime Dx E11.65   levothyroxine (SYNTHROID) 200 MCG tablet Take 200 mcg by mouth daily before breakfast.   magnesium oxide (MAG-OX) 400 (240 Mg) MG tablet  Take 400 mg by mouth at bedtime.   metFORMIN (GLUCOPHAGE) 1000 MG tablet TAKE 1 TABLET (1,000 MG TOTAL) BY MOUTH TWICE A DAY WITH FOOD   methocarbamol (ROBAXIN) 500 MG tablet Take 1 tablet (500 mg total) by mouth every 6 (six) hours as needed for muscle spasms.   mupirocin ointment (BACTROBAN) 2 % Apply 1 Application topically 2 (two) times daily as needed (as directed for irritation- affected areas).   nystatin (MYCOSTATIN/NYSTOP) powder Apply 1 Application topically 2 (two) times daily as needed (as directed for irritation- affected areas).   nystatin cream (MYCOSTATIN) Apply 1 Application topically 2 (two) times daily as needed (as directed for irritation- affected areas).   omega-3 acid ethyl esters (LOVAZA) 1 g capsule TAKE 2 CAPSULES BY MOUTH TWICE A DAY   pravastatin (PRAVACHOL) 40 MG tablet TAKE 1 TABLET BY MOUTH EVERY DAY   predniSONE (STERAPRED UNI-PAK 21 TAB) 5 MG (21) TBPK tablet Take 5 mg by mouth daily.   sacubitril-valsartan (ENTRESTO) 97-103 MG Take 1 tablet by mouth 2 (two) times daily.   TURMERIC CURCUMIN PO Take 1,000 mg by mouth 2 (two) times daily.   TYLENOL 500 MG tablet Take 1,000 mg by mouth every 6 (six) hours as needed for mild pain or headache.   [DISCONTINUED] meloxicam (MOBIC) 15 MG tablet TAKE 1 TABLET (15 MG TOTAL) BY MOUTH DAILY.   amLODipine (NORVASC) 5 MG tablet Take 5 mg by mouth daily. (Patient not taking: Reported on 06/04/2023)   empagliflozin (JARDIANCE) 10 MG TABS tablet Take 1 tablet (10 mg total) by mouth daily before breakfast. (Patient not taking: Reported on 06/04/2023)   [DISCONTINUED] hydrALAZINE (APRESOLINE) 25 MG tablet Take 25 mg by mouth 3 (three) times daily.   [DISCONTINUED]  oxyCODONE (ROXICODONE) 5 MG immediate release tablet Take 1 tablet (5 mg total) by mouth every 6 (six) hours as needed for up to 3 days for severe pain (pain score 7-10).   [DISCONTINUED] oxyCODONE-acetaminophen (PERCOCET) 5-325 MG tablet Take 1 tablet by mouth every 4 (four) hours as needed for severe pain.   [DISCONTINUED] tamsulosin (FLOMAX) 0.4 MG CAPS capsule Take 1 capsule (0.4 mg total) by mouth daily.   No facility-administered medications prior to visit.   ROS per HPI, otherwise unremarkable.       Objective:     BP 101/79   Pulse 90   Temp 97.7 F (36.5 C)   Ht 5\' 10"  (1.778 m)   Wt 274 lb 12.8 oz (124.6 kg)   SpO2 94%   BMI 39.43 kg/m  BP Readings from Last 3 Encounters:  06/04/23 101/79  06/02/23 126/89  04/09/23 (!) 134/90   Wt Readings from Last 3 Encounters:  06/04/23 274 lb 12.8 oz (124.6 kg)  06/02/23 283 lb 15.2 oz (128.8 kg)  04/09/23 284 lb (128.8 kg)   SpO2 Readings from Last 3 Encounters:  06/04/23 94%  06/02/23 95%  03/30/23 97%      Physical Exam Vitals and nursing note reviewed.  Constitutional:      General: He is not in acute distress.    Appearance: Normal appearance. He is well-developed and well-groomed. He is morbidly obese. He is ill-appearing. He is not toxic-appearing or diaphoretic.  HENT:     Head: Normocephalic and atraumatic.     Jaw: There is normal jaw occlusion.     Right Ear: Hearing, tympanic membrane, ear canal and external ear normal.     Left Ear: Hearing, tympanic membrane, ear canal and external ear normal.  Nose: Nose normal.     Mouth/Throat:     Lips: Pink.     Mouth: Mucous membranes are moist.     Pharynx: Oropharynx is clear. Uvula midline.  Eyes:     General: Lids are normal.     Extraocular Movements: Extraocular movements intact.     Conjunctiva/sclera: Conjunctivae normal.     Pupils: Pupils are equal, round, and reactive to light.  Neck:     Thyroid: No thyroid mass, thyromegaly or thyroid  tenderness.     Vascular: No carotid bruit or JVD.     Trachea: Trachea and phonation normal.  Cardiovascular:     Rate and Rhythm: Normal rate and regular rhythm.     Chest Wall: PMI is not displaced.     Pulses: Normal pulses.     Heart sounds: Murmur heard.     Systolic murmur is present with a grade of 1/6.     No friction rub. No gallop.  Pulmonary:     Effort: Pulmonary effort is normal. No respiratory distress.     Breath sounds: Normal breath sounds. No wheezing.  Abdominal:     General: Bowel sounds are normal. There is no distension or abdominal bruit.     Palpations: Abdomen is soft. There is no hepatomegaly or splenomegaly.     Tenderness: There is no abdominal tenderness. There is no right CVA tenderness or left CVA tenderness.     Hernia: No hernia is present.  Musculoskeletal:        General: Normal range of motion.     Cervical back: Normal range of motion and neck supple.     Right lower leg: Edema (trace) present.     Left lower leg: Edema (trace) present.  Lymphadenopathy:     Cervical: No cervical adenopathy.  Skin:    General: Skin is warm and dry.     Capillary Refill: Capillary refill takes less than 2 seconds.     Coloration: Skin is not cyanotic, jaundiced or pale.     Findings: No rash.  Neurological:     General: No focal deficit present.     Mental Status: He is alert and oriented to person, place, and time.     Sensory: Sensation is intact.     Motor: Motor function is intact.     Coordination: Coordination is intact.     Gait: Gait abnormal (antalgic, using walker).     Deep Tendon Reflexes: Reflexes are normal and symmetric.  Psychiatric:        Attention and Perception: Attention and perception normal.        Mood and Affect: Mood and affect normal.        Speech: Speech normal.        Behavior: Behavior normal. Behavior is cooperative.        Thought Content: Thought content normal.        Cognition and Memory: Cognition and memory normal.         Judgment: Judgment normal.      Last CBC Lab Results  Component Value Date   WBC 12.7 (H) 06/02/2023   HGB 14.6 06/02/2023   HCT 44.1 06/02/2023   MCV 89.5 06/02/2023   MCH 29.6 06/02/2023   RDW 12.1 06/02/2023   PLT 278 06/02/2023   Last metabolic panel Lab Results  Component Value Date   GLUCOSE 235 (H) 06/02/2023   NA 136 06/02/2023   K 3.9 06/02/2023   CL 105 06/02/2023  CO2 20 (L) 06/02/2023   BUN 12 06/02/2023   CREATININE 1.00 06/02/2023   GFRNONAA >60 06/02/2023   CALCIUM 9.2 06/02/2023   PHOS 3.9 02/20/2023   PROT 7.7 06/02/2023   ALBUMIN 3.8 06/02/2023   LABGLOB 3.0 03/02/2023   AGRATIO 1.4 09/25/2022   BILITOT 0.8 06/02/2023   ALKPHOS 74 06/02/2023   AST 25 06/02/2023   ALT 36 06/02/2023   ANIONGAP 11 06/02/2023   Last lipids Lab Results  Component Value Date   CHOL 178 03/02/2023   HDL 29 (L) 03/02/2023   LDLCALC 119 (H) 03/02/2023   TRIG 167 (H) 03/02/2023   CHOLHDL 6.1 (H) 03/02/2023   Last hemoglobin A1c Lab Results  Component Value Date   HGBA1C 7.4 (H) 03/02/2023   Last thyroid functions Lab Results  Component Value Date   TSH 7.002 (H) 02/24/2023   T4TOTAL 9.5 03/27/2022   Last vitamin D Lab Results  Component Value Date   VD25OH 32.2 03/27/2022      Assessment & Plan:    Routine Health Maintenance and Physical Exam  Immunization History  Administered Date(s) Administered   Influenza-Unspecified 05/20/2000   Janssen (J&J) SARS-COV-2 Vaccination 04/03/2020   Td 04/08/1999   Tdap 05/05/2020    Health Maintenance  Topic Date Due   Diabetic kidney evaluation - Urine ACR  04/16/2022   COVID-19 Vaccine (2 - Janssen risk series) 06/20/2023 (Originally 05/01/2020)   INFLUENZA VACCINE  09/20/2023 (Originally 01/21/2023)   Colonoscopy  06/03/2024 (Originally 03/14/2023)   Hepatitis C Screening  06/03/2024 (Originally 03/13/1996)   HEMOGLOBIN A1C  08/30/2023   OPHTHALMOLOGY EXAM  05/12/2024   Diabetic kidney evaluation -  eGFR measurement  06/01/2024   DTaP/Tdap/Td (3 - Td or Tdap) 05/05/2030   HIV Screening  Completed   HPV VACCINES  Aged Out   FOOT EXAM  Discontinued    Discussed health benefits of physical activity, and encouraged him to engage in regular exercise appropriate for his age and condition.  Problem List Items Addressed This Visit       Cardiovascular and Mediastinum   Hypertension associated with diabetes (HCC)   Relevant Orders   Anemia Profile B   CMP14+EGFR   Thyroid Panel With TSH   Microalbumin / creatinine urine ratio   Nonischemic cardiomyopathy (HCC)   Relevant Orders   Anemia Profile B   CMP14+EGFR   Lipid panel   Thyroid Panel With TSH     Endocrine   Postsurgical hypothyroidism   Relevant Orders   Thyroid Panel With TSH   Malignant neoplasm of thyroid gland (HCC)   Relevant Medications   predniSONE (STERAPRED UNI-PAK 21 TAB) 5 MG (21) TBPK tablet   Other Relevant Orders   Thyroid Panel With TSH   Type 2 diabetes mellitus with hyperglycemia, without long-term current use of insulin (HCC)   Relevant Orders   Anemia Profile B   CMP14+EGFR   Lipid panel   Thyroid Panel With TSH   Bayer DCA Hb A1c Waived   Microalbumin / creatinine urine ratio     Other   Polyarthralgia   Relevant Medications   meloxicam (MOBIC) 15 MG tablet   Vitamin D deficiency disease   Relevant Orders   VITAMIN D 25 Hydroxy (Vit-D Deficiency, Fractures)   Other Visit Diagnoses       Annual physical exam    -  Primary   Relevant Orders   Anemia Profile B   CMP14+EGFR   Lipid panel   Thyroid Panel With TSH  Bayer DCA Hb A1c Waived   Hepatitis C antibody   Microalbumin / creatinine urine ratio     Screening for deficiency anemia       Relevant Orders   Anemia Profile B     Screening for colorectal cancer       Relevant Orders   Cologuard     Encounter for hepatitis C screening test for low risk patient       Relevant Orders   Hepatitis C antibody     Assessment and  Plan    Dehydration secondary to medication Dehydration likely due to Jardiance, causing excessive urination, inability to walk, and hypotension. Symptoms improved after discontinuing Jardiance three days ago. Discussed risks including dehydration and joint pain. Patient prefers to discontinue Jardiance. - Discontinue Jardiance - Monitor hydration status - Reassess need for Jardiance at next endocrinology visit  Diabetes Mellitus Type 2 Diabetes managed with medication. Last A1c was 7.1-7.2 in August. Patient not regularly checking blood sugars. Discussed importance of regular monitoring and potential complications of uncontrolled diabetes. - Order A1c test - Encourage regular blood sugar monitoring - Follow up with endocrinology next year  Chronic Kidney Disease Kidney function recently exacerbated by dehydration but has improved. Discussed need for regular renal function monitoring and caution with nephrotoxic medications like meloxicam. - Order urine microalbumin test - Monitor renal function regularly - Caution with nephrotoxic medications  Hypertension Blood pressure low, possibly due to dehydration. Not currently taking amlodipine. Discussed monitoring blood pressure and reassessing need for antihypertensive medication at next visit. - Monitor blood pressure - Reassess need for antihypertensive medication at next visit  Heart Failure Improved ejection fraction (45-50%). No current symptoms. Discussed importance of monitoring for heart failure symptoms and regular cardiology follow-up. - Follow up with cardiology this month or next - Monitor for heart failure symptoms  Urolithiasis Recent kidney stone surgery in September. Follow-up ultrasound showed small residual stones expected to pass. Discussed potential symptoms and need for follow-up if symptoms worsen. - Monitor for kidney stone symptoms - Follow up with urology as needed  General Health Maintenance Patient declined  flu shot due to previous reaction. Agreed to Cologuard for colorectal cancer screening. No recent dental visits; advised regular dental check-ups due to diabetes-related gum disease risk. Discussed Cologuard's high sensitivity and specificity, and need for follow-up colonoscopy if positive. - Order Cologuard - Encourage regular dental visits - Update blood work today  Follow-up - Follow up with endocrinology next year - Follow up with cardiology this month or next - Monitor renal function regularly - Reassess need for Jardiance and antihypertensive medication at next visit.       Return in about 6 months (around 12/03/2023), or if symptoms worsen or fail to improve, for chronic follow up.     Kari Baars, FNP

## 2023-06-05 LAB — ANEMIA PROFILE B
Basophils Absolute: 0 10*3/uL (ref 0.0–0.2)
Basos: 0 %
EOS (ABSOLUTE): 0 10*3/uL (ref 0.0–0.4)
Eos: 0 %
Ferritin: 216 ng/mL (ref 30–400)
Folate: 5 ng/mL (ref >3.0)
Hematocrit: 44.3 % (ref 37.5–51.0)
Hemoglobin: 14.8 g/dL (ref 13.0–17.7)
Immature Grans (Abs): 0.1 10*3/uL (ref 0.0–0.1)
Immature Granulocytes: 1 %
Iron Saturation: 6 % — CL (ref 15–55)
Iron: 21 ug/dL — ABNORMAL LOW (ref 38–169)
Lymphocytes Absolute: 2.6 10*3/uL (ref 0.7–3.1)
Lymphs: 18 %
MCH: 30.6 pg (ref 26.6–33.0)
MCHC: 33.4 g/dL (ref 31.5–35.7)
MCV: 92 fL (ref 79–97)
Monocytes Absolute: 0.8 10*3/uL (ref 0.1–0.9)
Monocytes: 5 %
Neutrophils Absolute: 11.3 10*3/uL — ABNORMAL HIGH (ref 1.4–7.0)
Neutrophils: 76 %
Platelets: 299 10*3/uL (ref 150–450)
RBC: 4.84 x10E6/uL (ref 4.14–5.80)
RDW: 12.3 % (ref 11.6–15.4)
Retic Ct Pct: 1.3 % (ref 0.6–2.6)
Total Iron Binding Capacity: 340 ug/dL (ref 250–450)
UIBC: 319 ug/dL (ref 111–343)
Vitamin B-12: 145 pg/mL — ABNORMAL LOW (ref 232–1245)
WBC: 14.9 10*3/uL — ABNORMAL HIGH (ref 3.4–10.8)

## 2023-06-05 LAB — CMP14+EGFR
ALT: 25 IU/L (ref 0–44)
AST: 17 IU/L (ref 0–40)
Albumin: 4.3 g/dL (ref 4.1–5.1)
Alkaline Phosphatase: 96 IU/L (ref 44–121)
BUN/Creatinine Ratio: 13 (ref 9–20)
BUN: 15 mg/dL (ref 6–24)
Bilirubin Total: 0.2 mg/dL (ref 0.0–1.2)
CO2: 20 mmol/L (ref 20–29)
Calcium: 10 mg/dL (ref 8.7–10.2)
Chloride: 101 mmol/L (ref 96–106)
Creatinine, Ser: 1.16 mg/dL (ref 0.76–1.27)
Globulin, Total: 3.3 g/dL (ref 1.5–4.5)
Glucose: 253 mg/dL — ABNORMAL HIGH (ref 70–99)
Potassium: 4.2 mmol/L (ref 3.5–5.2)
Sodium: 140 mmol/L (ref 134–144)
Total Protein: 7.6 g/dL (ref 6.0–8.5)
eGFR: 79 mL/min/{1.73_m2} (ref >59)

## 2023-06-05 LAB — HEPATITIS C ANTIBODY

## 2023-06-05 LAB — VITAMIN D 25 HYDROXY (VIT D DEFICIENCY, FRACTURES): Vit D, 25-Hydroxy: 24.6 ng/mL — ABNORMAL LOW (ref 30.0–100.0)

## 2023-06-05 LAB — LIPID PANEL
Cholesterol, Total: 187 mg/dL (ref 100–199)
HDL: 33 mg/dL — ABNORMAL LOW (ref >39)
LDL CALC COMMENT:: 5.7 ratio — ABNORMAL HIGH (ref 0.0–5.0)
LDL Chol Calc (NIH): 103 mg/dL — ABNORMAL HIGH (ref 0–99)
Triglycerides: 298 mg/dL — ABNORMAL HIGH (ref 0–149)
VLDL Cholesterol Cal: 51 mg/dL — ABNORMAL HIGH (ref 5–40)

## 2023-06-05 LAB — THYROID PANEL WITH TSH
Free Thyroxine Index: 3.1 (ref 1.2–4.9)
T3 Uptake Ratio: 30 % (ref 24–39)
T4, Total: 10.2 ug/dL (ref 4.5–12.0)
TSH: 2.17 u[IU]/mL (ref 0.450–4.500)

## 2023-06-06 LAB — MICROALBUMIN / CREATININE URINE RATIO
Creatinine, Urine: 65 mg/dL
Microalb/Creat Ratio: 144 mg/g{creat} — ABNORMAL HIGH (ref 0–29)
Microalbumin, Urine: 93.7 ug/mL

## 2023-06-07 MED ORDER — IRON (FERROUS SULFATE) 325 (65 FE) MG PO TABS: 325.0000 mg | ORAL_TABLET | Freq: Every day | ORAL | 6 refills | Status: DC

## 2023-06-07 NOTE — Addendum Note (Signed)
Addended by: Sonny Masters on: 06/07/2023 07:40 PM   Modules accepted: Orders

## 2023-06-18 ENCOUNTER — Other Ambulatory Visit: Payer: Self-pay | Admitting: "Endocrinology

## 2023-06-18 DIAGNOSIS — C73 Malignant neoplasm of thyroid gland: Secondary | ICD-10-CM

## 2023-06-20 ENCOUNTER — Encounter (HOSPITAL_COMMUNITY): Payer: Self-pay

## 2023-06-20 ENCOUNTER — Emergency Department (HOSPITAL_COMMUNITY)
Admission: EM | Admit: 2023-06-20 | Discharge: 2023-06-20 | Disposition: A | Discharge status: Home / Self Care | Payer: BC Managed Care – PPO | Attending: Emergency Medicine | Admitting: Emergency Medicine

## 2023-06-20 ENCOUNTER — Emergency Department (HOSPITAL_COMMUNITY): Payer: BC Managed Care – PPO

## 2023-06-20 ENCOUNTER — Other Ambulatory Visit: Payer: Self-pay

## 2023-06-20 DIAGNOSIS — Z7984 Long term (current) use of oral hypoglycemic drugs: Secondary | ICD-10-CM | POA: Insufficient documentation

## 2023-06-20 DIAGNOSIS — Z79899 Other long term (current) drug therapy: Secondary | ICD-10-CM | POA: Insufficient documentation

## 2023-06-20 DIAGNOSIS — M7989 Other specified soft tissue disorders: Secondary | ICD-10-CM | POA: Diagnosis not present

## 2023-06-20 DIAGNOSIS — E039 Hypothyroidism, unspecified: Secondary | ICD-10-CM | POA: Diagnosis not present

## 2023-06-20 DIAGNOSIS — I110 Hypertensive heart disease with heart failure: Secondary | ICD-10-CM | POA: Insufficient documentation

## 2023-06-20 DIAGNOSIS — M2550 Pain in unspecified joint: Secondary | ICD-10-CM

## 2023-06-20 DIAGNOSIS — B356 Tinea cruris: Secondary | ICD-10-CM | POA: Diagnosis not present

## 2023-06-20 DIAGNOSIS — Z7982 Long term (current) use of aspirin: Secondary | ICD-10-CM | POA: Diagnosis not present

## 2023-06-20 DIAGNOSIS — I509 Heart failure, unspecified: Secondary | ICD-10-CM | POA: Insufficient documentation

## 2023-06-20 DIAGNOSIS — R609 Edema, unspecified: Secondary | ICD-10-CM | POA: Diagnosis not present

## 2023-06-20 DIAGNOSIS — M25561 Pain in right knee: Secondary | ICD-10-CM | POA: Diagnosis not present

## 2023-06-20 DIAGNOSIS — M1711 Unilateral primary osteoarthritis, right knee: Secondary | ICD-10-CM | POA: Insufficient documentation

## 2023-06-20 DIAGNOSIS — E119 Type 2 diabetes mellitus without complications: Secondary | ICD-10-CM | POA: Diagnosis not present

## 2023-06-20 LAB — BASIC METABOLIC PANEL
Anion gap: 12 (ref 5–15)
BUN: 10 mg/dL (ref 6–20)
CO2: 21 mmol/L — ABNORMAL LOW (ref 22–32)
Calcium: 9.3 mg/dL (ref 8.9–10.3)
Chloride: 104 mmol/L (ref 98–111)
Creatinine, Ser: 0.9 mg/dL (ref 0.61–1.24)
GFR, Estimated: >60 mL/min (ref >60)
Glucose, Bld: 183 mg/dL — ABNORMAL HIGH (ref 70–99)
Potassium: 3.7 mmol/L (ref 3.5–5.1)
Sodium: 137 mmol/L (ref 135–145)

## 2023-06-20 LAB — URINALYSIS, ROUTINE W REFLEX MICROSCOPIC
Bacteria, UA: NONE SEEN
Bilirubin Urine: NEGATIVE
Glucose, UA: NEGATIVE mg/dL
Hgb urine dipstick: NEGATIVE
Ketones, ur: NEGATIVE mg/dL
Leukocytes,Ua: NEGATIVE
Nitrite: NEGATIVE
Protein, ur: 100 mg/dL — AB
Specific Gravity, Urine: 1.018 (ref 1.005–1.030)
pH: 5 (ref 5.0–8.0)

## 2023-06-20 LAB — CBC
HCT: 40.8 % (ref 39.0–52.0)
Hemoglobin: 13.8 g/dL (ref 13.0–17.0)
MCH: 30.1 pg (ref 26.0–34.0)
MCHC: 33.8 g/dL (ref 30.0–36.0)
MCV: 89.1 fL (ref 80.0–100.0)
Platelets: 289 10*3/uL (ref 150–400)
RBC: 4.58 MIL/uL (ref 4.22–5.81)
RDW: 12.2 % (ref 11.5–15.5)
WBC: 11.8 10*3/uL — ABNORMAL HIGH (ref 4.0–10.5)
nRBC: 0 % (ref 0.0–0.2)

## 2023-06-20 MED ORDER — OXYCODONE-ACETAMINOPHEN 5-325 MG PO TABS
1.0000 | ORAL_TABLET | Freq: Once | ORAL | Status: AC
  Administered 2023-06-20: 1 via ORAL
  Filled 2023-06-20: qty 1

## 2023-06-20 MED ORDER — CARVEDILOL 12.5 MG PO TABS
12.5000 mg | ORAL_TABLET | Freq: Once | ORAL | Status: AC
  Administered 2023-06-20: 12.5 mg via ORAL
  Filled 2023-06-20: qty 1

## 2023-06-20 MED ORDER — IBUPROFEN 200 MG PO TABS
600.0000 mg | ORAL_TABLET | Freq: Once | ORAL | Status: AC
  Administered 2023-06-20: 600 mg via ORAL
  Filled 2023-06-20: qty 3

## 2023-06-20 MED ORDER — SACUBITRIL-VALSARTAN 97-103 MG PO TABS
1.0000 | ORAL_TABLET | Freq: Once | ORAL | Status: AC
  Administered 2023-06-20: 1 via ORAL
  Filled 2023-06-20: qty 1

## 2023-06-20 MED ORDER — OXYCODONE HCL 5 MG PO TABS: 5.0000 mg | ORAL_TABLET | Freq: Four times a day (QID) | ORAL | 0 refills | Status: DC | PRN

## 2023-06-20 MED ORDER — CLOTRIMAZOLE 1 % EX CREA: TOPICAL_CREAM | CUTANEOUS | 0 refills | Status: DC

## 2023-06-20 NOTE — ED Provider Notes (Signed)
[name] EMERGENCY DEPARTMENT AT [facility] Provider Note   CSN: [id number] Arrival date & time: 06/20/23  1417     History  Chief Complaint  Patient presents with   Leg Swelling    Tyler [name] is a 47 y.o. male.  Patient is a 47 year old male with a past medical history of hypertension, diabetes, CHF, hypothyroidism that presented to the emergency department with polyarthralgia's.  The patient states for the last several weeks he has had pain in multiple of his joints.  He states that initially he was having pain in his left wrist and left elbow that have somewhat improved and is now having some pain in his his left hand.  He states that he has also had pain in his right knee and right ankle.  He states that his knee pain is the most severe at this point and is having difficulty walking due to her pain.  He denies any recent fever or chills.  Denies any rash.  That he did have a diarrheal illness about the same time that the joint pain started.  Denies any known STD exposures.  The patient was seen in the emergency department initially and was started on steroids that did somewhat help however now the pain is worsened.  He has seen his primary doctor and orthopedist but has not yet been able to find the cause.  The history is provided by the patient.       Home Medications Prior to Admission medications   Medication Sig Start Date End Date Taking? Authorizing Provider  clotrimazole (LOTRIMIN) 1 % cream Apply to affected area 2 times daily 06/20/23  Yes Theresia Lo, [place] K, DO  oxyCODONE (ROXICODONE) 5 MG immediate release tablet Take 1 tablet (5 mg total) by mouth every 6 (six) hours as needed for severe pain (pain score 7-10). 06/20/23  Yes Elayne Snare K, DO  Accu-Chek Softclix Lancets lancets Test BS morning, noon and at bedtime Dx E11.65 03/29/23   Sonny Masters, FNP  amLODipine (NORVASC) 5 MG tablet Take 5 mg by mouth daily. Patient not taking: Reported  on 06/04/2023    [provider]  aspirin EC 81 MG tablet Take 81 mg by mouth daily. 12/07/22   [provider]  Blood Glucose Monitoring Suppl DEVI 1 each by Does not apply route in the morning, at noon, and at bedtime. May substitute to any manufacturer covered by patient's insurance. 03/03/23   Sonny Masters, FNP  carvedilol (COREG) 12.5 MG tablet Take 1 tablet (12.5 mg total) by mouth 2 (two) times daily with a meal. 02/20/23   Almon Hercules, MD  celecoxib (CELEBREX) 200 MG capsule Take 200 mg by mouth 2 (two) times daily.    [provider]  colchicine 0.6 MG tablet TAKE 2 TABLETS AT ONSET, THEN 1 TABLET 2 HOURS LATER- A MAXIMUM OF 3 TABLETS IN 24 HOURS Patient taking differently: Take 0.6-1.2 mg by mouth See admin instructions. TAKE 1.2 mg (2 TABLETS) by mouth AT ONSET OF GOUT FLARE, THEN 0.6 mg (1 TABLET) 2 HOURS LATER AS NEEDED- A MAXIMUM OF 1.8 mg (3 TABLETS IN 24 HOURS) 05/21/20   Nida, Denman George, MD  empagliflozin (JARDIANCE) 10 MG TABS tablet Take 1 tablet (10 mg total) by mouth daily before breakfast. Patient not taking: Reported on 06/04/2023 04/09/23   Roma Kayser, MD  glucose blood (ACCU-CHEK GUIDE) test strip Test BS morning, noon and at bedtime Dx E11.65 03/29/23   Rakes,  Doralee Albino, FNP  Iron, Ferrous Sulfate, 325 (65 Fe) MG TABS Take 325 mg by mouth daily. 06/07/23   Sonny Masters, FNP  levothyroxine (SYNTHROID) 200 MCG tablet Take 200 mcg by mouth daily before breakfast.    [provider]  magnesium oxide (MAG-OX) 400 (240 Mg) MG tablet Take 400 mg by mouth at bedtime.    [provider]  meloxicam (MOBIC) 15 MG tablet Take 1 tablet (15 mg total) by mouth daily. 06/04/23   Sonny Masters, FNP  metFORMIN (GLUCOPHAGE) 1000 MG tablet TAKE 1 TABLET (1,000 MG TOTAL) BY MOUTH TWICE A DAY WITH FOOD 03/29/23   Rakes, Doralee Albino, FNP  methocarbamol (ROBAXIN) 500 MG tablet Take 1 tablet (500 mg total) by mouth every 6 (six) hours as  needed for muscle spasms. 09/08/22   Cassandria Anger, PA-C  mupirocin ointment (BACTROBAN) 2 % Apply 1 Application topically 2 (two) times daily as needed (as directed for irritation- affected areas).    [provider]  nystatin (MYCOSTATIN/NYSTOP) powder Apply 1 Application topically 2 (two) times daily as needed (as directed for irritation- affected areas).    [provider]  nystatin cream (MYCOSTATIN) Apply 1 Application topically 2 (two) times daily as needed (as directed for irritation- affected areas).    [provider]  omega-3 acid ethyl esters (LOVAZA) 1 g capsule TAKE 2 CAPSULES BY MOUTH TWICE A DAY 03/29/23   Sonny Masters, FNP  pravastatin (PRAVACHOL) 40 MG tablet TAKE 1 TABLET BY MOUTH EVERY DAY 06/23/22   Daryll Drown, NP  predniSONE (STERAPRED UNI-PAK 21 TAB) 5 MG (21) TBPK tablet Take 5 mg by mouth daily.    [provider]  sacubitril-valsartan (ENTRESTO) 97-103 MG Take 1 tablet by mouth 2 (two) times daily.    [provider]  TURMERIC CURCUMIN PO Take 1,000 mg by mouth 2 (two) times daily.    [provider]  TYLENOL 500 MG tablet Take 1,000 mg by mouth every 6 (six) hours as needed for mild pain or headache.    [provider]      Allergies    Isosorbide    Review of Systems   Review of Systems  Physical Exam Updated Vital Signs BP (!) 148/110   Pulse 91   Temp 97.9 F (36.6 C)   Resp 20   Ht 5\' 10"  (1.778 m)   Wt 124.6 kg   SpO2 98%   BMI 39.43 kg/m  Physical Exam Vitals and nursing note reviewed.  Constitutional:      General: He is not in acute distress.    Appearance: Normal appearance.  HENT:     Head: Normocephalic and atraumatic.     Nose: Nose normal.     Mouth/Throat:     Mouth: Mucous membranes are moist.     Pharynx: Oropharynx is clear.  Eyes:     Extraocular Movements: Extraocular movements intact.     Conjunctiva/sclera: Conjunctivae normal.  Cardiovascular:     Rate  and Rhythm: Normal rate and regular rhythm.     Heart sounds: Normal heart sounds.  Pulmonary:     Effort: Pulmonary effort is normal.     Breath sounds: Normal breath sounds.  Abdominal:     General: Abdomen is flat.     Palpations: Abdomen is soft.     Tenderness: There is no abdominal tenderness.  Musculoskeletal:        General: Normal range of motion.  Cervical back: Normal range of motion.     Comments: No tenderness to palpation of right upper extremity or left lower extremity Mild tenderness to palpation of left wrist and left second MCP joint and left elbow, no significant swelling, erythema or warmth, ROM intact though patient does have increased pain with elbow extension Tenderness to palpation of right medial aspect of knee, no palpable knee joint effusion, increased pain with right knee extension no knee ROM is intact, no laxity, no significant tenderness to palpation of right ankle, no swelling, erythema or warmth  Skin:    General: Skin is warm and dry.  Neurological:     General: No focal deficit present.     Mental Status: He is alert and oriented to person, place, and time.     Sensory: No sensory deficit.     Motor: No weakness.  Psychiatric:        Mood and Affect: Mood normal.        Behavior: Behavior normal.     ED Results / Procedures / Treatments   Labs (all labs ordered are listed, but only abnormal results are displayed) Labs Reviewed  CBC - Abnormal; Notable for the following components:      Result Value   WBC 11.8 (*)    All other components within normal limits  BASIC METABOLIC PANEL - Abnormal; Notable for the following components:   CO2 21 (*)    Glucose, Bld 183 (*)    All other components within normal limits  URINALYSIS, ROUTINE W REFLEX MICROSCOPIC - Abnormal; Notable for the following components:   Protein, ur 100 (*)    All other components within normal limits    EKG None  Radiology DG Knee 2 Views Right Result Date:  06/20/2023 CLINICAL DATA:  Right knee pain and swelling for 2 weeks EXAM: RIGHT KNEE - 1-2 VIEW COMPARISON:  None Available. FINDINGS: Frontal and lateral views of the right knee are obtained. No fracture, subluxation, or dislocation. Mild 3 compartmental osteoarthritis. No joint effusion. Mild anterior subcutaneous edema. IMPRESSION: 1. No acute fracture. 2. Mild 3 compartmental osteoarthritis. 3. Mild anterior subcutaneous edema. Electronically Signed   By: Sharlet Salina M.D.   On: 06/20/2023 22:02    Procedures Procedures    Medications Ordered in ED Medications  carvedilol (COREG) tablet 12.5 mg (has no administration in time range)  sacubitril-valsartan (ENTRESTO) 97-103 mg per tablet (has no administration in time range)  ibuprofen (ADVIL) tablet 600 mg (600 mg Oral Given 06/20/23 2200)  oxyCODONE-acetaminophen (PERCOCET/ROXICET) 5-325 MG per tablet 1 tablet (1 tablet Oral Given 06/20/23 2200)    ED Course/ Medical Decision Making/ A&P Clinical Course as of 06/20/23 2255  Sun Jun 20, 2023  2215 Tricompartmental osteoarthritis in the R knee. [VK]  2248 Patient's BP elevated to 200's, patient is due for PM BP meds which will be ordered. Will be given knee brace for support. Patient's wife also report concern of rash the the groin that appears consistent with fungal infection. Will be given antifungal. Recommended to follow up with ortho regarding osteoarthritis and rheumatology for polyarthralgias. [VK]    Clinical Course User Index [VK] Rexford Maus, DO                                 Medical Decision Making This patient presents to the ED with chief complaint(s) of polyarthralgias with pertinent past medical history of hypertension, diabetes, CHF,  hypothyroidism which further complicates the presenting complaint. The complaint involves an extensive differential diagnosis and also carries with it a high risk of complications and morbidity.    The differential diagnosis  includes rheumatologic disorder, postinfectious arthritis, osteoarthritis, no signs of septic arthritis on exam  Additional history obtained: Additional history obtained from N/A Records reviewed Primary Care Documents and outpatient orthopedic records, recent ED records  ED Course and Reassessment: On patient's arrival he is hemodynamically stable in no acute distress.  Initially had labs performed in triage that showed a mild leukocytosis that is downtrending and otherwise within normal range.  With significant pain in his right knee that has been worse than the rest of his joints, will have right knee x-ray.  Did have right hip x-rays done by his orthopedist last week that were stable.  Suspect likely a polyarthralgia of an unknown etiology.  Independent labs interpretation:  The following labs were independently interpreted: mild leukocytosis that is improving and otherwise within normal range  Independent visualization of imaging: - I independently visualized the following imaging with scope of interpretation limited to determining acute life threatening conditions related to emergency care: Right knee x-ray, which revealed osteoarthritis  Consultation: - Consulted or discussed management/test interpretation w/ external professional: N/A  Consideration for admission or further workup: Patient has no emergent conditions requiring admission or further work-up at this time and is stable for discharge home with primary care, orthopedics and rheumatology follow-up  Social Determinants of health: N/A    Amount and/or Complexity of Data Reviewed Labs: ordered. Radiology: ordered.  Risk OTC drugs. Prescription drug management.          Final Clinical Impression(s) / ED Diagnoses Final diagnoses:  Polyarthralgia  Osteoarthritis of right knee, unspecified osteoarthritis type  Tinea cruris    Rx / DC Orders ED Discharge Orders          Ordered    oxyCODONE (ROXICODONE) 5  MG immediate release tablet  Every 6 hours PRN        06/20/23 2252    clotrimazole (LOTRIMIN) 1 % cream        06/20/23 2252              Elayne Snare K, DO 06/20/23 2255

## 2023-06-20 NOTE — Discharge Instructions (Addendum)
You were seen in the emergency department for your joint pains.  Your labs were normal with improved kidney function.  Your x-ray of your knee does show that you have arthritis in your knee however with you having pain in multiple of your joints it is possible that this could be caused by a rheumatologic disorder or post infectious syndrome.  You should continue to take your home meloxicam as prescribed, you can take Tylenol every 6 hours as needed and I given you oxycodone for breakthrough pain.  You can follow-up with your primary doctor, orthopedics and rheumatology for further workup and evaluation.  Your rash in your groin appeared consistent with a fungal rash night given you an antifungal cream to help treat this.  You should return to the emergency department if you develop fevers, you have significant redness or warmth to your joints and are unable to bend them or put weight on them or if you have any other new or concerning symptoms.

## 2023-06-20 NOTE — ED Triage Notes (Signed)
Patient reports right leg pain and swelling x 2 weeks. Was on steroids, but now it is worse. Also reports left hand swelling x 3 days. Also reports "infection" in left groin x 1 week. Also recently told his kidneys are not functioning right.

## 2023-06-22 DIAGNOSIS — M25561 Pain in right knee: Secondary | ICD-10-CM | POA: Diagnosis not present

## 2023-06-23 DIAGNOSIS — B379 Candidiasis, unspecified: Secondary | ICD-10-CM | POA: Diagnosis not present

## 2023-06-23 DIAGNOSIS — B372 Candidiasis of skin and nail: Secondary | ICD-10-CM | POA: Diagnosis not present

## 2023-06-23 DIAGNOSIS — L03314 Cellulitis of groin: Secondary | ICD-10-CM | POA: Diagnosis not present

## 2023-06-23 DIAGNOSIS — M1711 Unilateral primary osteoarthritis, right knee: Secondary | ICD-10-CM | POA: Diagnosis not present

## 2023-06-23 DIAGNOSIS — Z79899 Other long term (current) drug therapy: Secondary | ICD-10-CM | POA: Diagnosis not present

## 2023-06-23 DIAGNOSIS — M25561 Pain in right knee: Secondary | ICD-10-CM | POA: Diagnosis not present

## 2023-06-23 DIAGNOSIS — L03311 Cellulitis of abdominal wall: Secondary | ICD-10-CM | POA: Diagnosis not present

## 2023-06-23 DIAGNOSIS — L304 Erythema intertrigo: Secondary | ICD-10-CM | POA: Diagnosis not present

## 2023-06-23 DIAGNOSIS — L309 Dermatitis, unspecified: Secondary | ICD-10-CM | POA: Diagnosis not present

## 2023-06-23 DIAGNOSIS — M7989 Other specified soft tissue disorders: Secondary | ICD-10-CM | POA: Diagnosis not present

## 2023-06-23 DIAGNOSIS — Z888 Allergy status to other drugs, medicaments and biological substances status: Secondary | ICD-10-CM | POA: Diagnosis not present

## 2023-06-23 DIAGNOSIS — Z720 Tobacco use: Secondary | ICD-10-CM | POA: Diagnosis not present

## 2023-06-23 DIAGNOSIS — Z79890 Hormone replacement therapy (postmenopausal): Secondary | ICD-10-CM | POA: Diagnosis not present

## 2023-06-23 DIAGNOSIS — G8929 Other chronic pain: Secondary | ICD-10-CM | POA: Diagnosis not present

## 2023-06-24 DIAGNOSIS — G8929 Other chronic pain: Secondary | ICD-10-CM | POA: Diagnosis not present

## 2023-06-24 DIAGNOSIS — M1711 Unilateral primary osteoarthritis, right knee: Secondary | ICD-10-CM | POA: Diagnosis not present

## 2023-06-24 DIAGNOSIS — L03314 Cellulitis of groin: Secondary | ICD-10-CM | POA: Diagnosis not present

## 2023-06-24 DIAGNOSIS — M25561 Pain in right knee: Secondary | ICD-10-CM | POA: Diagnosis not present

## 2023-06-24 DIAGNOSIS — M7989 Other specified soft tissue disorders: Secondary | ICD-10-CM | POA: Diagnosis not present

## 2023-06-25 DIAGNOSIS — M25561 Pain in right knee: Secondary | ICD-10-CM | POA: Diagnosis not present

## 2023-06-28 ENCOUNTER — Other Ambulatory Visit: Payer: Self-pay | Admitting: "Endocrinology

## 2023-06-28 ENCOUNTER — Other Ambulatory Visit: Payer: Self-pay | Admitting: Family Medicine

## 2023-06-28 DIAGNOSIS — M25561 Pain in right knee: Secondary | ICD-10-CM | POA: Diagnosis not present

## 2023-06-28 DIAGNOSIS — E119 Type 2 diabetes mellitus without complications: Secondary | ICD-10-CM

## 2023-06-28 DIAGNOSIS — C73 Malignant neoplasm of thyroid gland: Secondary | ICD-10-CM

## 2023-07-07 NOTE — Progress Notes (Signed)
[name] [address], [place] [address]   Clinic Day:  07/08/2023  Referring physician: Sonny Masters, [name]  Patient Care Team: Tyler Masters, [name] as PCP - General (Family Medicine) Tyler Copas, [name] as Referring Physician (Optometry) Tyler Gosselin, [name] as Consulting Physician (Orthopedic Surgery) Tyler Kayser, [name] as Consulting Physician (Endocrinology) Tyler Glaser, [name] as Referring Physician (Cardiology) Tyler Gallant, RN as [facility] Care Management Tyler [name], NT as Technician   ASSESSMENT & PLAN:   Assessment:  1.  B12 deficiency: - Patient seen at the request of Tyler Herald, [name]. - Labs on 06/04/2023: Hb-14.8, ferritin-216, percent saturation 6, B12 145, folic acid normal.  TIBC was 340.  Creatinine was 1.16. - He had a history of kidney failure in September from renal stones. - Denies BRBPR/melena.  He is not on oral iron supplements.  Denies any tingling or numbness in the extremities.  No B symptoms. - Ambulation is limited from right knee problem.  Reportedly had thyroid carcinoma 8 years ago which was resected and follows up with Tyler [name]. - He also has intermittent leukocytosis, but had recent skin infection in the groin region and required antibiotics.  2. Social/Family History: -Lives at home with wife. Currently does not work due to torn meniscus of right knee. Tobacco use of 5 packs per week since age 62.  -Mother had lupus. Maternal grandmother had thyroid cancer. Father died of cancer, type unknown.   Plan:  1.  B12 deficiency: - We will give [name] B12 1 mg injection today. - He will start B12 1 mg tablet daily. - RTC 3 months for follow-up.  Will check his CBC, repeat B12 and methylmalonic acid levels.  Will also check ferritin and iron panel at that time.  2.  Neutrophilic leukocytosis: - He had elevated white count and neutrophil count in December.  At that time he had infection in the  skin of the groin region.  He was treated with antibiotics. - Will follow-up on CBC in 3 months and do additional testing if needed.   Orders Placed This Encounter  Procedures   Vitamin B12    Standing Status:   Future    Expected Date:   07/08/2023    Expiration Date:   07/07/2024   Methylmalonic acid, serum    Standing Status:   Future    Expected Date:   07/08/2023    Expiration Date:   07/07/2024   Ferritin    Standing Status:   Future    Expected Date:   07/08/2023    Expiration Date:   07/07/2024   Iron and TIBC ([facility] ONLY)    Standing Status:   Future    Expected Date:   07/08/2023    Expiration Date:   07/07/2024   CBC with Differential    Standing Status:   Future    Expected Date:   07/08/2023    Expiration Date:   07/07/2024      Tyler [name] for Tyler Massed, [name].,have documented all relevant documentation on the behalf of Tyler Massed, [name],as directed by  Tyler Massed, [name] while in the presence of Tyler Massed, [name].   I, Tyler [name], have reviewed the above documentation for accuracy and completeness, and I agree with the above.   Tyler Massed, [name]   1/16/20255:00 PM  CHIEF COMPLAINT/PURPOSE OF CONSULT:   Diagnosis: B12 deficiency and leukocytosis  Current  Therapy: B12 supplements  HISTORY OF PRESENT ILLNESS:   Tyler [name] is a 48 y.o. male presenting to clinic today for evaluation of leukocytosis, as well as low iron and Vitamin B12 at the request of Tyler [name].  Tyler [name] has a history of DM type 2, heart failure, and urolithiasis (recent rempval of kidney stone on 02/25/23 with right ureteral stent placement, then stent exchange on 03/30/23). Stents have since been removed and kidney function was normal when checked 1 week ago. Of note he has arthralgias, particularly in the right knee from osteoarthritis. He has a torn meniscus in the right knee and has an appointment with surgery  today.  Patient was seen by his PCP for an annual physical exam on 06/04/23. Blood work was done at that time. He was found to have abnormal CBC from 06/24/23 with elevated WBC at 12.3, elevated MPV at 10.6, and elevated neutrophils at 8.3. His Anemia Profile B from 06/04/23 found low iron at 21, low iron saturation at 6, and low Vitamin B 12 at 145. Vitamin D levels were low at 24.6 on 06/04/23.   Today, he states that he is doing well overall. His appetite level is at 90%. His energy level is at 75%. He is accompanied by his wife. He notes he had kidney stones in September 2024 and after that temporarily discontinued medication, which he attributes to the abnormal lab work. He reports a groin skin infection with sores from Jardiance, treated with antibiotics, in late 2024. He notes joint swellings and has a history of gout. He has been taking B complex vitamins after lab work was done. He has not started oral iron supplements. He denies any hematuria, BRBPR, melena, or peripheral neuropathy. He denies any unintentional weight loss, night sweats, or fevers.   Patient has a history of thyroid cancer with a thyroidectomy 8 years ago. He is taking Synthroid and is seen by Tyler [name] for follow-up of this.   PAST MEDICAL HISTORY:   Past Medical History: Past Medical History:  Diagnosis Date   Arthritis    Cardiomyopathy (HCC)    CHF (congestive heart failure) (HCC)    Complication of anesthesia    Woke up during surgery   Coronary artery disease    Diabetes (HCC)    Gout    High cholesterol    Hypertension    Low testosterone 04/17/2021   Malignant neoplasm of thyroid gland (HCC) 04/17/2016   Pneumonia    Postsurgical hypothyroidism    thyroid cancer   Sleep apnea    Vitamin D insufficiency     Surgical History: Past Surgical History:  Procedure Laterality Date   CYSTOSCOPY W/ URETERAL STENT PLACEMENT Bilateral 02/25/2023   Procedure: CYSTOSCOPY WITHright  RETROGRADE PYELOGRAM/right  URETERAL STENT PLACEMENT and left stone removal;  Surgeon: Tyler Martinez, [name];  Location: [facility];  Service: Urology;  Laterality: Bilateral;   CYSTOSCOPY/URETEROSCOPY/HOLMIUM LASER/STENT PLACEMENT Right 03/30/2023   Procedure: CYSTOSCOPY, RIGHT RETROGRADE PYELOGRAM, RIGHT URETEROSCOPY, HOLMIUM LASER LITHOTRIPSY, AND RIGHT URETERAL STENT EXCHANGE;  Surgeon: Noel Christmas, [name];  Location: [facility];  Service: Urology;  Laterality: Right;  60 MINUTES   EYE SURGERY Left    cataract removal   FRACTURE SURGERY Left 06/23/1987   ankle   THYROIDECTOMY N/A 03/03/2013   Procedure: TOTAL THYROIDECTOMY;  Surgeon: Dalia Heading, [name];  Location: [facility];  Service: General;  Laterality: N/A;   TONSILLECTOMY     TOTAL HIP ARTHROPLASTY Right 07/28/2021   TOTAL HIP ARTHROPLASTY Left 09/08/2022  Procedure: TOTAL HIP ARTHROPLASTY ANTERIOR APPROACH;  Surgeon: Durene Romans, [name];  Location: [facility];  Service: Orthopedics;  Laterality: Left;    Social History: Social History   Socioeconomic History   Marital status: Single    Spouse name: Not on file   Number of children: Not on file   Years of education: Not on file   Highest education level: Not on file  Occupational History   Not on file  Tobacco Use   Smoking status: Every Day    Current packs/day: 0.50    Average packs/day: 0.5 packs/day for 10.0 years (5.0 ttl pk-yrs)    Types: Cigarettes   Smokeless tobacco: Former  Building services engineer status: Never Used  Substance and Sexual Activity   Alcohol use: Yes    Comment: occasional   Drug use: No   Sexual activity: Yes    Birth control/protection: None  Other Topics Concern   Not on file  Social History Narrative   Works outside   Social Drivers of [employer] Strain: Low Risk  (02/23/2023)   Received from [facility]   Overall Financial Resource Strain (CARDIA)    Difficulty of Paying Living Expenses: Not hard at all  Food Insecurity: No Food Insecurity (03/01/2023)    Hunger Vital Sign    Worried About Running Out of Food in the Last Year: Never true    Ran Out of Food in the Last Year: Never true  Transportation Needs: No Transportation Needs (03/01/2023)   PRAPARE - Administrator, Civil Service (Medical): No    Lack of Transportation (Non-Medical): No  Recent Concern: Transportation Needs - Unmet Transportation Needs (02/23/2023)   Received from [facility] - Transportation    Lack of Transportation (Medical): Yes    Lack of Transportation (Non-Medical): Yes  Physical Activity: Not on file  Stress: Not on file  Social Connections: Unknown (10/24/2021)   [facility]   Social Network    Social Network: Not on file  Intimate Partner Violence: Not At Risk (02/25/2023)   Humiliation, Afraid, Rape, and Kick questionnaire    Fear of Current or Ex-Partner: No    Emotionally Abused: No    Physically Abused: No    Sexually Abused: No    Family History: Family History  Problem Relation Age of Onset   Hypertension Father    Hyperlipidemia Father     Current Medications:  Current Outpatient Medications:    Accu-Chek Softclix Lancets lancets, Test BS morning, noon and at bedtime Dx E11.65, Disp: 300 each, Rfl: 3   aspirin EC 81 MG tablet, Take 81 mg by mouth daily., Disp: , Rfl:    Blood Glucose Monitoring Suppl DEVI, 1 each by Does not apply route in the morning, at noon, and at bedtime. May substitute to any manufacturer covered by patient's insurance., Disp: 1 each, Rfl: 0   carvedilol (COREG) 12.5 MG tablet, Take 1 tablet (12.5 mg total) by mouth 2 (two) times daily with a meal., Disp: 180 tablet, Rfl: 1   celecoxib (CELEBREX) 200 MG capsule, Take 200 mg by mouth 2 (two) times daily., Disp: , Rfl:    colchicine 0.6 MG tablet, TAKE 2 TABLETS AT ONSET, THEN 1 TABLET 2 HOURS LATER- A MAXIMUM OF 3 TABLETS IN 24 HOURS (Patient taking differently: Take 0.6-1.2 mg by mouth See admin instructions. TAKE 1.2 mg  (2 TABLETS) by mouth AT ONSET OF GOUT FLARE, THEN  0.6 mg (1 TABLET) 2 HOURS LATER AS NEEDED- A MAXIMUM OF 1.8 mg (3 TABLETS IN 24 HOURS)), Disp: 270 tablet, Rfl: 0   cyanocobalamin (VITAMIN B12) 1000 MCG tablet, Take 1 tablet (1,000 mcg total) by mouth daily., Disp: 30 tablet, Rfl: 6   glucose blood (ACCU-CHEK GUIDE) test strip, Test BS morning, noon and at bedtime Dx E11.65, Disp: 300 strip, Rfl: 3   Iron, Ferrous Sulfate, 325 (65 Fe) MG TABS, Take 325 mg by mouth daily., Disp: 30 tablet, Rfl: 6   levothyroxine (SYNTHROID) 200 MCG tablet, TAKE 1 TABLET BY MOUTH EVERY DAY, Disp: 90 tablet, Rfl: 1   magnesium oxide (MAG-OX) 400 (240 Mg) MG tablet, Take 400 mg by mouth at bedtime., Disp: , Rfl:    meloxicam (MOBIC) 15 MG tablet, Take 1 tablet (15 mg total) by mouth daily., Disp: 90 tablet, Rfl: 1   metFORMIN (GLUCOPHAGE) 1000 MG tablet, TAKE 1 TABLET (1,000 MG TOTAL) BY MOUTH TWICE A DAY WITH FOOD, Disp: 180 tablet, Rfl: 1   methocarbamol (ROBAXIN) 500 MG tablet, Take 1 tablet (500 mg total) by mouth every 6 (six) hours as needed for muscle spasms., Disp: 40 tablet, Rfl: 2   mupirocin ointment (BACTROBAN) 2 %, Apply 1 Application topically 2 (two) times daily as needed (as directed for irritation- affected areas)., Disp: , Rfl:    naproxen (NAPROSYN) 500 MG tablet, Take by mouth., Disp: , Rfl:    nystatin (MYCOSTATIN/NYSTOP) powder, Apply 1 Application topically 2 (two) times daily as needed (as directed for irritation- affected areas)., Disp: , Rfl:    omega-3 acid ethyl esters (LOVAZA) 1 g capsule, TAKE 2 CAPSULES BY MOUTH TWICE A DAY, Disp: 360 capsule, Rfl: 0   oxyCODONE (ROXICODONE) 5 MG immediate release tablet, Take 1 tablet (5 mg total) by mouth every 6 (six) hours as needed for severe pain (pain score 7-10)., Disp: 12 tablet, Rfl: 0   pravastatin (PRAVACHOL) 40 MG tablet, TAKE 1 TABLET BY MOUTH EVERY DAY, Disp: 90 tablet, Rfl: 0   sacubitril-valsartan (ENTRESTO) 97-103 MG, Take 1 tablet by  mouth 2 (two) times daily., Disp: , Rfl:    TURMERIC CURCUMIN PO, Take 1,000 mg by mouth 2 (two) times daily., Disp: , Rfl:    TYLENOL 500 MG tablet, Take 1,000 mg by mouth every 6 (six) hours as needed for mild pain or headache., Disp: , Rfl:    oxyCODONE-acetaminophen (PERCOCET/ROXICET) 5-325 MG tablet, Take 1 tablet by mouth every 4 (four) hours as needed., Disp: , Rfl:    Allergies: Allergies  Allergen Reactions   Isosorbide Other (See Comments)    Lowers the blood pressure too much    REVIEW OF SYSTEMS:   Review of Systems  Constitutional:  Negative for chills, fatigue and fever.  HENT:   Negative for lump/mass, mouth sores, nosebleeds, sore throat and trouble swallowing.   Eyes:  Negative for eye problems.  Respiratory:  Negative for cough and shortness of breath.   Cardiovascular:  Negative for chest pain, leg swelling and palpitations.  Gastrointestinal:  Negative for abdominal pain, constipation, diarrhea, nausea and vomiting.  Genitourinary:  Negative for bladder incontinence, difficulty urinating, dysuria, frequency, hematuria and nocturia.   Musculoskeletal:  Negative for arthralgias, back pain, flank pain, myalgias and neck pain.       +knee pain, 6/10 severity  Skin:  Negative for itching and rash.  Neurological:  Negative for dizziness, headaches and numbness.  Hematological:  Does not bruise/bleed easily.  Psychiatric/Behavioral:  Negative for depression,  sleep disturbance and suicidal ideas. The patient is not nervous/anxious.   All other systems reviewed and are negative.    VITALS:   Blood pressure (!) 154/125, pulse 89, temperature (!) 97.2 F (36.2 C), temperature source Tympanic, resp. rate 20, weight 272 lb 7.8 oz (123.6 kg), SpO2 97%.  Wt Readings from Last 3 Encounters:  07/08/23 272 lb 7.8 oz (123.6 kg)  06/20/23 274 lb 12.8 oz (124.6 kg)  06/04/23 274 lb 12.8 oz (124.6 kg)    Body mass index is 39.1 kg/m.   PHYSICAL EXAM:   Physical  Exam Vitals and nursing note reviewed. Exam conducted with a chaperone present.  Constitutional:      Appearance: Normal appearance.  Cardiovascular:     Rate and Rhythm: Normal rate and regular rhythm.     Pulses: Normal pulses.     Heart sounds: Normal heart sounds.  Pulmonary:     Effort: Pulmonary effort is normal.     Breath sounds: Normal breath sounds.  Abdominal:     Palpations: Abdomen is soft. There is no hepatomegaly, splenomegaly or mass.     Tenderness: There is no abdominal tenderness.  Musculoskeletal:     Right lower leg: No edema.     Left lower leg: No edema.  Lymphadenopathy:     Cervical: No cervical adenopathy.     Right cervical: No superficial, deep or posterior cervical adenopathy.    Left cervical: No superficial, deep or posterior cervical adenopathy.     Upper Body:     Right upper body: No supraclavicular or axillary adenopathy.     Left upper body: No supraclavicular or axillary adenopathy.  Neurological:     General: No focal deficit present.     Mental Status: He is alert and oriented to person, place, and time.  Psychiatric:        Mood and Affect: Mood normal.        Behavior: Behavior normal.     LABS:   CBC    Component Value Date/Time   WBC 11.8 (H) 06/20/2023 1513   RBC 4.58 06/20/2023 1513   HGB 13.8 06/20/2023 1513   HGB 14.8 06/04/2023 1442   HCT 40.8 06/20/2023 1513   HCT 44.3 06/04/2023 1442   PLT 289 06/20/2023 1513   PLT 299 06/04/2023 1442   MCV 89.1 06/20/2023 1513   MCV 92 06/04/2023 1442   MCH 30.1 06/20/2023 1513   MCHC 33.8 06/20/2023 1513   RDW 12.2 06/20/2023 1513   RDW 12.3 06/04/2023 1442   LYMPHSABS 2.6 06/04/2023 1442   MONOABS 0.7 06/02/2023 1019   EOSABS 0.0 06/04/2023 1442   BASOSABS 0.0 06/04/2023 1442    CMP    Component Value Date/Time   NA 137 06/20/2023 1513   NA 140 06/04/2023 1442   K 3.7 06/20/2023 1513   CL 104 06/20/2023 1513   CO2 21 (L) 06/20/2023 1513   GLUCOSE 183 (H) 06/20/2023  1513   BUN 10 06/20/2023 1513   BUN 15 06/04/2023 1442   CREATININE 0.90 06/20/2023 1513   CREATININE 0.99 02/12/2020 0933   CALCIUM 9.3 06/20/2023 1513   PROT 7.6 06/04/2023 1442   ALBUMIN 4.3 06/04/2023 1442   AST 17 06/04/2023 1442   ALT 25 06/04/2023 1442   ALKPHOS 96 06/04/2023 1442   BILITOT 0.2 06/04/2023 1442   GFRNONAA >60 06/20/2023 1513   GFRNONAA 94 02/12/2020 0933   GFRAA 109 02/12/2020 0933    No results found for: "CEA1", "CEA" /  No results found for: "CEA1", "CEA" Lab Results  Component Value Date   PSA1 0.4 04/23/2022   No results found for: "VWU981" No results found for: "CAN125"  No results found for: "TOTALPROTELP", "ALBUMINELP", "A1GS", "A2GS", "BETS", "BETA2SER", "GAMS", "MSPIKE", "SPEI" Lab Results  Component Value Date   TIBC 340 06/04/2023   FERRITIN 216 06/04/2023   FERRITIN 200 04/23/2022   IRONPCTSAT 6 (LL) 06/04/2023   No results found for: "LDH"   STUDIES:   DG Knee 2 Views Right Result Date: 06/20/2023 CLINICAL DATA:  Right knee pain and swelling for 2 weeks EXAM: RIGHT KNEE - 1-2 VIEW COMPARISON:  None Available. FINDINGS: Frontal and lateral views of the right knee are obtained. No fracture, subluxation, or dislocation. Mild 3 compartmental osteoarthritis. No joint effusion. Mild anterior subcutaneous edema. IMPRESSION: 1. No acute fracture. 2. Mild 3 compartmental osteoarthritis. 3. Mild anterior subcutaneous edema. Electronically Signed   By: Sharlet Salina M.D.   On: 06/20/2023 22:02

## 2023-07-08 ENCOUNTER — Inpatient Hospital Stay: Payer: BC Managed Care – PPO | Attending: Hematology | Admitting: Hematology

## 2023-07-08 ENCOUNTER — Inpatient Hospital Stay: Payer: BC Managed Care – PPO

## 2023-07-08 VITALS — BP 154/125 | HR 89 | Temp 97.2°F | Resp 20 | Wt 272.5 lb

## 2023-07-08 DIAGNOSIS — E538 Deficiency of other specified B group vitamins: Secondary | ICD-10-CM | POA: Insufficient documentation

## 2023-07-08 DIAGNOSIS — D538 Other specified nutritional anemias: Secondary | ICD-10-CM

## 2023-07-08 DIAGNOSIS — F17210 Nicotine dependence, cigarettes, uncomplicated: Secondary | ICD-10-CM | POA: Diagnosis not present

## 2023-07-08 DIAGNOSIS — D72828 Other elevated white blood cell count: Secondary | ICD-10-CM

## 2023-07-08 DIAGNOSIS — D508 Other iron deficiency anemias: Secondary | ICD-10-CM

## 2023-07-08 DIAGNOSIS — M1711 Unilateral primary osteoarthritis, right knee: Secondary | ICD-10-CM | POA: Diagnosis not present

## 2023-07-08 DIAGNOSIS — Z808 Family history of malignant neoplasm of other organs or systems: Secondary | ICD-10-CM | POA: Diagnosis not present

## 2023-07-08 DIAGNOSIS — Z809 Family history of malignant neoplasm, unspecified: Secondary | ICD-10-CM

## 2023-07-08 DIAGNOSIS — S83271A Complex tear of lateral meniscus, current injury, right knee, initial encounter: Secondary | ICD-10-CM | POA: Diagnosis not present

## 2023-07-08 MED ORDER — VITAMIN B-12 1000 MCG PO TABS: 1000.0000 ug | ORAL_TABLET | Freq: Every day | ORAL | 6 refills | Status: AC

## 2023-07-08 MED ORDER — CYANOCOBALAMIN 1000 MCG/ML IJ SOLN
1000.0000 ug | Freq: Once | INTRAMUSCULAR | Status: AC
  Administered 2023-07-08: 1000 ug via INTRAMUSCULAR
  Filled 2023-07-08: qty 1

## 2023-07-08 NOTE — Patient Instructions (Signed)
You were seen and examined today by Dr. Ellin Saba. Dr. Ellin Saba is a hematologist, meaning that he specializes in blood abnormalities. Dr. Ellin Saba discussed your past medical history, family history of cancers/blood conditions and the events that led to you being here today.  You were referred to Dr. Ellin Saba due to iron deficiency and B12 deficiency.  We will give you a B12 injection today. Then you should take over the counter B12 1 mg (1000 mcg) daily.  We will see you back in 3 months. We will repeat lab work prior to this visit.   Follow-up as scheduled.

## 2023-07-08 NOTE — Patient Instructions (Signed)
[facility] [place] - A DEPT OF [facility]  Discharge Instructions: Thank you for choosing [facility] to provide your oncology and hematology care.  If you have a lab appointment with the Cancer Center - please note that after April 8th, 2024, all labs will be drawn in the cancer center.  You do not have to check in or register with the main entrance as you have in the past but will complete your check-in in the cancer center.  Wear comfortable clothing and clothing appropriate for easy access to any Portacath or PICC line.   We strive to give you quality time with your provider. You may need to reschedule your appointment if you arrive late (15 or more minutes).  Arriving late affects you and other patients whose appointments are after yours.  Also, if you miss three or more appointments without notifying the office, you may be dismissed from the clinic at the provider's discretion.      For prescription refill requests, have your pharmacy contact our office and allow 72 hours for refills to be completed.    Today you received the following Vitamin B12 injection.      To help prevent nausea and vomiting after your treatment, we encourage you to take your nausea medication as directed.  BELOW ARE SYMPTOMS THAT SHOULD BE REPORTED IMMEDIATELY: *FEVER GREATER THAN 100.4 F (38 C) OR HIGHER *CHILLS OR SWEATING *NAUSEA AND VOMITING THAT IS NOT CONTROLLED WITH YOUR NAUSEA MEDICATION *UNUSUAL SHORTNESS OF BREATH *UNUSUAL BRUISING OR BLEEDING *URINARY PROBLEMS (pain or burning when urinating, or frequent urination) *BOWEL PROBLEMS (unusual diarrhea, constipation, pain near the anus) TENDERNESS IN MOUTH AND THROAT WITH OR WITHOUT PRESENCE OF ULCERS (sore throat, sores in mouth, or a toothache) UNUSUAL RASH, SWELLING OR PAIN  UNUSUAL VAGINAL DISCHARGE OR ITCHING   Items with * indicate a potential emergency and should be followed up as soon as possible or go  to the Emergency Department if any problems should occur.  Please show the CHEMOTHERAPY ALERT CARD or IMMUNOTHERAPY ALERT CARD at check-in to the Emergency Department and triage nurse.  Should you have questions after your visit or need to cancel or reschedule your appointment, please contact [facility] [place] - A DEPT OF [facility] [phone]  and follow the prompts.  Office hours are 8:00 a.m. to 4:30 p.m. Monday - Friday. Please note that voicemails left after 4:00 p.m. may not be returned until the following business day.  We are closed weekends and major holidays. You have access to a nurse at all times for urgent questions. Please call the main number to the clinic [phone] and follow the prompts.  For any non-urgent questions, you may also contact your provider using MyChart. We now offer e-Visits for anyone 63 and older to request care online for non-urgent symptoms. For details visit [email or web address].   Also download the MyChart app! Go to the app store, search "MyChart", open the app, select [place], and log in with your MyChart username and password.

## 2023-07-08 NOTE — Progress Notes (Signed)
Tyler [name] presents today for injection per the provider's orders.  Vitamin B12 injection administration without incident; injection site WNL; see MAR for injection details.  Patient tolerated procedure well and without incident.  No questions or complaints noted at this time. Patient discharged ambulatory and in stable condition with family member.

## 2023-07-12 DIAGNOSIS — M25561 Pain in right knee: Secondary | ICD-10-CM | POA: Diagnosis not present

## 2023-07-15 ENCOUNTER — Other Ambulatory Visit: Payer: Self-pay | Admitting: "Endocrinology

## 2023-07-15 ENCOUNTER — Ambulatory Visit: Payer: BC Managed Care – PPO

## 2023-07-15 DIAGNOSIS — M25561 Pain in right knee: Secondary | ICD-10-CM | POA: Diagnosis not present

## 2023-07-16 ENCOUNTER — Inpatient Hospital Stay: Payer: BC Managed Care – PPO

## 2023-07-16 ENCOUNTER — Inpatient Hospital Stay: Payer: BC Managed Care – PPO | Admitting: Oncology

## 2023-07-19 DIAGNOSIS — M25561 Pain in right knee: Secondary | ICD-10-CM | POA: Diagnosis not present

## 2023-07-21 DIAGNOSIS — M25561 Pain in right knee: Secondary | ICD-10-CM | POA: Diagnosis not present

## 2023-07-22 LAB — COLOGUARD

## 2023-07-27 DIAGNOSIS — M25561 Pain in right knee: Secondary | ICD-10-CM | POA: Diagnosis not present

## 2023-07-29 DIAGNOSIS — M1711 Unilateral primary osteoarthritis, right knee: Secondary | ICD-10-CM | POA: Diagnosis not present

## 2023-07-29 DIAGNOSIS — S83271A Complex tear of lateral meniscus, current injury, right knee, initial encounter: Secondary | ICD-10-CM | POA: Diagnosis not present

## 2023-07-30 DIAGNOSIS — E559 Vitamin D deficiency, unspecified: Secondary | ICD-10-CM | POA: Diagnosis not present

## 2023-07-30 DIAGNOSIS — E291 Testicular hypofunction: Secondary | ICD-10-CM | POA: Diagnosis not present

## 2023-07-30 DIAGNOSIS — E890 Postprocedural hypothyroidism: Secondary | ICD-10-CM | POA: Diagnosis not present

## 2023-07-30 DIAGNOSIS — C73 Malignant neoplasm of thyroid gland: Secondary | ICD-10-CM | POA: Diagnosis not present

## 2023-07-30 DIAGNOSIS — E119 Type 2 diabetes mellitus without complications: Secondary | ICD-10-CM | POA: Diagnosis not present

## 2023-07-30 DIAGNOSIS — E782 Mixed hyperlipidemia: Secondary | ICD-10-CM | POA: Diagnosis not present

## 2023-08-04 ENCOUNTER — Other Ambulatory Visit: Payer: Self-pay | Admitting: Family Medicine

## 2023-08-04 DIAGNOSIS — E781 Pure hyperglyceridemia: Secondary | ICD-10-CM

## 2023-08-09 LAB — LIPID PANEL
Chol/HDL Ratio: 4.9 {ratio} (ref 0.0–5.0)
Cholesterol, Total: 137 mg/dL (ref 100–199)
HDL: 28 mg/dL — ABNORMAL LOW (ref >39)
LDL Chol Calc (NIH): 83 mg/dL (ref 0–99)
Triglycerides: 148 mg/dL (ref 0–149)
VLDL Cholesterol Cal: 26 mg/dL (ref 5–40)

## 2023-08-09 LAB — COMPREHENSIVE METABOLIC PANEL
ALT: 34 [IU]/L (ref 0–44)
AST: 18 [IU]/L (ref 0–40)
Albumin: 4 g/dL — ABNORMAL LOW (ref 4.1–5.1)
Alkaline Phosphatase: 87 [IU]/L (ref 44–121)
BUN/Creatinine Ratio: 8 — ABNORMAL LOW (ref 9–20)
BUN: 7 mg/dL (ref 6–24)
Bilirubin Total: 0.5 mg/dL (ref 0.0–1.2)
CO2: 21 mmol/L (ref 20–29)
Calcium: 9.3 mg/dL (ref 8.7–10.2)
Chloride: 107 mmol/L — ABNORMAL HIGH (ref 96–106)
Creatinine, Ser: 0.91 mg/dL (ref 0.76–1.27)
Globulin, Total: 2.6 g/dL (ref 1.5–4.5)
Glucose: 128 mg/dL — ABNORMAL HIGH (ref 70–99)
Potassium: 4.5 mmol/L (ref 3.5–5.2)
Sodium: 142 mmol/L (ref 134–144)
Total Protein: 6.6 g/dL (ref 6.0–8.5)
eGFR: 106 mL/min/{1.73_m2} (ref >59)

## 2023-08-09 LAB — TESTOSTERONE, FREE, TOTAL, SHBG
Sex Hormone Binding: 21.1 nmol/L (ref 16.5–55.9)
Testosterone, Free: 5.3 pg/mL — ABNORMAL LOW (ref 6.8–21.5)
Testosterone: 205 ng/dL — ABNORMAL LOW (ref 264–916)

## 2023-08-09 LAB — CBC WITH DIFFERENTIAL/PLATELET
Basophils Absolute: 0 10*3/uL (ref 0.0–0.2)
Basos: 1 %
EOS (ABSOLUTE): 0.1 10*3/uL (ref 0.0–0.4)
Eos: 1 %
Hematocrit: 41.8 % (ref 37.5–51.0)
Hemoglobin: 13.8 g/dL (ref 13.0–17.7)
Immature Grans (Abs): 0.1 10*3/uL (ref 0.0–0.1)
Immature Granulocytes: 1 %
Lymphocytes Absolute: 2.8 10*3/uL (ref 0.7–3.1)
Lymphs: 32 %
MCH: 29.5 pg (ref 26.6–33.0)
MCHC: 33 g/dL (ref 31.5–35.7)
MCV: 89 fL (ref 79–97)
Monocytes Absolute: 0.5 10*3/uL (ref 0.1–0.9)
Monocytes: 6 %
Neutrophils Absolute: 5 10*3/uL (ref 1.4–7.0)
Neutrophils: 59 %
Platelets: 236 10*3/uL (ref 150–450)
RBC: 4.68 x10E6/uL (ref 4.14–5.80)
RDW: 13.1 % (ref 11.6–15.4)
WBC: 8.6 10*3/uL (ref 3.4–10.8)

## 2023-08-09 LAB — T4, FREE: Free T4: 2 ng/dL — ABNORMAL HIGH (ref 0.82–1.77)

## 2023-08-09 LAB — THYROGLOBULIN ANTIBODY: Thyroglobulin Antibody: <1 [IU]/mL (ref 0.0–0.9)

## 2023-08-09 LAB — LUTEINIZING HORMONE: LH: 3.8 m[IU]/mL (ref 1.7–8.6)

## 2023-08-09 LAB — FERRITIN: Ferritin: 122 ng/mL (ref 30–400)

## 2023-08-09 LAB — THYROGLOBULIN LEVEL: Thyroglobulin (TG-RIA): <2 ng/mL

## 2023-08-09 LAB — FOLLICLE STIMULATING HORMONE: FSH: 3.5 m[IU]/mL (ref 1.5–12.4)

## 2023-08-09 LAB — PROLACTIN: Prolactin: 12.2 ng/mL (ref 3.9–22.7)

## 2023-08-09 LAB — VITAMIN B12: Vitamin B-12: 263 pg/mL (ref 232–1245)

## 2023-08-09 LAB — TSH: TSH: 0.943 u[IU]/mL (ref 0.450–4.500)

## 2023-08-09 LAB — PSA: Prostate Specific Ag, Serum: 0.5 ng/mL (ref 0.0–4.0)

## 2023-08-09 LAB — VITAMIN D 25 HYDROXY (VIT D DEFICIENCY, FRACTURES): Vit D, 25-Hydroxy: 54.2 ng/mL (ref 30.0–100.0)

## 2023-08-13 ENCOUNTER — Ambulatory Visit: Payer: BC Managed Care – PPO | Admitting: "Endocrinology

## 2023-08-13 ENCOUNTER — Encounter: Payer: Self-pay | Admitting: "Endocrinology

## 2023-08-13 VITALS — BP 162/122 | HR 76 | Ht 70.0 in | Wt 273.4 lb

## 2023-08-13 DIAGNOSIS — E782 Mixed hyperlipidemia: Secondary | ICD-10-CM

## 2023-08-13 DIAGNOSIS — E559 Vitamin D deficiency, unspecified: Secondary | ICD-10-CM

## 2023-08-13 DIAGNOSIS — Z6839 Body mass index (BMI) 39.0-39.9, adult: Secondary | ICD-10-CM

## 2023-08-13 DIAGNOSIS — I10 Essential (primary) hypertension: Secondary | ICD-10-CM | POA: Diagnosis not present

## 2023-08-13 DIAGNOSIS — E890 Postprocedural hypothyroidism: Secondary | ICD-10-CM

## 2023-08-13 DIAGNOSIS — Z7984 Long term (current) use of oral hypoglycemic drugs: Secondary | ICD-10-CM

## 2023-08-13 DIAGNOSIS — C73 Malignant neoplasm of thyroid gland: Secondary | ICD-10-CM | POA: Diagnosis not present

## 2023-08-13 DIAGNOSIS — F17200 Nicotine dependence, unspecified, uncomplicated: Secondary | ICD-10-CM

## 2023-08-13 DIAGNOSIS — E119 Type 2 diabetes mellitus without complications: Secondary | ICD-10-CM

## 2023-08-13 DIAGNOSIS — E291 Testicular hypofunction: Secondary | ICD-10-CM

## 2023-08-13 MED ORDER — EMPAGLIFLOZIN 10 MG PO TABS
10.0000 mg | ORAL_TABLET | Freq: Every day | ORAL | 1 refills | Status: AC
Start: 2023-08-13 — End: ?

## 2023-08-13 MED ORDER — CARVEDILOL 25 MG PO TABS: 25.0000 mg | ORAL_TABLET | Freq: Two times a day (BID) | ORAL | 1 refills | Status: DC

## 2023-08-13 MED ORDER — LEVOTHYROXINE SODIUM 175 MCG PO TABS: 175.0000 ug | ORAL_TABLET | Freq: Every day | ORAL | 1 refills | Status: DC

## 2023-08-13 NOTE — Patient Instructions (Signed)

## 2023-08-13 NOTE — Progress Notes (Signed)
 08/13/2023     Endocrinology follow-up note   Subjective:    Patient ID: Tyler [name], male    DOB: September 09, 1975, PCP Rakes, Doralee Albino, FNP   Past Medical History:  Diagnosis Date   Arthritis    Cardiomyopathy [facility])    CHF (congestive heart failure) (HCC)    Complication of anesthesia    Woke up during surgery   Coronary artery disease    Diabetes (HCC)    Gout    High cholesterol    Hypertension    Low testosterone 04/17/2021   Malignant neoplasm of thyroid gland (HCC) 04/17/2016   Pneumonia    Postsurgical hypothyroidism    thyroid cancer   Sleep apnea    Vitamin D insufficiency    Past Surgical History:  Procedure Laterality Date   CYSTOSCOPY W/ URETERAL STENT PLACEMENT Bilateral 02/25/2023   Procedure: CYSTOSCOPY WITHright  RETROGRADE PYELOGRAM/right URETERAL STENT PLACEMENT and left stone removal;  Surgeon: Alfredo Martinez, MD;  Location: [facility];  Service: Urology;  Laterality: Bilateral;   CYSTOSCOPY/URETEROSCOPY/HOLMIUM LASER/STENT PLACEMENT Right 03/30/2023   Procedure: CYSTOSCOPY, RIGHT RETROGRADE PYELOGRAM, RIGHT URETEROSCOPY, HOLMIUM LASER LITHOTRIPSY, AND RIGHT URETERAL STENT EXCHANGE;  Surgeon: Noel Christmas, MD;  Location: [facility];  Service: Urology;  Laterality: Right;  60 MINUTES   EYE SURGERY Left    cataract removal   FRACTURE SURGERY Left 06/23/1987   ankle   THYROIDECTOMY N/A 03/03/2013   Procedure: TOTAL THYROIDECTOMY;  Surgeon: Dalia Heading, MD;  Location: [facility];  Service: General;  Laterality: N/A;   TONSILLECTOMY     TOTAL HIP ARTHROPLASTY Right 07/28/2021   TOTAL HIP ARTHROPLASTY Left 09/08/2022   Procedure: TOTAL HIP ARTHROPLASTY ANTERIOR APPROACH;  Surgeon: Durene Romans, MD;  Location: [facility];  Service: Orthopedics;  Laterality: Left;   Social History   Socioeconomic History   Marital status: Single    Spouse name: Not on file   Number of children: Not on file   Years of education: Not on file   Highest education level: Not on  file  Occupational History   Not on file  Tobacco Use   Smoking status: Every Day    Current packs/day: 0.50    Average packs/day: 0.5 packs/day for 10.0 years (5.0 ttl pk-yrs)    Types: Cigarettes   Smokeless tobacco: Former  Building services engineer status: Never Used  Substance and Sexual Activity   Alcohol use: Yes    Comment: occasional   Drug use: No   Sexual activity: Yes    Birth control/protection: None  Other Topics Concern   Not on file  Social History Narrative   Works outside   Social Drivers of [employer] Strain: Low Risk  (02/23/2023)   Received from [facility]   Overall Financial Resource Strain (CARDIA)    Difficulty of Paying Living Expenses: Not hard at all  Food Insecurity: No Food Insecurity (03/01/2023)   Hunger Vital Sign    Worried About Running Out of Food in the Last Year: Never true    Ran Out of Food in the Last Year: Never true  Transportation Needs: No Transportation Needs (03/01/2023)   PRAPARE - Administrator, Civil Service (Medical): No    Lack of Transportation (Non-Medical): No  Recent Concern: Transportation Needs - Unmet Transportation Needs (02/23/2023)   Received from [facility] - Transportation    Lack of Transportation (Medical): Yes    Lack of Transportation (Non-Medical):  Yes  Physical Activity: Not on file  Stress: Not on file  Social Connections: Unknown (10/24/2021)   Received from [facility]   Social Network    Social Network: Not on file   Outpatient Encounter Medications as of 08/13/2023  Medication Sig   empagliflozin (JARDIANCE) 10 MG TABS tablet Take 1 tablet (10 mg total) by mouth daily with breakfast.   Accu-Chek Softclix Lancets lancets Test BS morning, noon and at bedtime Dx E11.65   aspirin EC 81 MG tablet Take 81 mg by mouth daily.   Blood Glucose Monitoring Suppl DEVI 1 each by Does not apply route in the morning, at noon, and at bedtime. May substitute to any  manufacturer covered by patient's insurance.   carvedilol (COREG) 25 MG tablet Take 1 tablet (25 mg total) by mouth 2 (two) times daily with a meal.   celecoxib (CELEBREX) 200 MG capsule Take 200 mg by mouth 2 (two) times daily.   colchicine 0.6 MG tablet TAKE 2 TABLETS AT ONSET, THEN 1 TABLET 2 HOURS LATER- A MAXIMUM OF 3 TABLETS IN 24 HOURS (Patient taking differently: Take 0.6-1.2 mg by mouth See admin instructions. TAKE 1.2 mg (2 TABLETS) by mouth AT ONSET OF GOUT FLARE, THEN 0.6 mg (1 TABLET) 2 HOURS LATER AS NEEDED- A MAXIMUM OF 1.8 mg (3 TABLETS IN 24 HOURS))   cyanocobalamin (VITAMIN B12) 1000 MCG tablet Take 1 tablet (1,000 mcg total) by mouth daily.   glucose blood (ACCU-CHEK GUIDE) test strip Test BS morning, noon and at bedtime Dx E11.65   Iron, Ferrous Sulfate, 325 (65 Fe) MG TABS Take 325 mg by mouth daily.   levothyroxine (SYNTHROID) 175 MCG tablet Take 1 tablet (175 mcg total) by mouth daily.   magnesium oxide (MAG-OX) 400 (240 Mg) MG tablet Take 400 mg by mouth at bedtime.   meloxicam (MOBIC) 15 MG tablet Take 1 tablet (15 mg total) by mouth daily.   metFORMIN (GLUCOPHAGE) 1000 MG tablet TAKE 1 TABLET (1,000 MG TOTAL) BY MOUTH TWICE A DAY WITH FOOD   methocarbamol (ROBAXIN) 500 MG tablet Take 1 tablet (500 mg total) by mouth every 6 (six) hours as needed for muscle spasms.   mupirocin ointment (BACTROBAN) 2 % Apply 1 Application topically 2 (two) times daily as needed (as directed for irritation- affected areas).   naproxen (NAPROSYN) 500 MG tablet Take by mouth.   nystatin (MYCOSTATIN/NYSTOP) powder Apply 1 Application topically 2 (two) times daily as needed (as directed for irritation- affected areas).   omega-3 acid ethyl esters (LOVAZA) 1 g capsule TAKE 2 CAPSULES BY MOUTH TWICE A DAY   oxyCODONE (ROXICODONE) 5 MG immediate release tablet Take 1 tablet (5 mg total) by mouth every 6 (six) hours as needed for severe pain (pain score 7-10).   oxyCODONE-acetaminophen  (PERCOCET/ROXICET) 5-325 MG tablet Take 1 tablet by mouth every 4 (four) hours as needed.   pravastatin (PRAVACHOL) 40 MG tablet TAKE 1 TABLET BY MOUTH EVERY DAY   sacubitril-valsartan (ENTRESTO) 97-103 MG Take 1 tablet by mouth 2 (two) times daily.   TURMERIC CURCUMIN PO Take 1,000 mg by mouth 2 (two) times daily.   TYLENOL 500 MG tablet Take 1,000 mg by mouth every 6 (six) hours as needed for mild pain or headache.   [DISCONTINUED] carvedilol (COREG) 12.5 MG tablet Take 1 tablet (12.5 mg total) by mouth 2 (two) times daily with a meal.   [DISCONTINUED] levothyroxine (SYNTHROID) 200 MCG tablet TAKE 1 TABLET BY MOUTH EVERY DAY   [  DISCONTINUED] levothyroxine (SYNTHROID) 25 MCG tablet TAKE 1 TABLET BY MOUTH DAILY BEFORE BREAKFAST. (Patient not taking: Reported on 08/13/2023)   No facility-administered encounter medications on file as of 08/13/2023.   ALLERGIES: Allergies  Allergen Reactions   Isosorbide Other (See Comments)    Lowers the blood pressure too much   VACCINATION STATUS: Immunization History  Administered Date(s) Administered   Influenza-Unspecified 05/20/2000   Janssen (J&J) SARS-COV-2 Vaccination 04/03/2020   Td 04/08/1999   Tdap 05/05/2020    HPI  48 yr old male with medical history as above . He underwent total thyroidectomy for thyroid cancer on 03/03/2013 by Dr. Lovell Sheehan at [facility] in [place].   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 no lymph nodes were identified.  He received his first Thyrogen stimulated I -131 thyroid remnant ablation on 05/05/13 same, with no evidence of distant metastasis. -Subsequent Thyrogen Stimulated  whole body scan in January 2017  was reported to be unremarkable for tumor recurrence/distant metastases.    -On March 17, 2021, he underwent Thyrogen stimulated whole-body scan which was unremarkable. His most recent thyroglobulin and  thyroglobulin bodies on July 30, 2023 were undetectable. He presents with repeat thyroid function test consistent with slight over replacement.  He remains on levothyroxine 200 mcg p.o. daily before breakfast.   - He also has hypertension, type 2 diabetes, currently on metformin.  His point-of-care A1c is slightly better at 7.1%.   He is currently on metformin 1000 mg p.o. twice daily.  For unclear reasons, he did not fill the Jardiance prescribed during last visit.   His blood pressure remains uncontrolled despite Coreg 12.5 mg p.o. twice daily, and Entresto 97-103 mg p.o. twice daily.  He continues to be a smoker.   He denies chest pain, no shortness of breath.  His initial blood pressure was 158/106 mmHg, repeat after 20 minutes in office was 162/122 mmHg.  His recent labs showed hypogonadism, wishes to avoid testosterone for now.     Review of Systems  Review of systems  Constitutional: + Minimally fluctuating body weight,  current  Body mass index is 39.23 kg/m. , no fatigue, no subjective hyperthermia, no subjective hypothermia    Objective:    BP (!) 158/106   Pulse 76   Ht 5\' 10"  (1.778 m)   Wt 273 lb 6.4 oz (124 kg)   BMI 39.23 kg/m   Wt Readings from Last 3 Encounters:  08/13/23 273 lb 6.4 oz (124 kg)  07/08/23 272 lb 7.8 oz (123.6 kg)  06/20/23 274 lb 12.8 oz (124.6 kg)       Diabetic Labs (most recent): Lab Results  Component Value Date   HGBA1C 7.1 (H) 06/04/2023   HGBA1C 7.4 (H) 03/02/2023   HGBA1C 7.2 (H) 02/18/2023   MICROALBUR 29.5 02/12/2020    Lipid Panel     Component Value Date/Time   CHOL 137 07/30/2023 1302   TRIG 148 07/30/2023 1302   HDL 28 (L) 07/30/2023 1302   CHOLHDL 4.9 07/30/2023 1302   LDLCALC 83 07/30/2023 1302   LABVLDL 26 07/30/2023 1302      Latest Ref Rng & Units 07/30/2023    1:02 PM 06/20/2023    3:13 PM 06/04/2023    2:42 PM  CMP  Glucose 70 - 99 mg/dL 161  096  045   BUN 6 - 24 mg/dL 7  10  15    Creatinine 0.76 -  1.27  mg/dL 8.29  5.62  1.30   Sodium 134 - 144 mmol/L 142  137  140   Potassium 3.5 - 5.2 mmol/L 4.5  3.7  4.2   Chloride 96 - 106 mmol/L 107  104  101   CO2 20 - 29 mmol/L 21  21  20    Calcium 8.7 - 10.2 mg/dL 9.3  9.3  86.5   Total Protein 6.0 - 8.5 g/dL 6.6   7.6   Total Bilirubin 0.0 - 1.2 mg/dL 0.5   0.2   Alkaline Phos 44 - 121 IU/L 87   96   AST 0 - 40 IU/L 18   17   ALT 0 - 44 IU/L 34   25     Assessment & Plan:    Hypertensive crisis: Initial blood pressure 158/106, repeat 162/122.  Patient has no chest pain or shortness of breath. He has Entresto 97-103 mg p.o. twice daily, Coreg 12.5 mg p.o. twice daily. I discussed the risk of uncontrolled blood pressure for cardiovascular disease, and in light of the fact that he did have established cardiovascular disease, I sent him to emergency room with a note for better control.  He is advised not to go home without or before addressing hypertension in the emergency room. Afterwards, I will increase his Coreg to 25 mg p.o. twice daily, continue Entresto same dose.  He is advised to monitor blood pressure at home and report if readings are higher than 130/80 mmHg.  Patient is hesitant to add medication, may benefit from additional medications such as diuretics/HCTZ, he wishes to address this with his PMD.  The patient was counseled on the dangers of tobacco use, and was advised to quit.  Reviewed strategies to maximize success, including removing cigarettes and smoking materials from environment.   2. Postsurgical hypothyroidism  His recent thyroid function tests are consistent with slight over replacement.  I discussed and lowered his levothyroxine to 175 mcg p.o. daily before breakfast.     - We discussed about the correct intake of his thyroid hormone, on empty stomach at fasting, with water, separated by at least 30 minutes from breakfast and other medications,  and separated by more than 4 hours from calcium, iron, multivitamins, acid  reflux medications (PPIs). -Patient is made aware of the fact that thyroid hormone replacement is needed for life, dose to be adjusted by periodic monitoring of thyroid function tests.    3. 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 - After his surgery, he received  of I-131 remnant ablation on 05/05/13,  post therapy whole body scar showing no evidence of distant metastasis.  - surveillance ultrasound of the neck/thyroid in February 2016 was negative for thyroid remnants.  - whole-body scan with Thyrogen was negative for tumor recurrence on 07/05/2015.  -Another surveillance  thyroid/neck ultrasound on 06/09/2016 is remarkable for surgically absent thyroid.  - on October 04, 2017 he underwent another surveillance Thyrogen stimulated  whole-body scan with negative findings for tumor recurrence or distant metastasis. -His previous labs show thyroglobulin level undetectable (< 2) -Previsit surveillance thyroid/neck ultrasound negative for  residual thyroid tissue or recurrence or lymphadenopathy.  -September 2022 Thyrogen stimulated whole-body scan is unremarkable and favorable for tumor remission.   -His  previsit labs show undetectable thyroglobulin and thyroglobulin bodies.      4.  Type 2 diabetes: Previsit  A1c 7.1%   He is advised to continue metformin 1000 mg p.o. daily.  For unclear reasons, he did not feel the Jardiance prescribed for him during last visit.  I discussed and prescribed Jardiance 10 mg p.o. daily at breakfast.   Side effects and precautions discussed with him.   I had a long discussion with this patient on potential complications of uncontrolled diabetes.  His diabetes so far is  complicated by obesity/sedentary life and patient remains at exceedingly high risk for complications including    CAD, CVA, CKD, retinopathy, and neuropathy. These are all discussed in detail with him.  - I have counseled him on diet management and weight loss, by adopting whole food plant strong diet. - he acknowledges that there is a room for improvement in his food and drink choices. - Suggestion is made for him to avoid simple carbohydrates  from his diet including Cakes, Sweet Desserts, Ice Cream, Soda (diet and regular), Sweet Tea, Candies, Chips, Cookies, Store Bought Juices, Alcohol in Excess of  1-2 drinks a day, Artificial Sweeteners,  Coffee Creamer, and "Sugar-free" Products, Lemonade. This will help patient to have more stable blood glucose profile and potentially avoid unintended weight gain.   - I encouraged him to switch to  unprocessed or minimally processed complex starch and increased protein intake (animal or plant source), fruits, and vegetables.  -In light of his history of thyroid malignancy, he is not an optimal candidate for GLP-1 receptor agonists nor DPP 4 inhibitors.      5) hyperlipidemia-uncontrolled lipid panel with LDL of 119 and triglycerides of 167.   He is advised to continue pravastatin 40 mg p.o. nightly.  Admittedly, he has been hesitant to take this medication based on "his research" of side effects.  he absolutely needs it unless his engagement with whole food plant-based diet is optimal.     6) hypogonadism: His recent labs show total testosterone of 205, improving . he is not a suitable candidate for androgen replacement therapy at this time given his history of sleep apnea, hyperlipidemia.  He wishes to delay any consideration of androgen replacement therapy  until he works on his lifestyle.   - Patient specific target  A1c;  LDL, HDL, Triglycerides, and  Waist Circumference were discussed in detail.   - I advised patient to maintain close follow up  with Rakes, Doralee Albino, FNP for primary care needs.  I spent  42  minutes in the care of the patient today including review of labs from Thyroid Function, CMP, and other relevant labs ; imaging/biopsy records (current and previous including abstractions from other facilities); face-to-face time discussing  his lab results and symptoms, medications doses, his options of short and long term treatment based on the latest standards of care / guidelines;   and documenting the encounter.  Mary Sella Dowling  participated in the discussions, expressed understanding, and voiced agreement with the above plans.  All questions were answered to his satisfaction. he is encouraged to contact clinic should he have any questions or concerns prior to his return visit.    Follow up plan: Return in about 3 months (around 11/10/2023) for F/U with Pre-visit Labs, A1c -NV.  Marquis Lunch, MD Phone: [phone]  Fax: [phone]  This note was partially dictated with voice recognition software. Similar sounding words can be transcribed inadequately or may not  be corrected  upon review.  08/13/2023, 10:14 AM

## 2023-09-10 DIAGNOSIS — Z3141 Encounter for fertility testing: Secondary | ICD-10-CM | POA: Diagnosis not present

## 2023-09-29 ENCOUNTER — Other Ambulatory Visit: Payer: BC Managed Care – PPO

## 2023-10-04 NOTE — Addendum Note (Signed)
 Addended by: Sabas Cradle A on: 10/04/2023 03:20 PM   Modules accepted: Orders

## 2023-10-06 ENCOUNTER — Inpatient Hospital Stay: Payer: BC Managed Care – PPO | Admitting: Physician Assistant

## 2023-10-08 ENCOUNTER — Inpatient Hospital Stay: Attending: Hematology

## 2023-10-08 DIAGNOSIS — E538 Deficiency of other specified B group vitamins: Secondary | ICD-10-CM | POA: Diagnosis not present

## 2023-10-08 DIAGNOSIS — D72829 Elevated white blood cell count, unspecified: Secondary | ICD-10-CM | POA: Insufficient documentation

## 2023-10-08 DIAGNOSIS — D508 Other iron deficiency anemias: Secondary | ICD-10-CM

## 2023-10-08 LAB — CBC WITH DIFFERENTIAL/PLATELET
Abs Immature Granulocytes: 0.06 10*3/uL (ref 0.00–0.07)
Basophils Absolute: 0.1 10*3/uL (ref 0.0–0.1)
Basophils Relative: 1 %
Eosinophils Absolute: 0.2 10*3/uL (ref 0.0–0.5)
Eosinophils Relative: 2 %
HCT: 45.5 % (ref 39.0–52.0)
Hemoglobin: 14.4 g/dL (ref 13.0–17.0)
Immature Granulocytes: 1 %
Lymphocytes Relative: 40 %
Lymphs Abs: 3.3 10*3/uL (ref 0.7–4.0)
MCH: 28.5 pg (ref 26.0–34.0)
MCHC: 31.6 g/dL (ref 30.0–36.0)
MCV: 89.9 fL (ref 80.0–100.0)
Monocytes Absolute: 0.6 10*3/uL (ref 0.1–1.0)
Monocytes Relative: 7 %
Neutro Abs: 4.1 10*3/uL (ref 1.7–7.7)
Neutrophils Relative %: 49 %
Platelets: 213 10*3/uL (ref 150–400)
RBC: 5.06 MIL/uL (ref 4.22–5.81)
RDW: 13.2 % (ref 11.5–15.5)
WBC: 8.3 10*3/uL (ref 4.0–10.5)
nRBC: 0 % (ref 0.0–0.2)

## 2023-10-08 LAB — FERRITIN: Ferritin: 24 ng/mL (ref 24–336)

## 2023-10-08 LAB — IRON AND TIBC
Iron: 62 ug/dL (ref 45–182)
Saturation Ratios: 16 % — ABNORMAL LOW (ref 17.9–39.5)
TIBC: 400 ug/dL (ref 250–450)
UIBC: 338 ug/dL

## 2023-10-11 LAB — METHYLMALONIC ACID, SERUM: Methylmalonic Acid, Quantitative: 188 nmol/L (ref 0–378)

## 2023-10-15 DIAGNOSIS — Z1212 Encounter for screening for malignant neoplasm of rectum: Secondary | ICD-10-CM | POA: Diagnosis not present

## 2023-10-21 ENCOUNTER — Encounter: Payer: Self-pay | Admitting: Family Medicine

## 2023-10-21 LAB — COLOGUARD: COLOGUARD: NEGATIVE

## 2023-11-12 ENCOUNTER — Ambulatory Visit: Payer: BC Managed Care – PPO | Admitting: "Endocrinology

## 2023-11-23 ENCOUNTER — Other Ambulatory Visit: Payer: Self-pay | Admitting: Family Medicine

## 2023-11-23 DIAGNOSIS — D508 Other iron deficiency anemias: Secondary | ICD-10-CM

## 2023-11-26 ENCOUNTER — Inpatient Hospital Stay

## 2023-11-26 ENCOUNTER — Inpatient Hospital Stay: Attending: Hematology | Admitting: Oncology

## 2023-11-26 VITALS — BP 153/121 | HR 73 | Temp 98.0°F | Resp 18 | Ht 70.0 in | Wt 278.3 lb

## 2023-11-26 DIAGNOSIS — F17210 Nicotine dependence, cigarettes, uncomplicated: Secondary | ICD-10-CM | POA: Insufficient documentation

## 2023-11-26 DIAGNOSIS — E538 Deficiency of other specified B group vitamins: Secondary | ICD-10-CM | POA: Insufficient documentation

## 2023-11-26 DIAGNOSIS — D72829 Elevated white blood cell count, unspecified: Secondary | ICD-10-CM | POA: Diagnosis present

## 2023-11-26 DIAGNOSIS — D508 Other iron deficiency anemias: Secondary | ICD-10-CM | POA: Diagnosis not present

## 2023-11-26 DIAGNOSIS — Z8639 Personal history of other endocrine, nutritional and metabolic disease: Secondary | ICD-10-CM | POA: Diagnosis not present

## 2023-11-26 LAB — CBC WITH DIFFERENTIAL/PLATELET
Abs Immature Granulocytes: 0.17 10*3/uL — ABNORMAL HIGH (ref 0.00–0.07)
Basophils Absolute: 0.1 10*3/uL (ref 0.0–0.1)
Basophils Relative: 1 %
Eosinophils Absolute: 0.3 10*3/uL (ref 0.0–0.5)
Eosinophils Relative: 3 %
HCT: 44.9 % (ref 39.0–52.0)
Hemoglobin: 15.3 g/dL (ref 13.0–17.0)
Immature Granulocytes: 2 %
Lymphocytes Relative: 34 %
Lymphs Abs: 3.7 10*3/uL (ref 0.7–4.0)
MCH: 29.9 pg (ref 26.0–34.0)
MCHC: 34.1 g/dL (ref 30.0–36.0)
MCV: 87.9 fL (ref 80.0–100.0)
Monocytes Absolute: 0.6 10*3/uL (ref 0.1–1.0)
Monocytes Relative: 5 %
Neutro Abs: 6.2 10*3/uL (ref 1.7–7.7)
Neutrophils Relative %: 55 %
Platelets: 235 10*3/uL (ref 150–400)
RBC: 5.11 MIL/uL (ref 4.22–5.81)
RDW: 12.6 % (ref 11.5–15.5)
WBC: 11 10*3/uL — ABNORMAL HIGH (ref 4.0–10.5)
nRBC: 0 % (ref 0.0–0.2)

## 2023-11-26 LAB — IRON AND TIBC
Iron: 55 ug/dL (ref 45–182)
Saturation Ratios: 15 % — ABNORMAL LOW (ref 17.9–39.5)
TIBC: 372 ug/dL (ref 250–450)
UIBC: 317 ug/dL

## 2023-11-26 LAB — VITAMIN B12: Vitamin B-12: 271 pg/mL (ref 180–914)

## 2023-11-26 LAB — FERRITIN: Ferritin: 35 ng/mL (ref 24–336)

## 2023-11-26 NOTE — Progress Notes (Signed)
 [facility] [address] [place] [address]   Clinic Day:  11/26/2023  Referring physician: Galvin Jules, FNP  Patient Care Team: Galvin Jules, FNP as PCP - General (Family Medicine) Hyland Mailman, MD as Referring Physician (Optometry) Hazle Lites, MD as Consulting Physician (Orthopedic Surgery) Baby Bolt, MD as Consulting Physician (Endocrinology) Georges Kings, MD as Referring Physician (Cardiology) Arlyce Berger, RN as [facility] Care Management Nadean August, NT as Technician   ASSESSMENT & PLAN:   Assessment:  1.  B12 deficiency: - Patient seen at the request of [name], FNP. - Labs on 06/04/2023: Hb-14.8, ferritin-216, percent saturation 6, B12 145, folic acid  normal.  TIBC was 340.  Creatinine was 1.16. - He had a history of kidney failure in September from renal stones. - Denies BRBPR/melena.  He is not on oral iron  supplements.  Denies any tingling or numbness in the extremities.  No B symptoms. - Ambulation is limited from right knee problem.  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 Monte Antonio. - He also has intermittent leukocytosis, but had recent skin infection in the groin region and required antibiotics.  2. Social/Family History: -Lives at home with wife. Currently does not work due to torn meniscus of right knee. Tobacco use of 5 packs per week since age 62.  -Mother had lupus. Maternal grandmother had thyroid  cancer. Father died of cancer, type unknown.   Plan:  1.  B12 deficiency: - He received 1 B12 injection on 07/08/2023.  B12 level on 07/30/2023 improved to 263 (145).  MMA was 188 (10/08/23).  -Iron  levels from 10/08/2023 show a ferritin of 24, iron  saturations 16% with normal TIBC. -Recommend continuing B12 1 mg tablets daily along with 325 mg iron  tablets. -Recommend 1 additional lab draw in 4 months and if levels are improving, patient may be  discharged from our clinic.  2.  Neutrophilic leukocytosis: - He had elevated white count and neutrophil count in December.  At that time he had infection in the skin of the groin region.  He was treated with antibiotics.   Disposition:  B12 and iron  levels today.  If they are still low, recommend rechecking labs in 3 months.  This can be a telephone call. Consistently take B12 and iron  supplements.   Orders Placed This Encounter  Procedures   Ferritin    Standing Status:   Future    Expected Date:   03/27/2024    Expiration Date:   11/25/2024   Iron  and TIBC ([facility] ONLY)    Standing Status:   Future    Expected Date:   03/27/2024    Expiration Date:   11/25/2024   CBC with Differential/Platelet    Standing Status:   Future    Expected Date:   03/27/2024    Expiration Date:   11/25/2024   Vitamin B12    Standing Status:   Future    Expected Date:   03/27/2024    Expiration Date:   11/25/2024   Methylmalonic acid, serum    Standing Status:   Future    Expected Date:   03/27/2024    Expiration Date:   11/25/2024   CBC with Differential/Platelet    Standing Status:   Future    Number of Occurrences:   1    Expected Date:   11/26/2023    Expiration Date:   11/25/2024  Iron  and TIBC ([facility] ONLY)    Standing Status:   Future    Number of Occurrences:   1    Expected Date:   11/26/2023    Expiration Date:   11/25/2024   Vitamin B12    Standing Status:   Future    Number of Occurrences:   1    Expected Date:   11/26/2023    Expiration Date:   11/25/2024   Methylmalonic acid, serum    Standing Status:   Future    Number of Occurrences:   1    Expected Date:   11/26/2023    Expiration Date:   11/25/2024   Ferritin    Standing Status:   Future    Number of Occurrences:   1    Expected Date:   11/26/2023    Expiration Date:   11/25/2024    Aurther Blue, NP   6/6/202511:43 AM  CHIEF COMPLAINT/PURPOSE OF CONSULT:   Diagnosis: B12 deficiency and  leukocytosis  Current Therapy: B12 supplements  HISTORY OF PRESENT ILLNESS:  Tyler [name] has a history of DM type 2, heart failure, and urolithiasis (recent rempval of kidney stone on 02/25/23 with right ureteral stent placement, then stent exchange on 03/30/23). Stents have since been removed and kidney function was normal when checked 1 week ago. Of note he has arthralgias, particularly in the right knee from osteoarthritis. He has a torn meniscus in the right knee and has an appointment with surgery today.  Patient was seen by his PCP for an annual physical exam on 06/04/23. Blood work was done at that time. He was found to have abnormal CBC from 06/24/23 with elevated WBC at 12.3, elevated MPV at 10.6, and elevated neutrophils at 8.3. His Anemia Profile B from 06/04/23 found low iron  at 21, low iron  saturation at 6, and low Vitamin B 12 at 145. Vitamin D  levels were low at 24.6 on 06/04/23.   Today, he states that he is doing well overall. His appetite level is at 90%. His energy level is at 75%. He is accompanied by his wife. He notes he had kidney stones in September 2024 and after that temporarily discontinued medication, which he attributes to the abnormal lab work. He reports a groin skin infection with sores from Jardiance , treated with antibiotics, in late 2024. He notes joint swellings and has a history of gout. He has been taking B complex vitamins after lab work was done. He has not started oral iron  supplements. He denies any hematuria, BRBPR, melena, or peripheral neuropathy. He denies any unintentional weight loss, night sweats, or fevers.   Patient has a history of thyroid  cancer with a thyroidectomy 8 years ago. He is taking Synthroid  and is seen by Dr. Monte Antonio for follow-up of this.     Interval history:  [name] is a 48 y.o. male presenting to clinic today for evaluation of leukocytosis, as well as low iron  and Vitamin B12 at the request of Evalyn Hillier, FNP.  He was last seen in clinic  [facility] on 07/08/2023.  He received a B12 injection on 07/08/2023 and asked to start 1000 mg oral B12 daily.  He is here for recheck of his labs.  Reports he is taking B12 once per week and he never started on iron  supplements.  He denies any interval hospitalizations, surgeries or changes to his baseline health.  Reports he did not have labs drawn specifically for our clinic but will have them drawn today if he  can.   PAST MEDICAL HISTORY:   Past Medical History: 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   Pneumonia    Postsurgical hypothyroidism    thyroid  cancer   Sleep apnea    Vitamin D  insufficiency     Surgical History: Past Surgical History:  Procedure Laterality Date   CYSTOSCOPY W/ URETERAL STENT PLACEMENT Bilateral 02/25/2023   Procedure: CYSTOSCOPY WITHright  RETROGRADE PYELOGRAM/right URETERAL STENT PLACEMENT and left stone removal;  Surgeon: Erman Hayward, MD;  Location: [facility];  Service: Urology;  Laterality: Bilateral;   CYSTOSCOPY/URETEROSCOPY/HOLMIUM LASER/STENT PLACEMENT Right 03/30/2023   Procedure: CYSTOSCOPY, RIGHT RETROGRADE PYELOGRAM, RIGHT URETEROSCOPY, HOLMIUM LASER LITHOTRIPSY, AND RIGHT URETERAL STENT EXCHANGE;  Surgeon: Roxane Copp, MD;  Location: [facility];  Service: Urology;  Laterality: Right;  60 MINUTES   EYE SURGERY Left    cataract removal   FRACTURE SURGERY Left 06/23/1987   ankle   THYROIDECTOMY N/A 03/03/2013   Procedure: TOTAL THYROIDECTOMY;  Surgeon: Beau Bound, MD;  Location: [facility];  Service: General;  Laterality: N/A;   TONSILLECTOMY     TOTAL HIP ARTHROPLASTY Right 07/28/2021   TOTAL HIP ARTHROPLASTY Left 09/08/2022    Procedure: TOTAL HIP ARTHROPLASTY ANTERIOR APPROACH;  Surgeon: Claiborne Crew, MD;  Location: [facility];  Service: Orthopedics;  Laterality: Left;    Social History: Social History   Socioeconomic History   Marital status: Single    Spouse name: Not on file   Number of children: Not on file   Years of education: Not on file   Highest education level: Not on file  Occupational History   Not on file  Tobacco Use   Smoking status: Every Day    Current packs/day: 0.50    Average packs/day: 0.5 packs/day for 10.0 years (5.0 ttl pk-yrs)    Types: Cigarettes   Smokeless tobacco: Former  Building services engineer status: Never Used  Substance and Sexual Activity   Alcohol use: Yes    Comment: occasional   Drug use: No   Sexual activity: Yes    Birth control/protection: None  Other Topics Concern   Not on file  Social History Narrative   Works outside   Social Drivers of [employer] Strain: Low Risk  (02/23/2023)   Received from [facility]   Overall Financial Resource Strain (CARDIA)    Difficulty of Paying Living Expenses: Not hard at all  Food Insecurity: No Food Insecurity (03/01/2023)   Hunger Vital Sign    Worried About Running Out of Food in the Last Year: Never true    Ran Out of Food in the Last Year: Never true  Transportation Needs: No Transportation Needs (03/01/2023)   PRAPARE - Administrator, Civil Service (Medical): No    Lack of Transportation (Non-Medical): No  Recent Concern: Transportation Needs - Unmet Transportation Needs (02/23/2023)   Received from [facility]   PRAPARE - Transportation    Lack of Transportation (Medical): Yes    Lack of Transportation (Non-Medical): Yes  Physical Activity: Not on file  Stress: Not on file  Social Connections:  Unknown (10/24/2021)   Received from [facility], [facility]   Social Network    Social Network: Not on file  Intimate Partner Violence: Not At Risk (02/25/2023)   Humiliation, Afraid,  Rape, and Kick questionnaire    Fear of Current or Ex-Partner: No    Emotionally Abused: No    Physically Abused: No    Sexually Abused: No    Family History: Family History  Problem Relation Age of Onset   Hypertension Father    Hyperlipidemia Father     Current Medications:  Current Outpatient Medications:    Accu-Chek Softclix Lancets lancets, Test BS morning, noon and at bedtime Dx E11.65, Disp: 300 each, Rfl: 3   amLODipine  (NORVASC ) 5 MG tablet, Take 5 mg by mouth., Disp: , Rfl:    aspirin  EC 81 MG tablet, Take 81 mg by mouth daily., Disp: , Rfl:    Blood Glucose Monitoring Suppl DEVI, 1 each by Does not apply route in the morning, at noon, and at bedtime. May substitute to any manufacturer covered by patient's insurance., Disp: 1 each, Rfl: 0   carvedilol  (COREG ) 25 MG tablet, Take 1 tablet (25 mg total) by mouth 2 (two) times daily with a meal., Disp: 180 tablet, Rfl: 1   celecoxib (CELEBREX) 200 MG capsule, Take 200 mg by mouth 2 (two) times daily., Disp: , Rfl:    colchicine  0.6 MG tablet, TAKE 2 TABLETS AT ONSET, THEN 1 TABLET 2 HOURS LATER- A MAXIMUM OF 3 TABLETS IN 24 HOURS (Patient taking differently: Take 0.6-1.2 mg by mouth See admin instructions. TAKE 1.2 mg (2 TABLETS) by mouth AT ONSET OF GOUT FLARE, THEN 0.6 mg (1 TABLET) 2 HOURS LATER AS NEEDED- A MAXIMUM OF 1.8 mg (3 TABLETS IN 24 HOURS)), Disp: 270 tablet, Rfl: 0   cyanocobalamin  (VITAMIN B12) 1000 MCG tablet, Take 1 tablet (1,000 mcg total) by mouth daily., Disp: 30 tablet, Rfl: 6   empagliflozin  (JARDIANCE ) 10 MG TABS tablet, Take 1 tablet (10 mg total) by mouth daily with breakfast., Disp: 90 tablet, Rfl: 1   ferrous sulfate  325 (65 FE) MG tablet, TAKE 325 MG BY MOUTH DAILY., Disp: 90 tablet, Rfl: 0   glucose blood (ACCU-CHEK GUIDE) test strip, Test BS morning, noon and at bedtime Dx E11.65, Disp: 300 strip, Rfl: 3   levothyroxine  (SYNTHROID ) 175 MCG tablet, Take 1 tablet (175 mcg total) by mouth daily., Disp: 90  tablet, Rfl: 1   magnesium  oxide (MAG-OX) 400 (240 Mg) MG tablet, Take 400 mg by mouth at bedtime., Disp: , Rfl:    meloxicam  (MOBIC ) 15 MG tablet, Take 1 tablet (15 mg total) by mouth daily., Disp: 90 tablet, Rfl: 1   metFORMIN  (GLUCOPHAGE ) 1000 MG tablet, TAKE 1 TABLET (1,000 MG TOTAL) BY MOUTH TWICE A DAY WITH FOOD, Disp: 180 tablet, Rfl: 1   methocarbamol  (ROBAXIN ) 500 MG tablet, Take 1 tablet (500 mg total) by mouth every 6 (six) hours as needed for muscle spasms., Disp: 40 tablet, Rfl: 2   mupirocin  ointment (BACTROBAN ) 2 %, Apply 1 Application topically 2 (two) times daily as needed (as directed for irritation- affected areas)., Disp: , Rfl:    naproxen  (NAPROSYN ) 500 MG tablet, Take by mouth., Disp: , Rfl:    nystatin (MYCOSTATIN/NYSTOP) powder, Apply 1 Application topically 2 (two) times daily as needed (as directed for irritation- affected areas)., Disp: , Rfl:    omega-3 acid ethyl esters (LOVAZA ) 1 g capsule, TAKE 2 CAPSULES BY MOUTH TWICE A DAY, Disp: 360  capsule, Rfl: 1   oxyCODONE  (ROXICODONE ) 5 MG immediate release tablet, Take 1 tablet (5 mg total) by mouth every 6 (six) hours as needed for severe pain (pain score 7-10)., Disp: 12 tablet, Rfl: 0   oxyCODONE -acetaminophen  (PERCOCET/ROXICET) 5-325 MG tablet, Take 1 tablet by mouth every 4 (four) hours as needed., Disp: , Rfl:    pravastatin  (PRAVACHOL ) 40 MG tablet, TAKE 1 TABLET BY MOUTH EVERY DAY, Disp: 90 tablet, Rfl: 0   sacubitril -valsartan  (ENTRESTO ) 97-103 MG, Take 1 tablet by mouth 2 (two) times daily., Disp: , Rfl:    TURMERIC CURCUMIN PO, Take 1,000 mg by mouth 2 (two) times daily., Disp: , Rfl:    TYLENOL  500 MG tablet, Take 1,000 mg by mouth every 6 (six) hours as needed for mild pain or headache., Disp: , Rfl:    valACYclovir  (VALTREX ) 1000 MG tablet, Take 1,000 mg by mouth., Disp: , Rfl:    Allergies: Allergies  Allergen Reactions   Isosorbide Other (See Comments)    Lowers the blood pressure too much    REVIEW OF  SYSTEMS:   Review of Systems  Constitutional: Negative.      VITALS:   Blood pressure (!) 153/121, pulse 73, temperature 98 F (36.7 C), temperature source Oral, resp. rate 18, height 5\' 10"  (1.778 m), weight 278 lb 4.8 oz (126.2 kg), SpO2 93%.  Wt Readings from Last 3 Encounters:  11/26/23 278 lb 4.8 oz (126.2 kg)  08/13/23 273 lb 6.4 oz (124 kg)  07/08/23 272 lb 7.8 oz (123.6 kg)    Body mass index is 39.93 kg/m.   PHYSICAL EXAM:   Physical Exam Constitutional:      Appearance: Normal appearance. He is obese.  HENT:     Head: Normocephalic and atraumatic.  Eyes:     Pupils: Pupils are equal, round, and reactive to light.  Cardiovascular:     Rate and Rhythm: Normal rate and regular rhythm.     Heart sounds: Normal heart sounds. No murmur heard. Pulmonary:     Effort: Pulmonary effort is normal.     Breath sounds: Normal breath sounds. No wheezing.  Abdominal:     General: Bowel sounds are normal. There is no distension.     Palpations: Abdomen is soft.     Tenderness: There is no abdominal tenderness.  Musculoskeletal:        General: Normal range of motion.     Cervical back: Normal range of motion.  Skin:    General: Skin is warm and dry.     Findings: No rash.  Neurological:     Mental Status: He is alert and oriented to person, place, and time.  Psychiatric:        Judgment: Judgment normal.     LABS:   CBC    Component Value Date/Time   WBC 11.0 (H) 11/26/2023 0927   RBC 5.11 11/26/2023 0927   HGB 15.3 11/26/2023 0927   HGB 13.8 07/30/2023 1302   HCT 44.9 11/26/2023 0927   HCT 41.8 07/30/2023 1302   PLT 235 11/26/2023 0927   PLT 236 07/30/2023 1302   MCV 87.9 11/26/2023 0927   MCV 89 07/30/2023 1302   MCH 29.9 11/26/2023 0927   MCHC 34.1 11/26/2023 0927   RDW 12.6 11/26/2023 0927   RDW 13.1 07/30/2023 1302   LYMPHSABS 3.7 11/26/2023 0927   LYMPHSABS 2.8 07/30/2023 1302   MONOABS 0.6 11/26/2023 0927   EOSABS 0.3 11/26/2023 0927   EOSABS  0.1 07/30/2023 1302  BASOSABS 0.1 11/26/2023 0927   BASOSABS 0.0 07/30/2023 1302    CMP    Component Value Date/Time   NA 142 07/30/2023 1302   K 4.5 07/30/2023 1302   CL 107 (H) 07/30/2023 1302   CO2 21 07/30/2023 1302   GLUCOSE 128 (H) 07/30/2023 1302   GLUCOSE 183 (H) 06/20/2023 1513   BUN 7 07/30/2023 1302   CREATININE 0.91 07/30/2023 1302   CREATININE 0.99 02/12/2020 0933   CALCIUM  9.3 07/30/2023 1302   PROT 6.6 07/30/2023 1302   ALBUMIN  4.0 (L) 07/30/2023 1302   AST 18 07/30/2023 1302   ALT 34 07/30/2023 1302   ALKPHOS 87 07/30/2023 1302   BILITOT 0.5 07/30/2023 1302   GFRNONAA >60 06/20/2023 1513   GFRNONAA 94 02/12/2020 0933   GFRAA 109 02/12/2020 0933    No results found for: "CEA1", "CEA" / No results found for: "CEA1", "CEA" Lab Results  Component Value Date   PSA1 0.5 07/30/2023   No results found for: "UJW119" No results found for: "CAN125"  No results found for: "TOTALPROTELP", "ALBUMINELP", "A1GS", "A2GS", "BETS", "BETA2SER", "GAMS", "MSPIKE", "SPEI" Lab Results  Component Value Date   TIBC 372 11/26/2023   TIBC 400 10/08/2023   TIBC 340 06/04/2023   FERRITIN 35 11/26/2023   FERRITIN 24 10/08/2023   FERRITIN 122 07/30/2023   IRONPCTSAT 15 (L) 11/26/2023   IRONPCTSAT 16 (L) 10/08/2023   IRONPCTSAT 6 (LL) 06/04/2023   No results found for: "LDH"   STUDIES:   No results found.

## 2023-11-28 LAB — THYROGLOBULIN ANTIBODY: Thyroglobulin Antibody: <1 [IU]/mL (ref 0.0–0.9)

## 2023-11-28 LAB — THYROGLOBULIN LEVEL: Thyroglobulin (TG-RIA): <2 ng/mL

## 2023-11-28 LAB — TSH: TSH: 1.04 u[IU]/mL (ref 0.450–4.500)

## 2023-11-28 LAB — T4, FREE: Free T4: 1.53 ng/dL (ref 0.82–1.77)

## 2023-11-29 LAB — METHYLMALONIC ACID, SERUM: Methylmalonic Acid, Quantitative: 223 nmol/L (ref 0–378)

## 2023-12-03 ENCOUNTER — Other Ambulatory Visit: Payer: Self-pay | Admitting: "Endocrinology

## 2023-12-03 ENCOUNTER — Ambulatory Visit (INDEPENDENT_AMBULATORY_CARE_PROVIDER_SITE_OTHER): Admitting: "Endocrinology

## 2023-12-03 ENCOUNTER — Encounter: Payer: Self-pay | Admitting: "Endocrinology

## 2023-12-03 ENCOUNTER — Ambulatory Visit (INDEPENDENT_AMBULATORY_CARE_PROVIDER_SITE_OTHER): Payer: BC Managed Care – PPO | Admitting: Family Medicine

## 2023-12-03 ENCOUNTER — Other Ambulatory Visit: Payer: Self-pay | Admitting: Family Medicine

## 2023-12-03 ENCOUNTER — Encounter: Payer: Self-pay | Admitting: Family Medicine

## 2023-12-03 VITALS — BP 102/75 | HR 76 | Temp 97.3°F | Ht 70.0 in | Wt 278.6 lb

## 2023-12-03 VITALS — BP 120/86 | HR 72 | Ht 70.0 in | Wt 277.6 lb

## 2023-12-03 DIAGNOSIS — C73 Malignant neoplasm of thyroid gland: Secondary | ICD-10-CM | POA: Diagnosis not present

## 2023-12-03 DIAGNOSIS — I428 Other cardiomyopathies: Secondary | ICD-10-CM

## 2023-12-03 DIAGNOSIS — E538 Deficiency of other specified B group vitamins: Secondary | ICD-10-CM | POA: Insufficient documentation

## 2023-12-03 DIAGNOSIS — E119 Type 2 diabetes mellitus without complications: Secondary | ICD-10-CM | POA: Diagnosis not present

## 2023-12-03 DIAGNOSIS — E890 Postprocedural hypothyroidism: Secondary | ICD-10-CM | POA: Diagnosis not present

## 2023-12-03 DIAGNOSIS — E1169 Type 2 diabetes mellitus with other specified complication: Secondary | ICD-10-CM | POA: Diagnosis not present

## 2023-12-03 DIAGNOSIS — I10 Essential (primary) hypertension: Secondary | ICD-10-CM | POA: Diagnosis not present

## 2023-12-03 DIAGNOSIS — E781 Pure hyperglyceridemia: Secondary | ICD-10-CM

## 2023-12-03 DIAGNOSIS — E1165 Type 2 diabetes mellitus with hyperglycemia: Secondary | ICD-10-CM | POA: Diagnosis not present

## 2023-12-03 DIAGNOSIS — E559 Vitamin D deficiency, unspecified: Secondary | ICD-10-CM

## 2023-12-03 DIAGNOSIS — I152 Hypertension secondary to endocrine disorders: Secondary | ICD-10-CM

## 2023-12-03 DIAGNOSIS — D508 Other iron deficiency anemias: Secondary | ICD-10-CM

## 2023-12-03 DIAGNOSIS — D649 Anemia, unspecified: Secondary | ICD-10-CM | POA: Insufficient documentation

## 2023-12-03 DIAGNOSIS — E782 Mixed hyperlipidemia: Secondary | ICD-10-CM

## 2023-12-03 DIAGNOSIS — M2550 Pain in unspecified joint: Secondary | ICD-10-CM

## 2023-12-03 DIAGNOSIS — I2510 Atherosclerotic heart disease of native coronary artery without angina pectoris: Secondary | ICD-10-CM

## 2023-12-03 DIAGNOSIS — E1159 Type 2 diabetes mellitus with other circulatory complications: Secondary | ICD-10-CM | POA: Diagnosis not present

## 2023-12-03 DIAGNOSIS — Z7984 Long term (current) use of oral hypoglycemic drugs: Secondary | ICD-10-CM | POA: Diagnosis not present

## 2023-12-03 DIAGNOSIS — M7989 Other specified soft tissue disorders: Secondary | ICD-10-CM

## 2023-12-03 LAB — POCT GLYCOSYLATED HEMOGLOBIN (HGB A1C): HbA1c, POC (controlled diabetic range): 7.2 % — AB (ref 0.0–7.0)

## 2023-12-03 MED ORDER — AMLODIPINE BESYLATE 5 MG PO TABS: 5.0000 mg | ORAL_TABLET | Freq: Two times a day (BID) | ORAL | 1 refills | Status: AC

## 2023-12-03 NOTE — Patient Instructions (Signed)

## 2023-12-03 NOTE — Progress Notes (Signed)
 12/03/2023     Endocrinology follow-up note   Subjective:    Patient ID: Tyler [name], male    DOB: 08-Jun-1976, PCP Rakes, Georgeann Kindred, FNP   Past Medical History:  Diagnosis Date   Arthritis    Cardiomyopathy [facility])    CHF (congestive heart failure) (HCC)    Complication of anesthesia    Woke up during surgery   Coronary artery disease    Diabetes (HCC)    Gout    High cholesterol    Hypertension    Low testosterone  04/17/2021   Malignant neoplasm of thyroid  gland (HCC) 04/17/2016   Pneumonia    Postsurgical hypothyroidism    thyroid  cancer   Sleep apnea    Vitamin D  insufficiency    Past Surgical History:  Procedure Laterality Date   CYSTOSCOPY W/ URETERAL STENT PLACEMENT Bilateral 02/25/2023   Procedure: CYSTOSCOPY WITHright  RETROGRADE PYELOGRAM/right URETERAL STENT PLACEMENT and left stone removal;  Surgeon: Erman Hayward, MD;  Location: [facility];  Service: Urology;  Laterality: Bilateral;   CYSTOSCOPY/URETEROSCOPY/HOLMIUM LASER/STENT PLACEMENT Right 03/30/2023   Procedure: CYSTOSCOPY, RIGHT RETROGRADE PYELOGRAM, RIGHT URETEROSCOPY, HOLMIUM LASER LITHOTRIPSY, AND RIGHT URETERAL STENT EXCHANGE;  Surgeon: Roxane Copp, MD;  Location: [facility];  Service: Urology;  Laterality: Right;  60 MINUTES   EYE SURGERY Left    cataract removal   FRACTURE SURGERY Left 06/23/1987   ankle   THYROIDECTOMY N/A 03/03/2013   Procedure: TOTAL THYROIDECTOMY;  Surgeon: Beau Bound, MD;  Location: [facility];  Service: General;  Laterality: N/A;   TONSILLECTOMY     TOTAL HIP ARTHROPLASTY Right 07/28/2021   TOTAL HIP ARTHROPLASTY Left 09/08/2022   Procedure: TOTAL HIP ARTHROPLASTY ANTERIOR APPROACH;  Surgeon: Claiborne Crew, MD;  Location: [facility];  Service: Orthopedics;  Laterality: Left;   Social History   Socioeconomic History   Marital status: Single    Spouse name: Not on file   Number of children: Not on file   Years of education: Not on file   Highest education level: Associate  degree: occupational, Scientist, product/process development, or vocational program  Occupational History   Not on file  Tobacco Use   Smoking status: Every Day    Current packs/day: 0.50    Average packs/day: 0.5 packs/day for 10.0 years (5.0 ttl pk-yrs)    Types: Cigarettes   Smokeless tobacco: Former  Building services engineer status: Never Used  Substance and Sexual Activity   Alcohol use: Yes    Comment: occasional   Drug use: No   Sexual activity: Yes    Birth control/protection: None  Other Topics Concern   Not on file  Social History Narrative   Works outside   Social Drivers of [employer] Strain: Low Risk  (11/29/2023)   Overall Financial Resource Strain (CARDIA)    Difficulty of Paying Living Expenses: Not hard at all  Food Insecurity: No Food Insecurity (11/29/2023)   Hunger Vital Sign    Worried About Running Out of Food in the Last Year: Never true    Ran Out of Food in the Last Year: Never true  Transportation Needs: No Transportation Needs (11/29/2023)   PRAPARE - Administrator, Civil Service (Medical): No    Lack of Transportation (Non-Medical): No  Physical Activity: Unknown (11/29/2023)   Exercise Vital Sign    Days of Exercise per Week: 0 days    Minutes of Exercise per Session: Not on file  Stress: No Stress Concern Present (11/29/2023)  [employer] of Occupational Health - Occupational Stress Questionnaire    Feeling of Stress : Only a little  Social Connections: Moderately Integrated (11/29/2023)   Social Connection and Isolation Panel    Frequency of Communication with Friends and Family: More than three times a week    Frequency of Social Gatherings with Friends and Family: More than three times a week    Attends Religious Services: 1 to 4 times per year    Active Member of [employer] or Organizations: No    Attends Banker Meetings: Not on file    Marital Status: Married   Outpatient Encounter Medications as of 12/03/2023  Medication Sig    amLODipine  (NORVASC ) 5 MG tablet Take 5 mg by mouth. (Patient taking differently: Take 5 mg by mouth 2 (two) times daily.)   Accu-Chek Softclix Lancets lancets Test BS morning, noon and at bedtime Dx E11.65   aspirin  EC 81 MG tablet Take 81 mg by mouth daily.   Blood Glucose Monitoring Suppl DEVI 1 each by Does not apply route in the morning, at noon, and at bedtime. May substitute to any manufacturer covered by patient's insurance.   carvedilol  (COREG ) 25 MG tablet Take 1 tablet (25 mg total) by mouth 2 (two) times daily with a meal.   celecoxib (CELEBREX) 200 MG capsule Take 200 mg by mouth 2 (two) times daily.   colchicine  0.6 MG tablet TAKE 2 TABLETS AT ONSET, THEN 1 TABLET 2 HOURS LATER- A MAXIMUM OF 3 TABLETS IN 24 HOURS (Patient taking differently: Take 0.6-1.2 mg by mouth See admin instructions. TAKE 1.2 mg (2 TABLETS) by mouth AT ONSET OF GOUT FLARE, THEN 0.6 mg (1 TABLET) 2 HOURS LATER AS NEEDED- A MAXIMUM OF 1.8 mg (3 TABLETS IN 24 HOURS))   cyanocobalamin  (VITAMIN B12) 1000 MCG tablet Take 1 tablet (1,000 mcg total) by mouth daily.   empagliflozin  (JARDIANCE ) 10 MG TABS tablet Take 1 tablet (10 mg total) by mouth daily with breakfast.   ferrous sulfate  325 (65 FE) MG tablet TAKE 325 MG BY MOUTH DAILY.   glucose blood (ACCU-CHEK GUIDE) test strip Test BS morning, noon and at bedtime Dx E11.65   levothyroxine  (SYNTHROID ) 175 MCG tablet Take 1 tablet (175 mcg total) by mouth daily.   magnesium  oxide (MAG-OX) 400 (240 Mg) MG tablet Take 400 mg by mouth at bedtime.   meloxicam  (MOBIC ) 15 MG tablet Take 1 tablet (15 mg total) by mouth daily.   metFORMIN  (GLUCOPHAGE ) 1000 MG tablet TAKE 1 TABLET (1,000 MG TOTAL) BY MOUTH TWICE A DAY WITH FOOD   methocarbamol  (ROBAXIN ) 500 MG tablet Take 1 tablet (500 mg total) by mouth every 6 (six) hours as needed for muscle spasms.   mupirocin  ointment (BACTROBAN ) 2 % Apply 1 Application topically 2 (two) times daily as needed (as directed for irritation-  affected areas).   naproxen  (NAPROSYN ) 500 MG tablet Take by mouth.   nystatin (MYCOSTATIN/NYSTOP) powder Apply 1 Application topically 2 (two) times daily as needed (as directed for irritation- affected areas).   omega-3 acid ethyl esters (LOVAZA ) 1 g capsule TAKE 2 CAPSULES BY MOUTH TWICE A DAY   oxyCODONE  (ROXICODONE ) 5 MG immediate release tablet Take 1 tablet (5 mg total) by mouth every 6 (six) hours as needed for severe pain (pain score 7-10).   oxyCODONE -acetaminophen  (PERCOCET/ROXICET) 5-325 MG tablet Take 1 tablet by mouth every 4 (four) hours as needed.   pravastatin  (PRAVACHOL ) 40 MG tablet TAKE 1 TABLET BY MOUTH EVERY DAY  sacubitril -valsartan  (ENTRESTO ) 97-103 MG Take 1 tablet by mouth 2 (two) times daily.   TURMERIC CURCUMIN PO Take 1,000 mg by mouth 2 (two) times daily.   TYLENOL  500 MG tablet Take 1,000 mg by mouth every 6 (six) hours as needed for mild pain or headache.   valACYclovir  (VALTREX ) 1000 MG tablet Take 1,000 mg by mouth.   No facility-administered encounter medications on file as of 12/03/2023.   ALLERGIES: Allergies  Allergen Reactions   Isosorbide Other (See Comments)    Lowers the blood pressure too much   VACCINATION STATUS: Immunization History  Administered Date(s) Administered   Influenza-Unspecified 05/20/2000   Janssen (J&J) SARS-COV-2 Vaccination 04/03/2020   Td 04/08/1999   Tdap 05/05/2020    HPI  48 yr old male with medical history as above . He underwent total thyroidectomy for thyroid  cancer on 03/03/2013 by Dr. Larrie Po at [facility] in [place] .   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 no lymph nodes were identified.  He received his first Thyrogen  stimulated I -131 thyroid  remnant ablation on 05/05/13 same, with no evidence of distant metastasis. -Subsequent Thyrogen  Stimulated  whole body scan in January 2017  was reported to be  unremarkable for tumor recurrence/distant metastases.    -On March 17, 2021, he underwent Thyrogen  stimulated whole-body scan which was unremarkable. His most recent thyroglobulin and thyroglobulin bodies on July 30, 2023 were undetectable. He presents with repeat thyroid  function test consistent with appropriate replacement.  He remains on levothyroxine  175 mcg p.o. daily before breakfast.   - He also has hypertension, type 2 diabetes, currently on metformin .  He did not tolerate Jardiance  which he has discontinued after receiving the skin rash.  His point-of-care A1c 7.2%.    He is currently on metformin  1000 mg p.o. twice daily.   His recent labs showed hypogonadism, wishes to avoid testosterone  for now.     Review of Systems  Review of systems  Constitutional: + Minimally fluctuating body weight,  current  Body mass index is 39.83 kg/m. , no fatigue, no subjective hyperthermia, no subjective hypothermia    Objective:    BP 120/86   Pulse 72   Ht 5' 10 (1.778 m)   Wt 277 lb 9.6 oz (125.9 kg)   BMI 39.83 kg/m   Wt Readings from Last 3 Encounters:  12/03/23 277 lb 9.6 oz (125.9 kg)  11/26/23 278 lb 4.8 oz (126.2 kg)  08/13/23 273 lb 6.4 oz (124 kg)       Diabetic Labs (most recent): Lab Results  Component Value Date   HGBA1C 7.1 (H) 06/04/2023   HGBA1C 7.4 (H) 03/02/2023   HGBA1C 7.2 (H) 02/18/2023   MICROALBUR 29.5 02/12/2020    Lipid Panel     Component Value Date/Time   CHOL 137 07/30/2023 1302   TRIG 148 07/30/2023 1302   HDL 28 (L) 07/30/2023 1302   CHOLHDL 4.9 07/30/2023 1302   LDLCALC 83 07/30/2023 1302   LABVLDL 26 07/30/2023 1302      Latest Ref Rng & Units 07/30/2023    1:02 PM 06/20/2023    3:13 PM 06/04/2023    2:42 PM  CMP  Glucose 70 - 99 mg/dL 782  956  213   BUN 6 - 24 mg/dL 7  10  15    Creatinine 0.76 - 1.27 mg/dL 0.86  5.78  4.69   Sodium 134 - 144 mmol/L 142  137  140  Potassium 3.5 - 5.2 mmol/L 4.5  3.7  4.2   Chloride 96  - 106 mmol/L 107  104  101   CO2 20 - 29 mmol/L 21  21  20    Calcium  8.7 - 10.2 mg/dL 9.3  9.3  04.5   Total Protein 6.0 - 8.5 g/dL 6.6   7.6   Total Bilirubin 0.0 - 1.2 mg/dL 0.5   0.2   Alkaline Phos 44 - 121 IU/L 87   96   AST 0 - 40 IU/L 18   17   ALT 0 - 44 IU/L 34   25     Assessment & Plan:    1. Postsurgical hypothyroidism  His recent thyroid  function tests are consistent with appropriate replacement.  He is advised to continue  levothyroxine  to 175 mcg p.o. daily before breakfast.     - We discussed about the correct intake of his thyroid  hormone, on empty stomach at fasting, with water , separated by at least 30 minutes from breakfast and other medications,  and separated by more than 4 hours from calcium , iron , multivitamins, acid reflux medications (PPIs). -Patient is made aware of the fact that thyroid  hormone replacement is needed for life, dose to be adjusted by periodic monitoring of thyroid  function tests.   3. Malignant neoplasm of thyroid  gland (HCC)  -  03/03/2013:  Status post  near total thyroidectomy for stage 1 multifocal FVPTC. pT1NxMx . 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 - After his surgery, he received  of I-131 remnant ablation on 05/05/13,  post therapy whole body scar showing no evidence of distant metastasis.  - surveillance ultrasound of the neck/thyroid  in February 2016 was negative for thyroid  remnants.  - whole-body scan with Thyrogen  was negative for tumor recurrence on 07/05/2015.  -Another surveillance  thyroid /neck ultrasound on 06/09/2016 is remarkable for surgically absent thyroid .  - on October 04, 2017 he underwent another surveillance Thyrogen  stimulated  whole-body scan with negative findings for tumor recurrence or distant metastasis. -His previous labs show thyroglobulin level  undetectable (< 2) -Previsit surveillance thyroid /neck ultrasound negative for  residual thyroid  tissue or recurrence or lymphadenopathy.  -September 2022 Thyrogen  stimulated whole-body scan is unremarkable and favorable for tumor remission.   -His previsit labs continue to show undetectable thyroglobulin and thyroglobulin antibodies.  He will not be considered for imaging studies at this time.     3.  Type 2 diabetes: Point-of-care A1c at 7.2%.  He did not continue on Jardiance  which he thought caused skin rash.  He is advised to continue metformin  1000 g p.o. twice daily.  Side effects and precautions discussed with him.   He will be considered for glipizide 5 mg p.o. daily if A1c still above 7% by next visit. I had a long discussion with this patient on potential complications of uncontrolled diabetes.  His diabetes so far is complicated by obesity/sedentary life and patient remains at exceedingly high risk for complications including    CAD, CVA, CKD, retinopathy, and neuropathy. These are all discussed in detail with him.  - I have counseled him on diet management and weight loss, by adopting whole food plant strong diet. - he acknowledges that there is a room for improvement in his food and drink choices. - Suggestion is made for him to avoid simple carbohydrates  from his diet including Cakes, Sweet Desserts, Ice Cream, Soda (  diet and regular), Sweet Tea, Candies, Chips, Cookies, Store Bought Juices, Alcohol in Excess of  1-2 drinks a day, Artificial Sweeteners,  Coffee Creamer, and Sugar-free Products, Lemonade. This will help patient to have more stable blood glucose profile and potentially avoid unintended weight gain.   - I encouraged him to switch to  unprocessed or minimally processed complex starch and increased protein intake (animal or plant source), fruits, and vegetables.  -In light of his history of thyroid  malignancy, he is not an optimal candidate for GLP-1 receptor  agonists nor DPP 4 inhibitors.   4.  Hypertension: Controlled.  -He is currently on amlodipine  10 Respaire daily, carvedilol  25 mg p.o. twice daily, Entresto  97-103 mg p.o. twice daily.  5) hyperlipidemia-uncontrolled lipid panel with LDL of 119 and triglycerides of 167.   He is advised to continue pravastatin  40 g p.o. nightly.      Admittedly, he has been hesitant to take this medication based on his research of side effects.  he absolutely needs it unless his engagement with whole food plant-based diet is optimal.     6) hypogonadism: His recent labs show total testosterone  of 205, improving . he is not a suitable candidate for androgen replacement therapy at this time given his history of sleep apnea, hyperlipidemia.  He wishes to delay any consideration of androgen replacement therapy  until he works on his lifestyle.   - Patient specific target  A1c;  LDL, HDL, Triglycerides, and  Waist Circumference were discussed in detail.   - I advised patient to maintain close follow up with Rakes, Georgeann Kindred, FNP for primary care needs.   I spent  26  minutes in the care of the patient today including review of labs from Thyroid  Function, CMP, and other relevant labs ; imaging/biopsy records (current and previous including abstractions from other facilities); face-to-face time discussing  his lab results and symptoms, medications doses, his options of short and long term treatment based on the latest standards of care / guidelines;   and documenting the encounter.  Marianna Shirk Netter  participated in the discussions, expressed understanding, and voiced agreement with the above plans.  All questions were answered to his satisfaction. he is encouraged to contact clinic should he have any questions or concerns prior to his return visit.    Follow up plan: Return in about 6 months (around 06/03/2024) for Fasting Labs  in AM B4 8, A1c -NV.  Kalvin Orf, MD Phone: [phone]  Fax: [phone]   This note was partially dictated with voice recognition software. Similar sounding words can be transcribed inadequately or may not  be corrected upon review.  12/03/2023, 10:28 AM

## 2023-12-03 NOTE — Progress Notes (Signed)
 Subjective:  Patient ID: Meri Stammer, male    DOB: 06/24/1975, 48 y.o.   MRN: [id number]  Patient Care Team: Galvin Jules, FNP as PCP - General (Family Medicine) Hyland Mailman, MD as Referring Physician (Optometry) Hazle Lites, MD as Consulting Physician (Orthopedic Surgery) Baby Bolt, MD as Consulting Physician (Endocrinology) Georges Kings, MD as Referring Physician (Cardiology) Arlyce Berger, RN as [facility] Care Management Nadean August, NT as Technician   Chief Complaint:  Medical Management of Chronic Issues (6 month follow up )   HPI: CHAYSEN TILLMAN is a 48 y.o. male presenting on 12/03/2023 for Medical Management of Chronic Issues (6 month follow up )   Meri Stammer is a 48 year old male with diabetes, anemia, and hypertension who presents for medication management and follow-up.  His recent endocrinology visit showed an A1c of 7.2, with no changes to his diabetes medications. He continues to manage his diabetes with his current regimen.  He is under the care of oncology for anemia and B12 deficiency, continuing on B12 and iron  supplements. His levels remain slightly lower than desired, with a follow-up scheduled in three months.  Regarding hypertension, his amlodipine  dosage was increased to twice daily. He is unsure about refills. His blood pressure fluctuates, sometimes being too high or too low, and he recalls that in the past, during hot weather and increased physical activity, his blood pressure would drop very low.  He has a history of bilateral hip surgeries and manages well, though stairs remain a challenge. He mentions a potential need for knee surgery in the future, but it is not planned until he turns 52.  He sees cardiology twice a year and had an EKG about a month ago. His last echocardiogram was in September of the previous year, showing an ejection fraction of 55-60%. He is on carvedilol  and Entresto  for  heart failure.  He reports swelling in his feet, particularly in the afternoons, which he attributes to his work and walking. The swelling leaves sock indentations, this started after the recent increase in amlodipine  dosage.   No significant shortness of breath, dizziness, chest pain, leg swelling, or changes in vision. He notes swelling in his feet in the afternoons.          Relevant past medical, surgical, family, and social history reviewed and updated as indicated.  Allergies and medications reviewed and updated. Data reviewed: Chart in Epic.   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   Pneumonia    Postsurgical hypothyroidism    thyroid  cancer   Sleep apnea    Vitamin D  insufficiency     Past Surgical History:  Procedure Laterality Date   CYSTOSCOPY W/ URETERAL STENT PLACEMENT Bilateral 02/25/2023   Procedure: CYSTOSCOPY WITHright  RETROGRADE PYELOGRAM/right URETERAL STENT PLACEMENT and left stone removal;  Surgeon: Erman Hayward, MD;  Location: [facility];  Service: Urology;  Laterality: Bilateral;   CYSTOSCOPY/URETEROSCOPY/HOLMIUM LASER/STENT PLACEMENT Right 03/30/2023   Procedure: CYSTOSCOPY, RIGHT RETROGRADE PYELOGRAM, RIGHT URETEROSCOPY, HOLMIUM LASER LITHOTRIPSY, AND RIGHT URETERAL STENT EXCHANGE;  Surgeon: Valeta Gaudier,  Buddie Carina, MD;  Location: [facility];  Service: Urology;  Laterality: Right;  60 MINUTES   EYE SURGERY Left    cataract removal   FRACTURE SURGERY Left 06/23/1987   ankle   THYROIDECTOMY N/A 03/03/2013   Procedure: TOTAL THYROIDECTOMY;  Surgeon: Beau Bound, MD;  Location: [facility];  Service: General;  Laterality: N/A;   TONSILLECTOMY     TOTAL HIP  ARTHROPLASTY Right 07/28/2021   TOTAL HIP ARTHROPLASTY Left 09/08/2022   Procedure: TOTAL HIP ARTHROPLASTY ANTERIOR APPROACH;  Surgeon: Claiborne Crew, MD;  Location: [facility];  Service: Orthopedics;  Laterality: Left;    Social History   Socioeconomic History   Marital status: Single    Spouse name: Not on file   Number of children: Not on file   Years of education: Not on file   Highest education level: Associate degree: occupational, Scientist, product/process development, or vocational program  Occupational History   Not on file  Tobacco Use   Smoking status: Every Day    Current packs/day: 0.50    Average packs/day: 0.5 packs/day for 10.0 years (5.0 ttl pk-yrs)    Types: Cigarettes   Smokeless tobacco: Former  Building services engineer status: Never Used  Substance and Sexual Activity   Alcohol use: Yes    Comment: occasional   Drug use: No   Sexual activity: Yes    Birth control/protection: None  Other Topics Concern   Not on file  Social History Narrative   Works outside   Social Drivers of [employer] Strain: Low Risk  (11/29/2023)   Overall Financial Resource Strain (CARDIA)    Difficulty of Paying Living Expenses: Not hard at all  Food Insecurity: No Food Insecurity (11/29/2023)   Hunger Vital Sign    Worried About Running Out of Food in the Last Year: Never true    Ran Out of Food in the Last Year: Never true  Transportation Needs: No Transportation Needs (11/29/2023)   PRAPARE - Administrator, Civil Service (Medical): No    Lack of Transportation (Non-Medical): No  Physical Activity: Unknown (11/29/2023)   Exercise Vital Sign    Days of Exercise per Week: 0 days    Minutes of Exercise per Session: Not on file  Stress: No Stress Concern Present (11/29/2023)   [employer] of Occupational Health - Occupational Stress Questionnaire    Feeling of Stress : Only a little  Social Connections: Moderately Integrated (11/29/2023)   Social Connection and Isolation Panel     Frequency of Communication with Friends and Family: More than three times a week    Frequency of Social Gatherings with Friends and Family: More than three times a week    Attends Religious Services: 1 to 4 times per year    Active Member of [employer] or Organizations: No    Attends Banker Meetings: Not on file    Marital Status: Married  Catering manager Violence: Not At Risk (02/25/2023)   Humiliation, Afraid, Rape, and Kick questionnaire    Fear of Current or Ex-Partner: No    Emotionally Abused: No    Physically Abused: No    Sexually Abused: No    Outpatient Encounter Medications as of 12/03/2023  Medication Sig   Accu-Chek Softclix Lancets lancets Test BS morning, noon and at bedtime Dx E11.65   aspirin  EC 81 MG tablet Take 81 mg by mouth daily.   Blood Glucose Monitoring Suppl DEVI 1 each by Does not apply  route in the morning, at noon, and at bedtime. May substitute to any manufacturer covered by patient's insurance.   carvedilol  (COREG ) 25 MG tablet Take 1 tablet (25 mg total) by mouth 2 (two) times daily with a meal.   celecoxib (CELEBREX) 200 MG capsule Take 200 mg by mouth 2 (two) times daily.   colchicine  0.6 MG tablet TAKE 2 TABLETS AT ONSET, THEN 1 TABLET 2 HOURS LATER- A MAXIMUM OF 3 TABLETS IN 24 HOURS (Patient taking differently: Take 0.6-1.2 mg by mouth See admin instructions. TAKE 1.2 mg (2 TABLETS) by mouth AT ONSET OF GOUT FLARE, THEN 0.6 mg (1 TABLET) 2 HOURS LATER AS NEEDED- A MAXIMUM OF 1.8 mg (3 TABLETS IN 24 HOURS))   cyanocobalamin  (VITAMIN B12) 1000 MCG tablet Take 1 tablet (1,000 mcg total) by mouth daily.   ferrous sulfate  325 (65 FE) MG tablet TAKE 325 MG BY MOUTH DAILY.   glucose blood (ACCU-CHEK GUIDE) test strip Test BS morning, noon and at bedtime Dx E11.65   levothyroxine  (SYNTHROID ) 175 MCG tablet Take 1 tablet (175 mcg total) by mouth daily.   magnesium  oxide (MAG-OX) 400 (240 Mg) MG tablet Take 400 mg by mouth at bedtime.   meloxicam  (MOBIC )  15 MG tablet Take 1 tablet (15 mg total) by mouth daily.   metFORMIN  (GLUCOPHAGE ) 1000 MG tablet TAKE 1 TABLET (1,000 MG TOTAL) BY MOUTH TWICE A DAY WITH FOOD   methocarbamol  (ROBAXIN ) 500 MG tablet Take 1 tablet (500 mg total) by mouth every 6 (six) hours as needed for muscle spasms.   mupirocin  ointment (BACTROBAN ) 2 % Apply 1 Application topically 2 (two) times daily as needed (as directed for irritation- affected areas).   nystatin (MYCOSTATIN/NYSTOP) powder Apply 1 Application topically 2 (two) times daily as needed (as directed for irritation- affected areas).   omega-3 acid ethyl esters (LOVAZA ) 1 g capsule TAKE 2 CAPSULES BY MOUTH TWICE A DAY   pravastatin  (PRAVACHOL ) 40 MG tablet TAKE 1 TABLET BY MOUTH EVERY DAY   sacubitril -valsartan  (ENTRESTO ) 97-103 MG Take 1 tablet by mouth 2 (two) times daily.   TURMERIC CURCUMIN PO Take 1,000 mg by mouth 2 (two) times daily.   TYLENOL  500 MG tablet Take 1,000 mg by mouth every 6 (six) hours as needed for mild pain or headache.   valACYclovir  (VALTREX ) 1000 MG tablet Take 1,000 mg by mouth.   [DISCONTINUED] amLODipine  (NORVASC ) 5 MG tablet Take 5 mg by mouth. (Patient taking differently: Take 5 mg by mouth 2 (two) times daily.)   amLODipine  (NORVASC ) 5 MG tablet Take 1 tablet (5 mg total) by mouth 2 (two) times daily.   [DISCONTINUED] empagliflozin  (JARDIANCE ) 10 MG TABS tablet Take 1 tablet (10 mg total) by mouth daily with breakfast.   [DISCONTINUED] naproxen  (NAPROSYN ) 500 MG tablet Take by mouth.   [DISCONTINUED] oxyCODONE  (ROXICODONE ) 5 MG immediate release tablet Take 1 tablet (5 mg total) by mouth every 6 (six) hours as needed for severe pain (pain score 7-10).   [DISCONTINUED] oxyCODONE -acetaminophen  (PERCOCET/ROXICET) 5-325 MG tablet Take 1 tablet by mouth every 4 (four) hours as needed.   No facility-administered encounter medications on file as of 12/03/2023.    Allergies  Allergen Reactions   Isosorbide Other (See Comments)    Lowers  the blood pressure too much    Pertinent ROS per HPI, otherwise unremarkable      Objective:  BP 102/75   Pulse 76   Temp (!) 97.3 F (36.3 C)   Ht 5' 10 (1.778  m)   Wt 278 lb 9.6 oz (126.4 kg)   SpO2 95%   BMI 39.97 kg/m    Wt Readings from Last 3 Encounters:  12/03/23 278 lb 9.6 oz (126.4 kg)  12/03/23 277 lb 9.6 oz (125.9 kg)  11/26/23 278 lb 4.8 oz (126.2 kg)    Physical Exam Vitals and nursing note reviewed.  Constitutional:      General: He is not in acute distress.    Appearance: Normal appearance. He is well-developed and well-groomed. He is morbidly obese. He is not ill-appearing, toxic-appearing or diaphoretic.  HENT:     Head: Normocephalic and atraumatic.     Nose: Nose normal.     Mouth/Throat:     Mouth: Mucous membranes are moist.   Eyes:     Conjunctiva/sclera: Conjunctivae normal.     Pupils: Pupils are equal, round, and reactive to light.   Neck:     Vascular: No carotid bruit.   Cardiovascular:     Rate and Rhythm: Normal rate and regular rhythm.     Heart sounds: Murmur heard.     Systolic murmur is present with a grade of 1/6.  Pulmonary:     Effort: Pulmonary effort is normal.     Breath sounds: Normal breath sounds.   Musculoskeletal:     Cervical back: Neck supple.     Right lower leg: Edema (trace) present.     Left lower leg: Edema (trace) present.   Skin:    General: Skin is warm and dry.     Capillary Refill: Capillary refill takes less than 2 seconds.   Neurological:     General: No focal deficit present.     Mental Status: He is alert and oriented to person, place, and time.     Gait: Gait normal.   Psychiatric:        Mood and Affect: Mood normal.        Behavior: Behavior normal. Behavior is cooperative.        Thought Content: Thought content normal.        Judgment: Judgment normal.      Results for orders placed or performed in visit on 12/03/23  HgB A1c   Collection Time: 12/03/23 10:30 AM  Result Value  Ref Range   Hemoglobin A1C     HbA1c POC (<> result, manual entry)     HbA1c, POC (prediabetic range)     HbA1c, POC (controlled diabetic range) 7.2 (A) 0.0 - 7.0 %       Pertinent labs & imaging results that were available during my care of the patient were reviewed by me and considered in my medical decision making.  Assessment & Plan:  Syris was seen today for medical management of chronic issues.  Diagnoses and all orders for this visit:  Type 2 diabetes mellitus with hyperglycemia, without long-term current use of insulin  (HCC) Followed by endocrinology  Hypertension associated with diabetes (HCC) -     amLODipine  (NORVASC ) 5 MG tablet; Take 1 tablet (5 mg total) by mouth 2 (two) times daily.  Hyperlipidemia associated with type 2 diabetes mellitus (HCC) Continue statin therapy  Morbid obesity (HCC) Diet and exercise encouraged.   Nonischemic cardiomyopathy (HCC) Followed by cardiology   Coronary artery disease involving native coronary artery of native heart without angina pectoris Followed by cardiology   Postsurgical hypothyroidism Followed by endocrinology   Vitamin D  deficiency disease On repletion therapy       Hypertension Experiencing fluctuations in blood  pressure with episodes of both hypertension and hypotension. Increased amlodipine  dosage to twice daily has contributed to lower blood pressure readings. Hypotension noted during hot weather and increased physical activity. - Discontinue amlodipine  if hypotension or symptoms such as weakness, dizziness, or fatigue occur. - Monitor blood pressure regularly and adjust amlodipine  dosage as needed, potentially reducing to once daily if hypotension persists.  Heart Failure Managed with Entresto  and carvedilol , both of which may contribute to hypotension. Advised to discontinue amlodipine  first if hypotension occurs. - Continue Entresto  and carvedilol  as prescribed.  Peripheral Edema Reports pedal edema,  particularly in the afternoons, likely due to amlodipine  and prolonged standing or walking. Amlodipine 's vasodilatory effect contributes to peripheral edema. - Advise sodium restriction and increased water  intake. - Recommend compression stockings to manage edema. - Monitor for worsening edema or symptoms such as dyspnea or increased fatigue.  Type 2 Diabetes Mellitus A1c at 7.2%, indicating suboptimal glycemic control. No changes to diabetes medication regimen by endocrinology. - Continue current diabetes management regimen.  Anemia and Vitamin B12 Deficiency Under oncology care for anemia and B12 deficiency, with ongoing B12 and iron  supplementation. Levels remain slightly below target. - Continue B12 and iron  supplementation. - Follow up with oncology in three months.  General Health Maintenance Scheduled for a physical examination in December, including routine blood work and testosterone  level assessment. Advised to schedule early morning appointment for accurate testosterone  levels. - Schedule physical examination in December with early morning appointment for blood work, including testosterone  levels.          Continue all other maintenance medications.  Follow up plan: Return in about 6 months (around 06/03/2024), or if symptoms worsen or fail to improve, for Annual Physical.   Continue healthy lifestyle choices, including diet (rich in fruits, vegetables, and lean proteins, and low in salt and simple carbohydrates) and exercise (at least 30 minutes of moderate physical activity daily).  Educational handout given for health maintenance   The above assessment and management plan was discussed with the patient. The patient verbalized understanding of and has agreed to the management plan. Patient is aware to call the clinic if they develop any new symptoms or if symptoms persist or worsen. Patient is aware when to return to the clinic for a follow-up visit. Patient educated on  when it is appropriate to go to the emergency department.   Kattie Parrot, FNP-C [facility] [phone]

## 2023-12-06 NOTE — Telephone Encounter (Signed)
 Name from pharmacy: OMEGA-3 ETHYL ESTERS 1 GM CAP        Will file in chart as: omega-3 acid ethyl esters (LOVAZA ) 1 g capsule    Pharmacy comment: Alternative Requested:CAN YOU SEND AN RX FOR VASCEPA INSTEAD? INSURANCE WILL PAY MORE THE VASCEPA. THANK YOU.

## 2023-12-14 ENCOUNTER — Emergency Department (HOSPITAL_BASED_OUTPATIENT_CLINIC_OR_DEPARTMENT_OTHER)

## 2023-12-14 ENCOUNTER — Other Ambulatory Visit: Payer: Self-pay

## 2023-12-14 ENCOUNTER — Encounter (HOSPITAL_BASED_OUTPATIENT_CLINIC_OR_DEPARTMENT_OTHER): Payer: Self-pay | Admitting: Emergency Medicine

## 2023-12-14 ENCOUNTER — Emergency Department (HOSPITAL_BASED_OUTPATIENT_CLINIC_OR_DEPARTMENT_OTHER)
Admission: EM | Admit: 2023-12-14 | Discharge: 2023-12-14 | Disposition: A | Discharge status: Home / Self Care | Attending: Emergency Medicine | Admitting: Emergency Medicine

## 2023-12-14 DIAGNOSIS — Z7982 Long term (current) use of aspirin: Secondary | ICD-10-CM | POA: Insufficient documentation

## 2023-12-14 DIAGNOSIS — R791 Abnormal coagulation profile: Secondary | ICD-10-CM | POA: Diagnosis not present

## 2023-12-14 DIAGNOSIS — Z79899 Other long term (current) drug therapy: Secondary | ICD-10-CM | POA: Diagnosis not present

## 2023-12-14 DIAGNOSIS — Z7984 Long term (current) use of oral hypoglycemic drugs: Secondary | ICD-10-CM | POA: Diagnosis not present

## 2023-12-14 DIAGNOSIS — R0789 Other chest pain: Secondary | ICD-10-CM | POA: Insufficient documentation

## 2023-12-14 DIAGNOSIS — Z85850 Personal history of malignant neoplasm of thyroid: Secondary | ICD-10-CM | POA: Insufficient documentation

## 2023-12-14 DIAGNOSIS — I110 Hypertensive heart disease with heart failure: Secondary | ICD-10-CM | POA: Insufficient documentation

## 2023-12-14 DIAGNOSIS — I509 Heart failure, unspecified: Secondary | ICD-10-CM | POA: Diagnosis not present

## 2023-12-14 DIAGNOSIS — J029 Acute pharyngitis, unspecified: Secondary | ICD-10-CM | POA: Diagnosis not present

## 2023-12-14 LAB — HEPATIC FUNCTION PANEL
ALT: 49 U/L — ABNORMAL HIGH (ref 0–44)
AST: 26 U/L (ref 15–41)
Albumin: 3.9 g/dL (ref 3.5–5.0)
Alkaline Phosphatase: 110 U/L (ref 38–126)
Bilirubin, Direct: 0.1 mg/dL (ref 0.0–0.2)
Indirect Bilirubin: 0.2 mg/dL — ABNORMAL LOW (ref 0.3–0.9)
Total Bilirubin: 0.3 mg/dL (ref 0.0–1.2)
Total Protein: 6.8 g/dL (ref 6.5–8.1)

## 2023-12-14 LAB — LIPASE, BLOOD: Lipase: 66 U/L — ABNORMAL HIGH (ref 11–51)

## 2023-12-14 LAB — BASIC METABOLIC PANEL WITH GFR
Anion gap: 12 (ref 5–15)
BUN: 13 mg/dL (ref 6–20)
CO2: 21 mmol/L — ABNORMAL LOW (ref 22–32)
Calcium: 8.7 mg/dL — ABNORMAL LOW (ref 8.9–10.3)
Chloride: 108 mmol/L (ref 98–111)
Creatinine, Ser: 0.82 mg/dL (ref 0.61–1.24)
GFR, Estimated: >60 mL/min (ref >60)
Glucose, Bld: 200 mg/dL — ABNORMAL HIGH (ref 70–99)
Potassium: 4.2 mmol/L (ref 3.5–5.1)
Sodium: 141 mmol/L (ref 135–145)

## 2023-12-14 LAB — CBC
HCT: 41.3 % (ref 39.0–52.0)
Hemoglobin: 13.8 g/dL (ref 13.0–17.0)
MCH: 29.1 pg (ref 26.0–34.0)
MCHC: 33.4 g/dL (ref 30.0–36.0)
MCV: 87.1 fL (ref 80.0–100.0)
Platelets: 223 10*3/uL (ref 150–400)
RBC: 4.74 MIL/uL (ref 4.22–5.81)
RDW: 12.8 % (ref 11.5–15.5)
WBC: 8.2 10*3/uL (ref 4.0–10.5)
nRBC: 0 % (ref 0.0–0.2)

## 2023-12-14 LAB — TROPONIN T, HIGH SENSITIVITY
Troponin T High Sensitivity: <15 ng/L (ref <19)
Troponin T High Sensitivity: <15 ng/L (ref <19)

## 2023-12-14 LAB — GROUP A STREP BY PCR: Group A Strep by PCR: NOT DETECTED

## 2023-12-14 LAB — D-DIMER, QUANTITATIVE: D-Dimer, Quant: 0.54 ug{FEU}/mL — ABNORMAL HIGH (ref 0.00–0.50)

## 2023-12-14 MED ORDER — DEXAMETHASONE 4 MG PO TABS
10.0000 mg | ORAL_TABLET | Freq: Once | ORAL | Status: AC
  Administered 2023-12-14: 10 mg via ORAL
  Filled 2023-12-14: qty 3

## 2023-12-14 MED ORDER — IOHEXOL 350 MG/ML SOLN
100.0000 mL | Freq: Once | INTRAVENOUS | Status: AC | PRN
  Administered 2023-12-14: 100 mL via INTRAVENOUS

## 2023-12-14 NOTE — ED Provider Notes (Signed)
 [place] [facility] Provider Note   CSN: [id number] Arrival date & time: 12/14/23  1552     Patient presents with: Chest Pain   Tyler [name] is a 48 y.o. male.   Patient here with chest discomfort this morning some sore throat.  Maybe some diarrhea recently but no nausea no current shortness of breath.  Has history of high cholesterol hypertension CHF/cardiomyopathy.  Denies any leg swelling recent surgery or travel.  No blood clot history.  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  Denies any weakness numbness tingling.  No headache no nominal pain.  The history is provided by the patient.       Prior to Admission medications   Medication Sig Start Date End Date Taking? Authorizing Provider  Accu-Chek Softclix Lancets lancets Test BS morning, noon and at bedtime Dx E11.65 03/29/23   Severa Rock CHRISTELLA, FNP  amLODipine  (NORVASC ) 5 MG tablet Take 1 tablet (5 mg total) by mouth 2 (two) times daily. 12/03/23   Severa Rock CHRISTELLA, FNP  aspirin  EC 81 MG tablet Take 81 mg by mouth daily. 12/07/22   [provider]  Blood Glucose Monitoring Suppl DEVI 1 each by Does not apply route in the morning, at noon, and at bedtime. May substitute to any manufacturer covered by patient's insurance. 03/03/23   Severa Rock CHRISTELLA, FNP  carvedilol  (COREG ) 25 MG tablet TAKE 1 TABLET (25 MG TOTAL) BY MOUTH TWICE A DAY WITH MEALS 12/07/23   Nida, Gebreselassie W, MD  celecoxib (CELEBREX) 200 MG capsule Take 200 mg by mouth 2 (two) times daily.    [provider]  colchicine  0.6 MG tablet TAKE 2 TABLETS AT ONSET, THEN 1 TABLET 2 HOURS LATER- A MAXIMUM OF 3 TABLETS IN 24 HOURS Patient taking differently: Take 0.6-1.2 mg by mouth See admin instructions. TAKE 1.2 mg (2 TABLETS) by mouth AT ONSET OF GOUT FLARE, THEN 0.6 mg (1 TABLET) 2 HOURS LATER AS NEEDED- A MAXIMUM OF 1.8 mg (3 TABLETS IN 24 HOURS) 05/21/20   Nida, Gebreselassie W, MD  cyanocobalamin  (VITAMIN B12)  1000 MCG tablet Take 1 tablet (1,000 mcg total) by mouth daily. 07/08/23   Rogers Hai, MD  ferrous sulfate  325 (65 FE) MG tablet TAKE 325 MG BY MOUTH DAILY. 12/06/23   Severa Rock CHRISTELLA, FNP  glucose blood (ACCU-CHEK GUIDE) test strip Test BS morning, noon and at bedtime Dx E11.65 03/29/23   Severa Rock CHRISTELLA, FNP  icosapent Ethyl (VASCEPA) 1 g capsule Take 2 capsules (2 g total) by mouth 2 (two) times daily. 12/06/23   Severa Rock CHRISTELLA, FNP  levothyroxine  (SYNTHROID ) 175 MCG tablet TAKE 1 TABLET BY MOUTH EVERY DAY 12/07/23   Nida, Gebreselassie W, MD  magnesium  oxide (MAG-OX) 400 (240 Mg) MG tablet Take 400 mg by mouth at bedtime.    [provider]  meloxicam  (MOBIC ) 15 MG tablet TAKE 1 TABLET (15 MG TOTAL) BY MOUTH DAILY. 12/06/23   Severa Rock CHRISTELLA, FNP  metFORMIN  (GLUCOPHAGE ) 1000 MG tablet TAKE 1 TABLET (1,000 MG TOTAL) BY MOUTH TWICE A DAY WITH FOOD 06/29/23   Severa Rock CHRISTELLA, FNP  methocarbamol  (ROBAXIN ) 500 MG tablet Take 1 tablet (500 mg total) by mouth every 6 (six) hours as needed for muscle spasms. 09/08/22   Patti Rosina SAUNDERS, PA-C  mupirocin  ointment (BACTROBAN ) 2 % Apply 1 Application topically 2 (two) times daily as needed (as directed for irritation- affected areas).    [provider]  nystatin (MYCOSTATIN/NYSTOP) powder Apply 1 Application topically 2 (two) times daily as needed (as directed for irritation- affected areas).    [provider]  pravastatin  (PRAVACHOL ) 40 MG tablet TAKE 1 TABLET BY MOUTH EVERY DAY 06/23/22   Ijaola, Onyeje M, NP  sacubitril -valsartan  (ENTRESTO ) 97-103 MG Take 1 tablet by mouth 2 (two) times daily.    [provider]  TURMERIC CURCUMIN PO Take 1,000 mg by mouth 2 (two) times daily.    [provider]  TYLENOL  500 MG tablet Take 1,000 mg by mouth every 6 (six) hours as needed for mild pain or headache.    [provider]  valACYclovir  (VALTREX ) 1000 MG tablet Take 1,000 mg by mouth.    [provider]     Allergies: Isosorbide    Review of Systems  Updated Vital Signs BP 114/86   Pulse 70   Temp 98.1 F (36.7 C) (Oral)   Resp 20   Wt 126 kg   SpO2 97%   BMI 39.86 kg/m   Physical Exam Vitals and nursing note reviewed.  Constitutional:      General: He is not in acute distress.    Appearance: He is well-developed. He is not ill-appearing.  HENT:     Head: Normocephalic and atraumatic.   Eyes:     Extraocular Movements: Extraocular movements intact.     Conjunctiva/sclera: Conjunctivae normal.     Pupils: Pupils are equal, round, and reactive to light.    Cardiovascular:     Rate and Rhythm: Normal rate and regular rhythm.     Pulses:          Radial pulses are 2+ on the right side and 2+ on the left side.     Heart sounds: Normal heart sounds. No murmur heard. Pulmonary:     Effort: Pulmonary effort is normal. No respiratory distress.     Breath sounds: Normal breath sounds. No decreased breath sounds, wheezing, rhonchi or rales.  Abdominal:     Palpations: Abdomen is soft.     Tenderness: There is no abdominal tenderness.   Musculoskeletal:        General: No swelling. Normal range of motion.     Cervical back: Normal range of motion and neck supple.     Right lower leg: No edema.     Left lower leg: No edema.   Skin:    General: Skin is warm and dry.     Capillary Refill: Capillary refill takes less than 2 seconds.   Neurological:     General: No focal deficit present.     Mental Status: He is alert.   Psychiatric:        Mood and Affect: Mood normal.     (all labs ordered are listed, but only abnormal results are displayed) Labs Reviewed  BASIC METABOLIC PANEL WITH GFR - Abnormal; Notable for the following components:      Result Value   CO2 21 (*)    Glucose, Bld 200 (*)    Calcium  8.7 (*)    All other components within normal limits  HEPATIC FUNCTION PANEL - Abnormal; Notable for the following components:   ALT 49 (*)    Indirect Bilirubin  0.2 (*)    All other components within normal limits  LIPASE, BLOOD - Abnormal; Notable for the following components:   Lipase 66 (*)    All other components within normal limits  D-DIMER, QUANTITATIVE - Abnormal; Notable for the following components:  D-Dimer, Quant 0.54 (*)    All other components within normal limits  GROUP A STREP BY PCR  CBC  TROPONIN T, HIGH SENSITIVITY  TROPONIN T, HIGH SENSITIVITY    EKG: EKG Interpretation Date/Time:  Tuesday December 14 2023 16:04:33 EDT Ventricular Rate:  73 PR Interval:  202 QRS Duration:  148 QT Interval:  402 QTC Calculation: 442 R Axis:   -41  Text Interpretation: Normal sinus rhythm Left axis deviation Left ventricular hypertrophy with QRS widening ( R in aVL , Cornell product ) Abnormal ECG When compared with ECG of 02-Jun-2023 13:29, PREVIOUS ECG IS PRESENT Confirmed by Ruthe Cornet [id number]) on 12/14/2023 4:05:19 PM  Radiology: CT Angio Chest PE W and/or Wo Contrast Result Date: 12/14/2023 CLINICAL DATA:  Pulmonary embolism (PE) suspected, low to intermediate prob, positive D-dimer EXAM: CT ANGIOGRAPHY CHEST WITH CONTRAST TECHNIQUE: Multidetector CT imaging of the chest was performed using the standard protocol during bolus administration of intravenous contrast. Multiplanar CT image reconstructions and MIPs were obtained to evaluate the vascular anatomy. RADIATION DOSE REDUCTION: This exam was performed according to the departmental dose-optimization program which includes automated exposure control, adjustment of the mA and/or kV according to patient size and/or use of iterative reconstruction technique. CONTRAST:  OMNIPAQUE  IOHEXOL  350 MG/ML SOLN COMPARISON:  December 14, 2023, February 03, 2023 FINDINGS: Pulmonary Embolism: No pulmonary embolism. Cardiovascular: No cardiomegaly or pericardial effusion.The ascending aorta is mildly dilated measuring 4.1 cm. multi-vessel coronary atherosclerosis. Normal variant 2 vessel aortic arch  anatomy with the left common carotid arising from the brachiocephalic artery. Mediastinum/Nodes: No mediastinal mass. No mediastinal, hilar, or axillary lymphadenopathy. Thyroidectomy changes. Lungs/Pleura: The midline trachea and bronchi are patent. No focal airspace consolidation, pleural effusion, or pneumothorax. Musculoskeletal: No acute fracture or destructive bone lesion. Multilevel degenerative disc disease of the spine. Upper Abdomen: No acute abnormality in the partially visualized upper abdomen. Review of the MIP images confirms the above findings. IMPRESSION: 1. No acute intrathoracic abnormality; specifically, no pulmonary embolism, pneumonia, or pleural effusion. 2. Age advanced multi-vessel coronary atherosclerosis. Appropriate risk stratification recommended. 3. The ascending aorta is mildly dilated measuring 4.1 cm. Recommend annual imaging followup by CTA or MRA. This recommendation follows 2010 ACCF/AHA/AATS/ACR/ASA/SCA/SCAI/SIR/STS/SVM Guidelines for the Diagnosis and Management of Patients with Thoracic Aortic Disease. Circulation. 2010; 121: Z733-z630. Aortic aneurysm NOS (ICD10-I71.9) Electronically Signed   By: Rogelia Myers M.D.   On: 12/14/2023 18:17   DG Chest Portable 1 View Result Date: 12/14/2023 CLINICAL DATA:  cp EXAM: PORTABLE CHEST - 1 VIEW COMPARISON:  June 02, 2023 FINDINGS: Elevation of the right hemidiaphragm. No focal airspace consolidation, pleural effusion, or pneumothorax. No cardiomegaly. No acute fracture or destructive lesion. IMPRESSION: No acute cardiopulmonary abnormality. Electronically Signed   By: Rogelia Myers M.D.   On: 12/14/2023 16:36     Procedures   Medications Ordered in the ED  dexamethasone  (DECADRON ) tablet 10 mg (has no administration in time range)  iohexol  (OMNIPAQUE ) 350 MG/ML injection 100 mL (100 mLs Intravenous Contrast Given 12/14/23 1750)                                    Medical Decision Making Amount and/or Complexity  of Data Reviewed Labs: ordered. Radiology: ordered.  Risk Prescription drug management.   Camellia HERO Beckmann is here with chest pain.  Unremarkable vitals.  No fever.  History of cardiomyopathy, thyroid  cancer in remission, high blood pressure.  Overall we will evaluate for ACS PE dehydration electrolyte abnormality anemia infectious process.  Will get CBC CMP troponin D-dimer chest x-ray.  EKG shows sinus rhythm.  No ischemic changes.  He had an echocardiogram done about a year ago that showed some grade 1 diastolic failure but otherwise was unremarkable.  He does not look volume overloaded on exam.  He felt like his throat was really sore when this started.  He is feeling much better now.  Pain started hours ago.  But mostly resolved at this time.  Overall strep test negative.  No signs of abscess or major strep infection or pharyngitis on exam.  Does not have any issues swallowing no trismus.  D-dimer was elevated but CT scan of the chest negative for PE.  Troponin negative x 2.  No significant leukocytosis anemia or electrolyte abnormality otherwise.  CT does show some mild CAD which is known from a CT coronary study done a couple years ago.  Overall symptoms are noncardiac sounding.  He already has known ascending aorta dilation as well.  At this time I think he can follow-up with cardiology closely outpatient was told to return if symptoms worsen or become more intense or especially with exertion.  But most of his complaint is sore throat some diarrhea and congestion in the chest.  I do think this could be a viral process.  Understands return precautions.  Discharged in good condition.  This chart was dictated using voice recognition software.  Despite best efforts to proofread,  errors can occur which can change the documentation meaning.      Final diagnoses:  Atypical chest pain  Sore throat    ED Discharge Orders     None          Ruthe Cornet, DO 12/14/23 1832

## 2023-12-14 NOTE — Discharge Instructions (Addendum)
 Call your audiologist to schedule close outpatient follow-up.  Please return if symptoms worsen especially if you develop symptoms with exertion.  I do think maybe this is a viral process or congestion given some of the recent diarrhea and sore throat you are having.  I treated you with a long-acting steroid to see if this helps.  Recommend increase hydration at home.  Follow-up with your primary care doctor.  Return if symptoms worsen.

## 2023-12-14 NOTE — ED Triage Notes (Signed)
 Upper mid chest pain pressure , choking feeling going to neck and throat , was seen at [facility] , presents with EKG done  at [facility] , abnormal results, Sore throat and shortness of breath .  Denies nausea yet reports diarrhea 2 days ago .

## 2024-01-01 ENCOUNTER — Other Ambulatory Visit: Payer: Self-pay | Admitting: "Endocrinology

## 2024-01-01 ENCOUNTER — Other Ambulatory Visit: Payer: Self-pay | Admitting: Family Medicine

## 2024-01-01 DIAGNOSIS — E119 Type 2 diabetes mellitus without complications: Secondary | ICD-10-CM

## 2024-01-28 ENCOUNTER — Other Ambulatory Visit: Payer: Self-pay | Admitting: "Endocrinology

## 2024-01-28 DIAGNOSIS — C73 Malignant neoplasm of thyroid gland: Secondary | ICD-10-CM

## 2024-01-29 ENCOUNTER — Other Ambulatory Visit: Payer: Self-pay | Admitting: "Endocrinology

## 2024-01-29 DIAGNOSIS — C73 Malignant neoplasm of thyroid gland: Secondary | ICD-10-CM

## 2024-02-17 ENCOUNTER — Other Ambulatory Visit: Payer: Self-pay | Admitting: "Endocrinology

## 2024-02-24 ENCOUNTER — Other Ambulatory Visit (HOSPITAL_COMMUNITY): Payer: Self-pay | Admitting: Nephrology

## 2024-02-24 DIAGNOSIS — N182 Chronic kidney disease, stage 2 (mild): Secondary | ICD-10-CM

## 2024-03-02 ENCOUNTER — Other Ambulatory Visit: Payer: Self-pay | Admitting: Family Medicine

## 2024-03-02 DIAGNOSIS — D508 Other iron deficiency anemias: Secondary | ICD-10-CM

## 2024-03-21 ENCOUNTER — Other Ambulatory Visit: Payer: Self-pay

## 2024-03-21 ENCOUNTER — Encounter (HOSPITAL_BASED_OUTPATIENT_CLINIC_OR_DEPARTMENT_OTHER): Payer: Self-pay | Admitting: Emergency Medicine

## 2024-03-21 ENCOUNTER — Emergency Department (HOSPITAL_BASED_OUTPATIENT_CLINIC_OR_DEPARTMENT_OTHER)
Admission: EM | Admit: 2024-03-21 | Discharge: 2024-03-21 | Disposition: A | Discharge status: Home / Self Care | Attending: Emergency Medicine | Admitting: Emergency Medicine

## 2024-03-21 ENCOUNTER — Emergency Department (HOSPITAL_BASED_OUTPATIENT_CLINIC_OR_DEPARTMENT_OTHER)

## 2024-03-21 DIAGNOSIS — Z79899 Other long term (current) drug therapy: Secondary | ICD-10-CM | POA: Diagnosis not present

## 2024-03-21 DIAGNOSIS — I110 Hypertensive heart disease with heart failure: Secondary | ICD-10-CM | POA: Insufficient documentation

## 2024-03-21 DIAGNOSIS — Z7982 Long term (current) use of aspirin: Secondary | ICD-10-CM | POA: Insufficient documentation

## 2024-03-21 DIAGNOSIS — E119 Type 2 diabetes mellitus without complications: Secondary | ICD-10-CM | POA: Insufficient documentation

## 2024-03-21 DIAGNOSIS — Z7984 Long term (current) use of oral hypoglycemic drugs: Secondary | ICD-10-CM | POA: Insufficient documentation

## 2024-03-21 DIAGNOSIS — N12 Tubulo-interstitial nephritis, not specified as acute or chronic: Secondary | ICD-10-CM | POA: Diagnosis not present

## 2024-03-21 DIAGNOSIS — R1030 Lower abdominal pain, unspecified: Secondary | ICD-10-CM | POA: Diagnosis present

## 2024-03-21 DIAGNOSIS — I509 Heart failure, unspecified: Secondary | ICD-10-CM | POA: Diagnosis not present

## 2024-03-21 DIAGNOSIS — L03311 Cellulitis of abdominal wall: Secondary | ICD-10-CM | POA: Diagnosis not present

## 2024-03-21 LAB — CBC WITH DIFFERENTIAL/PLATELET
Abs Immature Granulocytes: 0.16 K/uL — ABNORMAL HIGH (ref 0.00–0.07)
Basophils Absolute: 0 K/uL (ref 0.0–0.1)
Basophils Relative: 0 %
Eosinophils Absolute: 0.1 K/uL (ref 0.0–0.5)
Eosinophils Relative: 1 %
HCT: 39.6 % (ref 39.0–52.0)
Hemoglobin: 13.6 g/dL (ref 13.0–17.0)
Immature Granulocytes: 1 %
Lymphocytes Relative: 19 %
Lymphs Abs: 3.2 K/uL (ref 0.7–4.0)
MCH: 30.5 pg (ref 26.0–34.0)
MCHC: 34.3 g/dL (ref 30.0–36.0)
MCV: 88.8 fL (ref 80.0–100.0)
Monocytes Absolute: 1.3 K/uL — ABNORMAL HIGH (ref 0.1–1.0)
Monocytes Relative: 8 %
Neutro Abs: 12.1 K/uL — ABNORMAL HIGH (ref 1.7–7.7)
Neutrophils Relative %: 71 %
Platelets: 192 K/uL (ref 150–400)
RBC: 4.46 MIL/uL (ref 4.22–5.81)
RDW: 12.4 % (ref 11.5–15.5)
WBC: 16.9 K/uL — ABNORMAL HIGH (ref 4.0–10.5)
nRBC: 0 % (ref 0.0–0.2)

## 2024-03-21 LAB — URINALYSIS, MICROSCOPIC (REFLEX)

## 2024-03-21 LAB — URINALYSIS, ROUTINE W REFLEX MICROSCOPIC
Glucose, UA: NEGATIVE mg/dL
Ketones, ur: NEGATIVE mg/dL
Leukocytes,Ua: NEGATIVE
Nitrite: POSITIVE — AB
Protein, ur: >=300 mg/dL — AB
Specific Gravity, Urine: 1.025 (ref 1.005–1.030)
pH: 6 (ref 5.0–8.0)

## 2024-03-21 LAB — BASIC METABOLIC PANEL WITH GFR
Anion gap: 13 (ref 5–15)
BUN: 9 mg/dL (ref 6–20)
CO2: 25 mmol/L (ref 22–32)
Calcium: 8.8 mg/dL — ABNORMAL LOW (ref 8.9–10.3)
Chloride: 100 mmol/L (ref 98–111)
Creatinine, Ser: 1.07 mg/dL (ref 0.61–1.24)
GFR, Estimated: >60 mL/min (ref >60)
Glucose, Bld: 137 mg/dL — ABNORMAL HIGH (ref 70–99)
Potassium: 3.7 mmol/L (ref 3.5–5.1)
Sodium: 139 mmol/L (ref 135–145)

## 2024-03-21 MED ORDER — DOXYCYCLINE HYCLATE 100 MG PO TABS
100.0000 mg | ORAL_TABLET | Freq: Once | ORAL | Status: AC
  Administered 2024-03-21: 100 mg via ORAL
  Filled 2024-03-21: qty 1

## 2024-03-21 MED ORDER — SODIUM CHLORIDE 0.9 % IV SOLN
2.0000 g | Freq: Once | INTRAVENOUS | Status: AC
  Administered 2024-03-21: 2 g via INTRAVENOUS
  Filled 2024-03-21: qty 20

## 2024-03-21 MED ORDER — CEFDINIR 300 MG PO CAPS: 300.0000 mg | ORAL_CAPSULE | Freq: Two times a day (BID) | ORAL | 0 refills | Status: AC

## 2024-03-21 MED ORDER — IOHEXOL 300 MG/ML  SOLN
125.0000 mL | Freq: Once | INTRAMUSCULAR | Status: AC | PRN
  Administered 2024-03-21: 125 mL via INTRAVENOUS

## 2024-03-21 MED ORDER — DOXYCYCLINE HYCLATE 100 MG PO CAPS: 100.0000 mg | ORAL_CAPSULE | Freq: Two times a day (BID) | ORAL | 0 refills | Status: AC

## 2024-03-21 MED ORDER — ONDANSETRON 4 MG PO TBDP: 4.0000 mg | ORAL_TABLET | Freq: Three times a day (TID) | ORAL | 0 refills | Status: DC | PRN

## 2024-03-21 NOTE — ED Notes (Signed)
 Patient transported to CT

## 2024-03-21 NOTE — ED Notes (Signed)
Shift report received, assumed care of patient at this time 

## 2024-03-21 NOTE — Discharge Instructions (Addendum)
 You were seen for your urinary tract infection and skin infection in the emergency department.   At home, please take the antibiotics we have prescribed you.  Take Tylenol  and ibuprofen  for any pain.  You can take the Zofran  for any nausea or vomiting that you have  Check your MyChart online for the results of any tests that had not resulted by the time you left the emergency department.   Follow-up with your primary doctor in 3 to 5 days regarding your visit.    Return immediately to the emergency department if you experience any of the following: Fevers, worsening pain, or any other concerning symptoms.    Thank you for visiting our Emergency Department. It was a pleasure taking care of you today.

## 2024-03-21 NOTE — ED Provider Notes (Signed)
 [place] [facility] AT [facility] Provider Note   CSN: [id number] Arrival date & time: 03/21/24  1414     Patient presents with: Abscess and Back Pain   Tyler [name] is a 48 y.o. male.   48 year old male with a history of CHF, HTN, HLD, kidney stones, and diabetes who presents emergency department flank pain and groin pain.  Patient reports over the past few days he has been having right sided flank pain.  Says that it is progressed to his entire back.  Has been having chills with it as well.  No dysuria or frequency.  Noticed that he has had some swelling on the left side of his lower abdomen near where his belt buckle is.  Says that there is a scab there.  Feels like an abscess but has not been able to drain it.       Prior to Admission medications   Medication Sig Start Date End Date Taking? Authorizing Provider  cefdinir (OMNICEF) 300 MG capsule Take 1 capsule (300 mg total) by mouth 2 (two) times daily for 10 days. 03/21/24 03/31/24 Yes Yolande Lamar BROCKS, MD  doxycycline (VIBRAMYCIN) 100 MG capsule Take 1 capsule (100 mg total) by mouth 2 (two) times daily for 10 days. 03/21/24 03/31/24 Yes Yolande Lamar BROCKS, MD  ondansetron  (ZOFRAN -ODT) 4 MG disintegrating tablet Take 1 tablet (4 mg total) by mouth every 8 (eight) hours as needed for nausea or vomiting. 03/21/24  Yes Yolande Lamar BROCKS, MD  Accu-Chek Softclix Lancets lancets Test BS morning, noon and at bedtime Dx E11.65 03/29/23   Tyler Rock CHRISTELLA, FNP  amLODipine  (NORVASC ) 5 MG tablet Take 1 tablet (5 mg total) by mouth 2 (two) times daily. 12/03/23   Tyler Rock CHRISTELLA, FNP  aspirin  EC 81 MG tablet Take 81 mg by mouth daily. 12/07/22   [provider]  Blood Glucose Monitoring Suppl DEVI 1 each by Does not apply route in the morning, at noon, and at bedtime. May substitute to any manufacturer covered by patient's insurance. 03/03/23   Tyler Rock CHRISTELLA, FNP  carvedilol  (COREG ) 25 MG tablet TAKE 1 TABLET (25  MG TOTAL) BY MOUTH TWICE A DAY WITH MEALS 12/07/23   Nida, Gebreselassie W, MD  celecoxib (CELEBREX) 200 MG capsule Take 200 mg by mouth 2 (two) times daily.    [provider]  colchicine  0.6 MG tablet TAKE 2 TABLETS AT ONSET, THEN 1 TABLET 2 HOURS LATER- A MAXIMUM OF 3 TABLETS IN 24 HOURS Patient taking differently: Take 0.6-1.2 mg by mouth See admin instructions. TAKE 1.2 mg (2 TABLETS) by mouth AT ONSET OF GOUT FLARE, THEN 0.6 mg (1 TABLET) 2 HOURS LATER AS NEEDED- A MAXIMUM OF 1.8 mg (3 TABLETS IN 24 HOURS) 05/21/20   Nida, Gebreselassie W, MD  cyanocobalamin  (VITAMIN B12) 1000 MCG tablet Take 1 tablet (1,000 mcg total) by mouth daily. 07/08/23   Rogers Hai, MD  ferrous sulfate  325 (65 FE) MG tablet TAKE 325 MG BY MOUTH DAILY. 03/02/24   Tyler Rock CHRISTELLA, FNP  glucose blood (ACCU-CHEK GUIDE) test strip Test BS morning, noon and at bedtime Dx E11.65 03/29/23   Tyler Rock CHRISTELLA, FNP  icosapent Ethyl (VASCEPA) 1 g capsule Take 2 capsules (2 g total) by mouth 2 (two) times daily. 12/06/23   Tyler Rock CHRISTELLA, FNP  levothyroxine  (SYNTHROID ) 175 MCG tablet TAKE 1 TABLET BY MOUTH EVERY DAY 01/28/24   Nida, Gebreselassie W, MD  magnesium  oxide (MAG-OX) 400 (240 Mg)  MG tablet Take 400 mg by mouth at bedtime.    [provider]  meloxicam  (MOBIC ) 15 MG tablet TAKE 1 TABLET (15 MG TOTAL) BY MOUTH DAILY. 12/06/23   Tyler Rock HERO, FNP  metFORMIN  (GLUCOPHAGE ) 1000 MG tablet TAKE 1 TABLET (1,000 MG TOTAL) BY MOUTH TWICE A DAY WITH FOOD 01/03/24   Tyler Rock HERO, FNP  methocarbamol  (ROBAXIN ) 500 MG tablet Take 1 tablet (500 mg total) by mouth every 6 (six) hours as needed for muscle spasms. 09/08/22   Patti Rosina SAUNDERS, PA-C  mupirocin  ointment (BACTROBAN ) 2 % Apply 1 Application topically 2 (two) times daily as needed (as directed for irritation- affected areas).    [provider]  nystatin (MYCOSTATIN/NYSTOP) powder Apply 1 Application topically 2 (two) times daily as needed (as directed  for irritation- affected areas).    [provider]  pravastatin  (PRAVACHOL ) 40 MG tablet TAKE 1 TABLET BY MOUTH EVERY DAY 06/23/22   Ijaola, Onyeje M, NP  sacubitril -valsartan  (ENTRESTO ) 97-103 MG Take 1 tablet by mouth 2 (two) times daily.    [provider]  TURMERIC CURCUMIN PO Take 1,000 mg by mouth 2 (two) times daily.    [provider]  TYLENOL  500 MG tablet Take 1,000 mg by mouth every 6 (six) hours as needed for mild pain or headache.    [provider]  valACYclovir  (VALTREX ) 1000 MG tablet Take 1,000 mg by mouth.    [provider]    Allergies: Isosorbide    Review of Systems  Updated Vital Signs BP 115/74 (BP Location: Right Arm)   Pulse 86   Temp 98.9 F (37.2 C) (Oral)   Resp 18   Ht 5' 10 (1.778 [name])   Wt 124.7 kg   SpO2 95%   BMI 39.46 kg/[name]   Physical Exam Constitutional:      Appearance: Normal appearance.  Abdominal:     General: There is no distension.     Tenderness: There is no abdominal tenderness. There is no right CVA tenderness, left CVA tenderness or guarding.   Genitourinary:    Comments: Chaperoned by yvette.  Normal penis testicles.  No erythema.  No subcutaneous emphysema palpated. Neurological:     Mental Status: He is alert.     (all labs ordered are listed, but only abnormal results are displayed) Labs Reviewed  URINALYSIS, ROUTINE [name] REFLEX MICROSCOPIC - Abnormal; Notable for the following components:      Result Value   Color, Urine AMBER (*)    Hgb urine dipstick TRACE (*)    Bilirubin Urine SMALL (*)    Protein, ur >=300 (*)    Nitrite POSITIVE (*)    All other components within normal limits  URINALYSIS, MICROSCOPIC (REFLEX) - Abnormal; Notable for the following components:   Bacteria, UA MANY (*)    All other components within normal limits  CBC WITH DIFFERENTIAL/PLATELET - Abnormal; Notable for the following components:   WBC 16.9 (*)    Neutro Abs 12.1 (*)    Monocytes Absolute  1.3 (*)    Abs Immature Granulocytes 0.16 (*)    All other components within normal limits  BASIC METABOLIC PANEL WITH GFR - Abnormal; Notable for the following components:   Glucose, Bld 137 (*)    Calcium  8.8 (*)    All other components within normal limits  URINE CULTURE    EKG: None  Radiology: CT ABDOMEN PELVIS [name] CONTRAST Result Date: 03/21/2024 CLINICAL DATA:  Left groin swelling for 2  days. EXAM: CT ABDOMEN AND PELVIS WITH CONTRAST TECHNIQUE: Multidetector CT imaging of the abdomen and pelvis was performed using the standard protocol following bolus administration of intravenous contrast. RADIATION DOSE REDUCTION: This exam was performed according to the departmental dose-optimization program which includes automated exposure control, adjustment of the mA and/or kV according to patient size and/or use of iterative reconstruction technique. CONTRAST:  OMNIPAQUE  IOHEXOL  300 MG/ML  SOLN COMPARISON:  June 24, 2023 FINDINGS: Lower chest: No acute abnormality. Hepatobiliary: There is diffuse fatty infiltration of the liver parenchyma. No focal liver abnormality is seen. No gallstones, gallbladder wall thickening, or biliary dilatation. Pancreas: Unremarkable. No pancreatic ductal dilatation or surrounding inflammatory changes. Spleen: Normal in size without focal abnormality. Adrenals/Urinary Tract: Adrenal glands are unremarkable. Kidneys are normal, without renal calculi, focal lesion, or hydronephrosis. Bladder is unremarkable and is limited in evaluation secondary to the presence of overlying streak artifact. Stomach/Bowel: Stomach is within normal limits. Appendix appears normal. No evidence of bowel wall thickening, distention, or inflammatory changes. Vascular/Lymphatic: No significant vascular findings are present. No enlarged abdominal or pelvic lymph nodes. Reproductive: Prostate gland is markedly limited in evaluation secondary to overlying streak artifact. Other: Mild cutaneous  thickening and subcutaneous inflammatory fat stranding is seen within the anterior aspect of the lower pelvic wall, near the dorsal aspect of the base of the penis. No abdominopelvic ascites. Musculoskeletal: Bilateral total hip replacements are seen. There is an extensive amount of associated streak artifact with subsequently limited evaluation of the adjacent osseous and soft tissue structures. No acute osseous abnormalities are identified. IMPRESSION: 1. Mild cellulitis involving the anterior aspect of the lower pelvis, near the dorsal aspect of the base of the penis. 2. Hepatic steatosis. 3. Bilateral total hip replacements. Electronically Signed   By: Suzen Dials [name].D.   On: 03/21/2024 19:55     Procedures   Medications Ordered in the ED  iohexol  (OMNIPAQUE ) 300 MG/ML solution 125 mL (125 mLs Intravenous Contrast Given 03/21/24 1854)  cefTRIAXone  (ROCEPHIN ) 2 g in sodium chloride  0.9 % 100 mL IVPB (0 g Intravenous Stopped 03/21/24 2123)  doxycycline (VIBRA-TABS) tablet 100 mg (100 mg Oral Given 03/21/24 2038)                                    Medical Decision Making Amount and/or Complexity of Data Reviewed Labs: ordered. Radiology: ordered.  Risk Prescription drug management.   Tyler [name] is a 48 year old male history of CHF, HTN, HLD, kidney stone, and diabetes who presents emergency department flank pain and groin pain  Initial Ddx:  Pyelonephritis, kidney stone, cystitis, abscess  MDM/Course:  Patient presents to the emergency department with flank pain and groin pain.  Has also been having some chills.  Afebrile here in the emergency department.  Vital signs are overall reassuring.  No CVA tenderness to palpation.  Does have an area of induration on his lower abdomen that appears to be cellulitis.  White blood cell count of 17.  Appears that he has a UTI based on his urinalysis.  Did have a CT abdomen pelvis with contrast which shows cellulitis of his lower abdominal  wall but no kidney stone and no signs of pyelonephritis. Given IV ceftriaxone  and doxy for pyelonephritis and cellulitis. Urine culture sent.  Since he is not septic and well-appearing feel that he is suitable for outpatient treatment and we will give him Omnicef and doxycycline to  take at home.  This patient presents to the ED for concern of complaints listed in HPI, this involves an extensive number of treatment options, and is a complaint that carries with it a high risk of complications and morbidity. Disposition including potential need for admission considered.   Dispo: DC Home. Return precautions discussed including, but not limited to, those listed in the AVS. Allowed pt time to ask questions which were answered fully prior to dc.  Records reviewed Outpatient Clinic Notes The following labs were independently interpreted: Urinalysis and show urinary tract infection I independently reviewed the following imaging with scope of interpretation limited to determining acute life threatening conditions related to emergency care: Chest x-ray and agree with the radiologist interpretation with the following exceptions: none I personally reviewed and interpreted cardiac monitoring: normal sinus rhythm  I personally reviewed and interpreted the pt's EKG: see above for interpretation  I have reviewed the patients home medications and made adjustments as needed  Portions of this note were generated with Dragon dictation software. Dictation errors may occur despite best attempts at proofreading.     Final diagnoses:  Pyelonephritis  Abdominal wall cellulitis    ED Discharge Orders          Ordered    doxycycline (VIBRAMYCIN) 100 MG capsule  2 times daily        03/21/24 2052    cefdinir (OMNICEF) 300 MG capsule  2 times daily        03/21/24 2052    ondansetron  (ZOFRAN -ODT) 4 MG disintegrating tablet  Every 8 hours PRN        03/21/24 2053               Yolande Lamar BROCKS,  MD 03/21/24 2355

## 2024-03-21 NOTE — ED Triage Notes (Signed)
 Pt reports L groin swelling x 2 days, worsening. Hx of abscesses.   Lower back and R flank pain x 2 days.  Denies urinary sx.  Hx of kidney stones, states this pain feels different.

## 2024-03-22 LAB — URINE CULTURE: Culture: NO GROWTH

## 2024-03-24 ENCOUNTER — Ambulatory Visit: Payer: Self-pay

## 2024-03-24 ENCOUNTER — Encounter (HOSPITAL_BASED_OUTPATIENT_CLINIC_OR_DEPARTMENT_OTHER): Payer: Self-pay

## 2024-03-24 ENCOUNTER — Emergency Department (HOSPITAL_BASED_OUTPATIENT_CLINIC_OR_DEPARTMENT_OTHER)
Admission: EM | Admit: 2024-03-24 | Discharge: 2024-03-24 | Disposition: A | Discharge status: Home / Self Care | Attending: Emergency Medicine | Admitting: Emergency Medicine

## 2024-03-24 ENCOUNTER — Other Ambulatory Visit: Payer: Self-pay

## 2024-03-24 ENCOUNTER — Inpatient Hospital Stay: Attending: Hematology

## 2024-03-24 DIAGNOSIS — I2510 Atherosclerotic heart disease of native coronary artery without angina pectoris: Secondary | ICD-10-CM | POA: Insufficient documentation

## 2024-03-24 DIAGNOSIS — I509 Heart failure, unspecified: Secondary | ICD-10-CM | POA: Diagnosis not present

## 2024-03-24 DIAGNOSIS — L03311 Cellulitis of abdominal wall: Secondary | ICD-10-CM | POA: Insufficient documentation

## 2024-03-24 DIAGNOSIS — E119 Type 2 diabetes mellitus without complications: Secondary | ICD-10-CM | POA: Insufficient documentation

## 2024-03-24 DIAGNOSIS — R109 Unspecified abdominal pain: Secondary | ICD-10-CM | POA: Diagnosis present

## 2024-03-24 DIAGNOSIS — E039 Hypothyroidism, unspecified: Secondary | ICD-10-CM | POA: Insufficient documentation

## 2024-03-24 DIAGNOSIS — E538 Deficiency of other specified B group vitamins: Secondary | ICD-10-CM | POA: Insufficient documentation

## 2024-03-24 DIAGNOSIS — D508 Other iron deficiency anemias: Secondary | ICD-10-CM | POA: Diagnosis present

## 2024-03-24 DIAGNOSIS — Z7982 Long term (current) use of aspirin: Secondary | ICD-10-CM | POA: Diagnosis not present

## 2024-03-24 DIAGNOSIS — I110 Hypertensive heart disease with heart failure: Secondary | ICD-10-CM | POA: Insufficient documentation

## 2024-03-24 DIAGNOSIS — Z79899 Other long term (current) drug therapy: Secondary | ICD-10-CM | POA: Insufficient documentation

## 2024-03-24 DIAGNOSIS — Z7984 Long term (current) use of oral hypoglycemic drugs: Secondary | ICD-10-CM | POA: Insufficient documentation

## 2024-03-24 LAB — COMPREHENSIVE METABOLIC PANEL WITH GFR
ALT: 17 U/L (ref 0–44)
AST: 12 U/L — ABNORMAL LOW (ref 15–41)
Albumin: 3.6 g/dL (ref 3.5–5.0)
Alkaline Phosphatase: 84 U/L (ref 38–126)
Anion gap: 11 (ref 5–15)
BUN: 12 mg/dL (ref 6–20)
CO2: 24 mmol/L (ref 22–32)
Calcium: 8.8 mg/dL — ABNORMAL LOW (ref 8.9–10.3)
Chloride: 105 mmol/L (ref 98–111)
Creatinine, Ser: 0.89 mg/dL (ref 0.61–1.24)
GFR, Estimated: >60 mL/min (ref >60)
Glucose, Bld: 294 mg/dL — ABNORMAL HIGH (ref 70–99)
Potassium: 3.6 mmol/L (ref 3.5–5.1)
Sodium: 140 mmol/L (ref 135–145)
Total Bilirubin: 0.2 mg/dL (ref 0.0–1.2)
Total Protein: 6.6 g/dL (ref 6.5–8.1)

## 2024-03-24 LAB — CBC
HCT: 36.7 % — ABNORMAL LOW (ref 39.0–52.0)
Hemoglobin: 12.4 g/dL — ABNORMAL LOW (ref 13.0–17.0)
MCH: 30.4 pg (ref 26.0–34.0)
MCHC: 33.8 g/dL (ref 30.0–36.0)
MCV: 90 fL (ref 80.0–100.0)
Platelets: 221 K/uL (ref 150–400)
RBC: 4.08 MIL/uL — ABNORMAL LOW (ref 4.22–5.81)
RDW: 12.2 % (ref 11.5–15.5)
WBC: 8.5 K/uL (ref 4.0–10.5)
nRBC: 0 % (ref 0.0–0.2)

## 2024-03-24 LAB — CBC WITH DIFFERENTIAL/PLATELET
Abs Immature Granulocytes: 0.1 K/uL — ABNORMAL HIGH (ref 0.00–0.07)
Basophils Absolute: 0.1 K/uL (ref 0.0–0.1)
Basophils Relative: 1 %
Eosinophils Absolute: 0.3 K/uL (ref 0.0–0.5)
Eosinophils Relative: 3 %
HCT: 38.4 % — ABNORMAL LOW (ref 39.0–52.0)
Hemoglobin: 12.9 g/dL — ABNORMAL LOW (ref 13.0–17.0)
Immature Granulocytes: 1 %
Lymphocytes Relative: 31 %
Lymphs Abs: 2.9 K/uL (ref 0.7–4.0)
MCH: 30.5 pg (ref 26.0–34.0)
MCHC: 33.6 g/dL (ref 30.0–36.0)
MCV: 90.8 fL (ref 80.0–100.0)
Monocytes Absolute: 0.9 K/uL (ref 0.1–1.0)
Monocytes Relative: 9 %
Neutro Abs: 5.2 K/uL (ref 1.7–7.7)
Neutrophils Relative %: 55 %
Platelets: 219 K/uL (ref 150–400)
RBC: 4.23 MIL/uL (ref 4.22–5.81)
RDW: 12.1 % (ref 11.5–15.5)
WBC: 9.4 K/uL (ref 4.0–10.5)
nRBC: 0 % (ref 0.0–0.2)

## 2024-03-24 LAB — URINALYSIS, ROUTINE W REFLEX MICROSCOPIC
Bilirubin Urine: NEGATIVE
Glucose, UA: NEGATIVE mg/dL
Hgb urine dipstick: NEGATIVE
Ketones, ur: NEGATIVE mg/dL
Leukocytes,Ua: NEGATIVE
Nitrite: NEGATIVE
Protein, ur: 100 mg/dL — AB
Specific Gravity, Urine: >=1.03 (ref 1.005–1.030)
pH: 5.5 (ref 5.0–8.0)

## 2024-03-24 LAB — LIPASE, BLOOD: Lipase: 45 U/L (ref 11–51)

## 2024-03-24 LAB — URINALYSIS, MICROSCOPIC (REFLEX)

## 2024-03-24 LAB — IRON AND TIBC
Iron: 39 ug/dL — ABNORMAL LOW (ref 45–182)
Saturation Ratios: 13 % — ABNORMAL LOW (ref 17.9–39.5)
TIBC: 294 ug/dL (ref 250–450)
UIBC: 256 ug/dL

## 2024-03-24 LAB — FERRITIN: Ferritin: 210 ng/mL (ref 24–336)

## 2024-03-24 LAB — VITAMIN B12: Vitamin B-12: 442 pg/mL (ref 180–914)

## 2024-03-24 NOTE — ED Triage Notes (Signed)
 Pain in left flank has improved since last visit. Pt wants Abscess area lower abdomen rechecked .Lower abdominal area remains red and indurated  Unable to follow up with PCP. Currently taking PO antibiotic

## 2024-03-24 NOTE — Discharge Instructions (Signed)
 Since she already made the appointment with nephrology and keep it.  But definitely would make an appointment to follow-up with your primary care doctor.  Would recommend that you continue both antibiotics.  Will extend your work note.  Return for anything new or worse.  Would continue to wash the cellulitis area with a small area of purulent discharge with soap and water  and apply an antibiotic ointment on the surface

## 2024-03-24 NOTE — ED Provider Notes (Addendum)
 [place] [facility] AT [facility] Provider Note   CSN: [id number] Arrival date & time: 03/24/24  1431     Patient presents with: Abdominal Pain   Tyler [name] is a 48 y.o. male.   Patient was seen in the emergency department September 30.  There was concerns for maybe Pilo because of lots of bacteria in the urine.  And there was also concerns for for abdominal wall cellulitis.  Had CT scan abdomen and pelvis paste on that the kidneys ureters and bladder looked fine.  There was evidence of some cellulitic changes in the suprapubic area.  No evidence of any abscess cavity.  Patient was treated with 2 antibiotics at 1B and doxycycline and the other 1 being Omnicef.  Is still taking both of them.  Was given 10-day course of doxycycline.  Patient's urine culture negative.  No growth from that.  But did have many bacteria in the urinalysis and today shows the same thing.  Patient overall feeling better from the kind of back discomfort and from the urinary type symptoms have resolved.  But still worried about the cellulitis has a small area about the size of a pencil eraser this opened up with some purulent discharge.  Temp here 98.5 pulse 70 respirations 18 blood pressure 121/89.  Oxygen saturation is 96% on room air.  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  Patient is an everyday smoker.  About half a pack a day.       Prior to Admission medications   Medication Sig Start Date End Date Taking? Authorizing Provider  Accu-Chek Softclix Lancets lancets Test BS morning, noon and at bedtime Dx E11.65 03/29/23   Severa Rock CHRISTELLA, FNP  amLODipine  (NORVASC ) 5 MG tablet Take 1 tablet (5 mg total) by mouth 2 (two) times daily. 12/03/23   Severa Rock CHRISTELLA, FNP  aspirin  EC 81 MG tablet Take 81 mg by mouth daily.  12/07/22   [provider]  Blood Glucose Monitoring Suppl DEVI 1 each by Does not apply route in the morning, at noon, and at bedtime. May substitute to any manufacturer covered by patient's insurance. 03/03/23   Severa Rock CHRISTELLA, FNP  carvedilol  (COREG ) 25 MG tablet TAKE 1 TABLET (25 MG TOTAL) BY MOUTH TWICE A DAY WITH MEALS 12/07/23   Nida, Gebreselassie W, MD  cefdinir (OMNICEF) 300 MG capsule Take 1 capsule (300 mg total) by mouth 2 (two) times daily for 10 days. 03/21/24 03/31/24  Yolande Lamar BROCKS, MD  celecoxib (CELEBREX) 200 MG capsule Take 200 mg by mouth 2 (two) times daily.    [provider]  colchicine  0.6 MG tablet TAKE 2 TABLETS AT ONSET, THEN 1 TABLET 2 HOURS LATER- A MAXIMUM OF 3 TABLETS IN 24 HOURS Patient taking differently: Take 0.6-1.2 mg by mouth See admin instructions. TAKE 1.2 mg (2 TABLETS) by mouth AT ONSET OF GOUT FLARE, THEN 0.6 mg (1 TABLET) 2 HOURS LATER AS NEEDED- A MAXIMUM OF 1.8 mg (3 TABLETS IN 24 HOURS) 05/21/20   Nida, Gebreselassie W, MD  cyanocobalamin  (VITAMIN B12) 1000 MCG tablet Take 1 tablet (1,000 mcg total) by mouth daily. 07/08/23   Katragadda, Sreedhar, MD  doxycycline (VIBRAMYCIN) 100 MG capsule Take 1 capsule (100 mg total) by mouth 2 (two) times daily for 10 days. 03/21/24 03/31/24  Yolande Lamar BROCKS, MD  ferrous sulfate  325 (65 FE) MG  tablet TAKE 325 MG BY MOUTH DAILY. 03/02/24   Severa Rock HERO, FNP  glucose blood (ACCU-CHEK GUIDE) test strip Test BS morning, noon and at bedtime Dx E11.65 03/29/23   Severa Rock HERO, FNP  icosapent Ethyl (VASCEPA) 1 g capsule Take 2 capsules (2 g total) by mouth 2 (two) times daily. 12/06/23   Severa Rock HERO, FNP  levothyroxine  (SYNTHROID ) 175 MCG tablet TAKE 1 TABLET BY MOUTH EVERY DAY 01/28/24   Nida, Gebreselassie W, MD  magnesium  oxide (MAG-OX) 400 (240 Mg) MG tablet Take 400 mg by mouth at bedtime.    [provider]  meloxicam  (MOBIC ) 15 MG tablet TAKE 1 TABLET (15 MG TOTAL) BY MOUTH DAILY. 12/06/23    Severa Rock HERO, FNP  metFORMIN  (GLUCOPHAGE ) 1000 MG tablet TAKE 1 TABLET (1,000 MG TOTAL) BY MOUTH TWICE A DAY WITH FOOD 01/03/24   Severa Rock HERO, FNP  methocarbamol  (ROBAXIN ) 500 MG tablet Take 1 tablet (500 mg total) by mouth every 6 (six) hours as needed for muscle spasms. 09/08/22   Patti Rosina SAUNDERS, PA-C  mupirocin  ointment (BACTROBAN ) 2 % Apply 1 Application topically 2 (two) times daily as needed (as directed for irritation- affected areas).    [provider]  nystatin (MYCOSTATIN/NYSTOP) powder Apply 1 Application topically 2 (two) times daily as needed (as directed for irritation- affected areas).    [provider]  ondansetron  (ZOFRAN -ODT) 4 MG disintegrating tablet Take 1 tablet (4 mg total) by mouth every 8 (eight) hours as needed for nausea or vomiting. 03/21/24   Yolande Lamar BROCKS, MD  pravastatin  (PRAVACHOL ) 40 MG tablet TAKE 1 TABLET BY MOUTH EVERY DAY 06/23/22   Ijaola, Onyeje M, NP  sacubitril -valsartan  (ENTRESTO ) 97-103 MG Take 1 tablet by mouth 2 (two) times daily.    [provider]  TURMERIC CURCUMIN PO Take 1,000 mg by mouth 2 (two) times daily.    [provider]  TYLENOL  500 MG tablet Take 1,000 mg by mouth every 6 (six) hours as needed for mild pain or headache.    [provider]  valACYclovir  (VALTREX ) 1000 MG tablet Take 1,000 mg by mouth.    [provider]    Allergies: Isosorbide    Review of Systems  Constitutional:  Negative for chills and fever.  HENT:  Negative for ear pain and sore throat.   Eyes:  Negative for pain and visual disturbance.  Respiratory:  Negative for cough and shortness of breath.   Cardiovascular:  Negative for chest pain and palpitations.  Gastrointestinal:  Negative for abdominal pain, diarrhea, nausea and vomiting.  Genitourinary:  Negative for difficulty urinating, dysuria, flank pain, hematuria, scrotal swelling and testicular pain.  Musculoskeletal:  Negative for arthralgias and  back pain.  Skin:  Positive for wound. Negative for color change and rash.  Neurological:  Negative for seizures and syncope.  All other systems reviewed and are negative.   Updated Vital Signs BP 121/89 (BP Location: Right Arm)   Pulse 70   Temp 98.5 F (36.9 C)   Resp 18   Wt 124.7 kg   SpO2 96%   BMI 39.46 kg/m   Physical Exam Vitals and nursing note reviewed.  Constitutional:      General: He is not in acute distress.    Appearance: Normal appearance. He is well-developed. He is not ill-appearing or toxic-appearing.  HENT:     Head: Normocephalic and atraumatic.     Mouth/Throat:     Mouth: Mucous membranes are moist.  Eyes:     Extraocular Movements: Extraocular movements intact.     Conjunctiva/sclera: Conjunctivae normal.     Pupils: Pupils are equal, round, and reactive to light.  Cardiovascular:     Rate and Rhythm: Normal rate and regular rhythm.     Heart sounds: No murmur heard. Pulmonary:     Effort: Pulmonary effort is normal. No respiratory distress.     Breath sounds: Normal breath sounds.  Abdominal:     Palpations: Abdomen is soft.     Tenderness: There is abdominal tenderness.     Comments: Suprapubic area with an area of induration patient states its improved.  But measures currently about 6 cm.  Some slight erythema.  No fluctuance.  Right in the center is an area measuring about 5 mm that has a little bit of some purulent collection on the top.  No expression of purulence currently.  No swelling in the scrotum or testicle area.  Anterior part of the abdomen is normal.  Musculoskeletal:        General: No swelling.     Cervical back: Normal range of motion and neck supple.  Skin:    General: Skin is warm and dry.     Capillary Refill: Capillary refill takes less than 2 seconds.  Neurological:     General: No focal deficit present.     Mental Status: He is alert and oriented to person, place, and time.  Psychiatric:        Mood and Affect: Mood  normal.     (all labs ordered are listed, but only abnormal results are displayed) Labs Reviewed  COMPREHENSIVE METABOLIC PANEL WITH GFR - Abnormal; Notable for the following components:      Result Value   Glucose, Bld 294 (*)    Calcium  8.8 (*)    AST 12 (*)    All other components within normal limits  CBC - Abnormal; Notable for the following components:   RBC 4.08 (*)    Hemoglobin 12.4 (*)    HCT 36.7 (*)    All other components within normal limits  URINALYSIS, ROUTINE W REFLEX MICROSCOPIC - Abnormal; Notable for the following components:   APPearance HAZY (*)    Protein, ur 100 (*)    All other components within normal limits  URINALYSIS, MICROSCOPIC (REFLEX) - Abnormal; Notable for the following components:   Bacteria, UA MANY (*)    All other components within normal limits  LIPASE, BLOOD    EKG: None  Radiology: No results found.   Procedures   Medications Ordered in the ED - No data to display                                  Medical Decision Making Amount and/or Complexity of Data Reviewed Labs: ordered.   Urinalysis here again once again shows bacteria.  But otherwise not suggestive of urinary tract infection.  RBCs 0-5 white blood cells 0-5.  Area of cellulitis induration no real fluctuance in the suprapubic area.  Seems to be improving according to patient.  Would recommend continuing both antibiotics just to be on the safe side.  Patient nontoxic no acute distress.  Complete metabolic panel lipase is pending.  Tyler need to wait on that prior to discharge white count 8.5 hemoglobin 12.4 platelets 221.  Complete metabolic panel is normal.  Normal renal function normal electrolytes.  And lipase is normal as well.  Also did review his CT scan abdomen and pelvis from previous visit.  There other than the cellulitis in the suprapubic area no evidence of any intra-abdominal abnormalities nothing consistent with urinary tract infection or pyelonephritis  based on CT scan.  Final diagnoses:  Cellulitis of abdominal wall    ED Discharge Orders     None          Geraldene Hamilton, MD 03/24/24 1831    Geraldene Hamilton, MD 03/24/24 [id number]

## 2024-03-24 NOTE — Telephone Encounter (Signed)
 FYI Only or Action Required?: FYI only for provider.  Patient was last seen in primary care on 12/03/2023 by Severa Rock HERO, FNP.  Called Nurse Triage reporting Abscess.  Symptoms began a few days prior to 03/21/2024.  Interventions attempted: Prescription medications: antibiotics and Other: went to the ER 3 days ago.  Symptoms are: back pain better but knot or abscess on groin area open now.  Triage Disposition: See Physician Within 24 Hours  Patient/caregiver understands and will follow disposition?: Yes--patient states he is going back to the ER        Copied from CRM #[id number]. Topic: Clinical - Red Word Triage >> Mar 24, 2024 12:41 PM Tyler [name] wrote: Red Word that prompted transfer to Nurse Triage: Emergency room Tuesday, gave antibiotics. He still has a knot. When he stands up or sits down, he has pain. Pyelonephritis Reason for Disposition  [1] Treatment (antibiotic, I&D, or moist heat) > 24 hours AND [2] symptoms WORSE (e.g., pain, spreading redness, swelling)  Answer Assessment - Initial Assessment Questions Tuesday patient had a baseball sized knot come up right above his groin area and his back/kidney was hurting per patient Advised kidney infection, given IV antibiotics and prescribed antibiotics Left lower abdomen has a knot on there--not as big as it was but still present ER advised patient that on the CT the knot did not show any sac that would need to be drained Back pain is a lot better Antibiotics prescribed---10 days worth Started on Wednesday Pain--6 or 7 with certain movements 4 inches wide and 5 inches long Patient states that his back pain has gotten a lot better and denies any urinary complaints but he states that the knot (possible abscess) on his groin area seems to have possibly opened up some and he is worried about it getting worse Patient is also a diabetic He states the size is slightly smaller but it has hardened and part of it seems to be open  and he would like it checked out He was told at the Emergency Room if it wasn't better by today to call is PCP There are no openings here at his PCP office and with the recommendation to have it evaluated within 24 hours, patient is advised Urgent Care or the Emergency Room at this time and he states that he will go back to the ER to let them evaluate this opened area on his groin. Patient is advised to call us  back if anything changes.  1. APPEARANCE: What does the boil (abscess) look like?      Patient states big knot  2. LOCATION: Where is the boil located?      Groin area 3. NUMBER: How many boils are there?      one 4. SIZE: How big is the boil? (e.g., inches, cm; compare to size of a coin or other object)     4 inches wide and 5 inches long 6. PAIN: Is there any pain? If Yes, ask: How bad is the pain?  (Scale 1-10; or mild, moderate, severe)     6-7 with certain movements 7. FEVER: Do you have a fever? If Yes, ask: What is it, how was it measured, and when did it start?      Monday feeling cold and hot but no temperature that he knows of 8. TREATMENT: What treatment did you get or are you getting for the boil? (e.g., I&D, antibiotics, moist heat)   IV antibiotics at the ER and prescribed antibiotics  Protocols used: Boil (Skin Abscess) on Treatment Follow-up Call-[facility]

## 2024-03-24 NOTE — Telephone Encounter (Signed)
 Noted

## 2024-03-31 ENCOUNTER — Inpatient Hospital Stay: Admitting: Oncology

## 2024-03-31 DIAGNOSIS — D72829 Elevated white blood cell count, unspecified: Secondary | ICD-10-CM | POA: Insufficient documentation

## 2024-03-31 DIAGNOSIS — D508 Other iron deficiency anemias: Secondary | ICD-10-CM

## 2024-03-31 DIAGNOSIS — E538 Deficiency of other specified B group vitamins: Secondary | ICD-10-CM | POA: Diagnosis not present

## 2024-03-31 DIAGNOSIS — D7219 Other eosinophilia: Secondary | ICD-10-CM | POA: Diagnosis not present

## 2024-03-31 NOTE — Assessment & Plan Note (Signed)
 Patient is currently taking iron  supplements. He denies any GI bleed, melena or hematochezia. Denies prior history of colonoscopy or EGD. Labs from 03/24/2024 show ferritin of 210, iron  saturation 13% within normal TIBC. We discussed iron  tablets daily with vitamin C or orange juice and will recheck labs in 6 weeks to make sure he is not dropping.  Hemoglobin is slightly lower at 12.9.

## 2024-03-31 NOTE — Progress Notes (Signed)
 [facility] OFFICE PROGRESS NOTE  Tyler [name] HERO, FNP  I connected with Tyler [name] on 03/31/24 at  2:15 PM EDT by telephone visit and verified that I am speaking with the correct person using two identifiers.   I discussed the limitations, risks, security and privacy concerns of performing an evaluation and management service by telemedicine and the availability of in-person appointments. I also discussed with the patient that there may be a patient responsible charge related to this service. The patient expressed understanding and agreed to proceed.   Other persons participating in the visit and their role in the encounter: NP, Patient    Patient's location: Home  Provider's location: Clinic    ASSESSMENT & PLAN:    Assessment & Plan Vitamin B12 deficiency 1.  B12 deficiency:  He received 1 B12 injection on 07/08/2023.   Recommend continuing B12 1 mg tablets daily. Recheck labs in 6 weeks. Other eosinophilia This has essentially resolved.  He is currently on antibiotics for a kidney infection.  Recheck in 6 weeks. Other iron  deficiency anemia Patient is currently taking iron  supplements. He denies any GI bleed, melena or hematochezia. Denies prior history of colonoscopy or EGD. Labs from 03/24/2024 show ferritin of 210, iron  saturation 13% within normal TIBC. We discussed iron  tablets daily with vitamin C or orange juice and will recheck labs in 6 weeks to make sure he is not dropping.  Hemoglobin is slightly lower at 12.9.  Orders Placed This Encounter  Procedures   Ferritin    Standing Status:   Future    Expected Date:   05/01/2024    Expiration Date:   07/30/2024   Iron  and TIBC ([facility] ONLY)    Standing Status:   Future    Expected Date:   05/01/2024    Expiration Date:   07/30/2024   Vitamin B12    Standing Status:   Future    Expected Date:   05/01/2024    Expiration Date:   07/30/2024   CBC with Differential/Platelet    Standing  Status:   Future    Expected Date:   05/01/2024    Expiration Date:   07/30/2024   Methylmalonic acid, serum    Standing Status:   Future    Expected Date:   05/01/2024    Expiration Date:   07/30/2024   Vitamin D  25 hydroxy    Standing Status:   Future    Expected Date:   05/12/2024    Expiration Date:   03/31/2025    INTERVAL HISTORY: Patient returns for follow-up.  Reports he has been taking his iron  and B12 supplements as prescribed.  Appetite and energy levels are 100%.  Denies any pain.  He was recently seen in the ED for kidney infection after presenting for abdominal pain.  He will finish his antibiotics in the next couple of days.  Feels like his symptoms have improved.  Denies any hematuria.  He denies any GI bleed, melena or hematochezia.  He has not had an colonoscopy or EGD.  We reviewed CBC, CMP, iron  panel, ferritin, B12, MMA  SUMMARY OF HEMATOLOGIC HISTORY: Oncology History Overview Note  1.  B12 deficiency: - Patient seen at the request of Tyler Asa, FNP. - Labs on 06/04/2023: Hb-14.8, ferritin-216, percent saturation 6, B12 145, folic acid  normal.  TIBC was 340.  Creatinine was 1.16. - He had a history of kidney failure in September from renal stones. - Denies BRBPR/melena.  He  is not on oral iron  supplements.  Denies any tingling or numbness in the extremities.  No B symptoms. - Ambulation is limited from right knee problem.  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 Lenis. - He also has intermittent leukocytosis, but had recent skin infection in the groin region and required antibiotics.   2. Social/Family History: -Lives at home with wife. Currently does not work due to torn meniscus of right knee. Tobacco use of 5 packs per week since age 40.  -Mother had lupus. Maternal grandmother had thyroid  cancer. Father died of cancer, type unknown.     No history exists.     CBC    Component Value Date/Time   WBC 8.5  03/24/2024 1757   RBC 4.08 (L) 03/24/2024 1757   HGB 12.4 (L) 03/24/2024 1757   HGB 13.8 07/30/2023 1302   HCT 36.7 (L) 03/24/2024 1757   HCT 41.8 07/30/2023 1302   PLT 221 03/24/2024 1757   PLT 236 07/30/2023 1302   MCV 90.0 03/24/2024 1757   MCV 89 07/30/2023 1302   MCH 30.4 03/24/2024 1757   MCHC 33.8 03/24/2024 1757   RDW 12.2 03/24/2024 1757   RDW 13.1 07/30/2023 1302   LYMPHSABS 2.9 03/24/2024 0844   LYMPHSABS 2.8 07/30/2023 1302   MONOABS 0.9 03/24/2024 0844   EOSABS 0.3 03/24/2024 0844   EOSABS 0.1 07/30/2023 1302   BASOSABS 0.1 03/24/2024 0844   BASOSABS 0.0 07/30/2023 1302       Latest Ref Rng & Units 03/24/2024    5:57 PM 03/21/2024    5:16 PM 12/14/2023    4:17 PM  CMP  Glucose 70 - 99 mg/dL 705  862  799   BUN 6 - 20 mg/dL 12  9  13    Creatinine 0.61 - 1.24 mg/dL 9.10  8.92  9.17   Sodium 135 - 145 mmol/L 140  139  141   Potassium 3.5 - 5.1 mmol/L 3.6  3.7  4.2   Chloride 98 - 111 mmol/L 105  100  108   CO2 22 - 32 mmol/L 24  25  21    Calcium  8.9 - 10.3 mg/dL 8.8  8.8  8.7   Total Protein 6.5 - 8.1 g/dL 6.6   6.8   Total Bilirubin 0.0 - 1.2 mg/dL 0.2   0.3   Alkaline Phos 38 - 126 U/L 84   110   AST 15 - 41 U/L 12   26   ALT 0 - 44 U/L 17   49      Lab Results  Component Value Date   FERRITIN 210 03/24/2024   VITAMINB12 442 03/24/2024    There were no vitals filed for this visit.  Review of System:  Review of Systems  Gastrointestinal:  Positive for abdominal pain.    Physical Exam: Physical Exam Neurological:     Mental Status: He is alert and oriented to person, place, and time.      I provided 25 minutes of non face-to-face telephone visit time during this encounter, and > 50% was spent counseling as documented under my assessment & plan.  Tyler Hope, NP 03/31/2024 3:16 PM

## 2024-03-31 NOTE — Assessment & Plan Note (Addendum)
 1.  B12 deficiency:  He received 1 B12 injection on 07/08/2023.   Recommend continuing B12 1 mg tablets daily. Recheck labs in 6 weeks.

## 2024-03-31 NOTE — Assessment & Plan Note (Addendum)
 This has essentially resolved.  He is currently on antibiotics for a kidney infection.  Recheck in 6 weeks.

## 2024-04-03 LAB — METHYLMALONIC ACID, SERUM: Methylmalonic Acid, Quantitative: 310 nmol/L (ref 0–378)

## 2024-04-05 ENCOUNTER — Ambulatory Visit: Payer: Self-pay | Admitting: Oncology

## 2024-04-05 ENCOUNTER — Other Ambulatory Visit: Payer: Self-pay | Admitting: Family Medicine

## 2024-04-05 DIAGNOSIS — E119 Type 2 diabetes mellitus without complications: Secondary | ICD-10-CM

## 2024-04-05 NOTE — Progress Notes (Signed)
 Good morning, this patient would benefit from IV iron  if he is willing.  Iron  saturations dropped and he is now anemic. Tomi would you mind seeing if he is willing?  It does not matter which type I guess just let me know which one his insurance covers.  Delon Hope, NP 04/05/2024 8:35 AM

## 2024-04-05 NOTE — Progress Notes (Signed)
 Okay any of those are good with me  Delon Hope, NP 04/05/2024 10:09 AM

## 2024-04-06 ENCOUNTER — Other Ambulatory Visit: Payer: Self-pay | Admitting: Oncology

## 2024-04-06 DIAGNOSIS — D509 Iron deficiency anemia, unspecified: Secondary | ICD-10-CM | POA: Insufficient documentation

## 2024-04-06 DIAGNOSIS — D508 Other iron deficiency anemias: Secondary | ICD-10-CM

## 2024-04-06 NOTE — Progress Notes (Signed)
 Orders are in for him to get 3 doses of IV Venofer.  Delon Hope, NP 04/06/2024 12:14 PM

## 2024-04-06 NOTE — Progress Notes (Signed)
 Lets do Venofer 300 mg x 3 doses.  I will put the orders in.  Delon Hope, NP 04/06/2024 12:13 PM

## 2024-04-11 ENCOUNTER — Other Ambulatory Visit: Payer: Self-pay | Admitting: Oncology

## 2024-04-19 ENCOUNTER — Telehealth: Payer: Self-pay | Admitting: *Deleted

## 2024-04-19 NOTE — Telephone Encounter (Signed)
 Spoke to patient's wife regarding Venofer infusions.  She stated that he cannot take that much time off for a lengthy infusion.  Per Delon Hope, will change to [facility].  Wife states that patient will call back to schedule.

## 2024-04-23 ENCOUNTER — Other Ambulatory Visit: Payer: Self-pay | Admitting: "Endocrinology

## 2024-04-23 DIAGNOSIS — C73 Malignant neoplasm of thyroid gland: Secondary | ICD-10-CM

## 2024-05-09 ENCOUNTER — Inpatient Hospital Stay: Attending: Hematology

## 2024-05-09 DIAGNOSIS — D500 Iron deficiency anemia secondary to blood loss (chronic): Secondary | ICD-10-CM | POA: Insufficient documentation

## 2024-05-09 DIAGNOSIS — E538 Deficiency of other specified B group vitamins: Secondary | ICD-10-CM | POA: Diagnosis not present

## 2024-05-09 DIAGNOSIS — D72829 Elevated white blood cell count, unspecified: Secondary | ICD-10-CM | POA: Insufficient documentation

## 2024-05-09 DIAGNOSIS — E559 Vitamin D deficiency, unspecified: Secondary | ICD-10-CM | POA: Insufficient documentation

## 2024-05-09 DIAGNOSIS — D508 Other iron deficiency anemias: Secondary | ICD-10-CM

## 2024-05-09 LAB — VITAMIN D 25 HYDROXY (VIT D DEFICIENCY, FRACTURES): Vit D, 25-Hydroxy: 64.63 ng/mL (ref 30–100)

## 2024-05-09 LAB — CBC WITH DIFFERENTIAL/PLATELET
Abs Immature Granulocytes: 0.11 K/uL — ABNORMAL HIGH (ref 0.00–0.07)
Basophils Absolute: 0.1 K/uL (ref 0.0–0.1)
Basophils Relative: 1 %
Eosinophils Absolute: 0.2 K/uL (ref 0.0–0.5)
Eosinophils Relative: 2 %
HCT: 42.8 % (ref 39.0–52.0)
Hemoglobin: 14.5 g/dL (ref 13.0–17.0)
Immature Granulocytes: 1 %
Lymphocytes Relative: 40 %
Lymphs Abs: 3.6 K/uL (ref 0.7–4.0)
MCH: 30.1 pg (ref 26.0–34.0)
MCHC: 33.9 g/dL (ref 30.0–36.0)
MCV: 89 fL (ref 80.0–100.0)
Monocytes Absolute: 0.6 K/uL (ref 0.1–1.0)
Monocytes Relative: 7 %
Neutro Abs: 4.4 K/uL (ref 1.7–7.7)
Neutrophils Relative %: 49 %
Platelets: 242 K/uL (ref 150–400)
RBC: 4.81 MIL/uL (ref 4.22–5.81)
RDW: 12 % (ref 11.5–15.5)
WBC: 9 K/uL (ref 4.0–10.5)
nRBC: 0 % (ref 0.0–0.2)

## 2024-05-09 LAB — VITAMIN B12: Vitamin B-12: 286 pg/mL (ref 180–914)

## 2024-05-09 LAB — IRON AND TIBC
Iron: 68 ug/dL (ref 45–182)
Saturation Ratios: 19 % (ref 17.9–39.5)
TIBC: 354 ug/dL (ref 250–450)
UIBC: 286 ug/dL

## 2024-05-09 LAB — FERRITIN: Ferritin: 122 ng/mL (ref 24–336)

## 2024-05-15 LAB — METHYLMALONIC ACID, SERUM: Methylmalonic Acid, Quantitative: 290 nmol/L (ref 0–378)

## 2024-05-16 ENCOUNTER — Inpatient Hospital Stay (HOSPITAL_BASED_OUTPATIENT_CLINIC_OR_DEPARTMENT_OTHER): Admitting: Oncology

## 2024-05-16 DIAGNOSIS — E538 Deficiency of other specified B group vitamins: Secondary | ICD-10-CM

## 2024-05-16 DIAGNOSIS — D500 Iron deficiency anemia secondary to blood loss (chronic): Secondary | ICD-10-CM | POA: Diagnosis not present

## 2024-05-16 DIAGNOSIS — D72829 Elevated white blood cell count, unspecified: Secondary | ICD-10-CM

## 2024-05-16 NOTE — Assessment & Plan Note (Addendum)
 Patient is currently taking iron  supplements. He denies any GI bleed, melena or hematochezia. Denies prior history of colonoscopy or EGD. Labs from 05/09/2024 show improvement of his iron  levels and hemoglobin. Continue iron  tablets with vitamin C daily. Return to clinic in 12 weeks with labs a few days before and telephone visit.

## 2024-05-16 NOTE — Assessment & Plan Note (Addendum)
 He received 1 B12 injection on 07/08/2023.   Recommend continuing B12 1 mg tablets daily. B12 level continues to be low. Patient has not been taking B12 daily but twice per week.  I have encouraged him to take it daily

## 2024-05-16 NOTE — Progress Notes (Signed)
 [facility] OFFICE PROGRESS NOTE  Rakes, Rock HERO, FNP  I connected with Camellia HERO Baron on 05/16/24 at 11:45 AM EST by telephone visit and verified that I am speaking with the correct person using two identifiers.   I discussed the limitations, risks, security and privacy concerns of performing an evaluation and management service by telemedicine and the availability of in-person appointments. I also discussed with the patient that there may be a patient responsible charge related to this service. The patient expressed understanding and agreed to proceed.   Other persons participating in the visit and their role in the encounter: NP, Patient    Patient's location: Home  Provider's location: Clinic    ASSESSMENT & PLAN:  Assessment & Plan Iron  deficiency anemia due to chronic blood loss Patient is currently taking iron  supplements. He denies any GI bleed, melena or hematochezia. Denies prior history of colonoscopy or EGD. Labs from 05/09/2024 show improvement of his iron  levels and hemoglobin. Continue iron  tablets with vitamin C daily. Return to clinic in 12 weeks with labs a few days before and telephone visit. Vitamin B12 deficiency He received 1 B12 injection on 07/08/2023.   Recommend continuing B12 1 mg tablets daily. B12 level continues to be low. Patient has not been taking B12 daily but twice per week.  I have encouraged him to take it daily Leukocytosis, unspecified type This has essentially resolved.  He is currently on antibiotics for a kidney infection.   Orders Placed This Encounter  Procedures   Ferritin    Standing Status:   Future    Expected Date:   08/16/2024    Expiration Date:   11/14/2024   Iron  and TIBC ([facility] ONLY)    Standing Status:   Future    Expected Date:   08/16/2024    Expiration Date:   11/14/2024   Vitamin B12    Standing Status:   Future    Expected Date:   08/16/2024    Expiration Date:   11/14/2024   CBC  with Differential/Platelet    Standing Status:   Future    Expected Date:   08/16/2024    Expiration Date:   11/14/2024   Methylmalonic acid, serum    Standing Status:   Future    Expected Date:   08/16/2024    Expiration Date:   11/14/2024   Vitamin D  25 hydroxy    Standing Status:   Future    Expected Date:   08/16/2024    Expiration Date:   11/14/2024   INTERVAL HISTORY: Patient returns for follow-up.  Reports he has been taking his iron  tablets daily with vitamin C and B12 supplements twice per week.  Appetite and energy levels are 100%.  Denies any pain.  Denies any interval hospitalizations, surgeries or changes to baseline health.  Previously, we have discussed IV iron  versus oral iron .  Patient wanted to hold off on IV iron  and see if oral iron  made a difference in his lab work.  He denies any GI bleed, melena or hematochezia.  He has not had an colonoscopy or EGD.  We reviewed CBC, CMP, iron  panel, ferritin, B12, MMA.  SUMMARY OF HEMATOLOGIC HISTORY: Oncology History Overview Note  1.  B12 deficiency: - Patient seen at the request of Rock Asa, FNP. - Labs on 06/04/2023: Hb-14.8, ferritin-216, percent saturation 6, B12 145, folic acid  normal.  TIBC was 340.  Creatinine was 1.16. - He had a history of kidney failure  in September from renal stones. - Denies BRBPR/melena.  He is not on oral iron  supplements.  Denies any tingling or numbness in the extremities.  No B symptoms. - Ambulation is limited from right knee problem.  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 Lenis. - He also has intermittent leukocytosis, but had recent skin infection in the groin region and required antibiotics.   2. Social/Family History: -Lives at home with wife. Currently does not work due to torn meniscus of right knee. Tobacco use of 5 packs per week since age 44.  -Mother had lupus. Maternal grandmother had thyroid  cancer. Father died of cancer, type  unknown.     No history exists.     CBC    Component Value Date/Time   WBC 9.0 05/09/2024 1316   RBC 4.81 05/09/2024 1316   HGB 14.5 05/09/2024 1316   HGB 13.8 07/30/2023 1302   HCT 42.8 05/09/2024 1316   HCT 41.8 07/30/2023 1302   PLT 242 05/09/2024 1316   PLT 236 07/30/2023 1302   MCV 89.0 05/09/2024 1316   MCV 89 07/30/2023 1302   MCH 30.1 05/09/2024 1316   MCHC 33.9 05/09/2024 1316   RDW 12.0 05/09/2024 1316   RDW 13.1 07/30/2023 1302   LYMPHSABS 3.6 05/09/2024 1316   LYMPHSABS 2.8 07/30/2023 1302   MONOABS 0.6 05/09/2024 1316   EOSABS 0.2 05/09/2024 1316   EOSABS 0.1 07/30/2023 1302   BASOSABS 0.1 05/09/2024 1316   BASOSABS 0.0 07/30/2023 1302       Latest Ref Rng & Units 03/24/2024    5:57 PM 03/21/2024    5:16 PM 12/14/2023    4:17 PM  CMP  Glucose 70 - 99 mg/dL 705  862  799   BUN 6 - 20 mg/dL 12  9  13    Creatinine 0.61 - 1.24 mg/dL 9.10  8.92  9.17   Sodium 135 - 145 mmol/L 140  139  141   Potassium 3.5 - 5.1 mmol/L 3.6  3.7  4.2   Chloride 98 - 111 mmol/L 105  100  108   CO2 22 - 32 mmol/L 24  25  21    Calcium  8.9 - 10.3 mg/dL 8.8  8.8  8.7   Total Protein 6.5 - 8.1 g/dL 6.6   6.8   Total Bilirubin 0.0 - 1.2 mg/dL 0.2   0.3   Alkaline Phos 38 - 126 U/L 84   110   AST 15 - 41 U/L 12   26   ALT 0 - 44 U/L 17   49      Lab Results  Component Value Date   FERRITIN 122 05/09/2024   VITAMINB12 286 05/09/2024    There were no vitals filed for this visit.  Review of System:  Review of Systems  Constitutional:  Negative for malaise/fatigue and weight loss.  All other systems reviewed and are negative.   Physical Exam: Physical Exam Neurological:     Mental Status: He is alert and oriented to person, place, and time.      I provided 25 minutes of non face-to-face telephone visit time during this encounter, and > 50% was spent counseling as documented under my assessment & plan.  Delon Hope, NP 05/16/2024 12:17 PM

## 2024-05-16 NOTE — Assessment & Plan Note (Addendum)
 This has essentially resolved.  He is currently on antibiotics for a kidney infection.

## 2024-05-21 ENCOUNTER — Other Ambulatory Visit: Payer: Self-pay | Admitting: Family Medicine

## 2024-05-21 DIAGNOSIS — E781 Pure hyperglyceridemia: Secondary | ICD-10-CM

## 2024-05-26 ENCOUNTER — Ambulatory Visit (HOSPITAL_COMMUNITY)
Admission: RE | Admit: 2024-05-26 | Discharge: 2024-05-26 | Disposition: A | Source: Ambulatory Visit | Attending: Nephrology | Admitting: Nephrology

## 2024-05-26 DIAGNOSIS — N182 Chronic kidney disease, stage 2 (mild): Secondary | ICD-10-CM

## 2024-05-31 ENCOUNTER — Telehealth: Payer: Self-pay | Admitting: Family Medicine

## 2024-05-31 NOTE — Telephone Encounter (Signed)
 Patient aware he may not needs labs and advised to wait until CPE appointment.

## 2024-05-31 NOTE — Telephone Encounter (Signed)
 Patient has lab appt 12-16 for her appt 12-19 with Rosaline Bruns. Orders needs to be put in.

## 2024-06-02 ENCOUNTER — Ambulatory Visit: Admitting: "Endocrinology

## 2024-06-05 ENCOUNTER — Telehealth: Payer: Self-pay | Admitting: "Endocrinology

## 2024-06-05 DIAGNOSIS — E119 Type 2 diabetes mellitus without complications: Secondary | ICD-10-CM

## 2024-06-05 DIAGNOSIS — F17200 Nicotine dependence, unspecified, uncomplicated: Secondary | ICD-10-CM

## 2024-06-05 DIAGNOSIS — E781 Pure hyperglyceridemia: Secondary | ICD-10-CM

## 2024-06-05 DIAGNOSIS — E890 Postprocedural hypothyroidism: Secondary | ICD-10-CM

## 2024-06-05 DIAGNOSIS — E782 Mixed hyperlipidemia: Secondary | ICD-10-CM

## 2024-06-05 DIAGNOSIS — C73 Malignant neoplasm of thyroid gland: Secondary | ICD-10-CM

## 2024-06-05 DIAGNOSIS — E1165 Type 2 diabetes mellitus with hyperglycemia: Secondary | ICD-10-CM

## 2024-06-05 DIAGNOSIS — E291 Testicular hypofunction: Secondary | ICD-10-CM

## 2024-06-05 DIAGNOSIS — I10 Essential (primary) hypertension: Secondary | ICD-10-CM

## 2024-06-05 DIAGNOSIS — E559 Vitamin D deficiency, unspecified: Secondary | ICD-10-CM

## 2024-06-05 NOTE — Telephone Encounter (Signed)
Pt needs labs updated please

## 2024-06-05 NOTE — Telephone Encounter (Signed)
Labs updated & sent to [facility].

## 2024-06-06 ENCOUNTER — Other Ambulatory Visit: Payer: Self-pay

## 2024-06-06 ENCOUNTER — Telehealth: Payer: Self-pay | Admitting: Family Medicine

## 2024-06-06 NOTE — Telephone Encounter (Signed)
 Copied from CRM #[id number]. Topic: General - Other >> Jun 06, 2024 12:11 PM Joesph B wrote: Reason for CRM: patients wife is calling to see why his medication was denied, VASCEPA. His wife states it was prescribed by Rock Bruns. She said he needs the medication. Please fu with her. [phone].

## 2024-06-06 NOTE — Telephone Encounter (Signed)
 Spoke to patient. He was wondering if you would be willing to send in a script. Its no cost with Rx as opposed to buying over the counter.

## 2024-06-07 ENCOUNTER — Other Ambulatory Visit: Payer: Self-pay | Admitting: Family Medicine

## 2024-06-07 DIAGNOSIS — M2550 Pain in unspecified joint: Secondary | ICD-10-CM

## 2024-06-07 DIAGNOSIS — D508 Other iron deficiency anemias: Secondary | ICD-10-CM

## 2024-06-09 ENCOUNTER — Ambulatory Visit (INDEPENDENT_AMBULATORY_CARE_PROVIDER_SITE_OTHER): Payer: Self-pay | Admitting: Family Medicine

## 2024-06-09 ENCOUNTER — Ambulatory Visit: Payer: Self-pay | Admitting: Family Medicine

## 2024-06-09 ENCOUNTER — Encounter: Payer: Self-pay | Admitting: Family Medicine

## 2024-06-09 VITALS — BP 128/88 | HR 68 | Temp 97.6°F | Ht 70.0 in | Wt 294.4 lb

## 2024-06-09 DIAGNOSIS — R7989 Other specified abnormal findings of blood chemistry: Secondary | ICD-10-CM

## 2024-06-09 DIAGNOSIS — Z0000 Encounter for general adult medical examination without abnormal findings: Secondary | ICD-10-CM

## 2024-06-09 DIAGNOSIS — I428 Other cardiomyopathies: Secondary | ICD-10-CM

## 2024-06-09 DIAGNOSIS — E1159 Type 2 diabetes mellitus with other circulatory complications: Secondary | ICD-10-CM | POA: Diagnosis not present

## 2024-06-09 DIAGNOSIS — K219 Gastro-esophageal reflux disease without esophagitis: Secondary | ICD-10-CM | POA: Diagnosis not present

## 2024-06-09 DIAGNOSIS — E291 Testicular hypofunction: Secondary | ICD-10-CM

## 2024-06-09 DIAGNOSIS — E890 Postprocedural hypothyroidism: Secondary | ICD-10-CM | POA: Diagnosis not present

## 2024-06-09 DIAGNOSIS — Z7984 Long term (current) use of oral hypoglycemic drugs: Secondary | ICD-10-CM

## 2024-06-09 DIAGNOSIS — Z0184 Encounter for antibody response examination: Secondary | ICD-10-CM

## 2024-06-09 DIAGNOSIS — C73 Malignant neoplasm of thyroid gland: Secondary | ICD-10-CM

## 2024-06-09 DIAGNOSIS — I152 Hypertension secondary to endocrine disorders: Secondary | ICD-10-CM | POA: Diagnosis not present

## 2024-06-09 DIAGNOSIS — E1165 Type 2 diabetes mellitus with hyperglycemia: Secondary | ICD-10-CM

## 2024-06-09 DIAGNOSIS — E1169 Type 2 diabetes mellitus with other specified complication: Secondary | ICD-10-CM

## 2024-06-09 DIAGNOSIS — Z0001 Encounter for general adult medical examination with abnormal findings: Secondary | ICD-10-CM

## 2024-06-09 LAB — LIPID PANEL

## 2024-06-09 LAB — BAYER DCA HB A1C WAIVED: HB A1C (BAYER DCA - WAIVED): 7.5 % — ABNORMAL HIGH (ref 4.8–5.6)

## 2024-06-09 MED ORDER — ICOSAPENT ETHYL 1 G PO CAPS: 1.0000 g | ORAL_CAPSULE | Freq: Two times a day (BID) | ORAL | 1 refills | Status: AC

## 2024-06-09 MED ORDER — PANTOPRAZOLE SODIUM 40 MG PO TBEC: 40.0000 mg | DELAYED_RELEASE_TABLET | Freq: Every day | ORAL | 3 refills | Status: AC

## 2024-06-09 NOTE — Progress Notes (Signed)
 "  Complete physical exam  Patient: Tyler [name]   DOB: 05/20/1976   48 y.o. Male  MRN: [id number]  Subjective:    Chief Complaint  Patient presents with   Annual Exam    Tyler [name] is a 48 y.o. male who presents today for a complete physical exam. He reports consuming a general diet. The patient does not participate in regular exercise at present. He generally feels well. He reports sleeping well. He does not have additional problems to discuss today.   Tyler [name] is a 48 year old male who presents for an annual physical exam.  He is under the care of endocrinology and oncology for iron  deficiency and B12 deficiency, with his condition showing improvement. He is scheduled for a follow-up in two months. He also has regular cardiology appointments every six months, with the next one scheduled for next month. His last A1c was between 7.1 and 7.2.  He has a history of thyroid  cancer, which necessitated a thyroidectomy. He is unsure of the specific type of thyroid  cancer. Since the thyroidectomy, he has noticed an increase in his A1c levels.  He is currently taking metformin  for diabetes management and is interested in exploring other options to better control his A1c levels. He is also taking ferrous sulfate  for his iron  deficiency, as he opted not to undergo IV infusions due to scheduling conflicts.  His partner mentioned that his Vascepa  prescription was denied, possibly due to a misunderstanding, and requested a prescription for pantoprazole  to manage morning nausea, which has been effectively managed with over-the-counter omeprazole.  He quit smoking on January 23, 2024, and has gained about 20 pounds since then. He is concerned about his weight gain and is looking to manage it. He has no personal or family history of colorectal problems or cancers and reports no abnormal bowel movements. He completed a Cologuard test in April 2025, which was negative. He visits the eye doctor annually  and has no issues with hearing.       Most recent fall risk assessment:    06/09/2024    2:25 PM  Fall Risk   Falls in the past year? 0  Risk for fall due to : No Fall Risks  Follow up Falls evaluation completed     Most recent depression screenings:    06/09/2024    2:25 PM 05/16/2024   11:45 AM  PHQ 2/9 Scores  PHQ - 2 Score 0 0  PHQ- 9 Score 0     Vision:Not within last year  and Dental: No current dental problems  Patient Active Problem List   Diagnosis Date Noted   IDA (iron  deficiency anemia) 04/06/2024   Leukocytosis 03/31/2024   Aneurysm of ascending aorta without rupture 01/27/2024   Absolute anemia 12/03/2023   Vitamin B12 deficiency 12/03/2023   Osteoarthritis of knee 07/08/2023   Lumbar spondylosis 06/04/2023   Hypogonadism, male 04/09/2023   Hyperlipidemia associated with type 2 diabetes mellitus (HCC) 04/09/2023   Urolithiasis 02/24/2023   Chronic combined systolic and diastolic CHF (congestive heart failure) (HCC) 02/18/2023   S/P total left hip arthroplasty 09/08/2022   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) 08/21/2022   Arthritis of right wrist 04/30/2022   Coronary artery disease involving native coronary artery of native heart without angina pectoris 06/30/2021   Chronic fatigue 12/26/2020   Mild sleep apnea 12/26/2020   Nonischemic cardiomyopathy (HCC) 07/02/2020   Morbid obesity (HCC) 05/28/2020   Vitamin D  deficiency disease 02/14/2020  Type 2 diabetes mellitus with hyperglycemia, without long-term current use of insulin  (HCC) 09/29/2019   Polyarthralgia 07/28/2016   Malignant neoplasm of thyroid  gland (HCC) 04/17/2016   Controlled gout 06/07/2015   Hypertension associated with diabetes (HCC) 03/13/2015   Postsurgical hypothyroidism 02/09/2015   Current smoker 12/21/2012   Past Medical History:  Diagnosis Date   Arthritis    Cardiomyopathy (HCC)    CHF (congestive heart failure) (HCC)    Complication of anesthesia     Woke up during surgery   Coronary artery disease    Diabetes (HCC)    Gout    High cholesterol    Hypertension    Low testosterone  04/17/2021   Malignant neoplasm of thyroid  gland (HCC) 04/17/2016   Pneumonia    Postsurgical hypothyroidism    thyroid  cancer   Sleep apnea    Vitamin D  insufficiency    Past Surgical History:  Procedure Laterality Date   CYSTOSCOPY W/ URETERAL STENT PLACEMENT Bilateral 02/25/2023   Procedure: CYSTOSCOPY WITHright  RETROGRADE PYELOGRAM/right URETERAL STENT PLACEMENT and left stone removal;  Surgeon: Gaston Hamilton, MD;  Location: [facility];  Service: Urology;  Laterality: Bilateral;   CYSTOSCOPY/URETEROSCOPY/HOLMIUM LASER/STENT PLACEMENT Right 03/30/2023   Procedure: CYSTOSCOPY, RIGHT RETROGRADE PYELOGRAM, RIGHT URETEROSCOPY, HOLMIUM LASER LITHOTRIPSY, AND RIGHT URETERAL STENT EXCHANGE;  Surgeon: Elisabeth Valli BIRCH, MD;  Location: [facility];  Service: Urology;  Laterality: Right;  60 MINUTES   EYE SURGERY Left    cataract removal   FRACTURE SURGERY Left 06/23/1987   ankle   THYROIDECTOMY N/A 03/03/2013   Procedure: TOTAL THYROIDECTOMY;  Surgeon: Oneil DELENA Budge, MD;  Location: [facility];  Service: General;  Laterality: N/A;   TONSILLECTOMY     TOTAL HIP ARTHROPLASTY Right 07/28/2021   TOTAL HIP ARTHROPLASTY Left 09/08/2022   Procedure: TOTAL HIP ARTHROPLASTY ANTERIOR APPROACH;  Surgeon: Ernie Cough, MD;  Location: [facility];  Service: Orthopedics;  Laterality: Left;   Social History[1] Social History   Socioeconomic History   Marital status: Married    Spouse name: Not on file   Number of children: Not on file   Years of education: Not on file   Highest education level: Associate degree: occupational, scientist, product/process development, or vocational program  Occupational History   Not on file  Tobacco Use   Smoking status: Every Day    Current packs/day: 0.50    Average packs/day: 0.5 packs/day for 10.0 years (5.0 ttl pk-yrs)    Types: Cigarettes   Smokeless tobacco: Former   Building Services Engineer status: Never Used  Substance and Sexual Activity   Alcohol use: Yes    Comment: occasional   Drug use: No   Sexual activity: Yes    Birth control/protection: None  Other Topics Concern   Not on file  Social History Narrative   Works outside   Social Drivers of Health   Tobacco Use: High Risk (06/09/2024)   Patient History    Smoking Tobacco Use: Every Day    Smokeless Tobacco Use: Former    Passive Exposure: Not on Actuary Strain: Low Risk (11/29/2023)   Overall Financial Resource Strain (CARDIA)    Difficulty of Paying Living Expenses: Not hard at all  Food Insecurity: No Food Insecurity (11/29/2023)   Hunger Vital Sign    Worried About Running Out of Food in the Last Year: Never true    Ran Out of Food in the Last Year: Never true  Transportation Needs: No Transportation Needs (11/29/2023)   PRAPARE -  Administrator, Civil Service (Medical): No    Lack of Transportation (Non-Medical): No  Physical Activity: Unknown (11/29/2023)   Exercise Vital Sign    Days of Exercise per Week: 0 days    Minutes of Exercise per Session: Not on file  Stress: No Stress Concern Present (11/29/2023)   [employer] of Occupational Health - Occupational Stress Questionnaire    Feeling of Stress : Only a little  Social Connections: Moderately Integrated (11/29/2023)   Social Connection and Isolation Panel    Frequency of Communication with Friends and Family: More than three times a week    Frequency of Social Gatherings with Friends and Family: More than three times a week    Attends Religious Services: 1 to 4 times per year    Active Member of [employer] or Organizations: No    Attends Banker Meetings: Not on file    Marital Status: Married  Intimate Partner Violence: Not At Risk (02/25/2023)   Humiliation, Afraid, Rape, and Kick questionnaire    Fear of Current or Ex-Partner: No    Emotionally Abused: No    Physically Abused: No     Sexually Abused: No  Depression (PHQ2-9): Low Risk (06/09/2024)   Depression (PHQ2-9)    PHQ-2 Score: 0  Alcohol Screen: Low Risk (11/29/2023)   Alcohol Screen    Last Alcohol Screening Score (AUDIT): 4  Housing: Low Risk (11/29/2023)   Housing Stability Vital Sign    Unable to Pay for Housing in the Last Year: No    Number of Times Moved in the Last Year: 0    Homeless in the Last Year: No  Utilities: Not At Risk (02/25/2023)   [facility] Utilities    Threatened with loss of utilities: No  Recent Concern: Utilities - At Risk (02/23/2023)   Received from [facility] Utilities    Threatened with loss of utilities: Yes  Health Literacy: Not on file   Family Status  Relation Name Status   Mother  Alive   Father  Alive  No partnership data on file   Family History  Problem Relation Age of Onset   Hypertension Father    Hyperlipidemia Father    Allergies[2]    Patient Care Team: Marki Frede, Rock HERO, FNP as PCP - General (Family Medicine) Ladora Ross Lacy Phebe, MD as Referring Physician (Optometry) Heide Ingle, MD as Consulting Physician (Orthopedic Surgery) Lenis Ethelle ORN, MD as Consulting Physician (Endocrinology) Fredrica Lipoma, MD as Referring Physician (Cardiology) Windle Jonothan SAUNDERS, NT as Technician   Show/hide medication list[3]  ROS per HPI      Objective:     BP 128/88   Pulse 68   Temp 97.6 F (36.4 C)   Ht 5' 10 (1.778 m)   Wt 294 lb 6.4 oz (133.5 kg)   SpO2 96%   BMI 42.24 kg/m  BP Readings from Last 3 Encounters:  06/09/24 128/88  03/24/24 129/81  03/21/24 115/74   Wt Readings from Last 3 Encounters:  06/09/24 294 lb 6.4 oz (133.5 kg)  03/24/24 275 lb (124.7 kg)  03/21/24 275 lb (124.7 kg)   SpO2 Readings from Last 3 Encounters:  06/09/24 96%  03/24/24 98%  03/21/24 95%      Physical Exam Vitals and nursing note reviewed.  Constitutional:      General: He is not in acute distress.    Appearance: Normal appearance. He is  well-developed and well-groomed. He is morbidly obese. He is  not ill-appearing, toxic-appearing or diaphoretic.  HENT:     Head: Normocephalic and atraumatic.     Jaw: There is normal jaw occlusion.     Right Ear: Hearing, tympanic membrane and external ear normal.     Left Ear: Hearing, tympanic membrane, ear canal and external ear normal.     Nose: Nose normal.     Mouth/Throat:     Lips: Pink.     Mouth: Mucous membranes are moist.     Pharynx: Oropharynx is clear. Uvula midline.  Eyes:     General: Lids are normal.     Extraocular Movements: Extraocular movements intact.     Conjunctiva/sclera: Conjunctivae normal.     Pupils: Pupils are equal, round, and reactive to light.  Neck:     Thyroid : No thyroid  mass, thyromegaly or thyroid  tenderness.     Vascular: No carotid bruit or JVD.     Trachea: Trachea and phonation normal.  Cardiovascular:     Rate and Rhythm: Normal rate and regular rhythm.     Chest Wall: PMI is not displaced.     Pulses: Normal pulses.     Heart sounds: Normal heart sounds. No murmur heard.    No friction rub. No gallop.  Pulmonary:     Effort: Pulmonary effort is normal. No respiratory distress.     Breath sounds: Normal breath sounds. No wheezing.  Abdominal:     General: Abdomen is protuberant. Bowel sounds are normal. There is no distension or abdominal bruit.     Palpations: Abdomen is soft. There is no hepatomegaly or splenomegaly.     Tenderness: There is no abdominal tenderness. There is no right CVA tenderness or left CVA tenderness.     Hernia: No hernia is present.  Musculoskeletal:        General: Normal range of motion.     Cervical back: Normal range of motion and neck supple.     Right lower leg: Edema (minimal, nonpitting) present.     Left lower leg: Edema (minimal, nonpitting) present.  Lymphadenopathy:     Cervical: No cervical adenopathy.  Skin:    General: Skin is warm and dry.     Capillary Refill: Capillary refill takes less  than 2 seconds.     Coloration: Skin is not cyanotic, jaundiced or pale.     Findings: No rash.  Neurological:     General: No focal deficit present.     Mental Status: He is alert and oriented to person, place, and time.     Sensory: Sensation is intact.     Motor: Motor function is intact.     Coordination: Coordination is intact.     Gait: Gait is intact.     Deep Tendon Reflexes: Reflexes are normal and symmetric.  Psychiatric:        Attention and Perception: Attention and perception normal.        Mood and Affect: Mood and affect normal.        Speech: Speech normal.        Behavior: Behavior normal. Behavior is cooperative.        Thought Content: Thought content normal.        Cognition and Memory: Cognition and memory normal.        Judgment: Judgment normal.      Last CBC Lab Results  Component Value Date   WBC 9.0 05/09/2024   HGB 14.5 05/09/2024   HCT 42.8 05/09/2024   MCV 89.0 05/09/2024  MCH 30.1 05/09/2024   RDW 12.0 05/09/2024   PLT 242 05/09/2024   Last metabolic panel Lab Results  Component Value Date   GLUCOSE 294 (H) 03/24/2024   NA 140 03/24/2024   K 3.6 03/24/2024   CL 105 03/24/2024   CO2 24 03/24/2024   BUN 12 03/24/2024   CREATININE 0.89 03/24/2024   GFRNONAA >60 03/24/2024   CALCIUM  8.8 (L) 03/24/2024   PHOS 3.9 02/20/2023   PROT 6.6 03/24/2024   ALBUMIN  3.6 03/24/2024   LABGLOB 2.6 07/30/2023   AGRATIO 1.4 09/25/2022   BILITOT 0.2 03/24/2024   ALKPHOS 84 03/24/2024   AST 12 (L) 03/24/2024   ALT 17 03/24/2024   ANIONGAP 11 03/24/2024   Last lipids Lab Results  Component Value Date   CHOL 137 07/30/2023   HDL 28 (L) 07/30/2023   LDLCALC 83 07/30/2023   TRIG 148 07/30/2023   CHOLHDL 4.9 07/30/2023   Last hemoglobin A1c Lab Results  Component Value Date   HGBA1C 7.2 (A) 12/03/2023   Last thyroid  functions Lab Results  Component Value Date   TSH 1.040 11/18/2023   T4TOTAL 10.2 06/04/2023   FREET4 1.53 11/18/2023    Last vitamin D  Lab Results  Component Value Date   VD25OH 64.63 05/09/2024   Last vitamin B12 and Folate Lab Results  Component Value Date   VITAMINB12 286 05/09/2024   FOLATE 5.0 06/04/2023        Assessment & Plan:    Routine Health Maintenance and Physical Exam  Immunization History  Administered Date(s) Administered   Influenza-Unspecified 05/20/2000   Janssen (J&J) SARS-COV-2 Vaccination 04/03/2020   Td 04/08/1999   Tdap 05/05/2020    Health Maintenance  Topic Date Due   Hepatitis B Vaccines 19-59 Average Risk (1 of 3 - 19+ 3-dose series) Never done   Diabetic kidney evaluation - Urine ACR  06/03/2024   OPHTHALMOLOGY EXAM  05/12/2024   HEMOGLOBIN A1C  06/03/2024   COVID-19 Vaccine (2 - Janssen risk series) 06/25/2024 (Originally 05/01/2020)   Influenza Vaccine  09/19/2024 (Originally 01/21/2024)   Pneumococcal Vaccine (1 of 2 - PCV) 12/02/2024 (Originally 03/13/1997)   Diabetic kidney evaluation - eGFR measurement  03/24/2025   Fecal DNA (Cologuard)  10/15/2026   DTaP/Tdap/Td (3 - Td or Tdap) 05/05/2030   Hepatitis C Screening  Completed   HIV Screening  Completed   HPV VACCINES  Aged Out   Meningococcal B Vaccine  Aged Out   FOOT EXAM  Discontinued    Discussed health benefits of physical activity, and encouraged him to engage in regular exercise appropriate for his age and condition.  Problem List Items Addressed This Visit       Cardiovascular and Mediastinum   Hypertension associated with diabetes (HCC)   Relevant Medications   icosapent  Ethyl (VASCEPA ) 1 g capsule   Other Relevant Orders   CBC with Differential/Platelet   CMP14+EGFR   Lipid panel   TSH   T4, Free   Bayer DCA Hb A1c Waived   Nonischemic cardiomyopathy (HCC)   Relevant Medications   icosapent  Ethyl (VASCEPA ) 1 g capsule     Endocrine   Postsurgical hypothyroidism   Relevant Orders   Thyroglobulin Level   Thyroglobulin antibody   Malignant neoplasm of thyroid  gland (HCC)    Relevant Orders   Thyroglobulin Level   Thyroglobulin antibody   Type 2 diabetes mellitus with hyperglycemia, without long-term current use of insulin  (HCC)   Relevant Orders   CBC with Differential/Platelet  CMP14+EGFR   Lipid panel   TSH   T4, Free   Bayer DCA Hb A1c Waived   Microalbumin / creatinine urine ratio   Hypogonadism, male   Relevant Orders   Testosterone ,Free and Total   Hyperlipidemia associated with type 2 diabetes mellitus (HCC)   Relevant Medications   icosapent  Ethyl (VASCEPA ) 1 g capsule     Other   Morbid obesity (HCC)   Relevant Orders   CBC with Differential/Platelet   CMP14+EGFR   Lipid panel   TSH   T4, Free   Bayer DCA Hb A1c Waived   Other Visit Diagnoses       Annual physical exam    -  Primary   Relevant Orders   Hepatitis B surface antibody,qualitative   CBC with Differential/Platelet   CMP14+EGFR   Lipid panel     Immunity status testing       Relevant Orders   Hepatitis B surface antibody,qualitative     Elevated LFTs       Relevant Orders   FIB-4 W/REFLEX TO ELF     Gastroesophageal reflux disease without esophagitis       Relevant Medications   pantoprazole  (PROTONIX ) 40 MG tablet         Type 2 diabetes mellitus with hypertension and hyperglycemia Type 2 diabetes mellitus with recent A1c of 7.1-7.2, indicating suboptimal control. Hypertension is managed by both endocrinology and cardiology. Discussed potential switch from metformin  to GLP-1 receptor agonists like Rybelsus for better glycemic control, heart health, kidney protection, and weight management. Consideration of Doreen as an alternative. Emphasized importance of diet and exercise in managing diabetes. - Discuss with endocrinologist about switching from metformin  to Rybelsus or Farxiga. - Emphasized dietary changes and exercise for diabetes management.  Nonischemic cardiomyopathy Managed by cardiology with regular follow-ups every six months. No new issues  reported. - Continue regular cardiology follow-ups every six months.  Morbid obesity Recent weight gain of 20 pounds after smoking cessation. Discussed potential benefits of GLP-1 receptor agonists for weight management. - Consider GLP-1 receptor agonists for weight management.  Postsurgical hypothyroidism secondary to thyroid  cancer Postsurgical hypothyroidism following thyroidectomy for cancer. No specific type of thyroid  cancer recalled, but no medullary thyroid  cancer or MEN2 syndrome. Discussed potential contraindications of GLP-1 receptor agonists if medullary thyroid  cancer or MEN2 syndrome were present. - Ordered TSH and thyroid  function tests before 9:30 AM. - Discuss with endocrinologist about thyroid  cancer type and potential contraindications for GLP-1 receptor agonists.  Hypogonadism, male Hypogonadism with testosterone  levels to be checked. No current symptoms reported. - Ordered testosterone  level test before 9:30 AM.  Elevated liver function tests Elevated liver function tests with pending fib-4 test to assess liver fibrosis. - Ordered fib-4 test.  Gastroesophageal reflux disease Morning vomiting. Previously managed with over-the-counter omeprazole with good effect. - Prescribed Protonix  for GERD management.  Iron  deficiency anemia Hemoglobin levels are improving with oral iron  supplementation. IV infusions considered but not pursued due to scheduling conflicts. - Continue oral iron  supplementation. - Follow up with hematology in three months.  General Health Maintenance Colon cancer screening completed with negative Cologuard test. Vaccinations up to date except for pneumonia vaccine, recommended due to diabetes. - Consider pneumonia vaccination due to diabetes. - Requested eye exam records from Dr. Jama.       Return in about 1 year (around 06/09/2025) for Annual Physical.     Rosaline Bruns, FNP      [1]  Social History Tobacco Use   Smoking  status:  Every Day    Current packs/day: 0.50    Average packs/day: 0.5 packs/day for 10.0 years (5.0 ttl pk-yrs)    Types: Cigarettes   Smokeless tobacco: Former  Building Services Engineer status: Never Used  Substance Use Topics   Alcohol use: Yes    Comment: occasional   Drug use: No  [2]  Allergies Allergen Reactions   Isosorbide Other (See Comments)    Lowers the blood pressure too much  [3]  Outpatient Medications Prior to Visit  Medication Sig   Accu-Chek Softclix Lancets lancets Test BS morning, noon and at bedtime Dx E11.65   amLODipine  (NORVASC ) 5 MG tablet Take 1 tablet (5 mg total) by mouth 2 (two) times daily.   aspirin  EC 81 MG tablet Take 81 mg by mouth daily.   Blood Glucose Monitoring Suppl DEVI 1 each by Does not apply route in the morning, at noon, and at bedtime. May substitute to any manufacturer covered by patient's insurance.   carvedilol  (COREG ) 25 MG tablet TAKE 1 TABLET (25 MG TOTAL) BY MOUTH TWICE A DAY WITH MEALS   celecoxib (CELEBREX) 200 MG capsule Take 200 mg by mouth 2 (two) times daily.   colchicine  0.6 MG tablet TAKE 2 TABLETS AT ONSET, THEN 1 TABLET 2 HOURS LATER- A MAXIMUM OF 3 TABLETS IN 24 HOURS (Patient taking differently: Take 0.6-1.2 mg by mouth See admin instructions. TAKE 1.2 mg (2 TABLETS) by mouth AT ONSET OF GOUT FLARE, THEN 0.6 mg (1 TABLET) 2 HOURS LATER AS NEEDED- A MAXIMUM OF 1.8 mg (3 TABLETS IN 24 HOURS))   cyanocobalamin  (VITAMIN B12) 1000 MCG tablet Take 1 tablet (1,000 mcg total) by mouth daily.   ferrous sulfate  325 (65 FE) MG tablet TAKE 325 MG BY MOUTH DAILY.   glucose blood (ACCU-CHEK GUIDE) test strip Test BS morning, noon and at bedtime Dx E11.65   levothyroxine  (SYNTHROID ) 175 MCG tablet TAKE 1 TABLET BY MOUTH EVERY DAY   magnesium  oxide (MAG-OX) 400 MG tablet Take by mouth.   meloxicam  (MOBIC ) 15 MG tablet TAKE 1 TABLET (15 MG TOTAL) BY MOUTH DAILY.   metFORMIN  (GLUCOPHAGE ) 1000 MG tablet TAKE 1 TABLET (1,000 MG TOTAL) BY MOUTH TWICE A  DAY WITH FOOD   mupirocin  ointment (BACTROBAN ) 2 % Apply 1 Application topically 2 (two) times daily as needed (as directed for irritation- affected areas).   pravastatin  (PRAVACHOL ) 40 MG tablet TAKE 1 TABLET BY MOUTH EVERY DAY   sacubitril -valsartan  (ENTRESTO ) 97-103 MG Take 1 tablet by mouth 2 (two) times daily.   tadalafil (CIALIS) 5 MG tablet Take 5 mg by mouth daily.   TURMERIC CURCUMIN PO Take 1,000 mg by mouth 2 (two) times daily.   TYLENOL  500 MG tablet Take 1,000 mg by mouth every 6 (six) hours as needed for mild pain or headache.   [DISCONTINUED] valACYclovir  (VALTREX ) 1000 MG tablet Take 1,000 mg by mouth.   No facility-administered medications prior to visit.   "

## 2024-06-10 LAB — FIB-4 W/REFLEX TO ELF
ALT: 44 IU/L (ref 0–44)
AST: 24 IU/L (ref 0–40)
FIB-4 Index: 0.75 (ref 0.00–2.67)
Platelets: 223 x10E3/uL (ref 150–450)

## 2024-06-10 LAB — MICROALBUMIN / CREATININE URINE RATIO
Creatinine, Urine: 179.5 mg/dL
Microalb/Creat Ratio: 274 mg/g{creat} — ABNORMAL HIGH (ref 0–29)
Microalbumin, Urine: 492.5 ug/mL

## 2024-06-16 ENCOUNTER — Other Ambulatory Visit

## 2024-06-16 DIAGNOSIS — E291 Testicular hypofunction: Secondary | ICD-10-CM

## 2024-06-17 LAB — TESTOSTERONE,FREE AND TOTAL
Testosterone, Free: 10.7 pg/mL (ref 6.8–21.5)
Testosterone: 197 ng/dL — ABNORMAL LOW (ref 264–916)

## 2024-06-18 LAB — CBC WITH DIFFERENTIAL/PLATELET
Basophils Absolute: 0.1 x10E3/uL (ref 0.0–0.2)
Basos: 1 %
EOS (ABSOLUTE): 0.3 x10E3/uL (ref 0.0–0.4)
Eos: 3 %
Hematocrit: 44.7 % (ref 37.5–51.0)
Hemoglobin: 14.9 g/dL (ref 13.0–17.7)
Immature Grans (Abs): 0.1 x10E3/uL (ref 0.0–0.1)
Immature Granulocytes: 1 %
Lymphocytes Absolute: 3.4 x10E3/uL — ABNORMAL HIGH (ref 0.7–3.1)
Lymphs: 37 %
MCH: 30 pg (ref 26.6–33.0)
MCHC: 33.3 g/dL (ref 31.5–35.7)
MCV: 90 fL (ref 79–97)
Monocytes Absolute: 0.5 x10E3/uL (ref 0.1–0.9)
Monocytes: 6 %
Neutrophils Absolute: 4.8 x10E3/uL (ref 1.4–7.0)
Neutrophils: 52 %
Platelets: 239 x10E3/uL (ref 150–450)
RBC: 4.96 x10E6/uL (ref 4.14–5.80)
RDW: 12.7 % (ref 11.6–15.4)
WBC: 9.2 x10E3/uL (ref 3.4–10.8)

## 2024-06-18 LAB — CMP14+EGFR
ALT: 44 IU/L (ref 0–44)
AST: 23 IU/L (ref 0–40)
Albumin: 4.5 g/dL (ref 4.1–5.1)
Alkaline Phosphatase: 97 IU/L (ref 47–123)
BUN/Creatinine Ratio: 11 (ref 9–20)
BUN: 10 mg/dL (ref 6–24)
Bilirubin Total: 0.4 mg/dL (ref 0.0–1.2)
CO2: 22 mmol/L (ref 20–29)
Calcium: 9.5 mg/dL (ref 8.7–10.2)
Chloride: 105 mmol/L (ref 96–106)
Creatinine, Ser: 0.94 mg/dL (ref 0.76–1.27)
Globulin, Total: 2.8 g/dL (ref 1.5–4.5)
Glucose: 135 mg/dL — ABNORMAL HIGH (ref 70–99)
Potassium: 4.2 mmol/L (ref 3.5–5.2)
Sodium: 141 mmol/L (ref 134–144)
Total Protein: 7.3 g/dL (ref 6.0–8.5)
eGFR: 101 mL/min/1.73

## 2024-06-18 LAB — TSH: TSH: 1.26 u[IU]/mL (ref 0.450–4.500)

## 2024-06-18 LAB — T4, FREE: Free T4: 1.8 ng/dL — ABNORMAL HIGH (ref 0.82–1.77)

## 2024-06-18 LAB — LIPID PANEL
Chol/HDL Ratio: 5.7 ratio — ABNORMAL HIGH (ref 0.0–5.0)
Cholesterol, Total: 181 mg/dL (ref 100–199)
HDL: 32 mg/dL — ABNORMAL LOW
LDL Chol Calc (NIH): 109 mg/dL — ABNORMAL HIGH (ref 0–99)
Triglycerides: 230 mg/dL — ABNORMAL HIGH (ref 0–149)
VLDL Cholesterol Cal: 40 mg/dL (ref 5–40)

## 2024-06-18 LAB — THYROGLOBULIN ANTIBODY: Thyroglobulin Antibody: <1 [IU]/mL (ref 0.0–0.9)

## 2024-06-18 LAB — HEPATITIS B SURFACE ANTIBODY,QUALITATIVE: Hep B Surface Ab, Qual: NONREACTIVE

## 2024-06-18 LAB — THYROGLOBULIN LEVEL: Thyroglobulin (TG-RIA): <2 ng/mL

## 2024-06-19 ENCOUNTER — Encounter: Payer: Self-pay | Admitting: *Deleted

## 2024-06-21 ENCOUNTER — Encounter: Payer: Self-pay | Admitting: "Endocrinology

## 2024-06-21 ENCOUNTER — Ambulatory Visit: Admitting: "Endocrinology

## 2024-06-21 VITALS — BP 125/87 | HR 75 | Resp 18 | Ht 70.0 in | Wt 285.0 lb

## 2024-06-21 DIAGNOSIS — E1165 Type 2 diabetes mellitus with hyperglycemia: Secondary | ICD-10-CM | POA: Diagnosis not present

## 2024-06-21 DIAGNOSIS — I10 Essential (primary) hypertension: Secondary | ICD-10-CM

## 2024-06-21 DIAGNOSIS — Z6841 Body Mass Index (BMI) 40.0 and over, adult: Secondary | ICD-10-CM | POA: Diagnosis not present

## 2024-06-21 DIAGNOSIS — Z7984 Long term (current) use of oral hypoglycemic drugs: Secondary | ICD-10-CM

## 2024-06-21 DIAGNOSIS — E890 Postprocedural hypothyroidism: Secondary | ICD-10-CM | POA: Diagnosis not present

## 2024-06-21 DIAGNOSIS — C73 Malignant neoplasm of thyroid gland: Secondary | ICD-10-CM

## 2024-06-21 DIAGNOSIS — Z85850 Personal history of malignant neoplasm of thyroid: Secondary | ICD-10-CM | POA: Diagnosis not present

## 2024-06-21 DIAGNOSIS — E782 Mixed hyperlipidemia: Secondary | ICD-10-CM

## 2024-06-21 LAB — POCT GLYCOSYLATED HEMOGLOBIN (HGB A1C): Hemoglobin A1C: 8.4 % — AB (ref 4.0–5.6)

## 2024-06-21 NOTE — Progress Notes (Signed)
 " 06/21/2024     Endocrinology follow-up note   Subjective:    Patient ID: Tyler [name], male    DOB: 01-15-1976, PCP Tyler [name], Tyler CHRISTELLA, FNP   Past Medical History:  Diagnosis Date   Arthritis    Cardiomyopathy [facility])    CHF (congestive heart failure) (HCC)    Complication of anesthesia    Woke up during surgery   Coronary artery disease    Diabetes (HCC)    Gout    High cholesterol    Hypertension    Low testosterone  04/17/2021   Malignant neoplasm of thyroid  gland (HCC) 04/17/2016   Pneumonia    Postsurgical hypothyroidism    thyroid  cancer   Sleep apnea    Vitamin D  insufficiency    Past Surgical History:  Procedure Laterality Date   CYSTOSCOPY W/ URETERAL STENT PLACEMENT Bilateral 02/25/2023   Procedure: CYSTOSCOPY WITHright  RETROGRADE PYELOGRAM/right URETERAL STENT PLACEMENT and left stone removal;  Surgeon: Gaston Hamilton, MD;  Location: [facility];  Service: Urology;  Laterality: Bilateral;   CYSTOSCOPY/URETEROSCOPY/HOLMIUM LASER/STENT PLACEMENT Right 03/30/2023   Procedure: CYSTOSCOPY, RIGHT RETROGRADE PYELOGRAM, RIGHT URETEROSCOPY, HOLMIUM LASER LITHOTRIPSY, AND RIGHT URETERAL STENT EXCHANGE;  Surgeon: Elisabeth Valli BIRCH, MD;  Location: [facility];  Service: Urology;  Laterality: Right;  60 MINUTES   EYE SURGERY Left    cataract removal   FRACTURE SURGERY Left 06/23/1987   ankle   THYROIDECTOMY N/A 03/03/2013   Procedure: TOTAL THYROIDECTOMY;  Surgeon: Oneil DELENA Budge, MD;  Location: [facility];  Service: General;  Laterality: N/A;   TONSILLECTOMY     TOTAL HIP ARTHROPLASTY Right 07/28/2021   TOTAL HIP ARTHROPLASTY Left 09/08/2022   Procedure: TOTAL HIP ARTHROPLASTY ANTERIOR APPROACH;  Surgeon: Ernie Cough, MD;  Location: [facility];  Service: Orthopedics;  Laterality: Left;   Social History   Socioeconomic History   Marital status: Married    Spouse name: Not on file   Number of children: Not on file   Years of education: Not on file   Highest education level:  Associate degree: occupational, scientist, product/process development, or vocational program  Occupational History   Not on file  Tobacco Use   Smoking status: Former    Current packs/day: 0.00    Average packs/day: 0.5 packs/day for 10.0 years (5.0 ttl pk-yrs)    Types: Cigarettes    Quit date: 02/19/2024    Years since quitting: 0.3   Smokeless tobacco: Former  Building Services Engineer status: Never Used  Substance and Sexual Activity   Alcohol use: Yes    Comment: occasional   Drug use: No   Sexual activity: Yes    Birth control/protection: None  Other Topics Concern   Not on file  Social History Narrative   Works outside   Social Drivers of Health   Tobacco Use: Medium Risk (06/21/2024)   Patient History    Smoking Tobacco Use: Former    Smokeless Tobacco Use: Former    Passive Exposure: Not on Actuary Strain: Low Risk (11/29/2023)   Overall Financial Resource Strain (CARDIA)    Difficulty of Paying Living Expenses: Not hard at all  Food Insecurity: No Food Insecurity (11/29/2023)   Hunger Vital Sign    Worried About Running Out of Food in the Last Year: Never true    Ran Out of Food in the Last Year: Never true  Transportation Needs: No Transportation Needs (11/29/2023)   PRAPARE - Administrator, Civil Service (Medical): No  Lack of Transportation (Non-Medical): No  Physical Activity: Unknown (11/29/2023)   Exercise Vital Sign    Days of Exercise per Week: 0 days    Minutes of Exercise per Session: Not on file  Stress: No Stress Concern Present (11/29/2023)   [employer] of Occupational Health - Occupational Stress Questionnaire    Feeling of Stress : Only a little  Social Connections: Moderately Integrated (11/29/2023)   Social Connection and Isolation Panel    Frequency of Communication with Friends and Family: More than three times a week    Frequency of Social Gatherings with Friends and Family: More than three times a week    Attends Religious Services: 1 to 4  times per year    Active Member of [employer] or Organizations: No    Attends Banker Meetings: Not on file    Marital Status: Married  Depression (PHQ2-9): Low Risk (06/09/2024)   Depression (PHQ2-9)    PHQ-2 Score: 0  Alcohol Screen: Low Risk (11/29/2023)   Alcohol Screen    Last Alcohol Screening Score (AUDIT): 4  Housing: Low Risk (11/29/2023)   Housing Stability Vital Sign    Unable to Pay for Housing in the Last Year: No    Number of Times Moved in the Last Year: 0    Homeless in the Last Year: No  Utilities: Not At Risk (02/25/2023)   [facility] Utilities    Threatened with loss of utilities: No  Recent Concern: Utilities - At Risk (02/23/2023)   Received from [facility] Utilities    Threatened with loss of utilities: Yes  Health Literacy: Not on file   Outpatient Encounter Medications as of 06/21/2024  Medication Sig   Accu-Chek Softclix Lancets lancets Test BS morning, noon and at bedtime Dx E11.65   amLODipine  (NORVASC ) 5 MG tablet Take 1 tablet (5 mg total) by mouth 2 (two) times daily.   aspirin  EC 81 MG tablet Take 81 mg by mouth daily.   Blood Glucose Monitoring Suppl DEVI 1 each by Does not apply route in the morning, at noon, and at bedtime. May substitute to any manufacturer covered by patient's insurance.   carvedilol  (COREG ) 25 MG tablet TAKE 1 TABLET (25 MG TOTAL) BY MOUTH TWICE A DAY WITH MEALS   celecoxib (CELEBREX) 200 MG capsule Take 200 mg by mouth 2 (two) times daily.   colchicine  0.6 MG tablet TAKE 2 TABLETS AT ONSET, THEN 1 TABLET 2 HOURS LATER- A MAXIMUM OF 3 TABLETS IN 24 HOURS (Patient taking differently: Take 0.6-1.2 mg by mouth See admin instructions. TAKE 1.2 mg (2 TABLETS) by mouth AT ONSET OF GOUT FLARE, THEN 0.6 mg (1 TABLET) 2 HOURS LATER AS NEEDED- A MAXIMUM OF 1.8 mg (3 TABLETS IN 24 HOURS))   cyanocobalamin  (VITAMIN B12) 1000 MCG tablet Take 1 tablet (1,000 mcg total) by mouth daily.   ferrous sulfate  325 (65 FE) MG tablet TAKE 325 MG BY  MOUTH DAILY.   glucose blood (ACCU-CHEK GUIDE) test strip Test BS morning, noon and at bedtime Dx E11.65   icosapent  Ethyl (VASCEPA ) 1 g capsule Take 1 capsule (1 g total) by mouth 2 (two) times daily.   levothyroxine  (SYNTHROID ) 175 MCG tablet TAKE 1 TABLET BY MOUTH EVERY DAY   magnesium  oxide (MAG-OX) 400 MG tablet Take by mouth.   meloxicam  (MOBIC ) 15 MG tablet TAKE 1 TABLET (15 MG TOTAL) BY MOUTH DAILY.   metFORMIN  (GLUCOPHAGE ) 1000 MG tablet TAKE 1 TABLET (1,000 MG TOTAL) BY  MOUTH TWICE A DAY WITH FOOD   mupirocin  ointment (BACTROBAN ) 2 % Apply 1 Application topically 2 (two) times daily as needed (as directed for irritation- affected areas).   pantoprazole  (PROTONIX ) 40 MG tablet Take 1 tablet (40 mg total) by mouth daily.   pravastatin  (PRAVACHOL ) 40 MG tablet TAKE 1 TABLET BY MOUTH EVERY DAY   sacubitril -valsartan  (ENTRESTO ) 97-103 MG Take 1 tablet by mouth 2 (two) times daily.   tadalafil (CIALIS) 5 MG tablet Take 5 mg by mouth daily.   TURMERIC CURCUMIN PO Take 1,000 mg by mouth 2 (two) times daily.   TYLENOL  500 MG tablet Take 1,000 mg by mouth every 6 (six) hours as needed for mild pain or headache.   No facility-administered encounter medications on file as of 06/21/2024.   ALLERGIES: Allergies  Allergen Reactions   Isosorbide Other (See Comments)    Lowers the blood pressure too much   VACCINATION STATUS: Immunization History  Administered Date(s) Administered   Influenza-Unspecified 05/20/2000   Janssen (J&J) SARS-COV-2 Vaccination 04/03/2020   Td 04/08/1999   Tdap 05/05/2020    HPI  48 yr old male with medical history as above . He underwent total thyroidectomy for thyroid  cancer on 03/03/2013 by Dr. Mavis at [facility] in [place] .   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 no lymph nodes were identified.  He received his first Thyrogen  stimulated I -131 thyroid   remnant ablation on 05/05/13 same, with no evidence of distant metastasis. -Subsequent Thyrogen  Stimulated  whole body scan in January 2017  was reported to be unremarkable for tumor recurrence/distant metastases.    -On March 17, 2021, he underwent Thyrogen  stimulated whole-body scan which was unremarkable. His most recent thyroglobulin and thyroglobulin bodies on July 30, 2023 were undetectable. He presents with repeat thyroid  function test consistent with appropriate suppressive treatment.    He remains on levothyroxine  175 mcg p.o. daily before breakfast.   - He also has hypertension, type 2 diabetes, currently on metformin  and glipizide.  He did not tolerate Jardiance  which he has discontinued after receiving the skin rash.  His point-of-care A1c 8.4% increasing from 7.5%.    He does not monitor blood glucose regularly.  He did not bring any meter nor logs. His recent labs showed hypogonadism, wishes to avoid testosterone  for now.     Review of Systems  Review of systems  Constitutional: + Progressively gaining weight,  current  Body mass index is 40.89 kg/m. , no fatigue, no subjective hyperthermia, no subjective hypothermia    Objective:    BP 125/87   Pulse 75   Resp 18   Ht 5' 10 (1.778 m)   Wt 285 lb (129.3 kg)   SpO2 98%   BMI 40.89 kg/m   Wt Readings from Last 3 Encounters:  06/21/24 285 lb (129.3 kg)  06/09/24 294 lb 6.4 oz (133.5 kg)  03/24/24 275 lb (124.7 kg)       Diabetic Labs (most recent): Lab Results  Component Value Date   HGBA1C 8.4 (A) 06/21/2024   HGBA1C 7.5 (H) 06/09/2024   HGBA1C 7.2 (A) 12/03/2023   MICROALBUR 29.5 02/12/2020    Lipid Panel     Component Value Date/Time   CHOL 181 06/09/2024 1506   TRIG 230 (H) 06/09/2024 1506   HDL 32 (L) 06/09/2024 1506   CHOLHDL 5.7 (H) 06/09/2024 1506   LDLCALC 109 (H) 06/09/2024 1506   LABVLDL 40 06/09/2024  1506      Latest Ref Rng & Units 06/09/2024    3:06 PM 06/09/2024    2:52  PM 03/24/2024    5:57 PM  CMP  Glucose 70 - 99 mg/dL 864   705   BUN 6 - 24 mg/dL 10   12   Creatinine 9.23 - 1.27 mg/dL 9.05   9.10   Sodium 865 - 144 mmol/L 141   140   Potassium 3.5 - 5.2 mmol/L 4.2   3.6   Chloride 96 - 106 mmol/L 105   105   CO2 20 - 29 mmol/L 22   24   Calcium  8.7 - 10.2 mg/dL 9.5   8.8   Total Protein 6.0 - 8.5 g/dL 7.3   6.6   Total Bilirubin 0.0 - 1.2 mg/dL 0.4   0.2   Alkaline Phos 47 - 123 IU/L 97   84   AST 0 - 40 IU/L 23  24  12    ALT 0 - 44 IU/L 44  44  17     Assessment & Plan:    1. Postsurgical hypothyroidism  His recent thyroid  function tests are consistent with appropriate suppressive treatment.  He is advised to continue levothyroxine  175 mcg p.o. daily before breakfast.     - We discussed about the correct intake of his thyroid  hormone, on empty stomach at fasting, with water , separated by at least 30 minutes from breakfast and other medications,  and separated by more than 4 hours from calcium , iron , multivitamins, acid reflux medications (PPIs). -Patient is made aware of the fact that thyroid  hormone replacement is needed for life, dose to be adjusted by periodic monitoring of thyroid  function tests.    3. Malignant neoplasm of thyroid  gland (HCC)  -  03/03/2013:  Status post  near total thyroidectomy for stage 1 multifocal FVPTC. pT1NxMx . 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 - After his surgery, he received  of I-131 remnant ablation on 05/05/13,  post therapy whole body scar showing no evidence of distant metastasis.  - surveillance ultrasound of the neck/thyroid  in February 2016 was negative for thyroid  remnants.  - whole-body scan with Thyrogen  was negative for tumor recurrence on 07/05/2015.  -Another surveillance  thyroid /neck ultrasound on 06/09/2016 is remarkable for  surgically absent thyroid .  - on October 04, 2017 he underwent another surveillance Thyrogen  stimulated  whole-body scan with negative findings for tumor recurrence or distant metastasis. -His previous labs show thyroglobulin level undetectable (< 2) -Previsit surveillance thyroid /neck ultrasound negative for  residual thyroid  tissue or recurrence or lymphadenopathy.  -September 2022 Thyrogen  stimulated whole-body scan is unremarkable and favorable for tumor remission.   -His previsit labs continue to show undetectable thyroglobulin and thyroglobulin antibodies.  He will not be considered for imaging studies at this time.     3.  Type 2 diabetes: Point-of-care A1c at 8.4%.  He did not tolerate Jardiance   which he thought caused skin rash.  He was offered injectable GLP-1 receptor agonist, however patient declined this option for now.  He is advised to continue metformin  1000 mg p.o. twice daily, glipizide 5 mg XL p.o. daily at breakfast.   I had a long discussion with this patient on potential complications of uncontrolled diabetes.  His diabetes so far is complicated by obesity/sedentary life and patient remains at exceedingly high risk for complications including  CAD, CVA, CKD, retinopathy, and neuropathy. These are all discussed in detail with him.  - I have counseled him on diet management and weight loss, by adopting whole food plant strong diet. - he acknowledges that there is a room for improvement in his food and drink choices. - Suggestion is made for him to avoid simple carbohydrates  from his diet including Cakes, Sweet Desserts, Ice Cream, Soda (diet and regular), Sweet Tea, Candies, Chips, Cookies, Store Bought Juices, Alcohol in Excess of  1-2 drinks a day, Artificial Sweeteners,  Coffee Creamer, and Sugar-free Products, Lemonade. This will help patient to have more stable blood glucose profile and potentially avoid unintended weight gain.  - I encouraged him to switch to   unprocessed or minimally processed complex starch and increased protein intake (animal or plant source), fruits, and vegetables. The GLP-1 receptor agonist would have been helpful to address weight management.  He will be approached again on subsequent visits.  4.  Hypertension: His blood pressure is controlled to target.    -He is currently on amlodipine  10 Respaire daily, carvedilol  25 mg p.o. twice daily, Entresto  97-103 mg p.o. twice daily.  5) hyperlipidemia-his most recent lipid panel showed slight improvement in LDL at 109.  He is advised to continue pravastatin  40 mg p.o. nightly.     Admittedly, he has been hesitant to take this medication based on his research of side effects.  he absolutely needs it unless his engagement with whole food plant-based diet is optimal.     6) hypogonadism: His recent labs show total testosterone  of 205, improving . he is not a suitable candidate for androgen replacement therapy at this time given his history of sleep apnea, hyperlipidemia.  He wishes to delay any consideration of androgen replacement therapy  until he works on his lifestyle.   - Patient specific target  A1c;  LDL, HDL, Triglycerides, and  Waist Circumference were discussed in detail.   - I advised patient to maintain close follow up with Tyler [name], Tyler HERO, FNP for primary care needs.   I spent  40  minutes in the care of the patient today including review of labs from Thyroid  Function, CMP, and other relevant labs ; imaging/biopsy records (current and previous including abstractions from other facilities); face-to-face time discussing  his lab results and symptoms, medications doses, his options of short and long term treatment based on the latest standards of care / guidelines;   and documenting the encounter.  Tyler [name]  participated in the discussions, expressed understanding, and voiced agreement with the above plans.  All questions were answered to his satisfaction. he is  encouraged to contact clinic should he have any questions or concerns prior to his return visit.     Follow up plan: Return in about 4 months (around 10/19/2024) for A1c -NV, Fasting Labs  in AM B4 8.  Ranny Earl, MD Phone: [phone]  Fax: [phone]  This note was partially dictated with voice recognition software. Similar sounding words can be transcribed inadequately or may not  be corrected upon review.  06/21/2024, 5:16 PM "

## 2024-06-21 NOTE — Patient Instructions (Signed)

## 2024-07-01 ENCOUNTER — Other Ambulatory Visit: Payer: Self-pay | Admitting: Family Medicine

## 2024-07-01 DIAGNOSIS — E119 Type 2 diabetes mellitus without complications: Secondary | ICD-10-CM

## 2024-07-19 ENCOUNTER — Other Ambulatory Visit: Payer: Self-pay | Admitting: "Endocrinology

## 2024-07-26 ENCOUNTER — Other Ambulatory Visit: Payer: Self-pay | Admitting: "Endocrinology

## 2024-07-26 DIAGNOSIS — C73 Malignant neoplasm of thyroid gland: Secondary | ICD-10-CM

## 2024-08-18 ENCOUNTER — Inpatient Hospital Stay: Attending: Hematology

## 2024-08-25 ENCOUNTER — Inpatient Hospital Stay: Attending: Hematology | Admitting: Oncology

## 2024-10-20 ENCOUNTER — Ambulatory Visit: Admitting: "Endocrinology

## 2025-06-12 ENCOUNTER — Encounter: Admitting: Family Medicine
